# Patient Record
Sex: Female | Born: 1980
Health system: Southern US, Community
[De-identification: ages and names within clinical notes are randomized; demographics above are authoritative.]

## PROBLEM LIST (undated history)

## (undated) ENCOUNTER — Inpatient Hospital Stay (HOSPITAL_COMMUNITY): Payer: Self-pay

## (undated) DIAGNOSIS — I10 Essential (primary) hypertension: Secondary | ICD-10-CM

## (undated) DIAGNOSIS — E079 Disorder of thyroid, unspecified: Secondary | ICD-10-CM

## (undated) DIAGNOSIS — D219 Benign neoplasm of connective and other soft tissue, unspecified: Secondary | ICD-10-CM

## (undated) DIAGNOSIS — F410 Panic disorder [episodic paroxysmal anxiety] without agoraphobia: Secondary | ICD-10-CM

## (undated) DIAGNOSIS — A4902 Methicillin resistant Staphylococcus aureus infection, unspecified site: Secondary | ICD-10-CM

## (undated) DIAGNOSIS — Z87442 Personal history of urinary calculi: Secondary | ICD-10-CM

## (undated) DIAGNOSIS — E282 Polycystic ovarian syndrome: Secondary | ICD-10-CM

## (undated) DIAGNOSIS — E785 Hyperlipidemia, unspecified: Secondary | ICD-10-CM

## (undated) DIAGNOSIS — F32A Depression, unspecified: Secondary | ICD-10-CM

## (undated) DIAGNOSIS — K219 Gastro-esophageal reflux disease without esophagitis: Secondary | ICD-10-CM

## (undated) HISTORY — DX: Hyperlipidemia, unspecified: E78.5

## (undated) HISTORY — PX: HYSTEROSCOPY: SHX211

## (undated) HISTORY — PX: PARATHYROIDECTOMY: SHX19

## (undated) HISTORY — DX: Depression, unspecified: F32.A

## (undated) HISTORY — DX: Disorder of thyroid, unspecified: E07.9

---

## 1999-04-11 ENCOUNTER — Inpatient Hospital Stay (HOSPITAL_COMMUNITY): Admission: AD | Admit: 1999-04-11 | Discharge: 1999-04-11 | Payer: Self-pay | Admitting: *Deleted

## 2000-10-26 ENCOUNTER — Encounter: Payer: Self-pay | Admitting: Family Medicine

## 2000-10-26 ENCOUNTER — Encounter: Admission: RE | Admit: 2000-10-26 | Discharge: 2000-10-26 | Payer: Self-pay | Admitting: Family Medicine

## 2001-11-26 ENCOUNTER — Other Ambulatory Visit: Admission: RE | Admit: 2001-11-26 | Discharge: 2001-11-26 | Payer: Self-pay | Admitting: Family Medicine

## 2002-05-14 ENCOUNTER — Emergency Department (HOSPITAL_COMMUNITY): Admission: EM | Admit: 2002-05-14 | Discharge: 2002-05-14 | Payer: Self-pay | Admitting: Emergency Medicine

## 2002-11-12 ENCOUNTER — Emergency Department (HOSPITAL_COMMUNITY): Admission: EM | Admit: 2002-11-12 | Discharge: 2002-11-12 | Payer: Self-pay

## 2002-11-12 ENCOUNTER — Encounter: Payer: Self-pay | Admitting: Emergency Medicine

## 2003-04-01 ENCOUNTER — Encounter: Admission: RE | Admit: 2003-04-01 | Discharge: 2003-04-01 | Payer: Self-pay | Admitting: Family Medicine

## 2003-04-01 ENCOUNTER — Encounter: Payer: Self-pay | Admitting: Family Medicine

## 2003-04-25 ENCOUNTER — Emergency Department (HOSPITAL_COMMUNITY): Admission: EM | Admit: 2003-04-25 | Discharge: 2003-04-25 | Payer: Self-pay | Admitting: Emergency Medicine

## 2004-06-21 ENCOUNTER — Emergency Department (HOSPITAL_COMMUNITY): Admission: EM | Admit: 2004-06-21 | Discharge: 2004-06-21 | Payer: Self-pay | Admitting: Emergency Medicine

## 2005-09-13 ENCOUNTER — Emergency Department (HOSPITAL_COMMUNITY): Admission: EM | Admit: 2005-09-13 | Discharge: 2005-09-13 | Payer: Self-pay | Admitting: Family Medicine

## 2006-05-22 ENCOUNTER — Emergency Department (HOSPITAL_COMMUNITY): Admission: EM | Admit: 2006-05-22 | Discharge: 2006-05-22 | Payer: Self-pay | Admitting: Family Medicine

## 2006-06-27 ENCOUNTER — Emergency Department (HOSPITAL_COMMUNITY): Admission: EM | Admit: 2006-06-27 | Discharge: 2006-06-27 | Payer: Self-pay | Admitting: Family Medicine

## 2006-09-01 ENCOUNTER — Emergency Department (HOSPITAL_COMMUNITY): Admission: EM | Admit: 2006-09-01 | Discharge: 2006-09-01 | Payer: Self-pay | Admitting: Family Medicine

## 2006-10-05 ENCOUNTER — Emergency Department (HOSPITAL_COMMUNITY): Admission: EM | Admit: 2006-10-05 | Discharge: 2006-10-05 | Payer: Self-pay | Admitting: Emergency Medicine

## 2007-02-03 ENCOUNTER — Emergency Department (HOSPITAL_COMMUNITY): Admission: EM | Admit: 2007-02-03 | Discharge: 2007-02-03 | Payer: Self-pay | Admitting: Family Medicine

## 2007-04-18 ENCOUNTER — Ambulatory Visit (HOSPITAL_COMMUNITY): Admission: RE | Admit: 2007-04-18 | Discharge: 2007-04-18 | Payer: Self-pay | Admitting: Emergency Medicine

## 2007-04-18 ENCOUNTER — Emergency Department (HOSPITAL_COMMUNITY): Admission: EM | Admit: 2007-04-18 | Discharge: 2007-04-18 | Payer: Self-pay | Admitting: Emergency Medicine

## 2007-09-29 ENCOUNTER — Emergency Department (HOSPITAL_COMMUNITY): Admission: EM | Admit: 2007-09-29 | Discharge: 2007-09-29 | Payer: Self-pay | Admitting: Family Medicine

## 2007-10-21 ENCOUNTER — Ambulatory Visit (HOSPITAL_COMMUNITY): Admission: EM | Admit: 2007-10-21 | Discharge: 2007-10-23 | Payer: Self-pay | Admitting: Family Medicine

## 2007-10-22 ENCOUNTER — Encounter (HOSPITAL_BASED_OUTPATIENT_CLINIC_OR_DEPARTMENT_OTHER): Payer: Self-pay | Admitting: Internal Medicine

## 2008-03-07 ENCOUNTER — Inpatient Hospital Stay (HOSPITAL_COMMUNITY): Admission: AD | Admit: 2008-03-07 | Discharge: 2008-03-07 | Payer: Self-pay | Admitting: Gynecology

## 2008-04-02 ENCOUNTER — Emergency Department (HOSPITAL_COMMUNITY): Admission: EM | Admit: 2008-04-02 | Discharge: 2008-04-02 | Payer: Self-pay | Admitting: Emergency Medicine

## 2008-06-26 ENCOUNTER — Encounter: Admission: RE | Admit: 2008-06-26 | Discharge: 2008-06-26 | Payer: Self-pay | Admitting: Internal Medicine

## 2008-09-15 ENCOUNTER — Emergency Department (HOSPITAL_COMMUNITY): Admission: EM | Admit: 2008-09-15 | Discharge: 2008-09-15 | Payer: Self-pay | Admitting: Emergency Medicine

## 2008-10-14 ENCOUNTER — Encounter: Payer: Self-pay | Admitting: Gynecology

## 2008-10-14 ENCOUNTER — Other Ambulatory Visit: Admission: RE | Admit: 2008-10-14 | Discharge: 2008-10-14 | Payer: Self-pay | Admitting: Gynecology

## 2008-10-14 ENCOUNTER — Ambulatory Visit: Payer: Self-pay | Admitting: Gynecology

## 2008-10-16 ENCOUNTER — Ambulatory Visit: Payer: Self-pay | Admitting: Gynecology

## 2008-10-22 ENCOUNTER — Ambulatory Visit: Payer: Self-pay | Admitting: Gynecology

## 2008-11-13 ENCOUNTER — Ambulatory Visit (HOSPITAL_COMMUNITY): Admission: RE | Admit: 2008-11-13 | Discharge: 2008-11-13 | Payer: Self-pay | Admitting: Gynecology

## 2008-11-20 ENCOUNTER — Ambulatory Visit: Payer: Self-pay | Admitting: Gynecology

## 2008-12-14 ENCOUNTER — Ambulatory Visit: Payer: Self-pay | Admitting: Gynecology

## 2009-01-06 ENCOUNTER — Ambulatory Visit: Payer: Self-pay | Admitting: Gynecology

## 2009-01-14 ENCOUNTER — Ambulatory Visit: Payer: Self-pay | Admitting: Gynecology

## 2009-02-19 ENCOUNTER — Emergency Department (HOSPITAL_COMMUNITY): Admission: EM | Admit: 2009-02-19 | Discharge: 2009-02-19 | Payer: Self-pay | Admitting: Emergency Medicine

## 2009-02-28 ENCOUNTER — Ambulatory Visit: Payer: Self-pay | Admitting: Gynecology

## 2009-03-01 ENCOUNTER — Encounter (INDEPENDENT_AMBULATORY_CARE_PROVIDER_SITE_OTHER): Payer: Self-pay | Admitting: Obstetrics and Gynecology

## 2009-03-01 ENCOUNTER — Ambulatory Visit: Payer: Self-pay | Admitting: Gynecology

## 2009-03-01 ENCOUNTER — Ambulatory Visit (HOSPITAL_COMMUNITY): Admission: RE | Admit: 2009-03-01 | Discharge: 2009-03-01 | Payer: Self-pay | Admitting: Obstetrics and Gynecology

## 2009-06-09 ENCOUNTER — Emergency Department (HOSPITAL_COMMUNITY): Admission: EM | Admit: 2009-06-09 | Discharge: 2009-06-09 | Payer: Self-pay | Admitting: Emergency Medicine

## 2010-04-18 ENCOUNTER — Telehealth (INDEPENDENT_AMBULATORY_CARE_PROVIDER_SITE_OTHER): Payer: Self-pay

## 2010-05-27 ENCOUNTER — Emergency Department (HOSPITAL_COMMUNITY): Admission: EM | Admit: 2010-05-27 | Discharge: 2010-05-27 | Payer: Self-pay | Admitting: Family Medicine

## 2010-06-18 IMAGING — CT CT CERVICAL SPINE W/O CM
2 series · 10 of 14 positions shown, 12 images · non-contrast
Comparison: None

CLINICAL DATA: Tingling in both hands

CT CERVICAL SPINE WITHOUT CONTRAST
TECHNIQUE: Multidetector CT imaging of the cervical spine was
performed. Multiplanar CT image reconstructions were also
generated.

[Series 3: c_spine 2.0 b31s · axial · 0.34mm/px · z∈[-218,-100]mm · 5 of 89 slices shown]
[im 15/89  bone]
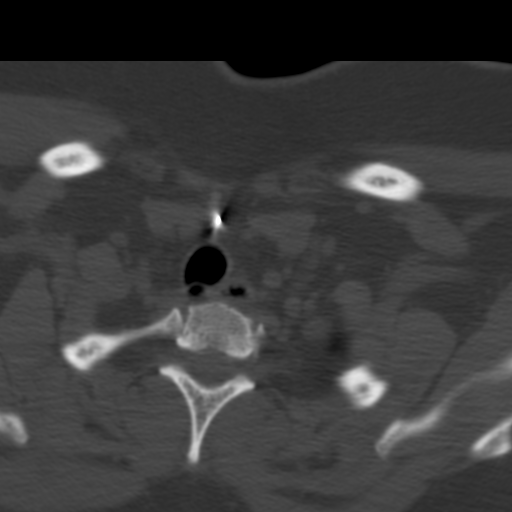
[im 30/89  bone]
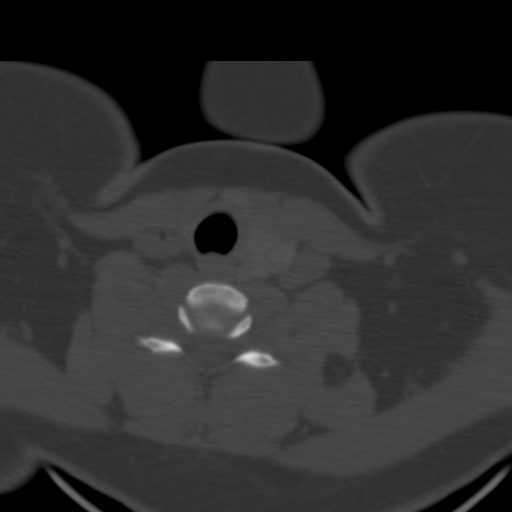
[im 45/89  bone]
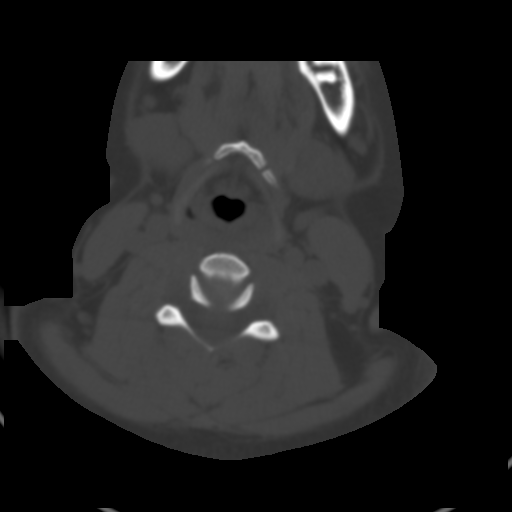
[im 59/89  bone]
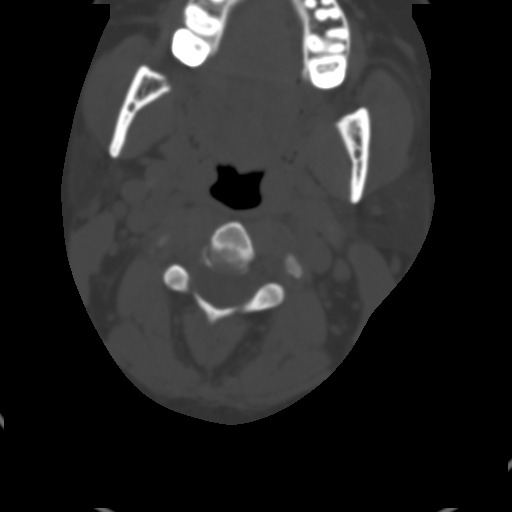
[im 74/89  bone]
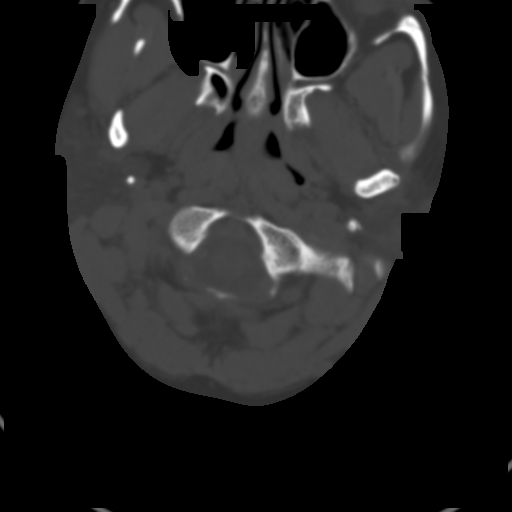

[Series 602: <mpr thick range> · axial · 0.35mm/px · z∈[-259,-136]mm · 5 of 97 slices shown, 7 images]
[im 17/97  soft-tissue]
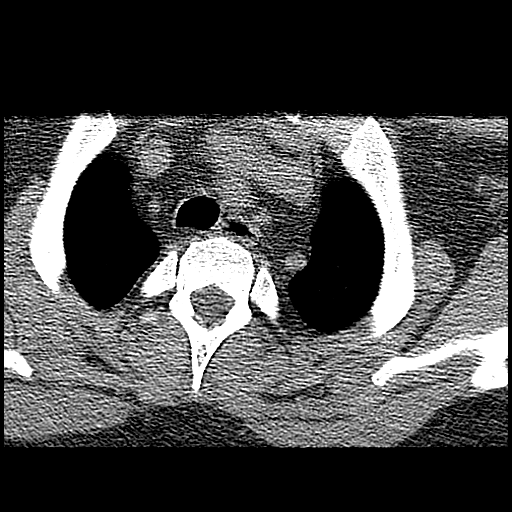
[im 17/97  bone]
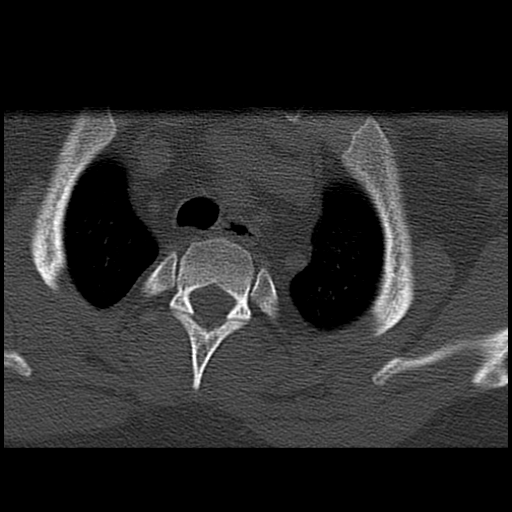
[im 33/97  bone]
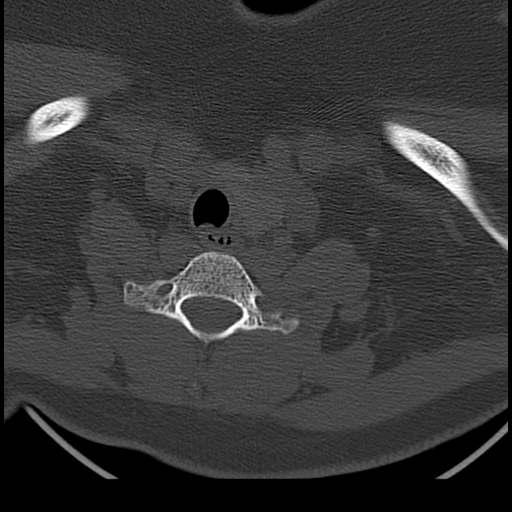
[im 49/97  bone]
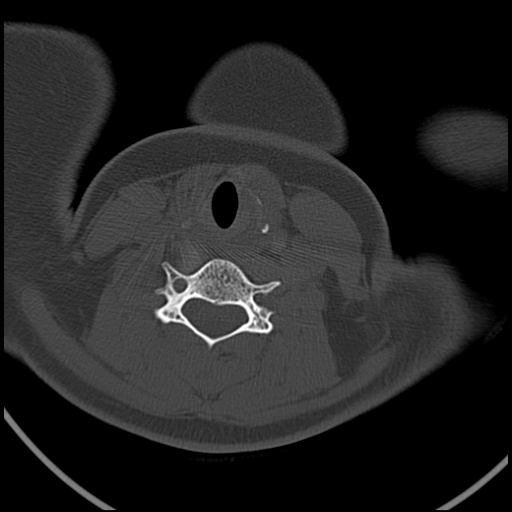
[im 65/97  bone]
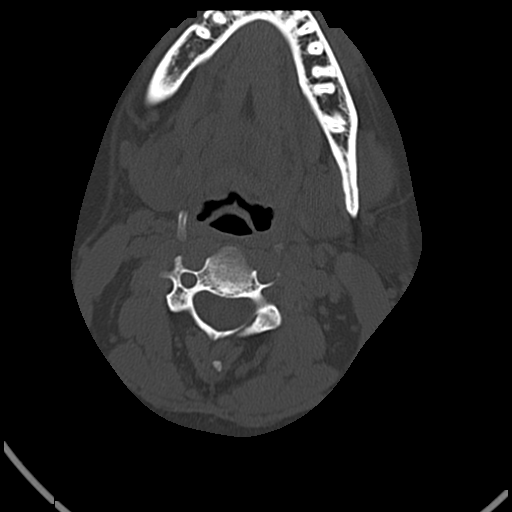
[im 81/97  soft-tissue]
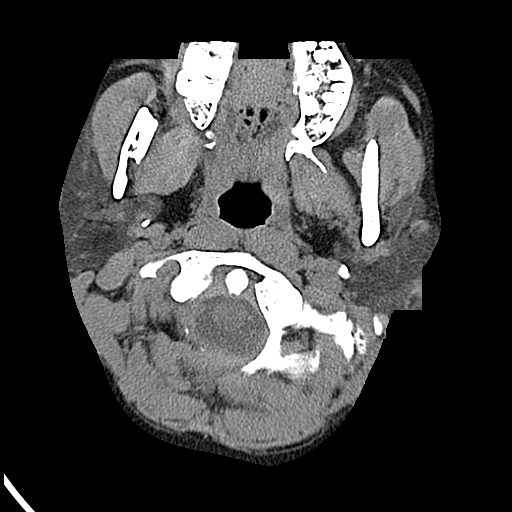
[im 81/97  bone]
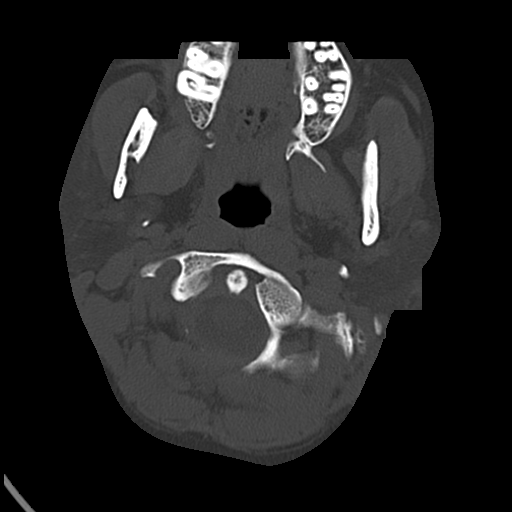

[10 of 14 positions shown; findings below may reference images not displayed]

FINDINGS: There is reversal of the normal cervical lordotic curve.
No fractures or subluxations.  No obvious acute disc herniation.
The prevertebral soft tissues normal except for prior thyroid
surgery.
IMPRESSION: No acute or significant findings.

## 2010-10-18 NOTE — Progress Notes (Signed)
Summary: triage  Phone Note Call from Patient Call back at [phone] (home)   [phone] (cell)   Caller: Patient Call For: Dr. Russella Dar (doctor of the day) Reason for Call: Talk to Nurse Summary of Call: left side abd pain... only wants to see Amy... no GI hx... try cell first, ok to leave a msg Initial call taken by: Vallarie Mare,  April 18, 2010 11:59 AM  Follow-up for Phone Call        Patient  c/o LUQ pain.  patient requesting to only see Amy Esterwood PA .  I explained that patient can see Mike Gip PA the first time, but will then be scheduled with an MD for follow up appointments and that [facility] PA will not be her permanent provider here Follow-up by: Darcey Nora RN, CGRN,  April 18, 2010 12:10 PM

## 2010-12-01 LAB — URINE CULTURE
Colony Count: 15000
Culture  Setup Time: 201109100115

## 2010-12-01 LAB — POCT URINALYSIS DIPSTICK
Glucose, UA: NEGATIVE mg/dL
Ketones, ur: NEGATIVE mg/dL
Specific Gravity, Urine: 1.025 (ref 1.005–1.030)
Urobilinogen, UA: 1 mg/dL (ref 0.0–1.0)

## 2010-12-01 LAB — POCT PREGNANCY, URINE: Preg Test, Ur: POSITIVE

## 2010-12-23 LAB — URINALYSIS, ROUTINE W REFLEX MICROSCOPIC
Bilirubin Urine: NEGATIVE
Glucose, UA: NEGATIVE mg/dL
Hgb urine dipstick: NEGATIVE
Ketones, ur: NEGATIVE mg/dL
Protein, ur: NEGATIVE mg/dL
pH: 7 (ref 5.0–8.0)

## 2010-12-23 LAB — POCT I-STAT, CHEM 8
BUN: 7 mg/dL (ref 6–23)
Calcium, Ion: 1.23 mmol/L (ref 1.12–1.32)
Chloride: 106 mEq/L (ref 96–112)
Creatinine, Ser: 0.8 mg/dL (ref 0.4–1.2)
Glucose, Bld: 100 mg/dL — ABNORMAL HIGH (ref 70–99)
TCO2: 26 mmol/L (ref 0–100)

## 2010-12-26 LAB — URINALYSIS, ROUTINE W REFLEX MICROSCOPIC
Glucose, UA: NEGATIVE mg/dL
Hgb urine dipstick: NEGATIVE
Ketones, ur: NEGATIVE mg/dL
Protein, ur: NEGATIVE mg/dL
pH: 7.5 (ref 5.0–8.0)

## 2010-12-26 LAB — POCT PREGNANCY, URINE: Preg Test, Ur: NEGATIVE

## 2010-12-26 LAB — CBC
HCT: 39 % (ref 36.0–46.0)
Hemoglobin: 13.4 g/dL (ref 12.0–15.0)
MCHC: 34.3 g/dL (ref 30.0–36.0)
RBC: 4.73 MIL/uL (ref 3.87–5.11)
RDW: 14.1 % (ref 11.5–15.5)

## 2010-12-27 ENCOUNTER — Inpatient Hospital Stay (HOSPITAL_COMMUNITY)
Admission: AD | Admit: 2010-12-27 | Discharge: 2010-12-30 | DRG: 651 | Disposition: A | Discharge status: Home / Self Care | Payer: Federal, State, Local not specified - PPO | Source: Ambulatory Visit | Attending: Obstetrics and Gynecology | Admitting: Obstetrics and Gynecology

## 2010-12-27 DIAGNOSIS — I10 Essential (primary) hypertension: Secondary | ICD-10-CM | POA: Diagnosis present

## 2010-12-27 DIAGNOSIS — IMO0002 Reserved for concepts with insufficient information to code with codable children: Principal | ICD-10-CM | POA: Diagnosis present

## 2010-12-27 LAB — CBC
HCT: 33.3 % — ABNORMAL LOW (ref 36.0–46.0)
MCH: 27.6 pg (ref 26.0–34.0)
MCV: 81.4 fL (ref 78.0–100.0)
Platelets: 181 10*3/uL (ref 150–400)
RBC: 4.09 MIL/uL (ref 3.87–5.11)
WBC: 10.6 10*3/uL — ABNORMAL HIGH (ref 4.0–10.5)

## 2010-12-27 LAB — COMPREHENSIVE METABOLIC PANEL
Alkaline Phosphatase: 89 U/L (ref 39–117)
BUN: 4 mg/dL — ABNORMAL LOW (ref 6–23)
Chloride: 107 mEq/L (ref 96–112)
Creatinine, Ser: 0.51 mg/dL (ref 0.4–1.2)
GFR calc non Af Amer: >60 mL/min (ref >60)
Glucose, Bld: 87 mg/dL (ref 70–99)
Potassium: 3.7 mEq/L (ref 3.5–5.1)
Total Bilirubin: 0.5 mg/dL (ref 0.3–1.2)

## 2010-12-27 LAB — LACTATE DEHYDROGENASE: LDH: 152 U/L (ref 94–250)

## 2010-12-27 LAB — URIC ACID: Uric Acid, Serum: 5.1 mg/dL (ref 2.4–7.0)

## 2010-12-28 ENCOUNTER — Other Ambulatory Visit: Payer: Self-pay | Admitting: Obstetrics and Gynecology

## 2010-12-28 LAB — RPR: RPR Ser Ql: NONREACTIVE

## 2010-12-28 LAB — CBC
MCH: 27.7 pg (ref 26.0–34.0)
MCHC: 34.1 g/dL (ref 30.0–36.0)
Platelets: 181 10*3/uL (ref 150–400)
RDW: 13.7 % (ref 11.5–15.5)

## 2010-12-29 LAB — CBC
MCV: 82.4 fL (ref 78.0–100.0)
Platelets: 186 10*3/uL (ref 150–400)
RBC: 3.8 MIL/uL — ABNORMAL LOW (ref 3.87–5.11)
RDW: 13.8 % (ref 11.5–15.5)
WBC: 9.8 10*3/uL (ref 4.0–10.5)

## 2010-12-30 ENCOUNTER — Encounter (HOSPITAL_COMMUNITY)
Admission: RE | Admit: 2010-12-30 | Discharge: 2010-12-30 | Disposition: A | Discharge status: Home / Self Care | Payer: Federal, State, Local not specified - PPO | Source: Ambulatory Visit | Attending: Obstetrics and Gynecology | Admitting: Obstetrics and Gynecology

## 2010-12-30 DIAGNOSIS — O923 Agalactia: Secondary | ICD-10-CM | POA: Insufficient documentation

## 2011-01-02 ENCOUNTER — Ambulatory Visit (HOSPITAL_COMMUNITY)
Admission: RE | Admit: 2011-01-02 | Discharge: 2011-01-02 | Disposition: A | Discharge status: Home / Self Care | Payer: Federal, State, Local not specified - PPO | Source: Ambulatory Visit | Attending: Obstetrics and Gynecology | Admitting: Obstetrics and Gynecology

## 2011-01-02 DIAGNOSIS — O923 Agalactia: Secondary | ICD-10-CM | POA: Insufficient documentation

## 2011-01-04 ENCOUNTER — Inpatient Hospital Stay (HOSPITAL_COMMUNITY)
Admission: AD | Admit: 2011-01-04 | Discharge: 2011-01-05 | Disposition: A | Discharge status: Home / Self Care | Payer: Federal, State, Local not specified - PPO | Source: Ambulatory Visit | Attending: Obstetrics and Gynecology | Admitting: Obstetrics and Gynecology

## 2011-01-04 DIAGNOSIS — O9989 Other specified diseases and conditions complicating pregnancy, childbirth and the puerperium: Secondary | ICD-10-CM

## 2011-01-04 DIAGNOSIS — R51 Headache: Secondary | ICD-10-CM

## 2011-01-04 DIAGNOSIS — O99893 Other specified diseases and conditions complicating puerperium: Secondary | ICD-10-CM

## 2011-01-04 LAB — URINE MICROSCOPIC-ADD ON

## 2011-01-04 LAB — CBC
HCT: 33.7 % — ABNORMAL LOW (ref 36.0–46.0)
Hemoglobin: 11.3 g/dL — ABNORMAL LOW (ref 12.0–15.0)
MCV: 81.2 fL (ref 78.0–100.0)
RBC: 4.15 MIL/uL (ref 3.87–5.11)
RDW: 13 % (ref 11.5–15.5)
WBC: 8.8 10*3/uL (ref 4.0–10.5)

## 2011-01-04 LAB — URINALYSIS, ROUTINE W REFLEX MICROSCOPIC
Bilirubin Urine: NEGATIVE
Nitrite: NEGATIVE
Specific Gravity, Urine: 1.01 (ref 1.005–1.030)
Urobilinogen, UA: 0.2 mg/dL (ref 0.0–1.0)

## 2011-01-04 NOTE — H&P (Signed)
  Debbie Walter, Debbie Walter                ACCOUNT NO.:  [id number]  MEDICAL RECORD NO.:  [id number]           PATIENT TYPE:  I  LOCATION:  9169                          [facility]ACILITY[facility]  [facility]  PHYSICIAN:  Dineen Kid. Rana Snare, M.D.    DATE OF BIRTH:  09-05-1981  DATE OF ADMISSION:  12/27/2010 DATE OF DISCHARGE:                             HISTORY & PHYSICAL   HISTORY OF PRESENT ILLNESS:  Ms. Mable is a 30 year old G3, A2, P0.  She is 24 and [redacted] weeks  gestational age based on the [facility] of January 15, 2011. She was seen by my partner Dr. Marcelle Overlie today for a routine obstetrical care.  Pregnancy has been complicated by elevated blood pressures including chronic hypertension currently on methyldopa, but has recently had worsening increased blood pressures.  Today in the office, blood pressure 170/100, recheck was 158/98.  She also has 1+ protein in the urine which is only for her.  We planned to admit her for induction of labor due to preeclampsia versus most likely superimposed preeclampsia on top of chronic hypertension.  She had an ultrasound today showing a vertex fetus with a biophysical profile 8/8.  AFI was 90th percentile with an AFI of 23.2.  estimated fetal weight was 7.9 which is 85th percentile.  Again, her blood pressure 170/100.  Fetal heart rates in the 140s.  Abdomen is 37-cm.  Cervix is fingertip, 50%, - 2.  Reflexes are 204 bilaterally with trace edema.  PAST MEDICAL HISTORY:  History of hyperparathyroidism, status post hyperparathyroid removal, history of chronic hypertension currently on methyldopa, history of panic attacks.  MEDICATIONS:  Currently, she is on Protonix 40 mg daily.  She is also on metformin at 500 mg a day.  She is on methyldopa 250 mg p.o. b.i.d.  She is on prenatal vitamins, vitamin D and calcium.  Pregnancy care unremarkable.  She did have routine ultrasounds which were normal including normal first trimester screen.  She is group B strep positive and she has no  known drug allergies.  IMPRESSION AND PLAN:  Intrauterine pregnancy at 37-1/7 weeks' with chronic hypertension with superimposed preeclampsia.  We will check preeclampsia labs and begin induction of labor.  Plan two-stage induction with antibiotics for group B strep positive.  Anticipate spontaneous vaginal delivery.     Dineen Kid Rana Snare, M.D.     DCL/MEDQ  D:  12/27/2010  T:  12/28/2010  Job:  [id number]  Electronically Signed by Candice Camp M.D. on 01/04/2011 01:25:34 PM

## 2011-01-05 LAB — COMPREHENSIVE METABOLIC PANEL
ALT: 28 U/L (ref 0–35)
Alkaline Phosphatase: 72 U/L (ref 39–117)
BUN: 11 mg/dL (ref 6–23)
CO2: 26 mEq/L (ref 19–32)
Chloride: 105 mEq/L (ref 96–112)
Glucose, Bld: 102 mg/dL — ABNORMAL HIGH (ref 70–99)
Potassium: 3.9 mEq/L (ref 3.5–5.1)
Sodium: 140 mEq/L (ref 135–145)
Total Bilirubin: 0.4 mg/dL (ref 0.3–1.2)
Total Protein: 6.4 g/dL (ref 6.0–8.3)

## 2011-01-05 LAB — LACTATE DEHYDROGENASE: LDH: 226 U/L (ref 94–250)

## 2011-01-05 NOTE — Op Note (Signed)
  Debbie Walter, Debbie Walter                ACCOUNT NO.:  [id number]  MEDICAL RECORD NO.:  [id number]           PATIENT TYPE:  I  LOCATION:  9147                          [facility]  [facility]  PHYSICIAN:  Crimson Beer L. Cassity Christian, M.D.DATE OF BIRTH:  02-Aug-1981  DATE OF PROCEDURE:  12/28/2010 DATE OF DISCHARGE:                              OPERATIVE REPORT   PREOPERATIVE DIAGNOSES: 1. Intrauterine pregnancy at 38 plus weeks. 2. Chronic hypertension. 3. Failure to progress.  POSTOPERATIVE DIAGNOSES: 1. Intrauterine pregnancy at 38 plus weeks. 2. Chronic hypertension. 3. Failure to progress.  PROCEDURE:  Primary low transverse cesarean section.  SURGEON:  Norwood Quezada L. Kamerin Axford, MD  ANESTHESIA:  Epidural.  FINDINGS:  Female infant, cephalic presentation in transverse position, Apgars 9 at 1 minute and 9 at 5 minutes.  ESTIMATED BLOOD LOSS:  Less than 500 mL.  PATHOLOGY:  None.  DRAINS:  Foley.  COMPLICATIONS:  None.  DESCRIPTION OF PROCEDURE:  The patient was taken to the operating room. Her epidural was dosed.  She was then prepped and draped.  A Foley catheter had been inserted while in Labor and Delivery.  A low transverse incision was made, carried down to the fascia.  The fascia was scored in the midline and extended laterally.  The rectus muscles were separated in the midline.  The peritoneum was entered bluntly.  The peritoneal incision was then stretched.  The lower uterine segment was identified.  The bladder flap was created sharply and then digitally. The bladder blade was then readjusted.  A low transverse incision was made in the uterus.  Uterus was entered using a hemostat.  Amniotic fluid was clear.  The baby was in transverse position and was delivered easily with a female infant, Apgars 9 at 1 minute and 9 at 5 minutes. The cord was clamped and cut.  The baby was handed to the awaiting pediatricians and the uterus was exteriorized and cleared of all clots and debris after  the placenta was manually removed and noted to be normal, intact with a 3-vessel cord.  The uterus was closed in 1 layer using 0 chromic in a running locked stitch.  Hemostasis was excellent. The uterus was returned to the abdomen.  Irrigation was performed. Hemostasis was excellent.  The peritoneum and rectus muscles were reapproximated using 0 Vicryl.  The fascia was closed using 0 Vicryl, starting each corner and meeting in the midline.  After irrigation of subcutaneous layer, the skin was closed with a 4-0 Vicryl on a Keith needle.  Dermabond and local was infiltrated.  All sponge, lap, and instrument counts were correct x2.  The patient went to recovery room in stable condition.     Jahmeir Geisen L. Vincente Poli, M.D.     Florestine Avers  D:  12/28/2010  T:  12/29/2010  Job:  [id number]  Electronically Signed by Marcelle Overlie M.D. on 01/05/2011 06:18:23 PM

## 2011-01-15 ENCOUNTER — Inpatient Hospital Stay (HOSPITAL_COMMUNITY): Admission: AD | Admit: 2011-01-15 | Payer: Self-pay | Admitting: Obstetrics and Gynecology

## 2011-01-25 NOTE — Discharge Summary (Signed)
Debbie Walter, VERNE                ACCOUNT NO.:  [id number]  MEDICAL RECORD NO.:  [id number]           PATIENT TYPE:  LOCATION:                                 FACILITY:  PHYSICIAN:  Duke Salvia. Marcelle Overlie, M.D.DATE OF BIRTH:  September 27, 1980  DATE OF ADMISSION:  12/28/2010 DATE OF DISCHARGE:  12/30/2010                              DISCHARGE SUMMARY   ADMITTING DIAGNOSIS: 1. Intrauterine pregnancy at 37-2/7 weeks' estimated gestational age. 2. Two-stage induction of labor secondary to chronic hypertension.  DISCHARGE DIAGNOSIS:  1. Status post low transverse cesarean section secondary to failed induction 2. viable female infant.  PROCEDURE:  Primary low transverse cesarean section.  REASON FOR ADMISSION:  Please see written H and P.  HOSPITAL COURSE:  The patient is a 30 year old African American married female, gravida 3, para 0 that was admitted to [facility] for two-stage induction of labor.  The patient did have known chronic hypertension and over the last several weeks blood pressures were noted to be elevated.  The patient was now admitted for Cytotec and Pitocin augmentation of her labor.  After several hours with no change in cervix, decision was made to proceed with a primary low transverse cesarean section.  The patient was then taken to the operating room where epidural was dosed to an adequate surgical level.  A low transverse incision was made with delivery of a viable female infant, weighing 7 pounds 5 ounces with Apgars of 9 at 1 minute and 9 at 5 minutes.  The patient tolerated the procedure well and taken to the recovery room in stable condition.  On postoperative day #1, the patient was without complaint.  She denied headache or blurred vision.  Vital signs were stable.  She is afebrile.  Blood pressure is 135/89 and 145/82, baseline blood pressure is 140/92.  Deep tendon reflexes are 2+. No clonus.  Abdomen soft.  Fundus, firm and nontender.   Incision was clean, dry, and intact with Dermabond closure.  Foley had been discontinued and she is voiding well.  Laboratory findings showed hemoglobin of 10.4 and platelet count of 186,000.  On postoperative day #2, the patient desired early discharge.  Vital signs were stable. Fundus, firm and nontender.  Incision was clean, dry, and intact. Discharge instructions were reviewed and the patient was later discharged home.  CONDITION ON DISCHARGE:  Stable.  DIET:  Regular as tolerated.  ACTIVITY:  No heavy lifting, no driving x2 weeks, no vaginal entry.  FOLLOWUP:  The patient is to follow up in the office in 1-2 weeks for an incision check.  She is to call for temperature greater than 100 degrees, persistent nausea, vomiting, heavy vaginal bleeding, and/or redness or drainage from the incisional site.  DISCHARGE MEDICATIONS: 1. Tylox, #30, one p.o. q.4-6 h. p.r.n. 2. Motrin 600 mg every 6 hours. 3. Prenatal vitamins 1 p.o. daily. 4. Aldomet 250 mg one p.o. b.i.d.     Julio Sicks, N.P.   ______________________________ Duke Salvia. Marcelle Overlie, M.D.    CC/MEDQ  D:  12/30/2010  T:  12/30/2010  Job:  [id number]  Electronically Signed by Julio Sicks  N.P. on 01/04/2011 11:06:55 AM Electronically Signed by Richarda Overlie M.D. on 01/25/2011 09:25:34 AM

## 2011-01-31 NOTE — Op Note (Signed)
NAMEMARA, FAVERO                ACCOUNT NO.:  [id number]   MEDICAL RECORD NO.:  [id number]          PATIENT TYPE:  AMB   LOCATION:  SDC                           [facility]  [facility]   PHYSICIAN:  Juan H. Lily Peer, M.D.DATE OF BIRTH:  09/25/1980   DATE OF PROCEDURE:  DATE OF DISCHARGE:  03/01/2009                               OPERATIVE REPORT   SURGEON:  Juan H. Lily Peer, MD   PREOPERATIVE DIAGNOSES:  1. Endometrial polyps.  2. Secondary infertility.   ANESTHESIA:  General endotracheal anesthesia.   PROCEDURES PERFORMED:  Resectoscopic polypectomy and resectoscopic  myomectomy.   FINDINGS:  The patient with multiple submucous myomas and polyps, both  tubal ostia were identified after distending the uterus with 3%  sorbitol, endocervical canal unremarkable.   INDICATIONS FOR OPERATION:  A 30 year old gravida 1, para 0, AB 1 with a  history of secondary infertility and workup had demonstrated evidence of  endometrial polyps and submucous myoma.  Case had been discussed with  reproductive endocrinologist, Dr. Fermin Schwab at a Reproductive  Endocrinology at [facility] and the patient was scheduled to  undergo endometrial resectoscopic polypectomy and submucous myomectomy  by Dr. Reynaldo Minium and HSG and [facility] catheterization, both fallopian  tube by Dr. April Manson, which will be in a separate dictation.   DESCRIPTION OF OPERATION:  After the patient was adequately counseled,  she was taken to the operating room where she underwent a successful  general endotracheal anesthesia.  She had received a gram of cefotetan  for prophylaxis.  Her vagina and perineum were prepped and draped in the  usual sterile fashion.  Of note, a laminaria that had been placed  intracervically the day before was removed in an effort to facilitate  insertion of the operative resectoscope.  A speculum was placed into the  vagina.  A single-tooth tenaculum was placed into the anterior  cervical  lip.  The cervix required no cervical dilatation.  The uterus sounded to  approximately 6.5 cm.  The Lendell Caprice operative resectoscope with a 90-  degree wire loop was inserted into the intrauterine cavity and 3%  sorbitol as a distending media.  In the systematic fashion, I inspected  the entire cavity, multiple polyps and a few submucous myomas were  noted.  The [place] electrosurgical generator was set at 80 watts from  the cutting mode and 80 watts from the coagulation mode, and  systematically the submucous myomas and polyps were resected, and this  was interchanged with vigorous curetting as well as the suction curette  to clear the cavity of the endometrial polyps and submucosa myomas.  This allowed for Dr. April Manson to proceed with the [facility] catheterization  of both fallopian tube under fluoroscopy and HSG to determine patency of  the fallopian tubes, please see separate dictation.  The patient  tolerated the procedure well.  Blood loss was less than 20 mL and fluid  deficit was less than 1000 mL,  although several 100 mL were on the operating room floor.  The patient  was extubated, transferred to recovery room with stable vital signs.  Blood loss was minimal and fluid resuscitation consisted of 1600 mL of  lactated Ringer's.  She did receive a gram of cefotetan for prophylaxis.      Juan H. Lily Peer, M.D.  Electronically Signed     JHF/MEDQ  D:  03/01/2009  T:  03/02/2009  Job:  [id number]

## 2011-01-31 NOTE — H&P (Signed)
Debbie Walter, Debbie Walter                ACCOUNT NO.:  [id number]   MEDICAL RECORD NO.:  [id number]          PATIENT TYPE:  INP   LOCATION:  6526                         FACILITY:  [facility]   PHYSICIAN:  Barry Dienes. Eloise Harman, M.D.DATE OF BIRTH:  1981/08/05   DATE OF ADMISSION:  10/21/2007  DATE OF DISCHARGE:  10/23/2007                              HISTORY & PHYSICAL   CHIEF COMPLAINT:  Palpitations and I have C difficile.   HISTORY OF PRESENT ILLNESS:  The patient is a 30 year old African  American woman who is well-known to me.  She has no history of cardiac  or pulmonary disease.  She has a history of hypertension, panic attacks,  gastroesophageal reflux disease, and partial  parathyroidectomy.   She was in her usual state of good health until approximately 5 p.m.  yesterday when she developed fever with a gradual onset of tachycardia  and anxiety.  She also complained of frequent loose bowel movement since  yesterday afternoon with substernal chest tightness and shortness of  breath.  Today she continues to feel somewhat fatigued, but she no  longer has shortness of breath.  Of note, she had amoxicillin treatment  for presumed sinusitis prescribed by an urgent care center approximately  3 weeks ago.   PAST MEDICAL HISTORY:  1. Hypertension.  2. Partial parathyroidectomy in 1997 for primary hyperparathyroidism.  3. Gastroesophageal reflux disease associated with atypical chest      pain.  4. Panic disorder.  5. Common migraine headaches.   MEDICATIONS:  Prior to admission:  1. Toprol XL 50 mg p.o. daily.  2. Multivitamin 1 tablet daily.  3. Tums 2 tablets p.o. daily.  4. Vitamin D one tablet p.o. daily.  5. Colace 200 mg p.o. daily p.r.n. constipation.  6. Protonix 40 mg p.o. daily.  7. Fish oil one cap p.o. daily.  8. Folate 1 mg p.o. daily   ALLERGIES AND MEDICATION INTOLERANCES:  ZITHROMAX has been associated  with GI upset.   PAST SURGICAL HISTORY:  1997, partial  parathyroidectomy.   SOCIAL HISTORY:  She is married (to [place]).  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  She has no history of tobacco or alcohol  abuse.   FAMILY HISTORY:  Her father is age 23 and has hypertension and has had  colon cancer.  Her mother is age 29 and has diabetes mellitus, type 2  with premature coronary artery disease and hypertension.  She has a  sister age 75.  She has no children.  There is a family history of close  relatives with premature coronary artery disease, diabetes mellitus type  2, and colon cancer, but no close relatives with breast cancer.   REVIEW OF SYSTEMS:  She has had intermittent fevers for the past 24  hours with loose bowel movements.  She has not had a productive cough or  dysuria or frequency or arthralgias.  Her initial moderate anxiety has  decreased now.  Initially she had perioral and fingertip numbness  bilaterally that has resolved.  She denies substernal chest pain.   PHYSICAL EXAMINATION:  VITAL SIGNS:  Blood pressure 134/72, pulse 142,  respirations 18, temperature 100.2.  Currently her pulse is 107 with  telemetry showing sinus tachycardia.  Pulse oxygen saturation was 100%  on room air.  GENERAL:  In general, she is a well-nourished, well-developed black  female who is in no apparent distress.  HEENT:  Within normal limits.  NECK:  Supple without jugular venous distention or carotid bruits.  CHEST:  Clear to auscultation.  HEART:  Regular rate and rhythm without significant murmur or gallop.  ABDOMEN:  Abdomen had normal bowel sounds and no hepatosplenomegaly or  tenderness.  NEUROLOGIC:  She is alert and oriented x3 with a normal affect and a  nonfocal neurologic exam.   LABORATORY STUDIES:  Fibrin degradation products 0.8 (less than 0.48).  Troponin I was less than 0.05.  Urinalysis was normal.  Serum sodium  141, potassium 3.6, chloride 112, BUN 13, creatinine 0.8, glucose 125.  Urine drug screen was  normal.  EKG showed the following:  Sinus  tachycardia, right axis deviation, nonspecific T-wave abnormalities.  Hemoglobin 15, hematocrit 45.  White blood cell count was pending at the  time of dictation.   IMPRESSION AND PLAN:  1. Sinus tachycardia:  This is most likely secondary to dehydration      from diarrhea and fever, and appears to be exacerbated by a panic      attack.  I doubt that her rapid heart rate is due to      hyperthyroidism or left ventricular systolic dysfunction.  A CT      scan with angiography of the chest showed no evidence of pulmonary      embolism so this is unlikely.  I plan to continue IV fluids at a      moderate dose today and obtain a transthoracic echocardiogram to      assess LV function.  We will recheck cardiac isoenzymes in the      morning.  2. Fever and diarrhea:  This may be due to early Clostridium difficile      colitis.  There has been an epidemic of viral gastroenteritis in      the community.  I plan to check stool specimens for Clostridium      difficile x2.  We will check a CBC in the a.m. and if the white      blood cell count is elevated, consider and empiric treatment for      Clostridium difficile.  3. Anterior mediastinal fullness seen on CT scan of the chest:      Unclear etiology and could be due to substernal goiter or thymus      remnant.  Lymphoma is possible but somewhat less likely.   When the CT images are available, we will review the images and official  report.  She may need a follow-up CT scan to evaluate for interval  change.           ______________________________  Barry Dienes. Eloise Harman, M.D.     DGP/MEDQ  D:  10/22/2007  T:  10/23/2007  Job:  [id number]

## 2011-01-31 NOTE — Op Note (Signed)
Debbie Walter, Debbie Walter                ACCOUNT NO.:  [id number]   MEDICAL RECORD NO.:  [id number]          PATIENT TYPE:  AMB   LOCATION:  SDC                           [facility]   PHYSICIAN:  Fermin Schwab, MD   DATE OF BIRTH:  1981-08-26   DATE OF PROCEDURE:  03/01/2009  DATE OF DISCHARGE:  03/01/2009                               OPERATIVE REPORT   PREOPERATIVE DIAGNOSES:  1. Endometrial polyps.  2. Submucosal myoma.  3. Bilateral proximal tubal occlusion.   POSTOPERATIVE DIAGNOSES:  1. Endometrial polyps.  2. Submucosal myoma.  3. Bilateral proximal tubal occlusion.   PROCEDURE:  1. Hysteroscopic resection of polyps and myomas, dilation and      curettage, performed by Dr. Reynaldo Minium.  2. Hysteroscopy, Novy catheterization of bilateral fallopian tubes,      intraoperative hysterosalpingography, performed by Dr. April Manson.   ANESTHESIA:  General.   COMPLICATIONS:  None.   ESTIMATED BLOOD LOSS:  20 mL.   FINDINGS:  Numerous endometrial polyps filled the endometrial cavity.  There was also an approximately 1 x 2 cm submucosal myoma, probably  located at the 3 o'clock position.  This part was dictated by Dr.  Lily Peer.  After the D and C, endometrial cavity could be discerned  better.  Both tubal ostia were visualized and were nonpatent.  Initial  intraoperative hysterosalpingogram revealed bilateral tubal occlusion  even following the D and C with selective tubal cannulation.  Both  fallopian tubes opacified on intraoperative HSG.  However, postprocedure  transcervical injection of contrast still revealed a nonpatency of the  bilateral proximal portions of the tube although this may have been due  to artifact.   DESCRIPTION OF PROCEDURE:  The first part of the procedure was performed  by Dr. Lily Peer and was dictated separately.  After removal of polyps  and myomas and suction and sharp D and C, an operative hysteroscope was  inserted into the endometrial  cavity using sorbitol as distention  medium.  The cavity was almost normal with some fluffy tissue versus  residual submucosal myoma or polyp apparent at the 3 o'clock position.  The cervix was clamped to achieve a better seal around 5 mm operative  hysteroscope.  The Novy catheter was directed into the right tubal  ostium and wedged into it.  Selective salpingography performed with  contrast medium did not show any opacification of the right tube.  Next,  a cornual access catheter with a guidewire was passed into the proximal  segment of the tube, and this was confirmed with intraoperative HSG,  since inability to maintain uterine distention, was not making it  possible to monitor the proper passage of the catheter into the tube via  the hysteroscope.  The guidewire was pulled out and contrast solution  was injected showing the right tubal patency.  The same steps were  repeated for the proximal portion of the left tube after selective  salpingography attempt on this side failed to show opacification of the  tube.  After tubal catheterization, the left tube also showed filling  and spillage.  The catheter  was pulled out.  Using a pediatric Foley  catheter with a balloon wedged into the lower uterine segment, a  transcervical HSG was repeated showing a sluggish fill or non-fill of  the bilateral tubes status post catheterization.  This may have been due  to an artifact.  The procedure was terminated.  Estimated blood loss was  20 mL.  The hysteroscopic sorbitol deficit was about 700 mL.  The  patient tolerated the procedure well and was transferred to recovery  room in satisfactory condition.  She will continue her oral  contraceptives and we will start clomiphene 50 mg, and she will come  back for her postop check and a transvaginal ultrasound in 12 days.      Fermin Schwab, MD  Electronically Signed     TY/MEDQ  D:  03/03/2009  T:  03/03/2009  Job:  [id number]   cc:   Gaetano Hawthorne.  Lily Peer, M.D.  Fax: [phone]

## 2011-01-31 NOTE — H&P (Signed)
Debbie Walter, Debbie Walter                ACCOUNT NO.:  [id number]   MEDICAL RECORD NO.:  [id number]          PATIENT TYPE:  EMS   LOCATION:  ED                           FACILITY:  [facility]   PHYSICIAN:  Juan H. Lily Peer, M.D.DATE OF BIRTH:  1981/07/24   DATE OF ADMISSION:  02/19/2009  DATE OF DISCHARGE:  02/19/2009                              HISTORY & PHYSICAL   The patient was scheduled for surgery on Monday at 11:00 a.m. at [facility].  Please have history and physical available.   CHIEF COMPLAINT:  1. Endometrial polyps.  2. Secondary infertility.   HISTORY:  The patient is a 30 year old gravida 1, para 0, AB 1 who has  had an extensive evaluation for her secondary infertility.  Her workup  has included normal prolactin, normal TSH, normal fasting blood sugar,  normal fasting lipid profile, total testosterone slightly in upper  limits of normal.  This had been done as a result of her hirsutism/PCO.  The DHEAS was normal as was her insulin level and 17-  hydroxyprogesterone.  Her vitamin D level was slightly low at 23.  She  has past history of partial correction of parathyroidectomy in 1997.  She was recently placed on 50,000 units vitamin D weekly for 12 weeks.  Her husband's semen analysis had been normal with the exception of being  extremely viscous.  She had a hysterosalpingogram which demonstrated  numerous fixed filling defects seen in the intrauterine cavity  compressing most of the endometrial cavity.  Neither fallopian tube  filled because of the filling defects in the both cornual regions.  She  was seen in the office on January 06, 2009, where by the ultrasound  demonstrated a uterus measuring 8.4 x 5.6 x 4.9 cm and a posterior myoma  measuring 22 x 14 mm.  Endometrium was thick with numerous echogenic  defects with positive color Doppler.  Right and left ovaries were with  several follicles, prolactin appearance consistent with PCO.  The  sonohysterogram  demonstrated numerous echogenic defects with positive  color flow with the following measuring 13 mm, 14 mm, 17 mm, 15 mm, and  6 mm.  Anterior to the myometrium, was a thickness of the wall and  defect measuring 5 mm and posterior 6 mm.  The case had been discussed  with Dr. Fermin Schwab, head of Reproductive Endocrinology at [facility], and he recommended to proceed with resectoscopic  polypectomy and possible submucous myomectomy, and he would concurrently  do bilateral intraoperative fluoroscopy to see if we can recanalize the  tubes and reestablish the patency.  He had put her on oral contraceptive  pill for the month prior to the procedure and when she would be on the  placebo part of the oral contraceptive pill, we would be doing the  procedure.  He had also had placed her on metformin which she had  difficulty tolerating.   PAST MEDICAL HISTORY:  She has had one prior miscarriage.  She also has  history of hypertension for which she is taking metoprolol and  gastroesophageal reflux for which she  is taking Protonix.  She is taking  calcium with vitamin D and vitamin B12.  She had history of  parathyroidectomy in 1997.   PHYSICAL EXAMINATION:  VITAL SIGNS:  The patient weighs 186 pounds, she  is 5 feet 1 inch tall, blood pressure 134/90.  HEENT:  Unremarkable.  NECK:  Supple.  Trachea midline.  No carotid bruits or thyromegaly.  LUNGS:  Clear to auscultation without rhonchi or wheezes.  HEART:  Regular rate and rhythm.  No murmurs or gallops.  BREASTS:  Unremarkable, done at the time of her annual exam in January  2010.  ABDOMEN:  Pendulous.  PELVIC:  Bartholin, urethra, Skene glands within normal limits.  Vagina  and cervix, no gross lesions on inspection.  Uterus, upper limits of  normal.  No palpable mass or tenderness.  RECTAL:  Deferred.   ASSESSMENT:  A 30 year old gravida 1, para 0, AB 1 with secondary  infertility numerous intracavitary defect such as  submucous myoma and  possible polyps and nonpatent tubes on hysterosalpingography.  The  patient is scheduled to undergo resectoscopic polypectomy, possible  submucous myomectomy along with bilateral intraoperative fluoroscopy.  The case will be done by Dr. Reynaldo Minium and Dr. Fermin Schwab.  The risks, benefits, and pros and cons of the operation were discussed  with the patient to include infection and she will receive prophylaxis  antibiotics.  The risk for uterine perforation or extravasation of the  staining media fluid contribute to pulmonary edema and hyponatremia were  discussed with the patient.  She wished to have a laminaria placed  intracervically the day prior to her surgery to facilitate insertion of  the operative resectoscope.  The patient fully understands all the  above.  All questions were answered and will follow accordingly.  The  patient scheduled for hysteroscopy D&C, resectoscopic myomectomy, and  polypectomy along with bilateral intraoperative fluoroscopy on Monday,  March 01, 2009, at 11:00 a.m. at [facility]  Please have history  and physical available.      Juan H. Lily Peer, M.D.  Electronically Signed     JHF/MEDQ  D:  02/26/2009  T:  02/27/2009  Job:  [id number]

## 2011-02-03 NOTE — Discharge Summary (Signed)
NAMEKOURTNEI, RAUBER                ACCOUNT NO.:  [id number]   MEDICAL RECORD NO.:  [id number]          PATIENT TYPE:  OIB   LOCATION:  6526                         FACILITY:  [facility]   PHYSICIAN:  Barry Dienes. Eloise Harman, M.D.DATE OF BIRTH:  12-02-1980   DATE OF ADMISSION:  10/21/2007  DATE OF DISCHARGE:  10/23/2007                               DISCHARGE SUMMARY   PERTINENT FINDINGS:  The patient is a 30 year old African American woman  who is well-known to me.  She she has no history of cardiac or pulmonary  disease.  She has a history of hypertension, panic attacks,  gastroesophageal reflux disease, and a partial parathyroidectomy.  She  was in her usual state of good health until approximately 5:00 p.m.  yesterday when she developed fever with a gradual onset of tachycardia  and anxiety.  She also had frequent loose bowel movements and mild  substernal chest tightness and shortness of breath.  She was seen in the  emergency room with an extensive workup and started on IV fluids and  anti-emetics.  At the time of my evaluation.  She felt somewhat  fatigued, but no longer had shortness of breath.  Of note, she had had  amoxicillin treatment for presumed sinusitis for side prescribed by an  urgent care center approximately 3 weeks prior to hospital admission.   MEDICATIONS PRIOR TO ADMISSION:  1. Toprol XL 50 mg daily.  2. Multivitamin 1 tablet daily.  3. Tums 2 tablets p.o. daily.  4. Vitamin B iron 1 tablet p.o. daily.  5. Colace 200 mg daily p.r.n. constipation.  6. Protonix 40 mg p.o. daily.  7. Fish oil 1 tablet p.o. daily, folate 1 mg p.o. daily.   ALLERGIES/MEDICATION INTOLERANCES:  Zithromax has been associated with  GI upset.   PHYSICAL EXAMINATION:  VITAL SIGNS:  Blood pressure 134/72, pulse 142,  respirations 18, temperature 100.2.  At the time of my evaluation.  Her  pulse was 107 with telemetry showed sinus tachycardia.  Pulse oxygen  saturation was 100% on room air. In  general she is a well-nourished,  well-developed black female who is in no apparent distress.  HEENT: Exam was within normal limits.  NECK:  Neck was supple without jugular venous distention or carotid  bruit.  CHEST: Clear to auscultation.  Heart had a regular rate and rhythm  without significant murmur or gallop.  ABDOMEN: Normal bowel sounds and no hepatosplenomegaly or tenderness.  NEUROLOGICAL EXAM:  She is alert and oriented x3 and had a normal  affect.  A nonfocal neurologic exam.   LABORATORY STUDIES:  Serum sodium 141, potassium 3.6, chloride 112, BUN  13, creatinine 0.8, glucose 125.  Urine drug screen was normal. Fibrin  degradation products was 0.8 (normal is less than 0.48), troponin-I was  less than 0.05.  Urinalysis was normal EKG showed the following:  Sinus  tachycardia, right axis deviation, nonspecific T-wave abnormalities.  Hemoglobin 15, hematocrit 45, white blood cell count pending.   HOSPITAL COURSE:  The patient was admitted to a medical bed with  telemetry.  She had a CT angiography of  a chest exam done for chest pain  with an elevated D-dimer.  This study showed no gross pulmonary  embolism.  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  However,  the contours were somewhat prominent for a typical residual thymus, so a  interval follow-up CT scan examination was recommended an roughly 6  months.  She had also had a transthoracic echocardiogram done to  evaluate her tachycardia.  The echocardiogram showed normal left  ventricular systolic function with left ventricular ejection fraction  between 60 and 70%.  There was systolic mitral bowing of the anterior  and posterior leaflets, but without diagnostic evidence for mitral valve  prolapse.  A stool test for C difficile was negative.  Gradually her  nausea and  tachycardia resolved entirely.   PROCEDURES:  CT angiogram of the chest and transthoracic echocardiogram.   CONSULTATIONS:  None.   CONDITION ON DISCHARGE:  Her diarrhea had resolved entirely.  She was  eating well with no abdominal pain.  She had no shortness of breath or  further palpitations.  Most recent vital signs include blood pressure  118/52, pulse 94, respirations 20, temperature 97.9, pulse oxygen  saturation 99% on room air.  Chest was clear to auscultation.  Heart had  a regular rate and rhythm.  Abdomen was benign and extremities were  without edema.  Most recent laboratory tests included troponin I 0.01,  serum sodium 137, potassium 3.7, chloride 114, carbon dioxide 19, BUN 2,  creatinine 0.61, glucose 86, calcium 7.8.  Stool C.  Difficile toxin  negative, TSH 1.4, white blood cells 5.7, hemoglobin 12, hematocrit 36,  platelets 224.   DISCHARGE DIAGNOSES:  1. Acute viral gastroenteritis.  2. Hypertension, essential, controlled.  3. Gastroesophageal reflux disease.  4. History of chronic constipation.  5. Osteoarthritis.  6. Anxiety.   DISCHARGE MEDICATIONS:  1. Toprol XL 50 mg p.o. daily.  2. Protonix 40 mg p.o. daily.  3. Colace 100 mg p.o. daily p.r.n. constipation.  4. Folate 1 mg p.o. daily.  5. Calcium carbonate 500 mg p.o. daily.  6. Tylenol 650 mg p.o. t.i.d. p.r.n. pain.  7. Xanax 0.25 mg p.o. b.i.d. p.r.n. anxiety, #20.  8. Imodium AD p.r.n. diarrhea, Kaopectate 1 tablespoon p.o. b.i.d. for      7 days.   DISPOSITION:  Follow-up:  The patient was discharged to home.  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  She was advised to have a follow-up appoint with Dr.  Jarome Matin in 7-14 days following discharge at [facility].           ______________________________  Barry Dienes Eloise Harman, M.D.     DGP/MEDQ  D:  11/27/2007  T:  11/29/2007  Job:  [id number]

## 2011-06-09 LAB — I-STAT 8, (EC8 V) (CONVERTED LAB)
Acid-base deficit: 5 — ABNORMAL HIGH
BUN: 13
Chloride: 112
HCT: 45
Potassium: 3.6
pH, Ven: 7.382 — ABNORMAL HIGH

## 2011-06-09 LAB — BASIC METABOLIC PANEL
BUN: 2 — ABNORMAL LOW
GFR calc Af Amer: >60
GFR calc non Af Amer: >60
Potassium: 3.7
Sodium: 137

## 2011-06-09 LAB — URINALYSIS, ROUTINE W REFLEX MICROSCOPIC
Bilirubin Urine: NEGATIVE
Glucose, UA: NEGATIVE
Ketones, ur: 15 — AB
pH: 8.5 — ABNORMAL HIGH

## 2011-06-09 LAB — CBC
HCT: 35.8 — ABNORMAL LOW
Hemoglobin: 12.4
Platelets: 224
RBC: 4.29
WBC: 5.7

## 2011-06-09 LAB — RAPID URINE DRUG SCREEN, HOSP PERFORMED
Amphetamines: NOT DETECTED
Benzodiazepines: NOT DETECTED
Cocaine: NOT DETECTED
Tetrahydrocannabinol: NOT DETECTED

## 2011-06-09 LAB — POCT CARDIAC MARKERS
Myoglobin, poc: 49.1
Operator id: 277751

## 2011-06-09 LAB — CLOSTRIDIUM DIFFICILE EIA
C difficile Toxins A+B, EIA: NEGATIVE
C difficile Toxins A+B, EIA: NEGATIVE

## 2011-06-09 LAB — T3, FREE: T3, Free: 3.1 (ref 2.3–4.2)

## 2011-06-09 LAB — POCT I-STAT CREATININE: Creatinine, Ser: 0.8

## 2011-06-15 LAB — URINE CULTURE

## 2011-06-15 LAB — URINALYSIS, ROUTINE W REFLEX MICROSCOPIC
Hgb urine dipstick: NEGATIVE
Protein, ur: NEGATIVE
Urobilinogen, UA: 0.2

## 2011-06-15 LAB — GC/CHLAMYDIA PROBE AMP, GENITAL: GC Probe Amp, Genital: NEGATIVE

## 2011-06-15 LAB — WET PREP, GENITAL
Trich, Wet Prep: NONE SEEN
Yeast Wet Prep HPF POC: NONE SEEN

## 2011-06-16 LAB — URINALYSIS, ROUTINE W REFLEX MICROSCOPIC
Glucose, UA: NEGATIVE
Hgb urine dipstick: NEGATIVE
Ketones, ur: NEGATIVE
Protein, ur: NEGATIVE

## 2011-06-16 LAB — PREGNANCY, URINE: Preg Test, Ur: NEGATIVE

## 2011-06-22 LAB — POCT I-STAT, CHEM 8
BUN: 11 mg/dL (ref 6–23)
Calcium, Ion: 1.21 mmol/L (ref 1.12–1.32)
Glucose, Bld: 98 mg/dL (ref 70–99)
TCO2: 20 mmol/L (ref 0–100)

## 2011-06-22 LAB — URINE MICROSCOPIC-ADD ON

## 2011-06-22 LAB — DIFFERENTIAL
Basophils Absolute: 0 10*3/uL (ref 0.0–0.1)
Eosinophils Relative: 0 % (ref 0–5)
Lymphocytes Relative: 16 % (ref 12–46)
Monocytes Absolute: 0.3 10*3/uL (ref 0.1–1.0)
Monocytes Relative: 3 % (ref 3–12)

## 2011-06-22 LAB — CBC
HCT: 38.5 % (ref 36.0–46.0)
Hemoglobin: 13 g/dL (ref 12.0–15.0)
MCHC: 33.7 g/dL (ref 30.0–36.0)
RDW: 14.1 % (ref 11.5–15.5)

## 2011-06-22 LAB — URINALYSIS, ROUTINE W REFLEX MICROSCOPIC
Bilirubin Urine: NEGATIVE
Leukocytes, UA: NEGATIVE
Nitrite: NEGATIVE
Specific Gravity, Urine: 1.009 (ref 1.005–1.030)
Urobilinogen, UA: 0.2 mg/dL (ref 0.0–1.0)
pH: 8.5 — ABNORMAL HIGH (ref 5.0–8.0)

## 2011-06-22 LAB — PREGNANCY, URINE: Preg Test, Ur: NEGATIVE

## 2011-06-22 LAB — POCT CARDIAC MARKERS

## 2011-07-03 LAB — CBC
HCT: 41.1
MCV: 82.9
Platelets: 340
RBC: 4.96
WBC: 8.9

## 2011-07-03 LAB — URINALYSIS, ROUTINE W REFLEX MICROSCOPIC
Bilirubin Urine: NEGATIVE
Glucose, UA: NEGATIVE
Ketones, ur: NEGATIVE
Protein, ur: NEGATIVE
pH: 7

## 2011-07-03 LAB — COMPREHENSIVE METABOLIC PANEL
AST: 20
Albumin: 4.1
Alkaline Phosphatase: 50
BUN: 9
CO2: 24
Chloride: 106
Creatinine, Ser: 0.64
GFR calc Af Amer: >60
GFR calc non Af Amer: >60
Potassium: 3.5
Total Bilirubin: 0.5

## 2011-07-03 LAB — URINE MICROSCOPIC-ADD ON

## 2011-07-03 LAB — DIFFERENTIAL
Basophils Absolute: 0.2 — ABNORMAL HIGH
Basophils Relative: 2 — ABNORMAL HIGH
Eosinophils Relative: 1
Lymphocytes Relative: 16
Monocytes Absolute: 0.4

## 2011-07-03 LAB — LIPASE, BLOOD: Lipase: 19

## 2011-09-27 ENCOUNTER — Encounter (HOSPITAL_COMMUNITY): Payer: Self-pay | Admitting: *Deleted

## 2011-09-27 ENCOUNTER — Emergency Department (HOSPITAL_COMMUNITY)
Admission: EM | Admit: 2011-09-27 | Discharge: 2011-09-27 | Disposition: A | Discharge status: Home / Self Care | Payer: Federal, State, Local not specified - PPO | Attending: Emergency Medicine | Admitting: Emergency Medicine

## 2011-09-27 ENCOUNTER — Emergency Department (HOSPITAL_COMMUNITY): Payer: Federal, State, Local not specified - PPO

## 2011-09-27 DIAGNOSIS — R071 Chest pain on breathing: Secondary | ICD-10-CM | POA: Insufficient documentation

## 2011-09-27 DIAGNOSIS — F410 Panic disorder [episodic paroxysmal anxiety] without agoraphobia: Secondary | ICD-10-CM

## 2011-09-27 DIAGNOSIS — F411 Generalized anxiety disorder: Secondary | ICD-10-CM | POA: Insufficient documentation

## 2011-09-27 DIAGNOSIS — R0789 Other chest pain: Secondary | ICD-10-CM

## 2011-09-27 DIAGNOSIS — R002 Palpitations: Secondary | ICD-10-CM | POA: Insufficient documentation

## 2011-09-27 DIAGNOSIS — R42 Dizziness and giddiness: Secondary | ICD-10-CM | POA: Insufficient documentation

## 2011-09-27 HISTORY — DX: Panic disorder (episodic paroxysmal anxiety): F41.0

## 2011-09-27 HISTORY — DX: Essential (primary) hypertension: I10

## 2011-09-27 MED ORDER — LORAZEPAM 2 MG/ML IJ SOLN
1.0000 mg | Freq: Once | INTRAMUSCULAR | Status: AC
  Administered 2011-09-27: 1 mg via INTRAVENOUS
  Filled 2011-09-27: qty 1

## 2011-09-27 MED ORDER — KETOROLAC TROMETHAMINE 30 MG/ML IJ SOLN
30.0000 mg | Freq: Once | INTRAMUSCULAR | Status: AC
  Administered 2011-09-27: 30 mg via INTRAVENOUS
  Filled 2011-09-27: qty 1

## 2011-09-27 MED ORDER — ALPRAZOLAM 0.25 MG PO TABS: ORAL_TABLET | ORAL | Status: DC

## 2011-09-27 MED ORDER — SODIUM CHLORIDE 0.9 % IV SOLN
INTRAVENOUS | Status: DC
  Administered 2011-09-27: 10:00:00 via INTRAVENOUS

## 2011-09-27 NOTE — ED Provider Notes (Signed)
History     CSN: [id number]  Arrival date & time 09/27/11  3244   First MD Initiated Contact with Patient 09/27/11 [phone]      Chief Complaint  Patient presents with  . Panic Attack    (Consider location/radiation/quality/duration/timing/severity/associated sxs/prior treatment) The history is provided by the patient.   the patient is a 31 year old, female, with a history of panic attacks, and hypertension.  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  She was giving report and developed palpitations, lightheadedness, and shortness of breath, paresthesias in her hands.  She denies nausea, vomiting.  Denies abdominal pain, back pain, urinary tract symptoms.  She does not use a lot of caffeinated beverages.  She has not used any decongestants recently, and she denies recent illnesses  Past Medical History  Diagnosis Date  . Panic attacks   . Hypertension     Past Surgical History  Procedure Date  . Parathyroidectomy   . Cesarean section     No family history on file.  History  Substance Use Topics  . Smoking status: Never Smoker   . Smokeless tobacco: Not on file  . Alcohol Use: No    OB History    Grav Para Term Preterm Abortions TAB SAB Ect Mult Living                  Review of Systems  Constitutional: Negative for fever and chills.  Respiratory: Positive for chest tightness and shortness of breath. Negative for cough.   Cardiovascular: Positive for palpitations. Negative for leg swelling.  Gastrointestinal: Negative for abdominal pain.  Neurological: Positive for light-headedness and numbness. Negative for headaches.  Psychiatric/Behavioral: Negative for confusion.  All other systems reviewed and are negative.    Allergies  Labetalol; Procardia; and Zithromax  Home Medications   Current Outpatient Rx  Name Route Sig Dispense Refill  . ALPRAZOLAM 0.25 MG PO TABS Oral Take 0.25 mg by mouth 3 (three) times daily as needed. Anxitey/panic attack    . CALCIUM CARBONATE-VITAMIN  D 250-125 MG-UNIT PO TABS Oral Take 1 tablet by mouth daily.    Marland Kitchen METOPROLOL TARTRATE 25 MG PO TABS Oral Take 25 mg by mouth 2 (two) times daily.    Marland Kitchen PANTOPRAZOLE SODIUM 40 MG PO TBEC Oral Take 40 mg by mouth daily.    Marland Kitchen PRENATAL MULTIVITAMIN CH Oral Take 1 tablet by mouth daily.      BP 156/90  Pulse 73  Temp(Src) 97.6 F (36.4 C) (Oral)  Resp 20  SpO2 100%  LMP 09/06/2011  Physical Exam  Constitutional: She is oriented to person, place, and time. She appears well-developed and well-nourished.       Anxious hyperventilating  HENT:  Head: Normocephalic and atraumatic.  Eyes: Pupils are equal, round, and reactive to light.  Neck: Normal range of motion.  Cardiovascular: Normal rate, regular rhythm and normal heart sounds.   No murmur heard. Pulmonary/Chest: Breath sounds normal. No respiratory distress. She has no wheezes. She has no rales.       Hyperventilating  Abdominal: Soft. She exhibits no distension and no mass. There is no tenderness. There is no rebound and no guarding.  Musculoskeletal: Normal range of motion. She exhibits no edema and no tenderness.  Neurological: She is alert and oriented to person, place, and time. No cranial nerve deficit.  Skin: Skin is warm and dry. No rash noted. No erythema.  Psychiatric: Her behavior is normal. Her mood appears anxious.    ED Course  Procedures (  including critical care time)  31 year old, female, with a history of panic attacks, presents with a recurrence.  Today, while at work.  No signs of systemic illness.  No indication for testing today.  We will establish an IV and give her Ativan and monitor.  Her. Labs Reviewed - No data to display No results found.   No diagnosis found.  Pt had complained of left sided cp under her breast.  i examined her with rn chaperone.  + left rib cage ttp. No breast or cw masses. cxr neg.    sxs resolved after toradol and ativan  MDM  Panic attack - resolved. Chest wall pain -  resolved.        Nicholes Stairs, MD 09/27/11 1135

## 2011-09-27 NOTE — ED Notes (Signed)
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 "I felt like I was going to faint"; RN that came with pt states "she has xanax but that hasn't helped, she still feels like she can't get her breath, so we thought we should bring her down here for a little help"

## 2011-09-27 NOTE — ED Notes (Signed)
Pt here for s/p panic attack she stated that she has   they just happen  on occ.denies triggering factors she was receiving report this am and 'it happens' t

## 2011-10-24 ENCOUNTER — Encounter (HOSPITAL_COMMUNITY): Payer: Self-pay | Admitting: *Deleted

## 2011-10-24 ENCOUNTER — Inpatient Hospital Stay (HOSPITAL_COMMUNITY)
Admission: AD | Admit: 2011-10-24 | Discharge: 2011-10-24 | Disposition: A | Discharge status: Home / Self Care | Payer: Federal, State, Local not specified - PPO | Source: Ambulatory Visit | Attending: Obstetrics and Gynecology | Admitting: Obstetrics and Gynecology

## 2011-10-24 DIAGNOSIS — O00109 Unspecified tubal pregnancy without intrauterine pregnancy: Secondary | ICD-10-CM | POA: Insufficient documentation

## 2011-10-24 HISTORY — DX: Benign neoplasm of connective and other soft tissue, unspecified: D21.9

## 2011-10-24 LAB — CBC
HCT: 39.1 % (ref 36.0–46.0)
Hemoglobin: 13.2 g/dL (ref 12.0–15.0)
MCHC: 33.8 g/dL (ref 30.0–36.0)

## 2011-10-24 LAB — HCG, QUANTITATIVE, PREGNANCY: hCG, Beta Chain, Quant, S: 182 m[IU]/mL — ABNORMAL HIGH (ref <5)

## 2011-10-24 LAB — CREATININE, SERUM
Creatinine, Ser: 0.63 mg/dL (ref 0.50–1.10)
GFR calc Af Amer: >90 mL/min (ref >90)
GFR calc non Af Amer: >90 mL/min (ref >90)

## 2011-10-24 LAB — DIFFERENTIAL
Basophils Relative: 0 % (ref 0–1)
Monocytes Absolute: 0.8 10*3/uL (ref 0.1–1.0)
Monocytes Relative: 6 % (ref 3–12)
Neutro Abs: 8.8 10*3/uL — ABNORMAL HIGH (ref 1.7–7.7)

## 2011-10-24 MED ORDER — METHOTREXATE INJECTION FOR WOMEN'S HOSPITAL
50.0000 mg/m2 | Freq: Once | INTRAMUSCULAR | Status: AC
  Administered 2011-10-24: 95 mg via INTRAMUSCULAR
  Filled 2011-10-24: qty 1.9

## 2011-10-24 NOTE — Progress Notes (Signed)
Pt states, " I was sent over by Dr Vincente Poli for MTX. I just out today that I have an ectopic pregnancy."

## 2011-12-13 ENCOUNTER — Emergency Department (HOSPITAL_COMMUNITY): Payer: Federal, State, Local not specified - PPO

## 2011-12-13 ENCOUNTER — Encounter (HOSPITAL_COMMUNITY): Payer: Self-pay

## 2011-12-13 ENCOUNTER — Emergency Department (HOSPITAL_COMMUNITY)
Admission: EM | Admit: 2011-12-13 | Discharge: 2011-12-13 | Disposition: A | Discharge status: Home / Self Care | Payer: Federal, State, Local not specified - PPO | Attending: Emergency Medicine | Admitting: Emergency Medicine

## 2011-12-13 ENCOUNTER — Other Ambulatory Visit: Payer: Self-pay

## 2011-12-13 DIAGNOSIS — R1013 Epigastric pain: Secondary | ICD-10-CM | POA: Insufficient documentation

## 2011-12-13 DIAGNOSIS — I10 Essential (primary) hypertension: Secondary | ICD-10-CM | POA: Insufficient documentation

## 2011-12-13 DIAGNOSIS — F419 Anxiety disorder, unspecified: Secondary | ICD-10-CM

## 2011-12-13 DIAGNOSIS — F411 Generalized anxiety disorder: Secondary | ICD-10-CM | POA: Insufficient documentation

## 2011-12-13 DIAGNOSIS — R000 Tachycardia, unspecified: Secondary | ICD-10-CM | POA: Insufficient documentation

## 2011-12-13 DIAGNOSIS — K219 Gastro-esophageal reflux disease without esophagitis: Secondary | ICD-10-CM | POA: Insufficient documentation

## 2011-12-13 MED ORDER — SUCRALFATE 1 G PO TABS: 1.0000 g | ORAL_TABLET | Freq: Four times a day (QID) | ORAL | Status: DC

## 2011-12-13 MED ORDER — FAMOTIDINE 20 MG PO TABS
40.0000 mg | ORAL_TABLET | Freq: Once | ORAL | Status: AC
  Administered 2011-12-13: 40 mg via ORAL
  Filled 2011-12-13 (×2): qty 1

## 2011-12-13 MED ORDER — PANTOPRAZOLE SODIUM 20 MG PO TBEC: 20.0000 mg | DELAYED_RELEASE_TABLET | Freq: Every day | ORAL | Status: DC

## 2011-12-13 MED ORDER — GI COCKTAIL ~~LOC~~
30.0000 mL | Freq: Once | ORAL | Status: AC
Start: 2011-12-13 — End: 2011-12-13
  Administered 2011-12-13: 30 mL via ORAL
  Filled 2011-12-13: qty 30

## 2011-12-13 NOTE — Discharge Instructions (Signed)
Anxiety and Panic Attacks Your caregiver has informed you that you are having an anxiety or panic attack. There may be many forms of this. Most of the time these attacks come suddenly and without warning. They come at any time of day, including periods of sleep, and at any time of life. They may be strong and unexplained. Although panic attacks are very scary, they are physically harmless. Sometimes the cause of your anxiety is not known. Anxiety is a protective mechanism of the body in its fight or flight mechanism. Most of these perceived danger situations are actually nonphysical situations (such as anxiety over losing a job). CAUSES  The causes of an anxiety or panic attack are many. Panic attacks may occur in otherwise healthy people given a certain set of circumstances. There may be a genetic cause for panic attacks. Some medications may also have anxiety as a side effect. SYMPTOMS  Some of the most common feelings are:  Intense terror.   Dizziness, feeling faint.   Hot and cold flashes.   Fear of going crazy.   Feelings that nothing is real.   Sweating.   Shaking.   Chest pain or a fast heartbeat (palpitations).   Smothering, choking sensations.   Feelings of impending doom and that death is near.   Tingling of extremities, this may be from over-breathing.   Altered reality (derealization).   Being detached from yourself (depersonalization).  Several symptoms can be present to make up anxiety or panic attacks. DIAGNOSIS  The evaluation by your caregiver will depend on the type of symptoms you are experiencing. The diagnosis of anxiety or panic attack is made when no physical illness can be determined to be a cause of the symptoms. TREATMENT  Treatment to prevent anxiety and panic attacks may include:  Avoidance of circumstances that cause anxiety.   Reassurance and relaxation.   Regular exercise.   Relaxation therapies, such as yoga.   Psychotherapy with a  psychiatrist or therapist.   Avoidance of caffeine, alcohol and illegal drugs.   Prescribed medication.  SEEK IMMEDIATE MEDICAL CARE IF:   You experience panic attack symptoms that are different than your usual symptoms.   You have any worsening or concerning symptoms.  Document Released: 09/04/2005 Document Revised: 08/24/2011 Document Reviewed: 01/06/2010 Exit[employer] Patient Information 2012 [employer].Gastroesophageal Reflux Disease, Adult Gastroesophageal reflux disease (GERD) happens when acid from your stomach flows up into the esophagus. When acid comes in contact with the esophagus, the acid causes soreness (inflammation) in the esophagus. Over time, GERD may create small holes (ulcers) in the lining of the esophagus. CAUSES   Increased body weight. This puts pressure on the stomach, making acid rise from the stomach into the esophagus.   Smoking. This increases acid production in the stomach.   Drinking alcohol. This causes decreased pressure in the lower esophageal sphincter (valve or ring of muscle between the esophagus and stomach), allowing acid from the stomach into the esophagus.   Late evening meals and a full stomach. This increases pressure and acid production in the stomach.   A malformed lower esophageal sphincter.  Sometimes, no cause is found. SYMPTOMS   Burning pain in the lower part of the mid-chest behind the breastbone and in the mid-stomach area. This may occur twice a week or more often.   Trouble swallowing.   Sore throat.   Dry cough.   Asthma-like symptoms including chest tightness, shortness of breath, or wheezing.  DIAGNOSIS  Your caregiver may be able to   diagnose GERD based on your symptoms. In some cases, X-rays and other tests may be done to check for complications or to check the condition of your stomach and esophagus. TREATMENT  Your caregiver may recommend over-the-counter or prescription medicines to help decrease acid production. Ask  your caregiver before starting or adding any new medicines.  HOME CARE INSTRUCTIONS   Change the factors that you can control. Ask your caregiver for guidance concerning weight loss, quitting smoking, and alcohol consumption.   Avoid foods and drinks that make your symptoms worse, such as:   Caffeine or alcoholic drinks.   Chocolate.   Peppermint or mint flavorings.   Garlic and onions.   Spicy foods.   Citrus fruits, such as oranges, lemons, or limes.   Tomato-based foods such as sauce, chili, salsa, and pizza.   Fried and fatty foods.   Avoid lying down for the 3 hours prior to your bedtime or prior to taking a nap.   Eat small, frequent meals instead of large meals.   Wear loose-fitting clothing. Do not wear anything tight around your waist that causes pressure on your stomach.   Raise the head of your bed 6 to 8 inches with wood blocks to help you sleep. Extra pillows will not help.   Only take over-the-counter or prescription medicines for pain, discomfort, or fever as directed by your caregiver.   Do not take aspirin, ibuprofen, or other nonsteroidal anti-inflammatory drugs (NSAIDs).  SEEK IMMEDIATE MEDICAL CARE IF:   You have pain in your arms, neck, jaw, teeth, or back.   Your pain increases or changes in intensity or duration.   You develop nausea, vomiting, or sweating (diaphoresis).   You develop shortness of breath, or you faint.   Your vomit is green, yellow, black, or looks like coffee grounds or blood.   Your stool is red, bloody, or black.  These symptoms could be signs of other problems, such as heart disease, gastric bleeding, or esophageal bleeding. MAKE SURE YOU:   Understand these instructions.   Will watch your condition.   Will get help right away if you are not doing well or get worse.  Document Released: 06/14/2005 Document Revised: 08/24/2011 Document Reviewed: 03/24/2011 Exit[employer] Patient Information 2012 [employer]. 

## 2011-12-13 NOTE — ED Notes (Signed)
Pt resting quietly on stretcher with family at bedside; pt becoming slightly anxious after explaining ingredients in GI cocktail - states she feels as if she can not breathe, pt grasping chest, sitting upright on stretcher immediately after drinking GI cocktail; reassured pt that she was OK and to try to rest on stretcher; resp remain even, nonlabored, o2 sats 100% RA

## 2011-12-13 NOTE — ED Provider Notes (Signed)
History     CSN: [id number]  Arrival date & time 12/13/11  1552   First MD Initiated Contact with Patient 12/13/11 1622      Chief Complaint  Patient presents with  . Anxiety    hx of anxiety; increased stressors x2-3 days; also endorses abd pain     (Consider location/radiation/quality/duration/timing/severity/associated sxs/prior treatment) Patient is a 31 y.o. female presenting with anxiety. The history is provided by the patient.  Anxiety   patient presents with anxiety attack prior to arrival. Patient has x-rays epigastric pain but worse after she had [place] food today. She can get very nervous and she took Xanax without relief. Denies any severe pleuritic chest pain or leg swelling. History of multiple panic attacks the past. Patient began to have carpal pedal spasm and called EMS. She states now she feels much better. Denies any leg pain or swelling. No SI or depression recently.  Past Medical History  Diagnosis Date  . Panic attacks   . Hypertension   . Fibroids     Past Surgical History  Procedure Date  . Parathyroidectomy   . Cesarean section   . Parathyroidectomy   . Hysteroscopy     Family History  Problem Relation Age of Onset  . Anesthesia problems Neg Hx   . Hypotension Neg Hx   . Malignant hyperthermia Neg Hx   . Pseudochol deficiency Neg Hx     History  Substance Use Topics  . Smoking status: Never Smoker   . Smokeless tobacco: Not on file  . Alcohol Use: No    OB History    Grav Para Term Preterm Abortions TAB SAB Ect Mult Living   4 1 1  2  2  0  1      Review of Systems  All other systems reviewed and are negative.    Allergies  Labetalol; Procardia; and Zithromax  Home Medications   Current Outpatient Rx  Name Route Sig Dispense Refill  . ALPRAZOLAM 0.25 MG PO TABS Oral Take 0.25 mg by mouth 3 (three) times daily as needed. Anxitey/panic attack    . CALCIUM CARBONATE-VITAMIN D 250-125 MG-UNIT PO TABS Oral Take 1 tablet by  mouth daily.    Marland Kitchen METOPROLOL TARTRATE 25 MG PO TABS Oral Take 25 mg by mouth 2 (two) times daily.    Marland Kitchen PANTOPRAZOLE SODIUM 40 MG PO TBEC Oral Take 40 mg by mouth daily.    Marland Kitchen PRENATAL MULTIVITAMIN CH Oral Take 1 tablet by mouth daily.      BP 155/90  Pulse 109  Temp(Src) 97.9 F (36.6 C) (Oral)  Resp 22  SpO2 99%  LMP 09/06/2011  Physical Exam  Nursing note and vitals reviewed. Constitutional: She is oriented to person, place, and time. She appears well-developed and well-nourished.  Non-toxic appearance. No distress.  HENT:  Head: Normocephalic and atraumatic.  Eyes: Conjunctivae, EOM and lids are normal. Pupils are equal, round, and reactive to light.  Neck: Normal range of motion. Neck supple. No tracheal deviation present. No mass present.  Cardiovascular: Regular rhythm and normal heart sounds.  Tachycardia present.  Exam reveals no gallop.   No murmur heard. Pulmonary/Chest: Effort normal and breath sounds normal. No stridor. No respiratory distress. She has no decreased breath sounds. She has no wheezes. She has no rhonchi. She has no rales.  Abdominal: Soft. Normal appearance and bowel sounds are normal. She exhibits no distension. There is no tenderness. There is no rebound and no CVA tenderness.  Musculoskeletal: Normal  range of motion. She exhibits no edema and no tenderness.  Neurological: She is alert and oriented to person, place, and time. She has normal strength. No cranial nerve deficit or sensory deficit. GCS eye subscore is 4. GCS verbal subscore is 5. GCS motor subscore is 6.  Skin: Skin is warm and dry. No abrasion and no rash noted.  Psychiatric: Her speech is normal and behavior is normal. Her mood appears anxious.    ED Course  Procedures (including critical care time)  Labs Reviewed - No data to display No results found.   No diagnosis found.    MDM   Date: 12/13/2011  Rate: 94  Rhythm: normal sinus rhythm  QRS Axis: normal  Intervals: normal   ST/T Wave abnormalities: nonspecific T wave changes  Conduction Disutrbances:none  Narrative Interpretation:   Old EKG Reviewed: unchanged  ,6:06 PM Patient given medications for reflux and feels better. Carpal pedal spasm has resolved. She is stable for discharge        Toy Baker, MD 12/13/11 1807

## 2012-01-31 ENCOUNTER — Emergency Department (HOSPITAL_COMMUNITY)
Admission: EM | Admit: 2012-01-31 | Discharge: 2012-01-31 | Disposition: A | Discharge status: Home Health | Payer: Federal, State, Local not specified - PPO | Attending: Emergency Medicine | Admitting: Emergency Medicine

## 2012-01-31 DIAGNOSIS — I10 Essential (primary) hypertension: Secondary | ICD-10-CM | POA: Insufficient documentation

## 2012-01-31 DIAGNOSIS — K3189 Other diseases of stomach and duodenum: Secondary | ICD-10-CM | POA: Insufficient documentation

## 2012-01-31 DIAGNOSIS — R51 Headache: Secondary | ICD-10-CM

## 2012-01-31 LAB — URINALYSIS, ROUTINE W REFLEX MICROSCOPIC
Bilirubin Urine: NEGATIVE
Glucose, UA: NEGATIVE mg/dL
Ketones, ur: NEGATIVE mg/dL
Leukocytes, UA: NEGATIVE
Nitrite: NEGATIVE
Protein, ur: NEGATIVE mg/dL
Specific Gravity, Urine: 1.012 (ref 1.005–1.030)
Urobilinogen, UA: 0.2 mg/dL (ref 0.0–1.0)
pH: 8 (ref 5.0–8.0)

## 2012-01-31 LAB — POCT I-STAT, CHEM 8
Calcium, Ion: 1.34 mmol/L — ABNORMAL HIGH (ref 1.12–1.32)
HCT: 42 % (ref 36.0–46.0)
TCO2: 25 mmol/L (ref 0–100)

## 2012-01-31 LAB — POCT PREGNANCY, URINE: Preg Test, Ur: NEGATIVE

## 2012-01-31 LAB — URINE MICROSCOPIC-ADD ON

## 2012-01-31 MED ORDER — SODIUM CHLORIDE 0.9 % IV BOLUS (SEPSIS)
1000.0000 mL | Freq: Once | INTRAVENOUS | Status: AC
  Administered 2012-01-31: 1000 mL via INTRAVENOUS

## 2012-01-31 MED ORDER — HYDROCODONE-ACETAMINOPHEN 5-325 MG PO TABS
1.0000 | ORAL_TABLET | Freq: Once | ORAL | Status: AC
  Administered 2012-01-31: 1 via ORAL
  Filled 2012-01-31: qty 1

## 2012-01-31 MED ORDER — KETOROLAC TROMETHAMINE 30 MG/ML IJ SOLN
30.0000 mg | Freq: Once | INTRAMUSCULAR | Status: AC
  Administered 2012-01-31: 30 mg via INTRAVENOUS
  Filled 2012-01-31: qty 1

## 2012-01-31 NOTE — Discharge Instructions (Signed)
Followup with your primary care Dr. for recheck. your blood pressure has stabilized here in the emergency room.  Testing here tonight was normal.  Return here for any worsening in her condition

## 2012-01-31 NOTE — ED Provider Notes (Signed)
History     CSN: [id number]  Arrival date & time 01/31/12  4098   First MD Initiated Contact with Patient 01/31/12 0214      Chief Complaint  Patient presents with  . Hypertension    (Consider location/radiation/quality/duration/timing/severity/associated sxs/prior treatment) HPI Patient presents emergency Dept., stating, that she has been under a lot of stress lately stating that her blood pressures been going up and down.  She states, that she has had some headache over the last several days and she took 2 of her Toprol this evening.  Patient states that his had some indigestion as well.  Patient denies fever, visual changes difficulty urinating, nausea, vomiting, diarrhea, abdominal pain, chest pain, shortness of breath, dizziness, weakness, or fever.  Patient, states she has not tried any other medications to alleviate her headache.  She states nothing seemed to make her pain better or worse Past Medical History  Diagnosis Date  . Panic attacks   . Hypertension   . Fibroids     Past Surgical History  Procedure Date  . Parathyroidectomy   . Cesarean section   . Parathyroidectomy   . Hysteroscopy     Family History  Problem Relation Age of Onset  . Anesthesia problems Neg Hx   . Hypotension Neg Hx   . Malignant hyperthermia Neg Hx   . Pseudochol deficiency Neg Hx     History  Substance Use Topics  . Smoking status: Never Smoker   . Smokeless tobacco: Not on file  . Alcohol Use: No    OB History    Grav Para Term Preterm Abortions TAB SAB Ect Mult Living   4 1 1  2  2  0  1      Review of Systems All other systems negative except as documented in the HPI. All pertinent positives and negatives as reviewed in the HPI.  Allergies  Labetalol; Procardia; and Zithromax  Home Medications   Current Outpatient Rx  Name Route Sig Dispense Refill  . ALPRAZOLAM 0.25 MG PO TABS Oral Take 0.25 mg by mouth 3 (three) times daily as needed. Anxitey/panic attack    .  BUPROPION HCL 100 MG PO TABS Oral Take 100 mg by mouth daily.    Marland Kitchen CALCIUM CARBONATE-VITAMIN D 250-125 MG-UNIT PO TABS Oral Take 1 tablet by mouth daily.    Marland Kitchen METOPROLOL TARTRATE 25 MG PO TABS Oral Take 25 mg by mouth 2 (two) times daily.    Marland Kitchen PANTOPRAZOLE SODIUM 40 MG PO TBEC Oral Take 40 mg by mouth daily.    Marland Kitchen PRENATAL MULTIVITAMIN CH Oral Take 1 tablet by mouth daily.      BP 123/87  Pulse 58  Temp(Src) 98.6 F (37 C) (Oral)  Resp 16  Ht 5\' 1"  (1.549 m)  SpO2 99%  LMP 01/28/2012  Physical Exam Physical Examination: General appearance - alert, well appearing, and in no distress, oriented to person, place, and time and overweight Mental status - alert, oriented to person, place, and time, normal mood, behavior, speech, dress, motor activity, and thought processes Eyes - pupils equal and reactive, extraocular eye movements intact Ears - bilateral TM's and external ear canals normal Nose - normal and patent, no erythema, discharge or polyps Mouth - mucous membranes moist, pharynx normal without lesions Neck - supple, no significant adenopathy Chest - clear to auscultation, no wheezes, rales or rhonchi, symmetric air entry Heart - normal rate, regular rhythm, normal S1, S2, no murmurs, rubs, clicks or gallops Neurological - alert,  oriented, normal speech, no focal findings or movement disorder noted, motor and sensory grossly normal bilaterally, normal muscle tone, no tremors, strength 5/5 Skin - normal coloration and turgor, no rashes, no suspicious skin lesions noted  ED Course  Procedures (including critical care time)  Labs Reviewed  POCT I-STAT, CHEM 8 - Abnormal; Notable for the following:    Glucose, Bld 104 (*)    Calcium, Ion 1.34 (*)    All other components within normal limits  POCT PREGNANCY, URINE  URINALYSIS, ROUTINE W REFLEX MICROSCOPIC   Patient vital signs are stabilized during her visit here in the emergency department.  She is not showing any signs of  hypertensive emergency.  Patient is advised to return here as needed.  Advised to follow up with her primary care Dr. for recheck and further evaluation of her blood pressure medication.   MDM  MDM Reviewed: nursing note, vitals and previous chart Interpretation: labs            Carlyle Dolly, PA-C 01/31/12 0455  Carlyle Dolly, PA-C 01/31/12 [phone]

## 2012-01-31 NOTE — ED Notes (Signed)
Pt states that she has felt like her BP has been high lately. She has had a headache, head tightness, and just hasn't felt well. She usually takes metoprolol 25mg  BID but took 50mg  tonight. States at home it was in the 160s /100s at one point. Also states she's having midepigastric tightness.

## 2012-02-01 NOTE — ED Provider Notes (Signed)
Medical screening examination/treatment/procedure(s) were performed by non-physician practitioner and as supervising physician I was immediately available for consultation/collaboration.   Sunnie Nielsen, MD 02/01/12 [phone]

## 2012-09-05 ENCOUNTER — Other Ambulatory Visit: Payer: Self-pay | Admitting: Obstetrics and Gynecology

## 2013-02-10 ENCOUNTER — Emergency Department (HOSPITAL_COMMUNITY)
Admission: EM | Admit: 2013-02-10 | Discharge: 2013-02-10 | Disposition: A | Discharge status: Home / Self Care | Payer: Federal, State, Local not specified - PPO | Attending: Emergency Medicine | Admitting: Emergency Medicine

## 2013-02-10 ENCOUNTER — Emergency Department (HOSPITAL_COMMUNITY): Payer: Federal, State, Local not specified - PPO

## 2013-02-10 ENCOUNTER — Encounter (HOSPITAL_COMMUNITY): Payer: Self-pay | Admitting: Emergency Medicine

## 2013-02-10 DIAGNOSIS — I10 Essential (primary) hypertension: Secondary | ICD-10-CM | POA: Insufficient documentation

## 2013-02-10 DIAGNOSIS — E119 Type 2 diabetes mellitus without complications: Secondary | ICD-10-CM | POA: Insufficient documentation

## 2013-02-10 DIAGNOSIS — N200 Calculus of kidney: Secondary | ICD-10-CM | POA: Insufficient documentation

## 2013-02-10 DIAGNOSIS — F410 Panic disorder [episodic paroxysmal anxiety] without agoraphobia: Secondary | ICD-10-CM | POA: Insufficient documentation

## 2013-02-10 DIAGNOSIS — R350 Frequency of micturition: Secondary | ICD-10-CM | POA: Insufficient documentation

## 2013-02-10 DIAGNOSIS — Z3202 Encounter for pregnancy test, result negative: Secondary | ICD-10-CM | POA: Insufficient documentation

## 2013-02-10 DIAGNOSIS — N898 Other specified noninflammatory disorders of vagina: Secondary | ICD-10-CM | POA: Insufficient documentation

## 2013-02-10 DIAGNOSIS — R109 Unspecified abdominal pain: Secondary | ICD-10-CM | POA: Insufficient documentation

## 2013-02-10 DIAGNOSIS — R6883 Chills (without fever): Secondary | ICD-10-CM | POA: Insufficient documentation

## 2013-02-10 DIAGNOSIS — M549 Dorsalgia, unspecified: Secondary | ICD-10-CM | POA: Insufficient documentation

## 2013-02-10 DIAGNOSIS — Z79899 Other long term (current) drug therapy: Secondary | ICD-10-CM | POA: Insufficient documentation

## 2013-02-10 DIAGNOSIS — Z9889 Other specified postprocedural states: Secondary | ICD-10-CM | POA: Insufficient documentation

## 2013-02-10 DIAGNOSIS — Z8742 Personal history of other diseases of the female genital tract: Secondary | ICD-10-CM | POA: Insufficient documentation

## 2013-02-10 LAB — URINALYSIS, ROUTINE W REFLEX MICROSCOPIC
Glucose, UA: NEGATIVE mg/dL
Ketones, ur: NEGATIVE mg/dL
Leukocytes, UA: NEGATIVE
Nitrite: NEGATIVE
Protein, ur: NEGATIVE mg/dL
Urobilinogen, UA: 0.2 mg/dL (ref 0.0–1.0)

## 2013-02-10 LAB — CBC WITH DIFFERENTIAL/PLATELET
Basophils Absolute: 0 10*3/uL (ref 0.0–0.1)
Basophils Relative: 0 % (ref 0–1)
HCT: 38.1 % (ref 36.0–46.0)
Lymphocytes Relative: 18 % (ref 12–46)
MCHC: 34.4 g/dL (ref 30.0–36.0)
Monocytes Absolute: 0.5 10*3/uL (ref 0.1–1.0)
Neutro Abs: 6.2 10*3/uL (ref 1.7–7.7)
Neutrophils Relative %: 75 % (ref 43–77)
Platelets: 296 10*3/uL (ref 150–400)
RDW: 14 % (ref 11.5–15.5)
WBC: 8.3 10*3/uL (ref 4.0–10.5)

## 2013-02-10 LAB — COMPREHENSIVE METABOLIC PANEL
ALT: 24 U/L (ref 0–35)
AST: 17 U/L (ref 0–37)
Albumin: 3.5 g/dL (ref 3.5–5.2)
CO2: 26 mEq/L (ref 19–32)
Chloride: 104 mEq/L (ref 96–112)
Creatinine, Ser: 0.57 mg/dL (ref 0.50–1.10)
GFR calc non Af Amer: >90 mL/min (ref >90)
Potassium: 3.4 mEq/L — ABNORMAL LOW (ref 3.5–5.1)
Sodium: 139 mEq/L (ref 135–145)
Total Bilirubin: 0.2 mg/dL — ABNORMAL LOW (ref 0.3–1.2)

## 2013-02-10 LAB — WET PREP, GENITAL
Clue Cells Wet Prep HPF POC: NONE SEEN
Trich, Wet Prep: NONE SEEN

## 2013-02-10 LAB — URINE MICROSCOPIC-ADD ON

## 2013-02-10 MED ORDER — ACETAMINOPHEN 325 MG PO TABS
650.0000 mg | ORAL_TABLET | Freq: Once | ORAL | Status: AC
  Administered 2013-02-10: 650 mg via ORAL
  Filled 2013-02-10: qty 2

## 2013-02-10 MED ORDER — KETOROLAC TROMETHAMINE 10 MG PO TABS: 10.0000 mg | ORAL_TABLET | Freq: Four times a day (QID) | ORAL | Status: DC | PRN

## 2013-02-10 MED ORDER — KETOROLAC TROMETHAMINE 30 MG/ML IJ SOLN
30.0000 mg | Freq: Once | INTRAMUSCULAR | Status: AC
  Administered 2013-02-10: 30 mg via INTRAMUSCULAR
  Filled 2013-02-10: qty 1

## 2013-02-10 MED ORDER — TAMSULOSIN HCL 0.4 MG PO CAPS: 0.4000 mg | ORAL_CAPSULE | Freq: Every day | ORAL | Status: DC

## 2013-02-10 NOTE — ED Provider Notes (Signed)
History     CSN: [id number]  Arrival date & time 02/10/13  4098   First MD Initiated Contact with Patient 02/10/13 [phone]      Chief Complaint  Patient presents with  . Dysuria    (Consider location/radiation/quality/duration/timing/severity/associated sxs/prior treatment) HPI  The patient is a 32 year old female G5 P1 past medical history significant for anxiety, DM, uterine fibroids, HTN presenting for left-sided nonradiating back pain that began yesterday. Patient states pain is pressure rates at a 5/10. Denies alleviating or aggravating factors. The patient has associated chills, suprapubic pressure, increased frequency, dark urine. Patient denies dysuria, hematuria, vaginal pain, vaginal discharge, vaginal bleeding. Patient states last menstrual period was 5 weeks ago. States she does not have regular cycles due to her PCOS.  Past Medical History  Diagnosis Date  . Panic attacks   . Hypertension   . Fibroids     Past Surgical History  Procedure Laterality Date  . Parathyroidectomy    . Cesarean section    . Parathyroidectomy    . Hysteroscopy      Family History  Problem Relation Age of Onset  . Anesthesia problems Neg Hx   . Hypotension Neg Hx   . Malignant hyperthermia Neg Hx   . Pseudochol deficiency Neg Hx     History  Substance Use Topics  . Smoking status: Never Smoker   . Smokeless tobacco: Not on file  . Alcohol Use: No    OB History   Grav Para Term Preterm Abortions TAB SAB Ect Mult Living   4 1 1  2  2  0  1      Review of Systems  Constitutional: Positive for chills. Negative for fever.  Respiratory: Negative for shortness of breath.   Cardiovascular: Negative for chest pain.  Genitourinary: Positive for frequency and flank pain. Negative for urgency, hematuria, decreased urine volume, vaginal bleeding, vaginal discharge and vaginal pain.  All other systems reviewed and are negative.    Allergies  Labetalol; Procardia; Wellbutrin; and  Zithromax  Home Medications   Current Outpatient Rx  Name  Route  Sig  Dispense  Refill  . ALPRAZolam (XANAX) 0.25 MG tablet   Oral   Take 0.25 mg by mouth 3 (three) times daily as needed. Anxitey/panic attack         . calcium-vitamin D (OSCAL WITH D) 250-125 MG-UNIT per tablet   Oral   Take 1 tablet by mouth every morning.          Marland Kitchen ibuprofen (ADVIL,MOTRIN) 200 MG tablet   Oral   Take 600 mg by mouth every 8 (eight) hours as needed for pain.         . metFORMIN (GLUCOPHAGE) 500 MG tablet   Oral   Take 500 mg by mouth daily.         . metoprolol tartrate (LOPRESSOR) 25 MG tablet   Oral   Take 25 mg by mouth 2 (two) times daily.         . pantoprazole (PROTONIX) 40 MG tablet   Oral   Take 40 mg by mouth every morning.          . Prenatal Vit-Fe Fumarate-FA (PRENATAL MULTIVITAMIN) TABS   Oral   Take 1 tablet by mouth every evening.          . verapamil (CALAN-SR) 120 MG CR tablet   Oral   Take 120 mg by mouth 2 (two) times daily.         Marland Kitchen ketorolac (  TORADOL) 10 MG tablet   Oral   Take 1 tablet (10 mg total) by mouth every 6 (six) hours as needed for pain.   20 tablet   0   . tamsulosin (FLOMAX) 0.4 MG CAPS   Oral   Take 1 capsule (0.4 mg total) by mouth daily.   7 capsule   0     BP 148/97  Pulse 72  Temp(Src) 98.4 F (36.9 C) (Oral)  Resp 20  SpO2 100%  LMP 01/05/2013  Physical Exam  Constitutional: She is oriented to person, place, and time. She appears well-developed and well-nourished.  HENT:  Head: Normocephalic and atraumatic.  Eyes: EOM are normal. Pupils are equal, round, and reactive to light.  Neck: Neck supple.  Cardiovascular: Normal rate, regular rhythm and normal heart sounds.   Pulmonary/Chest: Effort normal and breath sounds normal. No respiratory distress. She exhibits no tenderness.  Abdominal: Soft. Bowel sounds are normal. She exhibits no distension. There is no tenderness. There is no rebound and no guarding.   Genitourinary: Uterus normal. Cervix exhibits no motion tenderness, no discharge and no friability. Right adnexum displays no mass, no tenderness and no fullness. Left adnexum displays no mass, no tenderness and no fullness. Vaginal discharge found.  Neurological: She is alert and oriented to person, place, and time.  Skin: Skin is warm and dry. No rash noted.  Psychiatric: She has a normal mood and affect.     Exam performed by Francee Piccolo L,  exam chaperoned Date: 02/10/2013 Pelvic exam: normal external genitalia without evidence of trauma. VULVA: normal appearing vulva with no masses, tenderness or lesion. VAGINA: normal appearing vagina with normal color and discharge, no lesions. CERVIX: normal appearing cervix without lesions, cervical motion tenderness absent, cervical os closed with out purulent discharge; vaginal discharge - white, Wet prep and DNA probe for chlamydia and GC obtained.   ADNEXA: normal adnexa in size, nontender and no masses UTERUS: uterus is normal size, shape, consistency and nontender.   ED Course  Procedures (including critical care time)  Medications  acetaminophen (TYLENOL) tablet 650 mg (650 mg Oral Given 02/10/13 0930)  ketorolac (TORADOL) 30 MG/ML injection 30 mg (30 mg Intramuscular Given 02/10/13 1108)     Labs Reviewed  WET PREP, GENITAL - Abnormal; Notable for the following:    WBC, Wet Prep HPF POC FEW (*)    All other components within normal limits  URINALYSIS, ROUTINE W REFLEX MICROSCOPIC - Abnormal; Notable for the following:    Hgb urine dipstick LARGE (*)    All other components within normal limits  COMPREHENSIVE METABOLIC PANEL - Abnormal; Notable for the following:    Potassium 3.4 (*)    Glucose, Bld 119 (*)    Total Bilirubin 0.2 (*)    All other components within normal limits  GC/CHLAMYDIA PROBE AMP  CBC WITH DIFFERENTIAL  HCG, QUANTITATIVE, PREGNANCY  URINE MICROSCOPIC-ADD ON  POCT PREGNANCY, URINE   Ct Abdomen  Pelvis Wo Contrast  02/10/2013   *RADIOLOGY REPORT*  Clinical Data: Dysuria.  Low back pain.  CT ABDOMEN AND PELVIS WITHOUT CONTRAST  Technique:  Multidetector CT imaging of the abdomen and pelvis was performed following the standard protocol without intravenous contrast.  Comparison: 04/02/2008  Findings: Lung bases are clear.  No pleural or pericardial fluid. The liver has a normal appearance without focal lesions or biliary ductal dilatation.  No calcified gallstones.  The spleen is normal. The pancreas is normal.  The adrenal glands are normal.  The  right kidney is normal without cyst, mass, stone or hydronephrosis.  The left kidney contains a stone in the renal pelvis at the UPJ measuring 6 mm in diameter.  There is at most minimal fullness of the left renal collecting system, but this could be a cause of urothelial irritation or intermittent ball valve obstruction.  No stones seen along the course of either ureter distally.  No stone in the bladder.  The aorta and IVC are normal.  No retroperitoneal mass or adenopathy.  No free intraperitoneal fluid or air.  No bowel pathology.  Uterus and adnexal regions are unremarkable.  IMPRESSION: 6 mm stone in the left renal pelvis at the UPJ region.  At most, there is minimal fullness of the left renal collecting system. This stone could cause urothelial irritation and could cause  ball valve intermittent obstruction of the left kidney.   Original Report Authenticated By: Paulina Fusi, M.D.     1. Kidney stone on left side       MDM  Patient is a 32 year old female presenting to the emergency department for right low back pain. Patient was mildly tender to palpation in the right low back. No marked CVA tenderness, nonsurgical abdomen. Vital signs stable. CT scan revealed kidney stone. Urology was consult and followup was scheduled with them this week. Pain was managed in the ED. Patient at home with symptomatic care and pain management. Patient agreeable to  plan. Discussed case with Dr. Fredderick Phenix who agrees. Patient stable at time of discharge.        Jeannetta Ellis, PA-C 02/10/13 1449

## 2013-02-10 NOTE — ED Notes (Signed)
Pt c/o dysuria.  States that she is having pressure in her low back and bladder.  States that her urine is dark and has sediment.

## 2013-02-10 NOTE — ED Provider Notes (Signed)
Medical screening examination/treatment/procedure(s) were performed by non-physician practitioner and as supervising physician I was immediately available for consultation/collaboration.   Rolan Bucco, MD 02/10/13 [phone]

## 2013-02-10 NOTE — ED Notes (Signed)
PA  Victorino Dike is aware that pelvic is set up.

## 2013-02-10 NOTE — ED Notes (Signed)
Family at bedside. 

## 2013-02-11 LAB — GC/CHLAMYDIA PROBE AMP: CT Probe RNA: NEGATIVE

## 2013-02-12 ENCOUNTER — Other Ambulatory Visit: Payer: Self-pay | Admitting: Urology

## 2013-02-12 ENCOUNTER — Encounter (HOSPITAL_COMMUNITY): Payer: Self-pay | Admitting: *Deleted

## 2013-02-13 ENCOUNTER — Ambulatory Visit (HOSPITAL_COMMUNITY)
Admission: RE | Admit: 2013-02-13 | Discharge: 2013-02-13 | Disposition: A | Discharge status: Home / Self Care | Payer: Federal, State, Local not specified - PPO | Source: Ambulatory Visit | Attending: Urology | Admitting: Urology

## 2013-02-13 ENCOUNTER — Ambulatory Visit (HOSPITAL_COMMUNITY): Payer: Federal, State, Local not specified - PPO

## 2013-02-13 ENCOUNTER — Encounter (HOSPITAL_COMMUNITY): Payer: Self-pay | Admitting: *Deleted

## 2013-02-13 ENCOUNTER — Encounter (HOSPITAL_COMMUNITY)
Admission: RE | Disposition: A | Discharge status: Home / Self Care | Payer: Self-pay | Source: Ambulatory Visit | Attending: Urology

## 2013-02-13 DIAGNOSIS — Z888 Allergy status to other drugs, medicaments and biological substances status: Secondary | ICD-10-CM | POA: Insufficient documentation

## 2013-02-13 DIAGNOSIS — Z79899 Other long term (current) drug therapy: Secondary | ICD-10-CM | POA: Insufficient documentation

## 2013-02-13 DIAGNOSIS — F3289 Other specified depressive episodes: Secondary | ICD-10-CM | POA: Insufficient documentation

## 2013-02-13 DIAGNOSIS — F329 Major depressive disorder, single episode, unspecified: Secondary | ICD-10-CM | POA: Insufficient documentation

## 2013-02-13 DIAGNOSIS — F411 Generalized anxiety disorder: Secondary | ICD-10-CM | POA: Insufficient documentation

## 2013-02-13 DIAGNOSIS — E119 Type 2 diabetes mellitus without complications: Secondary | ICD-10-CM | POA: Insufficient documentation

## 2013-02-13 DIAGNOSIS — R12 Heartburn: Secondary | ICD-10-CM | POA: Insufficient documentation

## 2013-02-13 DIAGNOSIS — N201 Calculus of ureter: Secondary | ICD-10-CM | POA: Insufficient documentation

## 2013-02-13 DIAGNOSIS — I10 Essential (primary) hypertension: Secondary | ICD-10-CM | POA: Insufficient documentation

## 2013-02-13 HISTORY — DX: Gastro-esophageal reflux disease without esophagitis: K21.9

## 2013-02-13 HISTORY — DX: Polycystic ovarian syndrome: E28.2

## 2013-02-13 SURGERY — LITHOTRIPSY, ESWL
Anesthesia: LOCAL

## 2013-02-13 MED ORDER — CIPROFLOXACIN HCL 500 MG PO TABS
500.0000 mg | ORAL_TABLET | ORAL | Status: AC
  Administered 2013-02-13: 500 mg via ORAL
  Filled 2013-02-13: qty 1

## 2013-02-13 MED ORDER — DIAZEPAM 5 MG PO TABS
10.0000 mg | ORAL_TABLET | ORAL | Status: AC
  Administered 2013-02-13: 10 mg via ORAL
  Filled 2013-02-13: qty 2

## 2013-02-13 MED ORDER — DIPHENHYDRAMINE HCL 25 MG PO CAPS
25.0000 mg | ORAL_CAPSULE | ORAL | Status: AC
  Administered 2013-02-13: 25 mg via ORAL
  Filled 2013-02-13: qty 1

## 2013-02-13 MED ORDER — DEXTROSE-NACL 5-0.45 % IV SOLN
INTRAVENOUS | Status: DC
  Administered 2013-02-13: 14:00:00 via INTRAVENOUS

## 2013-02-13 NOTE — Op Note (Signed)
See [facility] OP note scanned into chart. 

## 2013-02-13 NOTE — H&P (Signed)
History of Present Illness   Debbie Walter presents today with a newly diagnosed 6 mm left proximal ureteral stone. 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 She has no prior history of nephrolithiasis but does have a history of a parathyroidectomy approximately 15 years ago. She has been on calcium supplementation along with vitamin D. She developed some gross hematuria and subsequent left flank pain. She was seen in the emergency room and diagnosed with a 6 mm stone in the left renal pelvis, right around the ureteropelvic junction. There was some mild fullness of the collecting system, but certainly no high-grade obstruction. She has continued to have at least mild to moderate discomfort. She is going to be starting a new job next week and would like the stone definitively managed if possible. Of note, the patient's urinalysis today does show some microhematuria. Urine pH is 6.0. I did check Hounsfield units on the stone earlier today when reviewing her CT and thought they were in the 600-700 range.      Past Medical History Problems  1. History of  Anxiety (Symptom) 300.00 2. History of  Diabetes Mellitus 250.00 3. History of  Hypertension 401.9 4. History of  Uterine Fibrosis 621.8  Surgical History Problems  1. History of  Cesarean Section 2. History of  Hysteroscopy 3. History of  Parathyroid Surgery  Current Meds 1. ALPRAZolam 0.25 MG Oral Tablet; Therapy: (Recorded:27May2014) to 2. Ibuprofen CAPS; Therapy: (Recorded:27May2014) to 3. MetFORMIN HCl 500 MG Oral Tablet; Therapy: (Recorded:27May2014) to 4. Metoprolol Tartrate 25 MG Oral Tablet; Therapy: (Recorded:27May2014) to 5. Oscal 500/200 D-3 TABS; Therapy: (Recorded:27May2014) to 6. Prenatal Plus 27-1 MG Oral Tablet; Therapy: (Recorded:27May2014) to 7. Protonix 40 MG Oral Tablet Delayed Release; Therapy: (Recorded:27May2014) to 8. Tamsulosin HCl 0.4 MG Oral Capsule; Therapy: (Recorded:27May2014) to 9. Toradol Oral 10 MG TABS;  Therapy: (Recorded:27May2014) to 10. Verapamil HCl ER 120 MG Oral Tablet Extended Release; Therapy: (Recorded:27May2014) to  Allergies Medication  1. Labetalol HCl TABS 2. Procardia XL TBCR 3. Wellbutrin TABS 4. Zithromax TABS  Social History Denied    History of  Alcohol Use   History of  Tobacco Use  Review of Systems Genitourinary, constitutional, skin, eye, otolaryngeal, hematologic/lymphatic, cardiovascular, pulmonary, endocrine, musculoskeletal, gastrointestinal, neurological and psychiatric system(s) were reviewed and pertinent findings if present are noted.  Genitourinary: urinary frequency and nocturia.  Gastrointestinal: flank pain and heartburn.  Psychiatric: depression and anxiety.    Vitals Vital Signs [Data Includes: Last 1 Day]  27May2014 03:30PM  BMI Calculated: 38.65 BSA Calculated: 1.95 Height: 5 ft 1.75 in Weight: 210 lb  Blood Pressure: 158 / 89 Temperature: 98.4 F Heart Rate: 92  Physical Exam Constitutional: Well nourished and well developed . No acute distress.  ENT:. The ears and nose are normal in appearance.  Pulmonary: No respiratory distress and normal respiratory rhythm and effort.  Cardiovascular: Heart rate and rhythm are normal . No peripheral edema.  Abdomen: The abdomen is soft and nontender. No masses are palpated. No CVA tenderness. No hernias are palpable. No hepatosplenomegaly noted.  Skin: Normal skin turgor, no visible rash and no visible skin lesions.  Neuro/Psych:. Mood and affect are appropriate.    Results/Data Urine [Data Includes: Last 1 Day]   27May2014  COLOR YELLOW   APPEARANCE CLOUDY   SPECIFIC GRAVITY 1.025   pH 6.0   GLUCOSE NEG mg/dL  BILIRUBIN NEG   KETONE NEG mg/dL  BLOOD LARGE   PROTEIN NEG mg/dL  UROBILINOGEN 0.2 mg/dL  NITRITE NEG  LEUKOCYTE ESTERASE NEG   SQUAMOUS EPITHELIAL/HPF FEW   WBC 0-2 WBC/hpf  RBC 21-50 RBC/hpf  BACTERIA NONE SEEN   CRYSTALS NONE SEEN   CASTS NONE SEEN     Assessment Assessed  1. Ureteral Stone 592.1 2. Gross Hematuria 599.71  Plan Gross Hematuria (599.71)  1. KUB  Done: 27May2014 12:00AM Health Maintenance (V70.0)  2. UA With REFLEX  Done: 27May2014 03:00PM  Discussion/Summary   Debbie Walter has been diagnosed with a 5-6 mm proximal left ureteral calculus. I obtained a KUB on her today. I see a faint calcification in the area of the ureteropelvic junction, which to me measures between 4 and 5 mm today on KUB imaging. The stone is visible but not terribly dense. With would correspond with her Hounsfield units of 6-700 units that I appreciated on CT imaging. I told Debbie Walter that a stone this size has approximately a 50/50 chance of passing spontaneously. She certainly requires no urgent intervention at this point and is doing okay clinically. She is to start a new job next Monday. Based on my schedule, it does not appear that I would be able to offer her lithotripsy until sometime next week, either Monday or Thursday. She would prefer, if at all possible, to have elective ESWL this week on Thursday. We did talk about the procedure, the success rates. She understands that if the stone migrates over the bony pelvis, it may be very difficult to visualize the stone and therefore treat her effectively. She is interested in trying to be scheduled and we will see if any provider could potentially work her on for an ESWL.     Signatures Electronically signed by : Barron Alvine, M.D.; Feb 12 2013 11:51AM

## 2013-10-09 ENCOUNTER — Encounter (HOSPITAL_COMMUNITY): Payer: Self-pay | Admitting: *Deleted

## 2013-10-09 ENCOUNTER — Inpatient Hospital Stay (HOSPITAL_COMMUNITY)
Admission: AD | Admit: 2013-10-09 | Discharge: 2013-10-09 | Disposition: A | Discharge status: Home / Self Care | Payer: Federal, State, Local not specified - PPO | Source: Ambulatory Visit | Attending: Obstetrics and Gynecology | Admitting: Obstetrics and Gynecology

## 2013-10-09 DIAGNOSIS — O10919 Unspecified pre-existing hypertension complicating pregnancy, unspecified trimester: Secondary | ICD-10-CM

## 2013-10-09 DIAGNOSIS — O26891 Other specified pregnancy related conditions, first trimester: Secondary | ICD-10-CM

## 2013-10-09 DIAGNOSIS — K219 Gastro-esophageal reflux disease without esophagitis: Secondary | ICD-10-CM | POA: Insufficient documentation

## 2013-10-09 DIAGNOSIS — R519 Headache, unspecified: Secondary | ICD-10-CM

## 2013-10-09 DIAGNOSIS — O10019 Pre-existing essential hypertension complicating pregnancy, unspecified trimester: Secondary | ICD-10-CM | POA: Insufficient documentation

## 2013-10-09 DIAGNOSIS — R42 Dizziness and giddiness: Secondary | ICD-10-CM | POA: Insufficient documentation

## 2013-10-09 DIAGNOSIS — R51 Headache: Secondary | ICD-10-CM

## 2013-10-09 LAB — URINALYSIS, ROUTINE W REFLEX MICROSCOPIC
BILIRUBIN URINE: NEGATIVE
Glucose, UA: NEGATIVE mg/dL
HGB URINE DIPSTICK: NEGATIVE
KETONES UR: NEGATIVE mg/dL
Leukocytes, UA: NEGATIVE
Nitrite: NEGATIVE
PH: 7.5 (ref 5.0–8.0)
Protein, ur: NEGATIVE mg/dL
SPECIFIC GRAVITY, URINE: 1.02 (ref 1.005–1.030)
Urobilinogen, UA: 0.2 mg/dL (ref 0.0–1.0)

## 2013-10-09 LAB — COMPREHENSIVE METABOLIC PANEL
ALT: 24 U/L (ref 0–35)
AST: 16 U/L (ref 0–37)
Albumin: 3.8 g/dL (ref 3.5–5.2)
Alkaline Phosphatase: 57 U/L (ref 39–117)
BUN: 5 mg/dL — AB (ref 6–23)
CHLORIDE: 102 meq/L (ref 96–112)
CO2: 24 meq/L (ref 19–32)
CREATININE: 0.67 mg/dL (ref 0.50–1.10)
Calcium: 10.3 mg/dL (ref 8.4–10.5)
GLUCOSE: 86 mg/dL (ref 70–99)
Potassium: 3.4 mEq/L — ABNORMAL LOW (ref 3.7–5.3)
Sodium: 139 mEq/L (ref 137–147)
Total Protein: 7.1 g/dL (ref 6.0–8.3)

## 2013-10-09 LAB — CBC
HEMATOCRIT: 35.7 % — AB (ref 36.0–46.0)
Hemoglobin: 12.1 g/dL (ref 12.0–15.0)
MCH: 27 pg (ref 26.0–34.0)
MCHC: 33.9 g/dL (ref 30.0–36.0)
MCV: 79.7 fL (ref 78.0–100.0)
Platelets: 300 10*3/uL (ref 150–400)
RBC: 4.48 MIL/uL (ref 3.87–5.11)
RDW: 15 % (ref 11.5–15.5)
WBC: 13.3 10*3/uL — AB (ref 4.0–10.5)

## 2013-10-09 LAB — POCT PREGNANCY, URINE: Preg Test, Ur: POSITIVE — AB

## 2013-10-09 NOTE — MAU Note (Signed)
Pt states she started feeling dizzy Monday, after Blood Pressure  medication was changed on Friday.

## 2013-10-09 NOTE — MAU Provider Note (Signed)
Chief Complaint: Hypertension   First Provider Initiated Contact with Patient 10/09/13 1856     SUBJECTIVE HPI: Debbie Walter is a 33 y.o. G5P1021 at 6 weeks by LMP who presents to maternity admissions reporting dizziness, especially when standing with onset 3 days ago.  She has chronic HTN and is on BP meds.  She called her primary care doctor last week and he changed her medication to Aldomet 250 mg BID because of her pregnancy.  She took Aldomet in a previous pregnancy without complication.  She has not had much of an appetite and has not eaten much and had little fluid to drink today.  She reports nausea but no vomiting.  She denies abdominal pain or vaginal bleeding.   Pt adjusted blood pressure cuff and switched arms while in MAU, obtaining a lower blood pressure on her right arm, than her left.  She expressed concern and fear of passing out at home.  Orthostatic blood pressure taken prior to D/C.   Past Medical History  Diagnosis Date  . Panic attacks   . Hypertension   . Fibroids   . PCOS (polycystic ovarian syndrome)     takes metformin for this  . Kidney stones 5/14  . GERD (gastroesophageal reflux disease)     takes protonix   Past Surgical History  Procedure Laterality Date  . Parathyroidectomy    . Cesarean section    . Parathyroidectomy    . Hysteroscopy     History   Social History  . Marital Status: Married    Spouse Name: N/A    Number of Children: N/A  . Years of Education: N/A   Occupational History  . Not on file.   Social History Main Topics  . Smoking status: Never Smoker   . Smokeless tobacco: Not on file  . Alcohol Use: No  . Drug Use: No  . Sexual Activity: Not on file   Other Topics Concern  . Not on file   Social History Narrative  . No narrative on file   No current facility-administered medications on file prior to encounter.   Current Outpatient Prescriptions on File Prior to Encounter  Medication Sig Dispense Refill  . ALPRAZolam  (XANAX) 0.25 MG tablet Take 0.25 mg by mouth 3 (three) times daily as needed for anxiety or sleep.       . calcium-vitamin D (OSCAL WITH D) 250-125 MG-UNIT per tablet Take 1 tablet by mouth every morning.       . metFORMIN (GLUCOPHAGE) 500 MG tablet Take 500 mg by mouth daily.      . Prenatal Vit-Fe Fumarate-FA (PRENATAL MULTIVITAMIN) TABS Take 1 tablet by mouth every evening.        Allergies  Allergen Reactions  . Labetalol Other (See Comments)    "makes me feel like things are crawling through my head"  . Procardia [Nifedipine] Other (See Comments)    headache  . Wellbutrin [Bupropion] Other (See Comments)    "Makes my blood pressure go up - I don't ever want to take it again"  . Zithromax [Azithromycin] Other (See Comments)    Gi upset    ROS: Pertinent items in HPI  OBJECTIVE Blood pressure 151/76, pulse 83, temperature 99 F (37.2 C), temperature source Oral, resp. rate 16. Patient Vitals for the past 24 hrs:  BP Temp Temp src Pulse Resp  10/09/13 2102 151/76 mmHg - - 83 -  10/09/13 2101 150/75 mmHg - - 82 -  10/09/13 2032 146/85 mmHg - -  81 -  10/09/13 2017 128/75 mmHg - - 77 -  10/09/13 2006 126/59 mmHg - - 79 -  10/09/13 1947 156/79 mmHg - - 77 -  10/09/13 1932 151/80 mmHg - - 76 -  10/09/13 1917 142/72 mmHg - - 77 -  10/09/13 1902 141/89 mmHg - - 87 -  10/09/13 1847 151/75 mmHg - - 92 -  10/09/13 1832 157/100 mmHg - - 89 -  10/09/13 1817 156/79 mmHg - - 83 -  10/09/13 1815 155/80 mmHg - - 89 -  10/09/13 1807 157/80 mmHg 99 F (37.2 C) Oral 94 16  10/09/13 1805 157/80 mmHg - - 94 -   GENERAL: Well-developed, well-nourished female in no acute distress.  HEENT: Normocephalic HEART: normal rate RESP: normal effort ABDOMEN: Soft, non-tender EXTREMITIES: Nontender, no edema NEURO: Alert and oriented   LAB RESULTS Results for orders placed during the hospital encounter of 10/09/13 (from the past 24 hour(s))  URINALYSIS, ROUTINE W REFLEX MICROSCOPIC     Status:  Abnormal   Collection Time    10/09/13  5:55 PM      Result Value Range   Color, Urine YELLOW  YELLOW   APPearance HAZY (*) CLEAR   Specific Gravity, Urine 1.020  1.005 - 1.030   pH 7.5  5.0 - 8.0   Glucose, UA NEGATIVE  NEGATIVE mg/dL   Hgb urine dipstick NEGATIVE  NEGATIVE   Bilirubin Urine NEGATIVE  NEGATIVE   Ketones, ur NEGATIVE  NEGATIVE mg/dL   Protein, ur NEGATIVE  NEGATIVE mg/dL   Urobilinogen, UA 0.2  0.0 - 1.0 mg/dL   Nitrite NEGATIVE  NEGATIVE   Leukocytes, UA NEGATIVE  NEGATIVE  POCT PREGNANCY, URINE     Status: Abnormal   Collection Time    10/09/13  6:30 PM      Result Value Range   Preg Test, Ur POSITIVE (*) NEGATIVE  CBC     Status: Abnormal   Collection Time    10/09/13  7:19 PM      Result Value Range   WBC 13.3 (*) 4.0 - 10.5 K/uL   RBC 4.48  3.87 - 5.11 MIL/uL   Hemoglobin 12.1  12.0 - 15.0 g/dL   HCT 35.7 (*) 36.0 - 46.0 %   MCV 79.7  78.0 - 100.0 fL   MCH 27.0  26.0 - 34.0 pg   MCHC 33.9  30.0 - 36.0 g/dL   RDW 15.0  11.5 - 15.5 %   Platelets 300  150 - 400 K/uL  COMPREHENSIVE METABOLIC PANEL     Status: Abnormal   Collection Time    10/09/13  7:19 PM      Result Value Range   Sodium 139  137 - 147 mEq/L   Potassium 3.4 (*) 3.7 - 5.3 mEq/L   Chloride 102  96 - 112 mEq/L   CO2 24  19 - 32 mEq/L   Glucose, Bld 86  70 - 99 mg/dL   BUN 5 (*) 6 - 23 mg/dL   Creatinine, Ser 0.67  0.50 - 1.10 mg/dL   Calcium 10.3  8.4 - 10.5 mg/dL   Total Protein 7.1  6.0 - 8.3 g/dL   Albumin 3.8  3.5 - 5.2 g/dL   AST 16  0 - 37 U/L   ALT 24  0 - 35 U/L   Alkaline Phosphatase 57  39 - 117 U/L   Total Bilirubin <0.2 (*) 0.3 - 1.2 mg/dL   GFR calc non Af Amer >90  >  90 mL/min   GFR calc Af Amer >90  >90 mL/min     ASSESSMENT 1. Dizziness   2. Pregnancy headache in first trimester   3. Chronic hypertension complicating or reason for care during pregnancy     PLAN Consult Dr Julien Girt Discharge home Continue Aldomet as prescribed Eat small frequent meals and  increase PO fluids Reassurance provided that dizziness is common in pregnancy, and usually resolves with food/fluids/position change.   Call office tomorrow if symptoms persist or call to make appointment on Monday if symptoms improve Return to MAU as needed    Medication List         ALPRAZolam 0.25 MG tablet  Commonly known as:  XANAX  Take 0.25 mg by mouth 3 (three) times daily as needed for anxiety or sleep.     calcium-vitamin D 250-125 MG-UNIT per tablet  Commonly known as:  OSCAL WITH D  Take 1 tablet by mouth every morning.     famotidine 10 MG tablet  Commonly known as:  PEPCID  Take 10 mg by mouth daily.     metFORMIN 500 MG tablet  Commonly known as:  GLUCOPHAGE  Take 500 mg by mouth daily.     methyldopa 250 MG tablet  Commonly known as:  ALDOMET  Take 250 mg by mouth 2 (two) times daily.     prenatal multivitamin Tabs tablet  Take 1 tablet by mouth every evening.       Follow-up Information   Follow up with ADKINS,GRETCHEN, MD. (Call tomorrow if symptoms persist.  If improved, call to make appointment on Monday.  Return to MAU as needed.)    Specialty:  Obstetrics and Gynecology   Contact information:   [address], [name] [address] [phone]       Fatima Blank Certified Nurse-Midwife 10/09/2013  9:11 PM

## 2013-10-09 NOTE — Discharge Instructions (Signed)
Dizziness Dizziness is a common problem. It is a feeling of unsteadiness or lightheadedness. You may feel like you are about to faint. Dizziness can lead to injury if you stumble or fall. A person of any age group can suffer from dizziness, but dizziness is more common in older adults. CAUSES  Dizziness can be caused by many different things, including:  Middle ear problems.  Standing for too long.  Infections.  An allergic reaction.  Aging.  An emotional response to something, such as the sight of blood.  Side effects of medicines.  Fatigue.  Problems with circulation or blood pressure.  Excess use of alcohol, medicines, or illegal drug use.  Breathing too fast (hyperventilation).  An arrhythmia or problems with your heart rhythm.  Low red blood cell count (anemia).  Pregnancy.  Vomiting, diarrhea, fever, or other illnesses that cause dehydration.  Diseases or conditions such as Parkinson's disease, high blood pressure (hypertension), diabetes, and thyroid problems.  Exposure to extreme heat. DIAGNOSIS  To find the cause of your dizziness, your caregiver may do a physical exam, lab tests, radiologic imaging scans, or an electrocardiography test (ECG).  TREATMENT  Treatment of dizziness depends on the cause of your symptoms and can vary greatly. HOME CARE INSTRUCTIONS   Drink enough fluids to keep your urine clear or pale yellow. This is especially important in very hot weather. In the elderly, it is also important in cold weather.  If your dizziness is caused by medicines, take them exactly as directed. When taking blood pressure medicines, it is especially important to get up slowly.  Rise slowly from chairs and steady yourself until you feel okay.  In the morning, first sit up on the side of the bed. When this seems okay, stand slowly while holding onto something until you know your balance is fine.  If you need to stand in one place for a long time, be sure to  move your legs often. Tighten and relax the muscles in your legs while standing.  If dizziness continues to be a problem, have someone stay with you for a day or two. Do this until you feel you are well enough to stay alone. Have the person call your caregiver if he or she notices changes in you that are concerning.  Do not drive or use heavy machinery if you feel dizzy.  Do not drink alcohol. SEEK IMMEDIATE MEDICAL CARE IF:   Your dizziness or lightheadedness gets worse.  You feel nauseous or vomit.  You develop problems with talking, walking, weakness, or using your arms, hands, or legs.  You are not thinking clearly or you have difficulty forming sentences. It may take a friend or family member to determine if your thinking is normal.  You develop chest pain, abdominal pain, shortness of breath, or sweating.  Your vision changes.  You notice any bleeding.  You have side effects from medicine that seems to be getting worse rather than better. MAKE SURE YOU:   Understand these instructions.  Will watch your condition.  Will get help right away if you are not doing well or get worse. Document Released: 02/28/2001 Document Revised: 11/27/2011 Document Reviewed: 03/24/2011 [facility] Patient Information 2014 [place], [place].  Hypertension During Pregnancy Hypertension is also called high blood pressure. It can occur at any time in life and during pregnancy. When you have hypertension, there is extra pressure inside your blood vessels that carry blood from the heart to the rest of your body (arteries). Hypertension during pregnancy  can cause problems for you and your baby. Your baby might not weigh as much as it should at birth or might be born early (premature). Very bad cases of hypertension during pregnancy can be life threatening.  Different types of hypertension can occur during pregnancy.  Chronic hypertension. This happens when a woman has hypertension before pregnancy and it  continues during pregnancy. Gestational hypertension. This is when hypertension develops during pregnancy. Preeclampsia or toxemia of pregnancy. This is a very serious type of hypertension that develops only during pregnancy. It is a disease that affects the whole body (systemic) and can be very dangerous for both mother and baby.  Gestational hypertension and preeclampsia usually go away after your baby is born. Blood pressure generally stabilizes within 6 weeks. Women who have hypertension during pregnancy have a greater chance of developing hypertension later in life or with future pregnancies. RISK FACTORS Some factors make you more likely to develop hypertension during pregnancy. Risk factors include: Having hypertension before pregnancy. Having hypertension during a previous pregnancy. Being overweight. Being older than 20. Being pregnant with more than one baby (multiples). Having diabetes or kidney problems. SIGNS AND SYMPTOMS Chronic and gestational hypertension rarely cause symptoms. Preeclampsia has symptoms, which may include: Increased protein in your urine. Your health care provider will check for this at every prenatal visit. Swelling of your hands and face. Rapid weight gain. Headaches. Visual changes. Being bothered by light. Abdominal pain, especially in the right upper area. Chest pain. Shortness of breath. Increased reflexes. Seizures. Seizures occur with a more severe form of preeclampsia, called eclampsia. DIAGNOSIS  You may be diagnosed with hypertension during a regular prenatal exam. At each visit, tests may include: Blood pressure checks. A urine test to check for protein in your urine. The type of hypertension you are diagnosed with depends on when you developed it. It also depends on your specific blood pressure reading. Developing hypertension before 20 weeks of pregnancy is consistent with chronic hypertension. Developing hypertension after 20 weeks of  pregnancy is consistent with gestational hypertension. Hypertension with increased urinary protein is diagnosed as preeclampsia. Blood pressure measurements that stay above 0000000 systolic or [id number] diastolic are a sign of severe preeclampsia. TREATMENT Treatment for hypertension during pregnancy varies. Treatment depends on the type of hypertension and how serious it is. If you take medicine for chronic hypertension, you may need to switch medicines. Drugs called ACE inhibitors should not be taken during pregnancy. Low-dose aspirin may be suggested for women who have risk factors for preeclampsia. If you have gestational hypertension, you may need to take a blood pressure medicine that is safe during pregnancy. Your health care provider will recommend the appropriate medicine. If you have severe preeclampsia, you may need to be in the hospital. Health care providers will watch you and the baby very closely. You also may need to take medicine (magnesium sulfate) to prevent seizures and lower blood pressure. Sometimes an early delivery is needed. This may be the case if the condition worsens. It would be done to protect you and the baby. The only cure for preeclampsia is delivery. HOME CARE INSTRUCTIONS Schedule and keep all of your regular appointments for prenatal care. Only take over-the-counter or prescription medicines as directed by your health care provider. Tell your health care provider about all medicines you take. Eat as little salt as possible. Get regular exercise. Do not drink alcohol. Do not use tobacco products. Do not drink products with caffeine. Lie on your left side when  resting. SEEK IMMEDIATE MEDICAL CARE IF: You have severe abdominal pain. You have sudden swelling in the hands, ankles, or face. You gain 4 pounds (1.8 kg) or more in 1 week. You vomit repeatedly. You have vaginal bleeding. You do not feel the baby moving as much. You have a headache. You have blurred or  double vision. You have muscle twitching or spasms. You have shortness of breath. You have blue fingernails and lips. You have blood in your urine. MAKE SURE YOU: Understand these instructions. Will watch your condition. Will get help right away if you are not doing well or get worse. Document Released: 05/23/2011 Document Revised: 06/25/2013 Document Reviewed: 04/03/2013 [facility] Patient Information 2014 [place], [place].

## 2013-10-11 ENCOUNTER — Inpatient Hospital Stay (HOSPITAL_COMMUNITY)
Admission: AD | Admit: 2013-10-11 | Discharge: 2013-10-11 | Disposition: A | Discharge status: Home / Self Care | Payer: Federal, State, Local not specified - PPO | Source: Ambulatory Visit | Attending: Obstetrics and Gynecology | Admitting: Obstetrics and Gynecology

## 2013-10-11 ENCOUNTER — Encounter (HOSPITAL_COMMUNITY): Payer: Self-pay | Admitting: Family

## 2013-10-11 DIAGNOSIS — O10019 Pre-existing essential hypertension complicating pregnancy, unspecified trimester: Secondary | ICD-10-CM | POA: Insufficient documentation

## 2013-10-11 DIAGNOSIS — I10 Essential (primary) hypertension: Secondary | ICD-10-CM

## 2013-10-11 DIAGNOSIS — O139 Gestational [pregnancy-induced] hypertension without significant proteinuria, unspecified trimester: Secondary | ICD-10-CM

## 2013-10-11 DIAGNOSIS — O99340 Other mental disorders complicating pregnancy, unspecified trimester: Secondary | ICD-10-CM | POA: Insufficient documentation

## 2013-10-11 DIAGNOSIS — F411 Generalized anxiety disorder: Secondary | ICD-10-CM | POA: Insufficient documentation

## 2013-10-11 LAB — URINALYSIS, ROUTINE W REFLEX MICROSCOPIC
Bilirubin Urine: NEGATIVE
Glucose, UA: NEGATIVE mg/dL
HGB URINE DIPSTICK: NEGATIVE
Ketones, ur: NEGATIVE mg/dL
Leukocytes, UA: NEGATIVE
NITRITE: NEGATIVE
PROTEIN: NEGATIVE mg/dL
Specific Gravity, Urine: 1.01 (ref 1.005–1.030)
UROBILINOGEN UA: 0.2 mg/dL (ref 0.0–1.0)
pH: 7.5 (ref 5.0–8.0)

## 2013-10-11 MED ORDER — ACETAMINOPHEN 500 MG PO TABS
1000.0000 mg | ORAL_TABLET | Freq: Once | ORAL | Status: AC
  Administered 2013-10-11: 1000 mg via ORAL
  Filled 2013-10-11: qty 2

## 2013-10-11 MED ORDER — METHYLDOPA 500 MG PO TABS: 500.0000 mg | ORAL_TABLET | Freq: Three times a day (TID) | ORAL | Status: DC

## 2013-10-11 NOTE — MAU Provider Note (Signed)
History     CSN: [id number]  Arrival date and time: 10/11/13 0830   First Provider Initiated Contact with Patient 10/11/13 [phone]      Chief Complaint  Patient presents with  . Hypertension   HPI  [name] is a 33 y.o. female G5P1021 at [redacted]w[redacted]d who presents with Hypertension; patient has a history of chronic hypertension. Allergic to Labetalol and procardia; currently taking aldomet. She was here 2 days ago with similar complaints; pts on aldomet and the dose was recently increased to TID dosing; normal labs.  Today the only symptom she has is "pressure in her head". No HA; rates the pain 5/10. She became concerned when she checked her BP first thing this AM and systolic was 295'J; patient quickly took her morning dose of aldomet and came to MAU. Patient has a lot going on at home and confesses to anxiety.   OB History   Grav Para Term Preterm Abortions TAB SAB Ect Mult Living   5 1 1  2  2  0  1      Past Medical History  Diagnosis Date  . Panic attacks   . Hypertension   . Fibroids   . PCOS (polycystic ovarian syndrome)     takes metformin for this  . Kidney stones 5/14  . GERD (gastroesophageal reflux disease)     takes protonix    Past Surgical History  Procedure Laterality Date  . Parathyroidectomy    . Cesarean section    . Parathyroidectomy    . Hysteroscopy      Family History  Problem Relation Age of Onset  . Anesthesia problems Neg Hx   . Hypotension Neg Hx   . Malignant hyperthermia Neg Hx   . Pseudochol deficiency Neg Hx     History  Substance Use Topics  . Smoking status: Never Smoker   . Smokeless tobacco: Not on file  . Alcohol Use: No    Allergies:  Allergies  Allergen Reactions  . Labetalol Other (See Comments)    "makes me feel like things are crawling through my head"  . Procardia [Nifedipine] Other (See Comments)    headache  . Wellbutrin [Bupropion] Other (See Comments)    "Makes my blood pressure go up - I don't ever want to  take it again"  . Zithromax [Azithromycin] Other (See Comments)    Gi upset    Prescriptions prior to admission  Medication Sig Dispense Refill  . CALCIUM PO Take 1 tablet by mouth daily.      . Cholecalciferol (VITAMIN D PO) Take 1 tablet by mouth daily.      . famotidine (PEPCID) 10 MG tablet Take 10 mg by mouth daily.      . methyldopa (ALDOMET) 250 MG tablet Take 250 mg by mouth 3 (three) times daily.       . Prenatal Vit-Fe Fumarate-FA (PRENATAL MULTIVITAMIN) TABS Take 1 tablet by mouth every evening.        Results for orders placed during the hospital encounter of 10/11/13 (from the past 48 hour(s))  URINALYSIS, ROUTINE W REFLEX MICROSCOPIC     Status: None   Collection Time    10/11/13  8:40 AM      Result Value Range   Color, Urine YELLOW  YELLOW   APPearance CLEAR  CLEAR   Specific Gravity, Urine 1.010  1.005 - 1.030   pH 7.5  5.0 - 8.0   Glucose, UA NEGATIVE  NEGATIVE mg/dL   Hgb  urine dipstick NEGATIVE  NEGATIVE   Bilirubin Urine NEGATIVE  NEGATIVE   Ketones, ur NEGATIVE  NEGATIVE mg/dL   Protein, ur NEGATIVE  NEGATIVE mg/dL   Urobilinogen, UA 0.2  0.0 - 1.0 mg/dL   Nitrite NEGATIVE  NEGATIVE   Leukocytes, UA NEGATIVE  NEGATIVE   Comment: MICROSCOPIC NOT DONE ON URINES WITH NEGATIVE PROTEIN, BLOOD, LEUKOCYTES, NITRITE, OR GLUCOSE <1000 mg/dL.    Review of Systems  Constitutional: Negative for fever and chills.  HENT: Negative for ear pain and hearing loss.   Eyes: Negative for blurred vision and double vision.  Respiratory: Negative for shortness of breath.   Cardiovascular: Negative for chest pain.  Genitourinary: Negative for dysuria, urgency, frequency and hematuria.       No vaginal discharge. No vaginal bleeding. No dysuria.   Neurological: Positive for dizziness. Negative for headaches.  Psychiatric/Behavioral: The patient is nervous/anxious.    Physical Exam   Blood pressure 145/76, pulse 91, temperature 98.2 F (36.8 C), temperature source Oral,  resp. rate 16, last menstrual period 01/05/2013.  Physical Exam  Constitutional: She appears well-developed and well-nourished.  Non-toxic appearance. She does not have a sickly appearance. No distress.  HENT:  Head: Normocephalic.  Eyes: Pupils are equal, round, and reactive to light.  Neck: Neck supple.  Respiratory: Effort normal.  Musculoskeletal: Normal range of motion.  Neurological: She is alert. She has normal strength and normal reflexes. No sensory deficit. GCS eye subscore is 4. GCS verbal subscore is 5. GCS motor subscore is 6.  Skin: Skin is warm. She is not diaphoretic.  Psychiatric: Her mood appears anxious.    MAU Course  Procedures None  MDM UA  Consulted with Dr. Corinna Capra; will plan to increase Aldomet to 500 TID. Discussed at length with the patient regarding the need to give the medication time to adjust in her system. At this time it would be best to refrain from checking her BP at home unless she is having symptoms; blurred vision, HA ect. Pt has anxiety and checking her BP at home is causing her anxiety to increase more.   Assessment and Plan   A:  1. Chronic hypertension   2.  Elevated BP readings; normal exam   P:  Discharge home in stable condition Warning signs discussed on when to seek care Increase aldomet to 500 mg TID RX: Aldomet 500 mg TABS Pt is scheduled to see Dr. Orvan Seen on Monday for a BP check; pt encouraged to keep that appointment Return to MAU as needed, if symptoms worsen Refrain from checking BP first thing in AM Limit salt intake  [name], NP  10/11/2013, 11:06 AM

## 2013-10-11 NOTE — MAU Note (Signed)
33 yo, G5P1 at [redacted]w[redacted]d, presents with c/o HTN.  Reports home reading before medication 197/93 left arm, HR 104. Methyldopa at 0700 today. Methyldopa 250mg  three times per day, began yesterday.Did not recheck blood pressure. Called RN line and was told to come in.Reports feeling intermittent head pressure throughout the night; sick daughter at home every hour throwing up.Denies feeling stressed or anxious under those circumstances.  Denies vision changes, heart palpitations, chest pain, claudication. HTN treated via meds since 33 yo. Strong family hx of HTN; denies premature MI/CVA familial risks.  Denies vaginal bleeding, cramping.

## 2013-10-11 NOTE — MAU Note (Signed)
BP medication hx:  Was on metoprolol 25mg  BID and verapamil 120mg  BID until 1/16, when she was switched to methyldopa 250 mg BID. Has been compliant with methyldopa 250mg  BID. Yesterday methyldopa 250 mg was increased to three times per day.

## 2013-10-30 LAB — OB RESULTS CONSOLE HIV ANTIBODY (ROUTINE TESTING): HIV: NONREACTIVE

## 2013-10-30 LAB — OB RESULTS CONSOLE RPR: RPR: NONREACTIVE

## 2013-10-30 LAB — OB RESULTS CONSOLE HEPATITIS B SURFACE ANTIGEN: HEP B S AG: NEGATIVE

## 2013-11-04 ENCOUNTER — Ambulatory Visit (HOSPITAL_COMMUNITY)
Admission: RE | Admit: 2013-11-04 | Discharge: 2013-11-04 | Disposition: A | Discharge status: Home / Self Care | Payer: Federal, State, Local not specified - PPO | Source: Ambulatory Visit | Attending: Obstetrics and Gynecology | Admitting: Obstetrics and Gynecology

## 2013-11-04 ENCOUNTER — Other Ambulatory Visit: Payer: Self-pay | Admitting: Obstetrics and Gynecology

## 2013-11-04 ENCOUNTER — Encounter (HOSPITAL_COMMUNITY): Payer: Self-pay

## 2013-11-04 VITALS — BP 142/99 | HR 98 | Wt 200.0 lb

## 2013-11-04 DIAGNOSIS — O10919 Unspecified pre-existing hypertension complicating pregnancy, unspecified trimester: Secondary | ICD-10-CM

## 2013-11-04 LAB — OB RESULTS CONSOLE GC/CHLAMYDIA
CHLAMYDIA, DNA PROBE: NEGATIVE
Gonorrhea: NEGATIVE

## 2013-11-04 NOTE — Progress Notes (Signed)
MATERNAL FETAL MEDICINE CONSULT  Patient Name: [place] Record Number:  [id number] Date of Birth: [birth year] Requesting Physician Name:  Allena Katz, MD Date of Service: 11/04/2013  Chief Complaint Chronic hypertension  History of Present Illness DOROTHA HIRSCHI was seen today secondary to chronic hypertension at the request of Shon Millet II, MD.  The patient is a 33 y.o. [id number],TM [redacted]w[redacted]d with an EDD of 06/02/2014, by Last Menstrual Period dating method.  She has a long standing history of hypertension for which she was taking metoprolol 25 mg po bid and verapamil ER 120 mg po bid prior to pregnancy.  She reports her BP was well controlled on this regimen.  Soon after her current pregnancy was diagnosed she was changed from this regimen to methyldopa which was steadily increased to her current dose of 500 mg po in the morning, 250 mg po midday, and 500 mg po in the evening.  This is not controlling her BP and she routinely has BP's of greater than 160/110 at which time she often experiences a moderate to severe headache.  She took her methyldopa soon before arriving in clinic today and is currently asymptomatic, but is worried her BP will soon rise as typically the effect of the methyldopa is short lived.  Review of Systems Pertinent items are noted in HPI.  Patient History OB History  Gravida Para Term Preterm AB SAB TAB Ectopic Multiple Living  6 1 1  3 3   0  1    # Outcome Date GA Lbr Len/2nd Weight Sex Delivery Anes PTL Lv  6 CUR           5 GRA              Comments: System Generated. Please review and update pregnancy details.  4 TRM           3 SAB           2 SAB           1 SAB               Past Medical History  Diagnosis Date  . Panic attacks   . Hypertension   . Fibroids   . PCOS (polycystic ovarian syndrome)     takes metformin for this  . Kidney stones 5/14  . GERD (gastroesophageal reflux disease)     takes protonix    Past Surgical  History  Procedure Laterality Date  . Parathyroidectomy    . Cesarean section    . Parathyroidectomy    . Hysteroscopy      History   Social History  . Marital Status: Married    Spouse Name: N/A    Number of Children: N/A  . Years of Education: N/A   Social History Main Topics  . Smoking status: Never Smoker   . Smokeless tobacco: Not on file  . Alcohol Use: No  . Drug Use: No  . Sexual Activity: Not on file   Other Topics Concern  . Not on file   Social History Narrative  . No narrative on file    Family History  Problem Relation Age of Onset  . Anesthesia problems Neg Hx   . Hypotension Neg Hx   . Malignant hyperthermia Neg Hx   . Pseudochol deficiency Neg Hx    In addition, the patient has no family history of mental retardation, birth defects, or genetic diseases.  Physical Examination Filed Vitals:  11/04/13 1412  BP: 142/99  Pulse: 98   General appearance - alert, well appearing, and in no distress  Assessment and Recommendations 1.  Chronic Hypertension.  Ms. Fuston has a long history of hypertension which is currently not well controlled on methyldopa.  As she achieved consistent BP control with the combination of metoprolol and verapamil prior to pregnancy I recommend restarting this regimen at previous doses.  Both are category C medications without known associations with birth defects.  Beta blockers, such as metoprolol, are associated with fetal growth restriction.  However, given the severity of Ms. Berthelot's hypertension the benefits of both medications clearly outweigh the risks.  Ms. Kirt understands this and agrees to restart her previous regimen.  I have provider her a prescription for metoprolol tartrate 25 mg po bid and verapamil ER 120 mg po bid.  She will start these medications this evening.  To prevent a rebound hypertension with cessation of methyldopa, Ms. Spanier will continue to take methyldopa 250 mg po bid for three days and then stop the  medication entirely.   As part of routine care for a woman with chronic hypertension, a CBC, AST, ALT, 24 hour urine collection, and serum creatinine should be performed to determine her baseline creatinine clearance and proteinuria.  This should be repeated each trimester and as clinically indicated based on disease symptoms or the appearance of hypertension.  As chronic hypertension is associated with fetal growth restriction the patient should have serial growth scans every 4-6 weeks after her anatomic survey at approximately 18 weeks.  Once or twice weekly fetal surveillance should be started at 32 weeks of gestation.  Her 18 week anatomy scan was scheduled at the [facility] in 2 months per patient request.   I spent 40 minutes with Ms. Weldon today of which 50% was face-to-face counseling.  Thank you for referring Ms. Naeve to the [facility].  Please do not hesitate to contact us with questions.   Jolyn Lent, MD

## 2013-11-05 ENCOUNTER — Other Ambulatory Visit: Payer: Self-pay

## 2013-11-11 ENCOUNTER — Inpatient Hospital Stay (HOSPITAL_COMMUNITY)
Admission: AD | Admit: 2013-11-11 | Discharge: 2013-11-11 | Disposition: A | Discharge status: Home / Self Care | Payer: Federal, State, Local not specified - PPO | Source: Ambulatory Visit | Attending: Obstetrics and Gynecology | Admitting: Obstetrics and Gynecology

## 2013-11-11 ENCOUNTER — Encounter (HOSPITAL_COMMUNITY): Payer: Self-pay | Admitting: *Deleted

## 2013-11-11 DIAGNOSIS — K219 Gastro-esophageal reflux disease without esophagitis: Secondary | ICD-10-CM

## 2013-11-11 DIAGNOSIS — O210 Mild hyperemesis gravidarum: Secondary | ICD-10-CM | POA: Insufficient documentation

## 2013-11-11 DIAGNOSIS — O26811 Pregnancy related exhaustion and fatigue, first trimester: Secondary | ICD-10-CM

## 2013-11-11 DIAGNOSIS — E282 Polycystic ovarian syndrome: Secondary | ICD-10-CM | POA: Insufficient documentation

## 2013-11-11 DIAGNOSIS — R1013 Epigastric pain: Secondary | ICD-10-CM | POA: Insufficient documentation

## 2013-11-11 DIAGNOSIS — O9989 Other specified diseases and conditions complicating pregnancy, childbirth and the puerperium: Secondary | ICD-10-CM

## 2013-11-11 DIAGNOSIS — IMO0002 Reserved for concepts with insufficient information to code with codable children: Secondary | ICD-10-CM | POA: Insufficient documentation

## 2013-11-11 LAB — URINALYSIS, ROUTINE W REFLEX MICROSCOPIC
Bilirubin Urine: NEGATIVE
GLUCOSE, UA: NEGATIVE mg/dL
KETONES UR: 15 mg/dL — AB
LEUKOCYTES UA: NEGATIVE
Nitrite: NEGATIVE
PROTEIN: NEGATIVE mg/dL
Specific Gravity, Urine: 1.01 (ref 1.005–1.030)
Urobilinogen, UA: 0.2 mg/dL (ref 0.0–1.0)
pH: 6.5 (ref 5.0–8.0)

## 2013-11-11 LAB — CBC
HCT: 36.4 % (ref 36.0–46.0)
Hemoglobin: 12.7 g/dL (ref 12.0–15.0)
MCH: 26.9 pg (ref 26.0–34.0)
MCHC: 34.9 g/dL (ref 30.0–36.0)
MCV: 77.1 fL — AB (ref 78.0–100.0)
PLATELETS: 293 10*3/uL (ref 150–400)
RBC: 4.72 MIL/uL (ref 3.87–5.11)
RDW: 13.9 % (ref 11.5–15.5)
WBC: 9.6 10*3/uL (ref 4.0–10.5)

## 2013-11-11 LAB — COMPREHENSIVE METABOLIC PANEL
ALT: 24 U/L (ref 0–35)
AST: 17 U/L (ref 0–37)
Albumin: 3.7 g/dL (ref 3.5–5.2)
Alkaline Phosphatase: 56 U/L (ref 39–117)
BILIRUBIN TOTAL: 0.3 mg/dL (ref 0.3–1.2)
BUN: 4 mg/dL — ABNORMAL LOW (ref 6–23)
CHLORIDE: 101 meq/L (ref 96–112)
CO2: 24 meq/L (ref 19–32)
CREATININE: 0.58 mg/dL (ref 0.50–1.10)
Calcium: 11 mg/dL — ABNORMAL HIGH (ref 8.4–10.5)
GFR calc Af Amer: >90 mL/min (ref >90)
Glucose, Bld: 91 mg/dL (ref 70–99)
Potassium: 3.6 mEq/L — ABNORMAL LOW (ref 3.7–5.3)
SODIUM: 138 meq/L (ref 137–147)
Total Protein: 7 g/dL (ref 6.0–8.3)

## 2013-11-11 LAB — URINE MICROSCOPIC-ADD ON

## 2013-11-11 LAB — MONONUCLEOSIS SCREEN: MONO SCREEN: NEGATIVE

## 2013-11-11 LAB — TSH: TSH: 0.943 u[IU]/mL (ref 0.350–4.500)

## 2013-11-11 LAB — GLUCOSE, CAPILLARY: GLUCOSE-CAPILLARY: 96 mg/dL (ref 70–99)

## 2013-11-11 MED ORDER — ACETAMINOPHEN 500 MG PO TABS
1000.0000 mg | ORAL_TABLET | Freq: Once | ORAL | Status: AC
  Administered 2013-11-11: 1000 mg via ORAL
  Filled 2013-11-11: qty 2

## 2013-11-11 MED ORDER — FAMOTIDINE 20 MG PO TABS
10.0000 mg | ORAL_TABLET | Freq: Once | ORAL | Status: AC
  Administered 2013-11-11: 10 mg via ORAL
  Filled 2013-11-11: qty 1

## 2013-11-11 MED ORDER — M.V.I. ADULT IV INJ
Freq: Once | INTRAVENOUS | Status: AC
  Administered 2013-11-11: 09:00:00 via INTRAVENOUS
  Filled 2013-11-11: qty 10

## 2013-11-11 MED ORDER — FAMOTIDINE 20 MG PO TABS: 20.0000 mg | ORAL_TABLET | Freq: Every day | ORAL | Status: DC

## 2013-11-11 MED ORDER — GI COCKTAIL ~~LOC~~
30.0000 mL | Freq: Once | ORAL | Status: AC
  Administered 2013-11-11: 30 mL via ORAL
  Filled 2013-11-11: qty 30

## 2013-11-11 NOTE — MAU Provider Note (Signed)
Chief Complaint: No chief complaint on file.   First Provider Initiated Contact with Patient 11/11/13 [phone]     SUBJECTIVE HPI: Debbie Walter is a 33 y.o. [id number] at [redacted]w[redacted]d by LMP who presents with fatigue, nausea and generally feeling bad. Worried that she might pass out and harm herself or the baby. Wondered if Sx are side effect of [place], but switched back to previous BP meds per MFM and doesn't feel any better. Last dose taken upon arrival to MAU. Reports the fatigue has been incapacitating, almost making it impossible to get of bed in the morning. Did not have this kind of fatigue with her last baby.  Also reports epigastric pain that is worse at night and interferes with sleep. Was instructed to take Pepcid 10 mg daily and takes in the morning. No relief of symptoms. Denies nausea and vomiting, but has no appetite. Ate one peanut butter and jelly sandwich and drank 2 large bottles of water yesterday.  Past Medical History  Diagnosis Date  . Panic attacks   . Hypertension   . Fibroids   . PCOS (polycystic ovarian syndrome)     takes metformin for this  . Kidney stones 5/14  . GERD (gastroesophageal reflux disease)     takes protonix   OB History  Gravida Para Term Preterm AB SAB TAB Ectopic Multiple Living  6 2 1  3 3   0  1    # Outcome Date GA Lbr Len/2nd Weight Sex Delivery Anes PTL Lv  6 CUR           5 PAR     F LTCS   Y     Comments: System Generated. Please review and update pregnancy details.  4 TRM           3 SAB           2 SAB           1 SAB              Past Surgical History  Procedure Laterality Date  . Parathyroidectomy    . Cesarean section    . Parathyroidectomy    . Hysteroscopy     History   Social History  . Marital Status: Married    Spouse Name: N/A    Number of Children: N/A  . Years of Education: N/A   Occupational History  . Not on file.   Social History Main Topics  . Smoking status: Never Smoker   . Smokeless tobacco: Not on file   . Alcohol Use: No  . Drug Use: No  . Sexual Activity: Not on file   Other Topics Concern  . Not on file   Social History Narrative  . No narrative on file   No current facility-administered medications on file prior to encounter.   Current Outpatient Prescriptions on File Prior to Encounter  Medication Sig Dispense Refill  . CALCIUM PO Take 1 tablet by mouth daily.      . Cholecalciferol (VITAMIN D PO) Take 1 tablet by mouth daily.      . famotidine (PEPCID) 10 MG tablet Take 10 mg by mouth daily.      . Prenatal Vit-Fe Fumarate-FA (PRENATAL MULTIVITAMIN) TABS Take 1 tablet by mouth every evening.       . methyldopa (ALDOMET) 500 MG tablet Take 1 tablet (500 mg total) by mouth 3 (three) times daily.  90 tablet  1   Allergies  Allergen Reactions  .  Labetalol Other (See Comments)    "makes me feel like things are crawling through my head"  . Procardia [Nifedipine] Other (See Comments)    headache  . Wellbutrin [Bupropion] Other (See Comments)    "Makes my blood pressure go up - I don't ever want to take it again"  . Zithromax [Azithromycin] Other (See Comments)    Gi upset    ROS: Pertinent items in HPI. Sore throat one day last week. Shortness of breath with exertion. Denies fever, chills, syncope, upper respiratory infection symptoms, hair loss, palpitations, chest pain, nausea, vomiting, cramping, vaginal bleeding.  OBJECTIVE Blood pressure 139/66, pulse 87, temperature 98.9 F (37.2 C), temperature source Oral, resp. rate 18, height 5\' 2"  (1.575 m), weight 88.508 kg (195 lb 2 oz), last menstrual period 08/26/2013. GENERAL: Well-developed, well-nourished, obese female in no acute distress. Tired-appearing. Tearful at times. HEENT: Normocephalic. No obvious thyromegaly, but exam limited by body habitus. No cervical lymphadenopathy. HEART: normal rate and rhythm.  no murmurs rubs or gallops. RESP: normal effort. Clear to Auscultation bilaterally. ABDOMEN: Soft,  non-tender NEURO: Alert and oriented SPECULUM EXAM: Deferred FHR 176 by doppler.   LAB RESULTS Results for orders placed during the hospital encounter of 11/11/13 (from the past 24 hour(s))  URINALYSIS, ROUTINE W REFLEX MICROSCOPIC     Status: Abnormal   Collection Time    11/11/13  6:34 AM      Result Value Ref Range   Color, Urine YELLOW  YELLOW   APPearance HAZY (*) CLEAR   Specific Gravity, Urine 1.010  1.005 - 1.030   pH 6.5  5.0 - 8.0   Glucose, UA NEGATIVE  NEGATIVE mg/dL   Hgb urine dipstick TRACE (*) NEGATIVE   Bilirubin Urine NEGATIVE  NEGATIVE   Ketones, ur 15 (*) NEGATIVE mg/dL   Protein, ur NEGATIVE  NEGATIVE mg/dL   Urobilinogen, UA 0.2  0.0 - 1.0 mg/dL   Nitrite NEGATIVE  NEGATIVE   Leukocytes, UA NEGATIVE  NEGATIVE  URINE MICROSCOPIC-ADD ON     Status: Abnormal   Collection Time    11/11/13  6:34 AM      Result Value Ref Range   Squamous Epithelial / LPF MANY (*) RARE   RBC / HPF 0-2  <3 RBC/hpf   Bacteria, UA RARE  RARE  GLUCOSE, CAPILLARY     Status: None   Collection Time    11/11/13  7:13 AM      Result Value Ref Range   Glucose-Capillary 96  70 - 99 mg/dL  COMPREHENSIVE METABOLIC PANEL     Status: Abnormal   Collection Time    11/11/13  8:28 AM      Result Value Ref Range   Sodium 138  137 - 147 mEq/L   Potassium 3.6 (*) 3.7 - 5.3 mEq/L   Chloride 101  96 - 112 mEq/L   CO2 24  19 - 32 mEq/L   Glucose, Bld 91  70 - 99 mg/dL   BUN 4 (*) 6 - 23 mg/dL   Creatinine, Ser 0.58  0.50 - 1.10 mg/dL   Calcium 11.0 (*) 8.4 - 10.5 mg/dL   Total Protein 7.0  6.0 - 8.3 g/dL   Albumin 3.7  3.5 - 5.2 g/dL   AST 17  0 - 37 U/L   ALT 24  0 - 35 U/L   Alkaline Phosphatase 56  39 - 117 U/L   Total Bilirubin 0.3  0.3 - 1.2 mg/dL   GFR calc non Af  Amer >90  >90 mL/min   GFR calc Af Amer >90  >90 mL/min  MONONUCLEOSIS SCREEN     Status: None   Collection Time    11/11/13  8:28 AM      Result Value Ref Range   Mono Screen NEGATIVE  NEGATIVE  CBC     Status:  Abnormal   Collection Time    11/11/13  9:35 AM      Result Value Ref Range   WBC 9.6  4.0 - 10.5 K/uL   RBC 4.72  3.87 - 5.11 MIL/uL   Hemoglobin 12.7  12.0 - 15.0 g/dL   HCT 36.4  36.0 - 46.0 %   MCV 77.1 (*) 78.0 - 100.0 fL   MCH 26.9  26.0 - 34.0 pg   MCHC 34.9  30.0 - 36.0 g/dL   RDW 13.9  11.5 - 15.5 %   Platelets 293  150 - 400 K/uL   IMAGING No results found.  MAU COURSE CBC, CVAT, TSH, vitamin D level, Monospot, GI cocktail per consult with Dr. Corinna Capra. Will encourage patient to have snack and juice. Discussed the incapacitating nature of patient's fatigue with Dr. Corinna Capra in suspicion that there may be a mood component to her symptoms as well. Recommend the patient followup with her primary OB in the office to review these results. May offer patient IV fluids in MAU as needed. Multivitamin w/ D5LR ordered per pt request. Pt has a history of GERD and currently takes Pepcid 10 mg PO daily; patient describes her symptoms as the same, not worse today.   Care of pt turned over to Noni Saupe, NP at [phone].  [place], [place] 11/11/2013  8:38 AM   1016: Pt admits to be "depressed and stuck in the house with a 47.34 year old". Pt says she is "tired of being tired". Pt requests tylenol for a HA and an additional dose of pepid; pt took 10 mg this morning at home. I recommended she have a conversation with her Dr. Regarding her depression symptoms if they do not improve. Pt denies thoughts of harm to herself or anyone else.   Pt feels HA has much improved following tylenol.  Mono screen negative  A:  1. Fatigue during pregnancy in first trimester   2. GERD (gastroesophageal reflux disease)    P;  Discharge home in stable condition Increase Pepcid to 20 mg daily Mono test negative Myself or the office will call the patient with results of pending labs Return to MAU as needed, if symptoms worsen  Ronnald Ramp, NP 11/11/2013 11:51 AM

## 2013-11-11 NOTE — MAU Note (Addendum)
PT SAYS SHE FOUND OUT PREG IN JAN.   SAYS HAS BEEN FEELING BAD - THOUGHT IT WAS FROM ALDOMET. -  SINCE OFF- DOES FEEL SOME BETTER    FEELS NAUSEA- BUT NOT  VOMITED.   SHE HAS TOLD DR-  THOUGHT IT WAS FROM BP MED.      SAYS SHE TOOK BP MEDS REALLY IN B-ROOM HERE IN MAU.

## 2013-11-11 NOTE — Discharge Instructions (Signed)

## 2013-11-12 LAB — VITAMIN D 25 HYDROXY (VIT D DEFICIENCY, FRACTURES): Vit D, 25-Hydroxy: 60 ng/mL (ref 30–89)

## 2013-12-24 ENCOUNTER — Other Ambulatory Visit (HOSPITAL_COMMUNITY): Payer: Self-pay | Admitting: Maternal and Fetal Medicine

## 2013-12-24 DIAGNOSIS — O169 Unspecified maternal hypertension, unspecified trimester: Secondary | ICD-10-CM

## 2014-01-01 ENCOUNTER — Ambulatory Visit (HOSPITAL_COMMUNITY)
Admission: RE | Admit: 2014-01-01 | Discharge: 2014-01-01 | Disposition: A | Discharge status: Home / Self Care | Payer: Federal, State, Local not specified - PPO | Source: Ambulatory Visit | Attending: Obstetrics and Gynecology | Admitting: Obstetrics and Gynecology

## 2014-01-01 ENCOUNTER — Encounter (HOSPITAL_COMMUNITY): Payer: Self-pay

## 2014-01-01 DIAGNOSIS — Z363 Encounter for antenatal screening for malformations: Secondary | ICD-10-CM | POA: Insufficient documentation

## 2014-01-01 DIAGNOSIS — O169 Unspecified maternal hypertension, unspecified trimester: Secondary | ICD-10-CM

## 2014-01-01 DIAGNOSIS — O99210 Obesity complicating pregnancy, unspecified trimester: Secondary | ICD-10-CM

## 2014-01-01 DIAGNOSIS — O34219 Maternal care for unspecified type scar from previous cesarean delivery: Secondary | ICD-10-CM | POA: Insufficient documentation

## 2014-01-01 DIAGNOSIS — Z1389 Encounter for screening for other disorder: Secondary | ICD-10-CM | POA: Insufficient documentation

## 2014-01-01 DIAGNOSIS — O09529 Supervision of elderly multigravida, unspecified trimester: Secondary | ICD-10-CM | POA: Insufficient documentation

## 2014-01-01 DIAGNOSIS — E669 Obesity, unspecified: Secondary | ICD-10-CM | POA: Insufficient documentation

## 2014-01-01 DIAGNOSIS — O358XX0 Maternal care for other (suspected) fetal abnormality and damage, not applicable or unspecified: Secondary | ICD-10-CM | POA: Insufficient documentation

## 2014-01-01 DIAGNOSIS — O10019 Pre-existing essential hypertension complicating pregnancy, unspecified trimester: Secondary | ICD-10-CM | POA: Insufficient documentation

## 2014-01-12 ENCOUNTER — Inpatient Hospital Stay (HOSPITAL_COMMUNITY)
Admission: AD | Admit: 2014-01-12 | Discharge: 2014-01-12 | Disposition: A | Discharge status: Home / Self Care | Payer: Federal, State, Local not specified - PPO | Source: Ambulatory Visit | Attending: Obstetrics and Gynecology | Admitting: Obstetrics and Gynecology

## 2014-01-12 ENCOUNTER — Encounter (HOSPITAL_COMMUNITY): Payer: Self-pay | Admitting: *Deleted

## 2014-01-12 DIAGNOSIS — IMO0001 Reserved for inherently not codable concepts without codable children: Secondary | ICD-10-CM | POA: Insufficient documentation

## 2014-01-12 DIAGNOSIS — M549 Dorsalgia, unspecified: Secondary | ICD-10-CM | POA: Insufficient documentation

## 2014-01-12 DIAGNOSIS — F410 Panic disorder [episodic paroxysmal anxiety] without agoraphobia: Secondary | ICD-10-CM | POA: Insufficient documentation

## 2014-01-12 DIAGNOSIS — O169 Unspecified maternal hypertension, unspecified trimester: Secondary | ICD-10-CM | POA: Insufficient documentation

## 2014-01-12 DIAGNOSIS — O99891 Other specified diseases and conditions complicating pregnancy: Secondary | ICD-10-CM | POA: Insufficient documentation

## 2014-01-12 DIAGNOSIS — K219 Gastro-esophageal reflux disease without esophagitis: Secondary | ICD-10-CM | POA: Insufficient documentation

## 2014-01-12 DIAGNOSIS — W108XXA Fall (on) (from) other stairs and steps, initial encounter: Secondary | ICD-10-CM | POA: Insufficient documentation

## 2014-01-12 DIAGNOSIS — Y92009 Unspecified place in unspecified non-institutional (private) residence as the place of occurrence of the external cause: Secondary | ICD-10-CM | POA: Insufficient documentation

## 2014-01-12 DIAGNOSIS — O9989 Other specified diseases and conditions complicating pregnancy, childbirth and the puerperium: Principal | ICD-10-CM

## 2014-01-12 DIAGNOSIS — W19XXXA Unspecified fall, initial encounter: Secondary | ICD-10-CM

## 2014-01-12 LAB — URINALYSIS, ROUTINE W REFLEX MICROSCOPIC
Bilirubin Urine: NEGATIVE
Glucose, UA: NEGATIVE mg/dL
Ketones, ur: 15 mg/dL — AB
LEUKOCYTES UA: NEGATIVE
NITRITE: NEGATIVE
Protein, ur: NEGATIVE mg/dL
SPECIFIC GRAVITY, URINE: 1.01 (ref 1.005–1.030)
UROBILINOGEN UA: 0.2 mg/dL (ref 0.0–1.0)
pH: 6 (ref 5.0–8.0)

## 2014-01-12 LAB — URINE MICROSCOPIC-ADD ON

## 2014-01-12 NOTE — MAU Provider Note (Signed)
First Provider Initiated Contact with Patient 01/12/14 1825      Chief Complaint:  Debbie Walter is  33 y.o. [id number] at [redacted]w[redacted]d presents complaining of Fall  patient is walking downstairs when she fell down the last 5 steps. Patient fell backwards on on her bottom.patient denies any abdominal trauma . Patient also denies hitting her head. Patient states that she was not pushed and that she was at home alone. Patient states she feels safe at home. Patient denies loss of consciousness prior or after the fall. Patient states she is not having any contractions, vaginal bleeding, or abdominal pain. Patient states that her back and bottom or sore she has good range of motion of her lower charities and no focal deficits.  Obstetrical/Gynecological History: OB History   Grav Para Term Preterm Abortions TAB SAB Ect Mult Living   5 1 1  3  2 1  1      Past Medical History: Past Medical History  Diagnosis Date  . Panic attacks   . Hypertension   . Fibroids   . PCOS (polycystic ovarian syndrome)     takes metformin for this  . Kidney stones 5/14  . GERD (gastroesophageal reflux disease)     takes protonix    Past Surgical History: Past Surgical History  Procedure Laterality Date  . Parathyroidectomy    . Cesarean section    . Parathyroidectomy    . Hysteroscopy      Family History: Family History  Problem Relation Age of Onset  . Anesthesia problems Neg Hx   . Hypotension Neg Hx   . Malignant hyperthermia Neg Hx   . Pseudochol deficiency Neg Hx     Social History: History  Substance Use Topics  . Smoking status: Never Smoker   . Smokeless tobacco: Never Used  . Alcohol Use: No    Allergies:  Allergies  Allergen Reactions  . Labetalol Other (See Comments)    "makes me feel like things are crawling through my head"  . Procardia [Nifedipine] Other (See Comments)    headache  . Wellbutrin [Bupropion] Other (See Comments)    "Makes my blood pressure go up - I don't  ever want to take it again"  . Zithromax [Azithromycin] Other (See Comments)    Gi upset    Meds:  Prescriptions prior to admission  Medication Sig Dispense Refill  . CALCIUM PO Take 1 tablet by mouth daily.      . Cholecalciferol (VITAMIN D PO) Take 1 tablet by mouth daily.      . famotidine (PEPCID) 20 MG tablet Take 1 tablet (20 mg total) by mouth daily.  30 tablet  1  . metFORMIN (GLUMETZA) 500 MG (MOD) 24 hr tablet Take 500 mg by mouth daily with breakfast.      . methyldopa (ALDOMET) 500 MG tablet Take 1 tablet (500 mg total) by mouth 3 (three) times daily.  90 tablet  1  . metoprolol tartrate (LOPRESSOR) 25 MG tablet Take 25 mg by mouth 2 (two) times daily.      . Prenatal Vit-Fe Fumarate-FA (PRENATAL MULTIVITAMIN) TABS Take 1 tablet by mouth every evening.       . verapamil (CALAN) 120 MG tablet Take 120 mg by mouth 2 (two) times daily.        Review of Systems -   Review of Systems  Patient in usual state of health. See history of present illness for complaints. Denies headaches, vision changes, chest pain,  shortness breath, nausea, vomiting, diarrhea, constipation or other concerning symptoms at this time.    Physical Exam  Blood pressure 141/73, pulse 98, temperature 98.4 F (36.9 C), temperature source Oral, resp. rate 18, height 5\' 1"  (1.549 m), weight 91.445 kg (201 lb 9.6 oz), last menstrual period 08/26/2013, SpO2 98.00%. GENERAL: Well-developed, well-nourished female in no acute distress.  BACK: no spinal tenderness, no CVAT, mildly sacral tenderness and bilateral lower back tenderness. ABDOMEN: Soft, nontender, nondistended, gravid.  EXTREMITIES: Nontender, 1+ edema, 2+ distal pulses. DTR's 2+  Dilation: Closed Effacement (%): Thick Exam by:: Zakhai Meisinger No blood on vaginal exam  FHT:  Baseline rate 154 bpm   Labs: No results found for this or any previous visit (from the past 24 hour(s)). Imaging Studies:  US Ob Detail + 14 Wk  01/01/2014   OBSTETRICAL  ULTRASOUND: This exam was performed within a [facility]. The OB US report was generated in the [facility] system, and faxed to the ordering physician.   This report is also available in [employer] and in the [employer]. See [facility] Obstetric US report.   Assessment: Debbie Walter is  33 y.o. [phone] at [redacted]w[redacted]d presents with fall without abdominal trauma. No evidence of labor or loss of current pregnancy. Abruption and PTL precautions reviewed. Follow up in office for [place] as previously scheduled. Discussed with Dr. Cherene Julian 4/27/20156:33 PM

## 2014-01-12 NOTE — Discharge Instructions (Signed)
Injuries In Pregnancy Trauma is the most common cause of injury and death in pregnant women. The most common cause of death to the fetus is injury and death of the pregnant mother.  Minor falls and minor automobile accidents do not usually harm the fetus. The fetus is protected in the womb by a sac filled with fluid. The fetus can be harmed if there is direct trauma to your abdomen and pelvis. Direct trauma to the uterus and placenta can affect the blood supply to the fetus. Major trauma causing significant bleeding and shock to the mother can also compromise and jeopardize the fetal blood supply. It is important to know your blood type and the father's blood type in case you develop vaginal bleeding. If you are RH negative and have sustained serious trauma or develop vaginal bleeding, you will need to have medicine (RhoGAM [Rh immune globulin]) to avoid Rh problems in future pregnancies. CAUSES  Falls are more common in the second and third trimester of the pregnancy. Factors that increase your risk of falling include:  Increase in weight.  The change of your center of gravity.  Tripping over an object that cannot be seen.  Automobile accidents. It is important to wear a seat belt and always practice safe driving.  Domestic violence or assault. Dial your local emergency services (911 in the [place]). Spousal abuse can be a significant cause of trauma during pregnancy.  Burns (Building services engineer). Avoid fires, starting fires, lifting heavy pots of boiling or hot liquids, and fixing electrical problems. The most common causes of death to the pregnant woman include:  Injuries that cause severe bleeding, shock and loss of blood flow to the mother's major organs.  Head and neck injuries that result in severe brain or spinal damage.  Chest trauma that can cause direct injury to the heart and lungs or any injury that effects the area enclosed by the ribs (thorax). Trauma to this area can result in  cardio-respiratory arrest. Symptoms and treatment will depend on the type of injury. HOME CARE INSTRUCTIONS   Call your caregiver if you are in a car accident, even if you think you and the baby are not hurt. Your caregiver may want you to have a precautionary evaluation.  Do not take aspirin. It can worsen bleeding.  You may apply cold packs 3 to 4 times a day to the injury with your caregiver's permission.  After 24 hours apply warm compresses to the injured site with your caregiver's permission.  Call your caregiver if you are having increasing pain in any part of your body that is not remedied by your instructed home care.  In a severe injury, try to have someone be with you and help you until you are able to take care of yourself.  Do not wear high heel shoes while pregnant.  Remove slippery rugs and loose objects on the floor. SEEK IMMEDIATE MEDICAL CARE IF:   You have been assaulted (domestic or otherwise).  You have been in a car accident.  You develop vaginal bleeding.  You develop fluid leaking from the vagina.  You develop uterine contractions (pelvic cramping or pain).  You develop neck stiffness or pain.  You become weak or faint, or have uncontrolled vomiting after trauma.  You had a serious burn. This includes burns to the face, neck, hands or genitals, or burns greater than the size of your palm anywhere else.  You develop a headache or vision problems after a fall or from  other trauma.  You do not feel the baby moving or the baby is not moving as much as before. Document Released: 10/12/2004 Document Revised: 11/27/2011 Document Reviewed: 06/11/2013 [facility] Patient Information 2014 [place], [place].

## 2014-01-12 NOTE — MAU Note (Signed)
Patient states she was going down the stairs and fell on her back and buttocks down about 5 stairs around 1500. States she is having back pain that is getting worse. Denies bleeding or discharge.

## 2014-02-11 ENCOUNTER — Inpatient Hospital Stay (HOSPITAL_COMMUNITY)
Admission: AD | Admit: 2014-02-11 | Discharge: 2014-02-11 | Disposition: A | Discharge status: Home / Self Care | Payer: Federal, State, Local not specified - PPO | Source: Ambulatory Visit | Attending: Obstetrics and Gynecology | Admitting: Obstetrics and Gynecology

## 2014-02-11 ENCOUNTER — Encounter (HOSPITAL_COMMUNITY): Payer: Self-pay | Admitting: *Deleted

## 2014-02-11 DIAGNOSIS — E876 Hypokalemia: Secondary | ICD-10-CM | POA: Insufficient documentation

## 2014-02-11 DIAGNOSIS — K219 Gastro-esophageal reflux disease without esophagitis: Secondary | ICD-10-CM | POA: Insufficient documentation

## 2014-02-11 DIAGNOSIS — O99891 Other specified diseases and conditions complicating pregnancy: Secondary | ICD-10-CM | POA: Insufficient documentation

## 2014-02-11 DIAGNOSIS — O9989 Other specified diseases and conditions complicating pregnancy, childbirth and the puerperium: Secondary | ICD-10-CM

## 2014-02-11 DIAGNOSIS — O10919 Unspecified pre-existing hypertension complicating pregnancy, unspecified trimester: Secondary | ICD-10-CM

## 2014-02-11 DIAGNOSIS — O10019 Pre-existing essential hypertension complicating pregnancy, unspecified trimester: Secondary | ICD-10-CM

## 2014-02-11 DIAGNOSIS — E282 Polycystic ovarian syndrome: Secondary | ICD-10-CM | POA: Insufficient documentation

## 2014-02-11 LAB — COMPREHENSIVE METABOLIC PANEL
ALBUMIN: 3.1 g/dL — AB (ref 3.5–5.2)
ALT: 15 U/L (ref 0–35)
AST: 14 U/L (ref 0–37)
Alkaline Phosphatase: 53 U/L (ref 39–117)
BUN: 5 mg/dL — AB (ref 6–23)
CALCIUM: 11.4 mg/dL — AB (ref 8.4–10.5)
CO2: 25 mEq/L (ref 19–32)
CREATININE: 0.53 mg/dL (ref 0.50–1.10)
Chloride: 101 mEq/L (ref 96–112)
GFR calc Af Amer: >90 mL/min (ref >90)
GFR calc non Af Amer: >90 mL/min (ref >90)
Glucose, Bld: 88 mg/dL (ref 70–99)
Potassium: 3.1 mEq/L — ABNORMAL LOW (ref 3.7–5.3)
Sodium: 139 mEq/L (ref 137–147)
Total Protein: 6.8 g/dL (ref 6.0–8.3)

## 2014-02-11 LAB — PROTEIN / CREATININE RATIO, URINE
Creatinine, Urine: 63.23 mg/dL
PROTEIN CREATININE RATIO: 0.12 (ref 0.00–0.15)
Total Protein, Urine: 7.3 mg/dL

## 2014-02-11 LAB — CBC
HEMATOCRIT: 30.4 % — AB (ref 36.0–46.0)
Hemoglobin: 10.5 g/dL — ABNORMAL LOW (ref 12.0–15.0)
MCH: 27.6 pg (ref 26.0–34.0)
MCHC: 34.5 g/dL (ref 30.0–36.0)
MCV: 79.8 fL (ref 78.0–100.0)
PLATELETS: 237 10*3/uL (ref 150–400)
RBC: 3.81 MIL/uL — ABNORMAL LOW (ref 3.87–5.11)
RDW: 14 % (ref 11.5–15.5)
WBC: 10.8 10*3/uL — ABNORMAL HIGH (ref 4.0–10.5)

## 2014-02-11 LAB — URINALYSIS, ROUTINE W REFLEX MICROSCOPIC
BILIRUBIN URINE: NEGATIVE
Glucose, UA: NEGATIVE mg/dL
Ketones, ur: 15 mg/dL — AB
LEUKOCYTES UA: NEGATIVE
NITRITE: NEGATIVE
PH: 6.5 (ref 5.0–8.0)
Protein, ur: NEGATIVE mg/dL
Specific Gravity, Urine: 1.01 (ref 1.005–1.030)
UROBILINOGEN UA: 0.2 mg/dL (ref 0.0–1.0)

## 2014-02-11 LAB — LACTATE DEHYDROGENASE: LDH: 130 U/L (ref 94–250)

## 2014-02-11 LAB — URINE MICROSCOPIC-ADD ON

## 2014-02-11 LAB — URIC ACID: URIC ACID, SERUM: 5.3 mg/dL (ref 2.4–7.0)

## 2014-02-11 NOTE — MAU Note (Signed)
Patient states she has a history of HTN on on multiple medications. Went to [facility] today to make sure she was using the right cuff and they took her BP and it was 180/100. States she has had off and on headaches for a little over one week. Denies contractions, bleeding, leaking and reports good fetal movement.

## 2014-02-11 NOTE — MAU Provider Note (Signed)
History     CSN: [id number]  Arrival date and time: 02/11/14 1757   First Provider Initiated Contact with Patient 02/11/14 1920      Chief Complaint  Patient presents with  . Hypertension  . Headache   HPI Debbie Walter is a 33 y.o. [id number] at [redacted]w[redacted]d who presents to MAU today with complaint of elevated BP. The patient has chronic HTN and is on 2 anti-HTN medications currently. She states that she was in the office yesterday and told that her home BP cuff may be too small. She went to [facility] today for a new cuff and they took her BP and told her that it was 180/100. They advised her to come to MAU. She states no headache now, but mild headache off and on x 1 week. She has had blurred vision that is unchanged. She denies floaters. She denies RUQ pain. She states bilaterally LE edema of the ankles. She denies contractions, vaginal bleeding or LOF. She reports good fetal movement.   OB History   Grav Para Term Preterm Abortions TAB SAB Ect Mult Living   5 1 1  3  2 1  1       Past Medical History  Diagnosis Date  . Panic attacks   . Hypertension   . Fibroids   . PCOS (polycystic ovarian syndrome)     takes metformin for this  . Kidney stones 5/14  . GERD (gastroesophageal reflux disease)     takes protonix    Past Surgical History  Procedure Laterality Date  . Parathyroidectomy    . Cesarean section    . Parathyroidectomy    . Hysteroscopy      Family History  Problem Relation Age of Onset  . Anesthesia problems Neg Hx   . Hypotension Neg Hx   . Malignant hyperthermia Neg Hx   . Pseudochol deficiency Neg Hx     History  Substance Use Topics  . Smoking status: Never Smoker   . Smokeless tobacco: Never Used  . Alcohol Use: No    Allergies:  Allergies  Allergen Reactions  . Labetalol Other (See Comments)    "makes me feel like things are crawling through my head"  . Procardia [Nifedipine] Other (See Comments)    headache  . Wellbutrin  [Bupropion] Other (See Comments)    "Makes my blood pressure go up - I don't ever want to take it again"  . Zithromax [Azithromycin] Other (See Comments)    Gi upset    No prescriptions prior to admission    Review of Systems  Constitutional: Negative for malaise/fatigue.  Eyes: Positive for blurred vision. Negative for double vision.  Cardiovascular: Negative for chest pain.  Gastrointestinal: Negative for abdominal pain.  Genitourinary:       Neg - vaginal bleeding, LOF  Neurological: Negative for headaches.   Physical Exam   Blood pressure 141/62, pulse 89, temperature 98.8 F (37.1 C), temperature source Oral, resp. rate 18, height 5' 1.5" (1.562 m), weight 200 lb (90.719 kg), last menstrual period 08/26/2013, SpO2 98.00%.  Physical Exam  Constitutional: She appears well-developed and well-nourished. No distress.  HENT:  Head: Normocephalic and atraumatic.  Cardiovascular: Normal rate.   Respiratory: Effort normal.  GI: Soft. Bowel sounds are normal. She exhibits no distension and no mass. There is no tenderness. There is no rebound and no guarding.  Musculoskeletal: She exhibits no edema.  Neurological: She is alert.  Reflex Scores:  Bicep reflexes are 3+ on the right side and 3+ on the left side.      Brachioradialis reflexes are 3+ on the right side and 3+ on the left side.      Patellar reflexes are 3+ on the right side and 3+ on the left side.      Achilles reflexes are 3+ on the right side and 3+ on the left side. No clonus  Skin: Skin is warm and dry. No erythema.  Psychiatric: She has a normal mood and affect.   Results for orders placed during the hospital encounter of 02/11/14 (from the past 24 hour(s))  URINALYSIS, ROUTINE W REFLEX MICROSCOPIC     Status: Abnormal   Collection Time    02/11/14  6:01 PM      Result Value Ref Range   Color, Urine YELLOW  YELLOW   APPearance CLEAR  CLEAR   Specific Gravity, Urine 1.010  1.005 - 1.030   pH 6.5  5.0 -  8.0   Glucose, UA NEGATIVE  NEGATIVE mg/dL   Hgb urine dipstick TRACE (*) NEGATIVE   Bilirubin Urine NEGATIVE  NEGATIVE   Ketones, ur 15 (*) NEGATIVE mg/dL   Protein, ur NEGATIVE  NEGATIVE mg/dL   Urobilinogen, UA 0.2  0.0 - 1.0 mg/dL   Nitrite NEGATIVE  NEGATIVE   Leukocytes, UA NEGATIVE  NEGATIVE  PROTEIN / CREATININE RATIO, URINE     Status: None   Collection Time    02/11/14  6:01 PM      Result Value Ref Range   Creatinine, Urine 63.23     Total Protein, Urine 7.3     PROTEIN CREATININE RATIO 0.12  0.00 - 0.15  URINE MICROSCOPIC-ADD ON     Status: Abnormal   Collection Time    02/11/14  6:01 PM      Result Value Ref Range   Squamous Epithelial / LPF MANY (*) RARE   Bacteria, UA FEW (*) RARE   Crystals CA OXALATE CRYSTALS (*) NEGATIVE   Urine-Other STARCH CRYSTALS PRESENT    CBC     Status: Abnormal   Collection Time    02/11/14  6:50 PM      Result Value Ref Range   WBC 10.8 (*) 4.0 - 10.5 K/uL   RBC 3.81 (*) 3.87 - 5.11 MIL/uL   Hemoglobin 10.5 (*) 12.0 - 15.0 g/dL   HCT 30.4 (*) 36.0 - 46.0 %   MCV 79.8  78.0 - 100.0 fL   MCH 27.6  26.0 - 34.0 pg   MCHC 34.5  30.0 - 36.0 g/dL   RDW 14.0  11.5 - 15.5 %   Platelets 237  150 - 400 K/uL  COMPREHENSIVE METABOLIC PANEL     Status: Abnormal   Collection Time    02/11/14  6:50 PM      Result Value Ref Range   Sodium 139  137 - 147 mEq/L   Potassium 3.1 (*) 3.7 - 5.3 mEq/L   Chloride 101  96 - 112 mEq/L   CO2 25  19 - 32 mEq/L   Glucose, Bld 88  70 - 99 mg/dL   BUN 5 (*) 6 - 23 mg/dL   Creatinine, Ser 0.53  0.50 - 1.10 mg/dL   Calcium 11.4 (*) 8.4 - 10.5 mg/dL   Total Protein 6.8  6.0 - 8.3 g/dL   Albumin 3.1 (*) 3.5 - 5.2 g/dL   AST 14  0 - 37 U/L   ALT 15  0 -  35 U/L   Alkaline Phosphatase 53  39 - 117 U/L   Total Bilirubin <0.2 (*) 0.3 - 1.2 mg/dL   GFR calc non Af Amer >90  >90 mL/min   GFR calc Af Amer >90  >90 mL/min  URIC ACID     Status: None   Collection Time    02/11/14  6:50 PM      Result Value  Ref Range   Uric Acid, Serum 5.3  2.4 - 7.0 mg/dL  LACTATE DEHYDROGENASE     Status: None   Collection Time    02/11/14  6:50 PM      Result Value Ref Range   LDH 130  94 - 250 U/L   Patient Vitals for the past 24 hrs:  BP Temp Temp src Pulse Resp SpO2 Height Weight  02/11/14 2002 141/62 mmHg - - 89 18 - - -  02/11/14 1826 148/79 mmHg 98.8 F (37.1 C) Oral 93 20 98 % 5' 1.5" (1.562 m) 200 lb (90.719 kg)    Fetal monitoring 140 bpm, moderate variability, + accelerations, no decelerations Contractions: none MAU Course  Procedures None  MDM Discussed with Dr. Julien Girt. [place] for discharge. Office tomorrow as planned  Assessment and Plan  A: Chronic HTN in pregnancy Hypokalemia  P: Discharge home Warning signs for pre-eclampsia discussed Patient advised to increase potassium in her diet as she is asymptomatic  Patient advised to follow-up in the office tomorrow as scheduled Patient may return to MAU as needed or if her condition were to change or worsen  Farris Has, PA-C  02/11/2014, 8:58 PM

## 2014-02-11 NOTE — Discharge Instructions (Signed)
Hypertension During Pregnancy Hypertension is also called high blood pressure. Blood pressure moves blood in your body. Sometimes, the force that moves the blood becomes too strong. When you are pregnant, this condition should be watched carefully. It can cause problems for you and your baby. HOME CARE   Make and keep all of your doctor visits.  Take medicine as told by your doctor. Tell your doctor about all medicines you take.  Eat very little salt.  Exercise regularly.  Do not drink alcohol.  Do not smoke.  Do not have drinks with caffeine.  Lie on your left side when resting. GET HELP RIGHT AWAY IF:  You have bad belly (abdominal) pain.  You have sudden puffiness (swelling) in the hands, ankles, or face.  You gain 4 pounds (1.8 kilograms) or more in 1 week.  You throw up (vomit) repeatedly.  You have bleeding from the vagina.  You do not feel the baby moving as much.  You have a headache.  You have blurred or double vision.  You have muscle twitching or spasms.  You have shortness of breath.  You have blue fingernails and lips.  You have blood in your pee (urine). MAKE SURE YOU:  Understand these instructions.  Will watch your condition.  Will get help right away if you are not doing well or get worse. Document Released: 10/07/2010 Document Revised: 06/25/2013 Document Reviewed: 04/03/2013 [facility] Patient Information 2014 [place], [place].

## 2014-04-20 ENCOUNTER — Inpatient Hospital Stay (HOSPITAL_COMMUNITY)
Admission: AD | Admit: 2014-04-20 | Discharge: 2014-04-20 | Disposition: A | Discharge status: Home / Self Care | Payer: Federal, State, Local not specified - PPO | Source: Ambulatory Visit | Attending: Obstetrics & Gynecology | Admitting: Obstetrics & Gynecology

## 2014-04-20 ENCOUNTER — Encounter (HOSPITAL_COMMUNITY): Payer: Self-pay | Admitting: *Deleted

## 2014-04-20 DIAGNOSIS — K219 Gastro-esophageal reflux disease without esophagitis: Secondary | ICD-10-CM | POA: Insufficient documentation

## 2014-04-20 DIAGNOSIS — E282 Polycystic ovarian syndrome: Secondary | ICD-10-CM | POA: Insufficient documentation

## 2014-04-20 DIAGNOSIS — O10913 Unspecified pre-existing hypertension complicating pregnancy, third trimester: Secondary | ICD-10-CM

## 2014-04-20 DIAGNOSIS — O10019 Pre-existing essential hypertension complicating pregnancy, unspecified trimester: Secondary | ICD-10-CM

## 2014-04-20 DIAGNOSIS — F410 Panic disorder [episodic paroxysmal anxiety] without agoraphobia: Secondary | ICD-10-CM | POA: Insufficient documentation

## 2014-04-20 LAB — URINALYSIS, ROUTINE W REFLEX MICROSCOPIC
BILIRUBIN URINE: NEGATIVE
Glucose, UA: NEGATIVE mg/dL
Hgb urine dipstick: NEGATIVE
Ketones, ur: NEGATIVE mg/dL
Leukocytes, UA: NEGATIVE
NITRITE: NEGATIVE
PH: 6 (ref 5.0–8.0)
Protein, ur: NEGATIVE mg/dL
Urobilinogen, UA: 0.2 mg/dL (ref 0.0–1.0)

## 2014-04-20 NOTE — MAU Note (Signed)
Urine in lab 

## 2014-04-20 NOTE — MAU Note (Signed)
Patient presents with complaint of high blood pressure. She is taking her BP at home and recorded 185/99 and 176/94 today. Called the office and was told to come to MAU.

## 2014-04-20 NOTE — MAU Provider Note (Signed)
History     CSN: [id number]  Arrival date and time: 04/20/14 1609   None     Chief Complaint  Patient presents with  . Hypertension   HPI Debbie Walter is 33 y.o. [phone] [redacted]w[redacted]d weeks presenting with elevated blood pressures at home.  She called office and directed here.  She has been here several times for BP evals.  She states she if frustrated because in the office the BP readings are high and she comes in here and they are WNL.  She understands it is to make sure she and the baby are doing well.   She is a patient of Dr. Marshell Levan, last seen last week.  BP at that time was 170/90.  She is taking Verapamil and Lopressor.  She has headache now.  Thinks it is sinus related because it is over her ears--took Claritin helps a little.  Has mild swelling in her ankles.     Past Medical History  Diagnosis Date  . Panic attacks   . Hypertension   . Fibroids   . PCOS (polycystic ovarian syndrome)     takes metformin for this  . Kidney stones 5/14  . GERD (gastroesophageal reflux disease)     takes protonix    Past Surgical History  Procedure Laterality Date  . Parathyroidectomy    . Cesarean section    . Parathyroidectomy    . Hysteroscopy      Family History  Problem Relation Age of Onset  . Anesthesia problems Neg Hx   . Hypotension Neg Hx   . Malignant hyperthermia Neg Hx   . Pseudochol deficiency Neg Hx     History  Substance Use Topics  . Smoking status: Never Smoker   . Smokeless tobacco: Never Used  . Alcohol Use: No    Allergies:  Allergies  Allergen Reactions  . Procardia [Nifedipine] Other (See Comments)    headache  . Wellbutrin [Bupropion] Other (See Comments)    "Makes my blood pressure go up - I don't ever want to take it again"  . Labetalol Other (See Comments)    "makes me feel like things are crawling through my head"  . Zithromax [Azithromycin] Other (See Comments)    Gi upset.  Diarrhea    Prescriptions prior to admission  Medication Sig  Dispense Refill  . acetaminophen (TYLENOL) 500 MG tablet Take 1,000 mg by mouth every 6 (six) hours as needed for headache.      Marland Kitchen CALCIUM PO Take 1 tablet by mouth daily.      . Cholecalciferol (VITAMIN D PO) Take 1 tablet by mouth daily.      . famotidine (PEPCID) 20 MG tablet Take 40 mg by mouth daily.      Marland Kitchen Fe Cbn-Fe Gluc-FA-B12-C-DSS (FERRALET 90 PO) Take 1 tablet by mouth.      . loratadine (CLARITIN) 10 MG tablet Take 10 mg by mouth daily as needed for allergies.      . metFORMIN (GLUMETZA) 500 MG (MOD) 24 hr tablet Take 500 mg by mouth daily with breakfast.      . metoprolol tartrate (LOPRESSOR) 25 MG tablet Take 25 mg by mouth daily after supper.       . metoprolol tartrate (LOPRESSOR) 25 MG tablet Take 50 mg by mouth every morning.      Marland Kitchen OVER THE COUNTER MEDICATION Apply 2 drops to eye as needed (dry eyes). Renu eye drops      . Polyethylene Glycol 3350 (MIRALAX PO)  Take 1 packet by mouth as needed (constipation).      . Prenatal Vit-Fe Fumarate-FA (PRENATAL MULTIVITAMIN) TABS Take 1 tablet by mouth every evening.       . verapamil (CALAN-SR) 120 MG CR tablet Take 120 mg by mouth 2 (two) times daily.      . [DISCONTINUED] famotidine (PEPCID) 20 MG tablet Take 1 tablet (20 mg total) by mouth daily.  30 tablet  1    Review of Systems  Gastrointestinal: Negative for nausea, vomiting and abdominal pain.  Genitourinary:       Neg for vaginal bleeding, Loss of fluid.  Musculoskeletal:       Mild pedal edema.  Neurological: Positive for headaches.   Physical Exam   Blood pressure 148/78, pulse 88, temperature 98.3 F (36.8 C), temperature source Oral, resp. rate 18, height 5' 1.5" (1.562 m), weight 202 lb 6 oz (91.797 kg), last menstrual period 08/26/2013.  Physical Exam  Constitutional: She is oriented to person, place, and time. She appears well-developed and well-nourished.  HENT:  Head: Normocephalic.  Musculoskeletal: She exhibits edema (mild pedal edema bilaterally).   Neurological: She is alert and oriented to person, place, and time.  Skin: Skin is warm and dry.   Results for orders placed during the hospital encounter of 04/20/14 (from the past 24 hour(s))  URINALYSIS, ROUTINE W REFLEX MICROSCOPIC     Status: Abnormal   Collection Time    04/20/14  4:25 PM      Result Value Ref Range   Color, Urine YELLOW  YELLOW   APPearance CLEAR  CLEAR   Specific Gravity, Urine <1.005 (*) 1.005 - 1.030   pH 6.0  5.0 - 8.0   Glucose, UA NEGATIVE  NEGATIVE mg/dL   Hgb urine dipstick NEGATIVE  NEGATIVE   Bilirubin Urine NEGATIVE  NEGATIVE   Ketones, ur NEGATIVE  NEGATIVE mg/dL   Protein, ur NEGATIVE  NEGATIVE mg/dL   Urobilinogen, UA 0.2  0.0 - 1.0 mg/dL   Nitrite NEGATIVE  NEGATIVE   Leukocytes, UA NEGATIVE  NEGATIVE    NST reactive with baseline 135, moderate variability MAU Course  Procedures  149/69 mmHg 133/70 mmHg  119/58 mmHg  148/78 mmHg   149/79 mmHg  131/60 mmHg  MDM  Reported MSE, BP readings, Lab and NST to Dr. Lynnette Caffey.  Order to discharge patient.  Keep BP reading and follow up with Dr. Helane Rima as scheduled.   Assessment and Plan  P:  Hypertension in third trimester pregnancy--[redacted]w[redacted]d      Reactive NST  P:  Keep log of BP readings      Continue current medications for hypertension       Follow up with Dr. Helane Rima as scheduled for Wednesday Molina Hollenback,EVE M 04/20/2014, 5:49 PM

## 2014-04-20 NOTE — Discharge Instructions (Signed)

## 2014-04-28 ENCOUNTER — Inpatient Hospital Stay (HOSPITAL_COMMUNITY)
Admission: AD | Admit: 2014-04-28 | Discharge: 2014-05-01 | DRG: 781 | Disposition: A | Discharge status: Home / Self Care | Payer: Federal, State, Local not specified - PPO | Source: Ambulatory Visit | Attending: Obstetrics and Gynecology | Admitting: Obstetrics and Gynecology

## 2014-04-28 ENCOUNTER — Inpatient Hospital Stay (HOSPITAL_COMMUNITY): Payer: Federal, State, Local not specified - PPO

## 2014-04-28 ENCOUNTER — Encounter (HOSPITAL_COMMUNITY): Payer: Self-pay | Admitting: *Deleted

## 2014-04-28 DIAGNOSIS — I10 Essential (primary) hypertension: Secondary | ICD-10-CM | POA: Diagnosis present

## 2014-04-28 DIAGNOSIS — E282 Polycystic ovarian syndrome: Secondary | ICD-10-CM | POA: Diagnosis present

## 2014-04-28 DIAGNOSIS — K219 Gastro-esophageal reflux disease without esophagitis: Secondary | ICD-10-CM | POA: Diagnosis present

## 2014-04-28 DIAGNOSIS — O3410 Maternal care for benign tumor of corpus uteri, unspecified trimester: Secondary | ICD-10-CM | POA: Diagnosis present

## 2014-04-28 DIAGNOSIS — O99810 Abnormal glucose complicating pregnancy: Secondary | ICD-10-CM | POA: Diagnosis present

## 2014-04-28 DIAGNOSIS — IMO0002 Reserved for concepts with insufficient information to code with codable children: Principal | ICD-10-CM

## 2014-04-28 DIAGNOSIS — D259 Leiomyoma of uterus, unspecified: Secondary | ICD-10-CM | POA: Diagnosis present

## 2014-04-28 DIAGNOSIS — Z87442 Personal history of urinary calculi: Secondary | ICD-10-CM | POA: Diagnosis not present

## 2014-04-28 DIAGNOSIS — O119 Pre-existing hypertension with pre-eclampsia, unspecified trimester: Secondary | ICD-10-CM | POA: Diagnosis present

## 2014-04-28 DIAGNOSIS — O10019 Pre-existing essential hypertension complicating pregnancy, unspecified trimester: Secondary | ICD-10-CM | POA: Diagnosis present

## 2014-04-28 LAB — COMPREHENSIVE METABOLIC PANEL
ALT: 13 U/L (ref 0–35)
AST: 13 U/L (ref 0–37)
Albumin: 2.9 g/dL — ABNORMAL LOW (ref 3.5–5.2)
Alkaline Phosphatase: 83 U/L (ref 39–117)
Anion gap: 14 (ref 5–15)
BUN: 4 mg/dL — ABNORMAL LOW (ref 6–23)
CHLORIDE: 105 meq/L (ref 96–112)
CO2: 20 meq/L (ref 19–32)
Calcium: 11 mg/dL — ABNORMAL HIGH (ref 8.4–10.5)
Creatinine, Ser: 0.48 mg/dL — ABNORMAL LOW (ref 0.50–1.10)
Glucose, Bld: 82 mg/dL (ref 70–99)
Potassium: 3.1 mEq/L — ABNORMAL LOW (ref 3.7–5.3)
SODIUM: 139 meq/L (ref 137–147)
Total Protein: 6.6 g/dL (ref 6.0–8.3)

## 2014-04-28 LAB — CBC
HCT: 30.9 % — ABNORMAL LOW (ref 36.0–46.0)
HEMOGLOBIN: 10.6 g/dL — AB (ref 12.0–15.0)
MCH: 26.9 pg (ref 26.0–34.0)
MCHC: 34.3 g/dL (ref 30.0–36.0)
MCV: 78.4 fL (ref 78.0–100.0)
Platelets: 199 10*3/uL (ref 150–400)
RBC: 3.94 MIL/uL (ref 3.87–5.11)
RDW: 14.1 % (ref 11.5–15.5)
WBC: 9.9 10*3/uL (ref 4.0–10.5)

## 2014-04-28 LAB — PROTEIN / CREATININE RATIO, URINE
Creatinine, Urine: 33.54 mg/dL
Protein Creatinine Ratio: 0.47 — ABNORMAL HIGH (ref 0.00–0.15)
Total Protein, Urine: 15.6 mg/dL

## 2014-04-28 LAB — URINE MICROSCOPIC-ADD ON

## 2014-04-28 LAB — URINALYSIS, ROUTINE W REFLEX MICROSCOPIC
BILIRUBIN URINE: NEGATIVE
Glucose, UA: NEGATIVE mg/dL
Ketones, ur: 15 mg/dL — AB
Leukocytes, UA: NEGATIVE
NITRITE: NEGATIVE
PH: 7 (ref 5.0–8.0)
Protein, ur: NEGATIVE mg/dL
SPECIFIC GRAVITY, URINE: 1.01 (ref 1.005–1.030)
Urobilinogen, UA: 0.2 mg/dL (ref 0.0–1.0)

## 2014-04-28 MED ORDER — VERAPAMIL HCL ER 120 MG PO TBCR
120.0000 mg | EXTENDED_RELEASE_TABLET | Freq: Once | ORAL | Status: AC
  Administered 2014-04-28: 120 mg via ORAL
  Filled 2014-04-28: qty 1

## 2014-04-28 MED ORDER — ACETAMINOPHEN 325 MG PO TABS
650.0000 mg | ORAL_TABLET | ORAL | Status: DC | PRN
  Administered 2014-04-30: 650 mg via ORAL
  Filled 2014-04-28: qty 2

## 2014-04-28 MED ORDER — METFORMIN HCL ER 500 MG PO TB24
500.0000 mg | ORAL_TABLET | Freq: Every day | ORAL | Status: DC
  Administered 2014-04-29 – 2014-05-01 (×3): 500 mg via ORAL
  Filled 2014-04-28 (×5): qty 1

## 2014-04-28 MED ORDER — METOPROLOL TARTRATE 25 MG PO TABS
25.0000 mg | ORAL_TABLET | Freq: Once | ORAL | Status: AC
  Administered 2014-04-28: 25 mg via ORAL
  Filled 2014-04-28: qty 1

## 2014-04-28 MED ORDER — HYDRALAZINE HCL 20 MG/ML IJ SOLN
10.0000 mg | Freq: Once | INTRAMUSCULAR | Status: AC
  Administered 2014-04-28: 10 mg via INTRAVENOUS
  Filled 2014-04-28: qty 1

## 2014-04-28 MED ORDER — PRENATAL MULTIVITAMIN CH
1.0000 | ORAL_TABLET | Freq: Every day | ORAL | Status: DC
  Filled 2014-04-28 (×2): qty 1

## 2014-04-28 MED ORDER — DOCUSATE SODIUM 100 MG PO CAPS
100.0000 mg | ORAL_CAPSULE | Freq: Every day | ORAL | Status: DC
  Administered 2014-04-29 – 2014-05-01 (×3): 100 mg via ORAL
  Filled 2014-04-28 (×4): qty 1

## 2014-04-28 MED ORDER — GI COCKTAIL ~~LOC~~
30.0000 mL | Freq: Once | ORAL | Status: DC
Start: 2014-04-28 — End: 2014-04-28

## 2014-04-28 MED ORDER — VERAPAMIL HCL ER 180 MG PO TBCR
180.0000 mg | EXTENDED_RELEASE_TABLET | Freq: Two times a day (BID) | ORAL | Status: DC
  Administered 2014-04-29 – 2014-05-01 (×5): 180 mg via ORAL
  Filled 2014-04-28 (×6): qty 1

## 2014-04-28 MED ORDER — ZOLPIDEM TARTRATE 5 MG PO TABS
5.0000 mg | ORAL_TABLET | Freq: Every evening | ORAL | Status: DC | PRN
  Filled 2014-04-28: qty 1

## 2014-04-28 MED ORDER — OXYCODONE-ACETAMINOPHEN 5-325 MG PO TABS
2.0000 | ORAL_TABLET | Freq: Once | ORAL | Status: AC
  Administered 2014-04-28: 2 via ORAL
  Filled 2014-04-28: qty 2

## 2014-04-28 MED ORDER — METOPROLOL TARTRATE 50 MG PO TABS
50.0000 mg | ORAL_TABLET | Freq: Two times a day (BID) | ORAL | Status: DC
  Administered 2014-04-28 – 2014-05-01 (×6): 50 mg via ORAL
  Filled 2014-04-28 (×7): qty 1

## 2014-04-28 MED ORDER — FAMOTIDINE 20 MG PO TABS
40.0000 mg | ORAL_TABLET | Freq: Every day | ORAL | Status: DC
  Administered 2014-04-29 – 2014-05-01 (×3): 40 mg via ORAL
  Filled 2014-04-28 (×4): qty 2

## 2014-04-28 MED ORDER — CALCIUM CARBONATE ANTACID 500 MG PO CHEW
2.0000 | CHEWABLE_TABLET | ORAL | Status: DC | PRN
  Filled 2014-04-28: qty 2

## 2014-04-28 MED ORDER — VERAPAMIL HCL 120 MG PO TABS
120.0000 mg | ORAL_TABLET | Freq: Once | ORAL | Status: DC
Start: 2014-04-28 — End: 2014-04-28

## 2014-04-28 MED ORDER — HYDRALAZINE HCL 20 MG/ML IJ SOLN: 10.0000 mg | INTRAMUSCULAR | Status: DC | PRN

## 2014-04-28 MED ORDER — LACTATED RINGERS IV SOLN
INTRAVENOUS | Status: DC
  Administered 2014-04-28: 18:00:00 via INTRAVENOUS

## 2014-04-28 MED ORDER — OXYCODONE-ACETAMINOPHEN 5-325 MG PO TABS
1.0000 | ORAL_TABLET | ORAL | Status: DC | PRN
  Filled 2014-04-28: qty 1

## 2014-04-28 NOTE — MAU Provider Note (Signed)
Chief Complaint:  Hypertension and Headache   First Provider Initiated Contact with Patient 04/28/2014 at 1616.  HPI: Debbie Walter is a 33 y.o. [phone] at [redacted]w[redacted]d who was sent to maternity admissions from [facility] for elevated BP. Has CHTN on Verapamil and Metoprolol. Reporting. C/O new onset right-sided HA that she rates 8/10 before Tylenol and 7/10 after Tylenol. Has Heartburn at home today. None now.  Mild rare, cramping. Denies visual changes, bleeding or LOF. Good fetal movement.   Pregnancy Course: CHTN, GDM on Glucophage.   Past Medical History: Past Medical History  Diagnosis Date  . Panic attacks   . Hypertension   . Fibroids   . PCOS (polycystic ovarian syndrome)     takes metformin for this  . Kidney stones 5/14  . GERD (gastroesophageal reflux disease)     takes protonix    Past obstetric history: OB History  Gravida Para Term Preterm AB SAB TAB Ectopic Multiple Living  6 1 1  4 3  1  1     # Outcome Date GA Lbr Len/2nd Weight Sex Delivery Anes PTL Lv  6 CUR           5 SAB 2015          4 ECT 2014          3 TRM 12/28/10    F LTCS EPI  Y     Comments: System Generated. Please review and update pregnancy details.  2 SAB 2010          1 SAB 2008              Past Surgical History: Past Surgical History  Procedure Laterality Date  . Parathyroidectomy    . Cesarean section    . Parathyroidectomy    . Hysteroscopy       Family History: Family History  Problem Relation Age of Onset  . Anesthesia problems Neg Hx   . Hypotension Neg Hx   . Malignant hyperthermia Neg Hx   . Pseudochol deficiency Neg Hx     Social History: History  Substance Use Topics  . Smoking status: Never Smoker   . Smokeless tobacco: Never Used  . Alcohol Use: No    Allergies:  Allergies  Allergen Reactions  . Procardia [Nifedipine] Other (See Comments)    headache  . Wellbutrin [Bupropion] Other (See Comments)    "Makes my blood pressure go up - I don't ever  want to take it again"  . Labetalol Other (See Comments)    "makes me feel like things are crawling through my head"  . Zithromax [Azithromycin] Other (See Comments)    Gi upset.  Diarrhea    Meds:  Prescriptions prior to admission  Medication Sig Dispense Refill  . acetaminophen (TYLENOL) 500 MG tablet Take 1,000 mg by mouth every 6 (six) hours as needed for headache.      Marland Kitchen CALCIUM PO Take 1 tablet by mouth daily.      . Cholecalciferol (VITAMIN D PO) Take 1 tablet by mouth daily.      . famotidine (PEPCID) 20 MG tablet Take 40 mg by mouth daily.      Marland Kitchen Fe Cbn-Fe Gluc-FA-B12-C-DSS (FERRALET 90 PO) Take 1 tablet by mouth.      . loratadine (CLARITIN) 10 MG tablet Take 10 mg by mouth daily as needed for allergies.      . metFORMIN (GLUMETZA) 500 MG (MOD) 24 hr tablet Take 500 mg by mouth daily  with breakfast.      . metoprolol tartrate (LOPRESSOR) 25 MG tablet Take 25 mg by mouth daily after supper.       . metoprolol tartrate (LOPRESSOR) 25 MG tablet Take 50 mg by mouth every morning.      Marland Kitchen OVER THE COUNTER MEDICATION Apply 2 drops to eye as needed (dry eyes). Renu eye drops      . Polyethylene Glycol 3350 (MIRALAX PO) Take 1 packet by mouth as needed (constipation).      . Prenatal Vit-Fe Fumarate-FA (PRENATAL MULTIVITAMIN) TABS Take 1 tablet by mouth every evening.       . verapamil (CALAN-SR) 120 MG CR tablet Take 120 mg by mouth 2 (two) times daily.        ROS: Pertinent findings in history of present illness.  Physical Exam  Blood pressure 157/85, pulse 99, temperature 98.6 F (37 C), temperature source Oral, resp. rate 20, last menstrual period 08/26/2013. Patient Vitals for the past 24 hrs:  BP Temp Temp src Pulse Resp  04/28/14 2013 174/90 mmHg - - 97 -  04/28/14 1958 170/81 mmHg - - 89 -  04/28/14 1943 161/77 mmHg - - 90 -  04/28/14 1937 174/71 mmHg - - 90 -  04/28/14 1913 179/80 mmHg - - 98 -  04/28/14 1858 187/98 mmHg - - 101 -  04/28/14 1850 182/87 mmHg - - 92 -   04/28/14 1828 178/99 mmHg - - 96 -  04/28/14 1813 163/82 mmHg - - 106 -  04/28/14 1809 180/92 mmHg - - 111 -  04/28/14 1757 168/89 mmHg - - 96 -  04/28/14 1744 153/80 mmHg - - 91 -  04/28/14 1713 182/101 mmHg - - 99 -  04/28/14 1657 177/95 mmHg - - 96 -  04/28/14 1643 168/88 mmHg - - 94 -  04/28/14 1629 182/92 mmHg - - 95 -  04/28/14 1627 173/94 mmHg - - 88 -  04/28/14 1607 157/85 mmHg 98.6 F (37 C) Oral 99 20    GENERAL: Well-developed, well-nourished female in no acute distress. Anxious. HEENT: normocephalic. ? Facial swelling. HEART: normal rate RESP: normal effort ABDOMEN: Soft, non-tender, gravid appropriate for gestational age EXTREMITIES: Nontender, no edema NEURO: alert and oriented SPECULUM EXAM: Deferred     FHT:  Baseline 135 , moderate variability, 10x10 accelerations present, no decelerations Contractions: None   Labs: Results for orders placed during the hospital encounter of 04/28/14 (from the past 24 hour(s))  URINALYSIS, ROUTINE W REFLEX MICROSCOPIC     Status: Abnormal   Collection Time    04/28/14  4:20 PM      Result Value Ref Range   Color, Urine YELLOW  YELLOW   APPearance CLEAR  CLEAR   Specific Gravity, Urine 1.010  1.005 - 1.030   pH 7.0  5.0 - 8.0   Glucose, UA NEGATIVE  NEGATIVE mg/dL   Hgb urine dipstick SMALL (*) NEGATIVE   Bilirubin Urine NEGATIVE  NEGATIVE   Ketones, ur 15 (*) NEGATIVE mg/dL   Protein, ur NEGATIVE  NEGATIVE mg/dL   Urobilinogen, UA 0.2  0.0 - 1.0 mg/dL   Nitrite NEGATIVE  NEGATIVE   Leukocytes, UA NEGATIVE  NEGATIVE  PROTEIN / CREATININE RATIO, URINE     Status: Abnormal   Collection Time    04/28/14  4:20 PM      Result Value Ref Range   Creatinine, Urine 33.54     Total Protein, Urine 15.6     PROTEIN CREATININE RATIO 0.47 (*)  0.00 - 0.15  URINE MICROSCOPIC-ADD ON     Status: Abnormal   Collection Time    04/28/14  4:20 PM      Result Value Ref Range   Squamous Epithelial / LPF FEW (*) RARE   WBC, UA 0-2  <3  WBC/hpf   RBC / HPF 0-2  <3 RBC/hpf  CBC     Status: Abnormal   Collection Time    04/28/14  6:23 PM      Result Value Ref Range   WBC 9.9  4.0 - 10.5 K/uL   RBC 3.94  3.87 - 5.11 MIL/uL   Hemoglobin 10.6 (*) 12.0 - 15.0 g/dL   HCT 30.9 (*) 36.0 - 46.0 %   MCV 78.4  78.0 - 100.0 fL   MCH 26.9  26.0 - 34.0 pg   MCHC 34.3  30.0 - 36.0 g/dL   RDW 14.1  11.5 - 15.5 %   Platelets 199  150 - 400 K/uL  COMPREHENSIVE METABOLIC PANEL     Status: Abnormal   Collection Time    04/28/14  6:23 PM      Result Value Ref Range   Sodium 139  137 - 147 mEq/L   Potassium 3.1 (*) 3.7 - 5.3 mEq/L   Chloride 105  96 - 112 mEq/L   CO2 20  19 - 32 mEq/L   Glucose, Bld 82  70 - 99 mg/dL   BUN 4 (*) 6 - 23 mg/dL   Creatinine, Ser 0.48 (*) 0.50 - 1.10 mg/dL   Calcium 11.0 (*) 8.4 - 10.5 mg/dL   Total Protein 6.6  6.0 - 8.3 g/dL   Albumin 2.9 (*) 3.5 - 5.2 g/dL   AST 13  0 - 37 U/L   ALT 13  0 - 35 U/L   Alkaline Phosphatase 83  39 - 117 U/L   Total Bilirubin <0.2 (*) 0.3 - 1.2 mg/dL   GFR calc non Af Amer >90  >90 mL/min   GFR calc Af Amer >90  >90 mL/min   Anion gap 14  5 - 15    Imaging:  No results found. MAU Course: CBC, CMP, protein creatinine ratio, peripheral IV, hydralazine IV, Percocet Lopressor and verapamil. Dr. Corinna Capra notified of patient's severe range blood pressures and orders. Will call with results.  Hydralazine repeated. No improvement in blood pressure. Headache resolved with Percocet. Dr. Corinna Capra notified.   Assessment: 1. Chronic hypertension with superimposed preeclampsia    Plan: Will admit for 24-hour urine and blood pressure management per consult with Dr. Corinna Capra.  BPP Repeat PIH labs in the morning  [place], [place] 04/28/2014 9:16 PM  Admitted for BP management and PIH rule out.  Some improvement with 2 doses of IV Hydralizine.  [place] labs normal.  Her current meds for BP have been increased and will use prn Hydralizine to get BPs in better control.  (cant tolerate  Labetalol, so will increase her metoprolol (currently underdosed) and will increase her verapamil) DL

## 2014-04-28 NOTE — MAU Note (Signed)
Pt sent from MD office for elevated BP, hx of chronic HTN.  Pt C/O HA & some epigastric pain.  Denies visual changes.  Has some abd tightening, denies bleeding or LOF.

## 2014-04-29 LAB — CBC
HEMATOCRIT: 28.8 % — AB (ref 36.0–46.0)
HEMOGLOBIN: 10.1 g/dL — AB (ref 12.0–15.0)
MCH: 27.7 pg (ref 26.0–34.0)
MCHC: 35.1 g/dL (ref 30.0–36.0)
MCV: 78.9 fL (ref 78.0–100.0)
Platelets: 199 10*3/uL (ref 150–400)
RBC: 3.65 MIL/uL — ABNORMAL LOW (ref 3.87–5.11)
RDW: 14.3 % (ref 11.5–15.5)
WBC: 9.8 10*3/uL (ref 4.0–10.5)

## 2014-04-29 LAB — COMPREHENSIVE METABOLIC PANEL
ALK PHOS: 75 U/L (ref 39–117)
ALT: 12 U/L (ref 0–35)
AST: 12 U/L (ref 0–37)
Albumin: 2.6 g/dL — ABNORMAL LOW (ref 3.5–5.2)
Anion gap: 12 (ref 5–15)
BUN: 4 mg/dL — ABNORMAL LOW (ref 6–23)
CO2: 21 mEq/L (ref 19–32)
Calcium: 10.4 mg/dL (ref 8.4–10.5)
Chloride: 105 mEq/L (ref 96–112)
Creatinine, Ser: 0.47 mg/dL — ABNORMAL LOW (ref 0.50–1.10)
GLUCOSE: 84 mg/dL (ref 70–99)
POTASSIUM: 3.2 meq/L — AB (ref 3.7–5.3)
SODIUM: 138 meq/L (ref 137–147)
Total Bilirubin: <0.2 mg/dL — ABNORMAL LOW (ref 0.3–1.2)
Total Protein: 5.8 g/dL — ABNORMAL LOW (ref 6.0–8.3)

## 2014-04-29 MED ORDER — SODIUM CHLORIDE 0.9 % IJ SOLN
3.0000 mL | Freq: Two times a day (BID) | INTRAMUSCULAR | Status: DC
  Administered 2014-04-29: 3 mL via INTRAVENOUS

## 2014-04-29 NOTE — Progress Notes (Signed)
Patient ID: Debbie Walter, female   DOB: 1981-05-01, 33 y.o.   MRN: [id number] IUP at 34 weeks with chronic hypertension on lopressor and verapamil admitted with elevated bp's bp 151/77  Max 187/98 C/o headache no scotomata or ruq pain Tracing last pm reactive no decels pih labs normal dtr's 3 + bpp last pm results pending 24 hour urine in progress Will complete urine and follow bp's today on increase meds

## 2014-04-30 LAB — PROTEIN, URINE, 24 HOUR
Collection Interval-UPROT: 24 hours
PROTEIN 24H UR: 230 mg/d — AB (ref 50–100)
PROTEIN, URINE: 10 mg/dL
URINE TOTAL VOLUME-UPROT: 2300 mL

## 2014-04-30 LAB — CREATININE CLEARANCE, URINE, 24 HOUR
CREAT CLEAR: 195 mL/min — AB (ref 75–115)
Collection Interval-CRCL: 24 hours
Creatinine, 24H Ur: 1323 mg/d (ref 700–1800)
Creatinine, Urine: 57.51 mg/dL
Creatinine: 0.47 mg/dL — ABNORMAL LOW (ref 0.50–1.10)
Urine Total Volume-CRCL: 2300 mL

## 2014-04-30 NOTE — Progress Notes (Signed)
Pt c/o HA and mild dizziness.  No n/v or epigastric pain  BP 140-150/90 Gen - NAD Abd - gravid, NT  A/P;  Discussed with pt outpt mngt and OOW with increased rest Needs to make arrangements at work and with 35yo daughter Tylenol for HA Consider d/c in am if symptoms improved and support arranged at home.

## 2014-04-30 NOTE — Progress Notes (Signed)
Pt c/o mild dizziness after increase of BP meds.  No HA, ctx, vb or visual changes.  + FM  BP 140-150s/77-88 Gen - NAD Abd - gravid, NT Ext - NT, DTR 1+  24 hr urine = 230mg  protein  A/P:  CTN, 34 weeks Continue current BP meds with increase dose (verapamil, lopressor) If feeling ok after next dose verapamil, will consider outpt mngt Discussed increased rest, f/u in office 2x/wk, and likely IOL by 37wks

## 2014-05-01 MED ORDER — METOPROLOL TARTRATE 50 MG PO TABS: 50.0000 mg | ORAL_TABLET | Freq: Two times a day (BID) | ORAL | Status: DC

## 2014-05-01 MED ORDER — VERAPAMIL HCL ER 180 MG PO TBCR
180.0000 mg | EXTENDED_RELEASE_TABLET | Freq: Two times a day (BID) | ORAL | Status: DC
Start: 2014-05-01 — End: 2015-01-14

## 2014-05-01 NOTE — Discharge Summary (Signed)
Physician Discharge Summary  Patient ID: Debbie Walter MRN: [id number] DOB/AGE: [birth year] 33 y.o.  Admit date: 04/28/2014 Discharge date: 05/01/2014  Admission Diagnoses: CHTN with elevated BP                                         33 6/7 weeks  Discharge Diagnoses:  Active Problems:   Chronic hypertension with superimposed preeclampsia   Discharged Condition: stable  Hospital Course: admitted with C/O HA and elevated BP. Medication doses increased and now BPs stable and HA resolved. Labs OK.  Consults: None  Significant Diagnostic Studies: labs:  Results for orders placed during the hospital encounter of 04/28/14 (from the past 72 hour(s))  URINALYSIS, ROUTINE W REFLEX MICROSCOPIC     Status: Abnormal   Collection Time    04/28/14  4:20 PM      Result Value Ref Range   Color, Urine YELLOW  YELLOW   APPearance CLEAR  CLEAR   Specific Gravity, Urine 1.010  1.005 - 1.030   pH 7.0  5.0 - 8.0   Glucose, UA NEGATIVE  NEGATIVE mg/dL   Hgb urine dipstick SMALL (*) NEGATIVE   Bilirubin Urine NEGATIVE  NEGATIVE   Ketones, ur 15 (*) NEGATIVE mg/dL   Protein, ur NEGATIVE  NEGATIVE mg/dL   Urobilinogen, UA 0.2  0.0 - 1.0 mg/dL   Nitrite NEGATIVE  NEGATIVE   Leukocytes, UA NEGATIVE  NEGATIVE  PROTEIN / CREATININE RATIO, URINE     Status: Abnormal   Collection Time    04/28/14  4:20 PM      Result Value Ref Range   Creatinine, Urine 33.54     Total Protein, Urine 15.6     Comment: NO NORMAL RANGE ESTABLISHED FOR THIS TEST   PROTEIN CREATININE RATIO 0.47 (*) 0.00 - 0.15  URINE MICROSCOPIC-ADD ON     Status: Abnormal   Collection Time    04/28/14  4:20 PM      Result Value Ref Range   Squamous Epithelial / LPF FEW (*) RARE   WBC, UA 0-2  <3 WBC/hpf   RBC / HPF 0-2  <3 RBC/hpf  CBC     Status: Abnormal   Collection Time    04/28/14  6:23 PM      Result Value Ref Range   WBC 9.9  4.0 - 10.5 K/uL   RBC 3.94  3.87 - 5.11 MIL/uL   Hemoglobin 10.6 (*) 12.0 - 15.0 g/dL   HCT  30.9 (*) 36.0 - 46.0 %   MCV 78.4  78.0 - 100.0 fL   MCH 26.9  26.0 - 34.0 pg   MCHC 34.3  30.0 - 36.0 g/dL   RDW 14.1  11.5 - 15.5 %   Platelets 199  150 - 400 K/uL  COMPREHENSIVE METABOLIC PANEL     Status: Abnormal   Collection Time    04/28/14  6:23 PM      Result Value Ref Range   Sodium 139  137 - 147 mEq/L   Potassium 3.1 (*) 3.7 - 5.3 mEq/L   Chloride 105  96 - 112 mEq/L   CO2 20  19 - 32 mEq/L   Glucose, Bld 82  70 - 99 mg/dL   BUN 4 (*) 6 - 23 mg/dL   Creatinine, Ser 0.48 (*) 0.50 - 1.10 mg/dL   Calcium 11.0 (*) 8.4 - 10.5 mg/dL  Total Protein 6.6  6.0 - 8.3 g/dL   Albumin 2.9 (*) 3.5 - 5.2 g/dL   AST 13  0 - 37 U/L   ALT 13  0 - 35 U/L   Alkaline Phosphatase 83  39 - 117 U/L   Total Bilirubin <0.2 (*) 0.3 - 1.2 mg/dL   GFR calc non Af Amer >90  >90 mL/min   GFR calc Af Amer >90  >90 mL/min   Comment: (NOTE)     The eGFR has been calculated using the CKD EPI equation.     This calculation has not been validated in all clinical situations.     eGFR's persistently <90 mL/min signify possible Chronic Kidney     Disease.   Anion gap 14  5 - 15  CREATININE CLEARANCE, URINE, 24 HOUR     Status: Abnormal   Collection Time    04/28/14 11:40 PM      Result Value Ref Range   Urine Total Volume-CRCL 2300     Collection Interval-CRCL 24     Creatinine, Urine 57.51     Creatinine 0.47 (*) 0.50 - 1.10 mg/dL   Creatinine, 24H Ur 1323  700 - 1800 mg/day   Creatinine Clearance 195 (*) 75 - 115 mL/min   Comment: Performed at [place], URINE, 24 HOUR     Status: Abnormal   Collection Time    04/28/14 11:40 PM      Result Value Ref Range   Urine Total Volume-UPROT 2300     Collection Interval-UPROT 24     Protein, Urine 10     Protein, 24H Urine 230 (*) 50 - 100 mg/day   Comment: Performed at [facility]  CBC     Status: Abnormal   Collection Time    04/29/14  5:13 AM      Result Value Ref Range   WBC 9.8  4.0 - 10.5 K/uL   RBC 3.65 (*)  3.87 - 5.11 MIL/uL   Hemoglobin 10.1 (*) 12.0 - 15.0 g/dL   HCT 28.8 (*) 36.0 - 46.0 %   MCV 78.9  78.0 - 100.0 fL   MCH 27.7  26.0 - 34.0 pg   MCHC 35.1  30.0 - 36.0 g/dL   RDW 14.3  11.5 - 15.5 %   Platelets 199  150 - 400 K/uL  COMPREHENSIVE METABOLIC PANEL     Status: Abnormal   Collection Time    04/29/14  5:13 AM      Result Value Ref Range   Sodium 138  137 - 147 mEq/L   Potassium 3.2 (*) 3.7 - 5.3 mEq/L   Chloride 105  96 - 112 mEq/L   CO2 21  19 - 32 mEq/L   Glucose, Bld 84  70 - 99 mg/dL   BUN 4 (*) 6 - 23 mg/dL   Creatinine, Ser 0.47 (*) 0.50 - 1.10 mg/dL   Calcium 10.4  8.4 - 10.5 mg/dL   Total Protein 5.8 (*) 6.0 - 8.3 g/dL   Albumin 2.6 (*) 3.5 - 5.2 g/dL   AST 12  0 - 37 U/L   ALT 12  0 - 35 U/L   Alkaline Phosphatase 75  39 - 117 U/L   Total Bilirubin <0.2 (*) 0.3 - 1.2 mg/dL   GFR calc non Af Amer >90  >90 mL/min   GFR calc Af Amer >90  >90 mL/min   Comment: (NOTE)     The eGFR has been  calculated using the CKD EPI equation.     This calculation has not been validated in all clinical situations.     eGFR's persistently <90 mL/min signify possible Chronic Kidney     Disease.   Anion gap 12  5 - 15    Treatments: cardiac meds: metoprolol and verapamil  Discharge Exam: Blood pressure 150/77, pulse 90, temperature 98.5 F (36.9 C), temperature source Oral, resp. rate 18, weight 90.266 kg (199 lb), last menstrual period 08/26/2013. General appearance: alert, cooperative and no distress  Disposition: 01-Home or Self Care     Medication List    STOP taking these medications       CALCIUM PO     famotidine 20 MG tablet  Commonly known as:  PEPCID     loratadine 10 MG tablet  Commonly known as:  CLARITIN     polyethylene glycol packet  Commonly known as:  MIRALAX / GLYCOLAX     prenatal multivitamin Tabs tablet     VITAMIN D PO      TAKE these medications       acetaminophen 500 MG tablet  Commonly known as:  TYLENOL  Take 1,000 mg by  mouth every 6 (six) hours as needed for headache.     FERRALET 90 PO  Take 1 tablet by mouth.     metFORMIN 500 MG (MOD) 24 hr tablet  Commonly known as:  GLUMETZA  Take 500 mg by mouth daily with breakfast.     metoprolol 50 MG tablet  Commonly known as:  LOPRESSOR  Take 1 tablet (50 mg total) by mouth 2 (two) times daily.     verapamil 180 MG CR tablet  Commonly known as:  CALAN-SR  Take 1 tablet (180 mg total) by mouth 2 (two) times daily.         Signed: Rayleigh Gillyard II,Liyat Faulkenberry E 05/01/2014, 8:51 AM

## 2014-05-01 NOTE — Discharge Instructions (Signed)
Preeclampsia and Eclampsia °Preeclampsia is a serious condition that develops only during pregnancy. It is also called toxemia of pregnancy. This condition causes high blood pressure along with other symptoms, such as swelling and headaches. These may develop as the condition gets worse. Preeclampsia may occur 20 weeks or later into your pregnancy.  °Diagnosing and treating preeclampsia early is very important. If not treated early, it can cause serious problems for you and your baby. One problem it can lead to is eclampsia, which is a condition that causes muscle jerking or shaking (convulsions) in the mother. Delivering your baby is the best treatment for preeclampsia or eclampsia.  °RISK FACTORS °The cause of preeclampsia is not known. You may be more likely to develop preeclampsia if you have certain risk factors. These include:  °· Being pregnant for the first time. °· Having preeclampsia in a past pregnancy. °· Having a family history of preeclampsia. °· Having high blood pressure. °· Being pregnant with twins or triplets. °· Being 35 or older. °· Being African American. °· Having kidney disease or diabetes. °· Having medical conditions such as lupus or blood diseases. °· Being very overweight (obese). °SIGNS AND SYMPTOMS  °The earliest signs of preeclampsia are: °· High blood pressure. °· Increased protein in your urine. Your health care provider will check for this at every prenatal visit. °Other symptoms that can develop include:  °· Severe headaches. °· Sudden weight gain. °· Swelling of your hands, face, legs, and feet. °· Feeling sick to your stomach (nauseous) and throwing up (vomiting). °· Vision problems (blurred or double vision). °· Numbness in your face, arms, legs, and feet. °· Dizziness. °· Slurred speech. °· Sensitivity to bright lights. °· Abdominal pain. °DIAGNOSIS  °There are no screening tests for preeclampsia. Your health care provider will ask you about symptoms and check for signs of  preeclampsia during your prenatal visits. You may also have tests, including: °· Urine testing. °· Blood testing. °· Checking your baby's heart rate. °· Checking the health of your baby and your placenta using images created with sound waves (ultrasound). °TREATMENT  °You can work out the best treatment approach together with your health care provider. It is very important to keep all prenatal appointments. If you have an increased risk of preeclampsia, you may need more frequent prenatal exams. °· Your health care provider may prescribe bed rest. °· You may have to eat as little salt as possible. °· You may need to take medicine to lower your blood pressure if the condition does not respond to more conservative measures. °· You may need to stay in the hospital if your condition is severe. There, treatment will focus on controlling your blood pressure and fluid retention. You may also need to take medicine to prevent seizures. °· If the condition gets worse, your baby may need to be delivered early to protect you and the baby. You may have your labor started with medicine (be induced), or you may have a cesarean delivery. °· Preeclampsia usually goes away after the baby is born. °HOME CARE INSTRUCTIONS  °· Only take over-the-counter or prescription medicines as directed by your health care provider. °· Lie on your left side while resting. This keeps pressure off your baby. °· Elevate your feet while resting. °· Get regular exercise. Ask your health care provider what type of exercise is safe for you. °· Avoid caffeine and alcohol. °· Do not smoke. °· Drink 6-8 glasses of water every day. °· Eat a balanced diet   that is low in salt. Do not add salt to your food.  Avoid stressful situations as much as possible.  Get plenty of rest and sleep.  Keep all prenatal appointments and tests as scheduled. SEEK MEDICAL CARE IF:  You are gaining more weight than expected.  You have any headaches, abdominal pain, or  nausea.  You are bruising more than usual.  You feel dizzy or light-headed. SEEK IMMEDIATE MEDICAL CARE IF:   You develop sudden or severe swelling anywhere in your body. This usually happens in the legs.  You gain 5 lb (2.3 kg) or more in a week.  You have a severe headache, dizziness, problems with your vision, or confusion.  You have severe abdominal pain.  You have lasting nausea or vomiting.  You have a seizure.  You have trouble moving any part of your body.  You develop numbness in your body.  You have trouble speaking.  You have any abnormal bleeding.  You develop a stiff neck.  You pass out. MAKE SURE YOU:   Understand these instructions.  Will watch your condition.  Will get help right away if you are not doing well or get worse. Document Released: 09/01/2000 Document Revised: 09/09/2013 Document Reviewed: 06/27/2013 [facility] Patient Information 2015 [place], [place]. This information is not intended to replace advice given to you by your health care provider. Make sure you discuss any questions you have with your health care provider.    Hypertension During Pregnancy Hypertension is also called high blood pressure. Blood pressure moves blood in your body. Sometimes, the force that moves the blood becomes too strong. When you are pregnant, this condition should be watched carefully. It can cause problems for you and your baby. HOME CARE   Make and keep all of your doctor visits.  Take medicine as told by your doctor. Tell your doctor about all medicines you take.  Eat very little salt.  Exercise regularly.  Do not drink alcohol.  Do not smoke.  Do not have drinks with caffeine.  Lie on your left side when resting.  Your health care provider may ask you to take one low-dose aspirin (81mg ) each day. GET HELP RIGHT AWAY IF:  You have bad belly (abdominal) pain.  You have sudden puffiness (swelling) in the hands, ankles, or face.  You gain 4  pounds (1.8 kilograms) or more in 1 week.  You throw up (vomit) repeatedly.  You have bleeding from the vagina.  You do not feel the baby moving as much.  You have a headache.  You have blurred or double vision.  You have muscle twitching or spasms.  You have shortness of breath.  You have blue fingernails and lips.  You have blood in your pee (urine). MAKE SURE YOU:  Understand these instructions.  Will watch your condition.  Will get help right away if you are not doing well or get worse. Document Released: 10/07/2010 Document Revised: 01/19/2014 Document Reviewed: 04/03/2013 [facility] Patient Information 2015 [place], [place]. This information is not intended to replace advice given to you by your health care provider. Make sure you discuss any questions you have with your health care provider.

## 2014-05-01 NOTE — Progress Notes (Signed)
Feels better, ready to go home  BPs 140-150s/70-80s  NST reactive last pm  A: CHTN with better control  P: D/C home with current dosages of verapamil and lopressor     FU office 3-4 days for NST twice/week     D/W patient modified BR and sxs of preeclampsia

## 2014-05-01 NOTE — Progress Notes (Signed)
Discharge instructions reviewed with patient. Verbalized an understanding of instructions.

## 2014-05-15 ENCOUNTER — Encounter (HOSPITAL_COMMUNITY): Payer: Self-pay | Admitting: Anesthesiology

## 2014-05-15 ENCOUNTER — Encounter (HOSPITAL_COMMUNITY)
Admission: AD | Disposition: A | Discharge status: Home / Self Care | Payer: Self-pay | Source: Ambulatory Visit | Attending: Obstetrics and Gynecology

## 2014-05-15 ENCOUNTER — Encounter (HOSPITAL_COMMUNITY): Payer: Federal, State, Local not specified - PPO | Admitting: Anesthesiology

## 2014-05-15 ENCOUNTER — Inpatient Hospital Stay: Admit: 2014-05-15 | Payer: Self-pay | Admitting: Obstetrics and Gynecology

## 2014-05-15 ENCOUNTER — Inpatient Hospital Stay (HOSPITAL_COMMUNITY): Payer: Federal, State, Local not specified - PPO | Admitting: Anesthesiology

## 2014-05-15 ENCOUNTER — Inpatient Hospital Stay (HOSPITAL_COMMUNITY)
Admission: AD | Admit: 2014-05-15 | Discharge: 2014-05-17 | DRG: 765 | Disposition: A | Discharge status: Home / Self Care | Payer: Federal, State, Local not specified - PPO | Source: Ambulatory Visit | Attending: Obstetrics and Gynecology | Admitting: Obstetrics and Gynecology

## 2014-05-15 DIAGNOSIS — K219 Gastro-esophageal reflux disease without esophagitis: Secondary | ICD-10-CM | POA: Diagnosis present

## 2014-05-15 DIAGNOSIS — E669 Obesity, unspecified: Secondary | ICD-10-CM | POA: Diagnosis present

## 2014-05-15 DIAGNOSIS — O34219 Maternal care for unspecified type scar from previous cesarean delivery: Principal | ICD-10-CM | POA: Diagnosis present

## 2014-05-15 DIAGNOSIS — O119 Pre-existing hypertension with pre-eclampsia, unspecified trimester: Secondary | ICD-10-CM

## 2014-05-15 DIAGNOSIS — Z302 Encounter for sterilization: Secondary | ICD-10-CM | POA: Diagnosis not present

## 2014-05-15 DIAGNOSIS — Z6837 Body mass index (BMI) 37.0-37.9, adult: Secondary | ICD-10-CM

## 2014-05-15 DIAGNOSIS — Z98891 History of uterine scar from previous surgery: Secondary | ICD-10-CM

## 2014-05-15 DIAGNOSIS — O09529 Supervision of elderly multigravida, unspecified trimester: Secondary | ICD-10-CM | POA: Diagnosis present

## 2014-05-15 DIAGNOSIS — Z87442 Personal history of urinary calculi: Secondary | ICD-10-CM | POA: Diagnosis not present

## 2014-05-15 DIAGNOSIS — O1002 Pre-existing essential hypertension complicating childbirth: Secondary | ICD-10-CM | POA: Diagnosis present

## 2014-05-15 DIAGNOSIS — N201 Calculus of ureter: Secondary | ICD-10-CM

## 2014-05-15 DIAGNOSIS — O99214 Obesity complicating childbirth: Secondary | ICD-10-CM

## 2014-05-15 LAB — ABO/RH: ABO/RH(D): A POS

## 2014-05-15 LAB — CBC
HCT: 33.2 % — ABNORMAL LOW (ref 36.0–46.0)
Hemoglobin: 11.5 g/dL — ABNORMAL LOW (ref 12.0–15.0)
MCH: 27.3 pg (ref 26.0–34.0)
MCHC: 34.6 g/dL (ref 30.0–36.0)
MCV: 78.7 fL (ref 78.0–100.0)
Platelets: 203 10*3/uL (ref 150–400)
RBC: 4.22 MIL/uL (ref 3.87–5.11)
RDW: 14.4 % (ref 11.5–15.5)
WBC: 9.3 10*3/uL (ref 4.0–10.5)

## 2014-05-15 LAB — TYPE AND SCREEN
ABO/RH(D): A POS
Antibody Screen: NEGATIVE

## 2014-05-15 LAB — OB RESULTS CONSOLE RUBELLA ANTIBODY, IGM: RUBELLA: IMMUNE

## 2014-05-15 LAB — RPR

## 2014-05-15 SURGERY — Surgical Case
Anesthesia: Regional

## 2014-05-15 SURGERY — Surgical Case
Anesthesia: Epidural | Laterality: Bilateral

## 2014-05-15 MED ORDER — MEPERIDINE HCL 25 MG/ML IJ SOLN
INTRAMUSCULAR | Status: AC
  Filled 2014-05-15: qty 1

## 2014-05-15 MED ORDER — LACTATED RINGERS IV SOLN: INTRAVENOUS | Status: DC

## 2014-05-15 MED ORDER — KETOROLAC TROMETHAMINE 30 MG/ML IJ SOLN
30.0000 mg | Freq: Four times a day (QID) | INTRAMUSCULAR | Status: AC | PRN
  Administered 2014-05-15: 30 mg via INTRAMUSCULAR

## 2014-05-15 MED ORDER — ONDANSETRON HCL 4 MG PO TABS: 4.0000 mg | ORAL_TABLET | ORAL | Status: DC | PRN

## 2014-05-15 MED ORDER — MORPHINE SULFATE 0.5 MG/ML IJ SOLN
INTRAMUSCULAR | Status: AC
  Filled 2014-05-15: qty 10

## 2014-05-15 MED ORDER — CEFAZOLIN SODIUM-DEXTROSE 2-3 GM-% IV SOLR
2.0000 g | INTRAVENOUS | Status: AC
  Administered 2014-05-15: 2 g via INTRAVENOUS
  Filled 2014-05-15: qty 50

## 2014-05-15 MED ORDER — MEPERIDINE HCL 25 MG/ML IJ SOLN
INTRAMUSCULAR | Status: DC | PRN
  Administered 2014-05-15 (×2): 12.5 mg via INTRAVENOUS

## 2014-05-15 MED ORDER — KETOROLAC TROMETHAMINE 30 MG/ML IJ SOLN: 30.0000 mg | Freq: Four times a day (QID) | INTRAMUSCULAR | Status: AC | PRN

## 2014-05-15 MED ORDER — SODIUM CHLORIDE 0.9 % IR SOLN
Status: DC | PRN
  Administered 2014-05-15: 1000 mL

## 2014-05-15 MED ORDER — LANOLIN HYDROUS EX OINT
1.0000 | TOPICAL_OINTMENT | CUTANEOUS | Status: DC | PRN
Start: 2014-05-15 — End: 2014-05-17

## 2014-05-15 MED ORDER — BUPIVACAINE IN DEXTROSE 0.75-8.25 % IT SOLN
INTRATHECAL | Status: AC
  Filled 2014-05-15: qty 2

## 2014-05-15 MED ORDER — DIPHENHYDRAMINE HCL 25 MG PO CAPS
25.0000 mg | ORAL_CAPSULE | ORAL | Status: DC | PRN
Start: 2014-05-15 — End: 2014-05-17
  Administered 2014-05-16: 25 mg via ORAL
  Filled 2014-05-15 (×5): qty 1

## 2014-05-15 MED ORDER — CEFAZOLIN SODIUM-DEXTROSE 2-3 GM-% IV SOLR
2.0000 g | INTRAVENOUS | Status: AC
  Filled 2014-05-15: qty 50

## 2014-05-15 MED ORDER — FAMOTIDINE IN NACL 20-0.9 MG/50ML-% IV SOLN
20.0000 mg | Freq: Once | INTRAVENOUS | Status: AC
  Administered 2014-05-15: 20 mg via INTRAVENOUS
  Filled 2014-05-15: qty 50

## 2014-05-15 MED ORDER — NALBUPHINE HCL 10 MG/ML IJ SOLN: 5.0000 mg | INTRAMUSCULAR | Status: DC | PRN

## 2014-05-15 MED ORDER — SCOPOLAMINE 1 MG/3DAYS TD PT72
1.0000 | MEDICATED_PATCH | Freq: Once | TRANSDERMAL | Status: DC
  Administered 2014-05-15: 1.5 mg via TRANSDERMAL

## 2014-05-15 MED ORDER — OXYCODONE-ACETAMINOPHEN 5-325 MG PO TABS
1.0000 | ORAL_TABLET | ORAL | Status: DC | PRN
  Administered 2014-05-16 – 2014-05-17 (×7): 1 via ORAL
  Filled 2014-05-15 (×7): qty 1

## 2014-05-15 MED ORDER — DIPHENHYDRAMINE HCL 50 MG/ML IJ SOLN
INTRAMUSCULAR | Status: AC
  Filled 2014-05-15: qty 1

## 2014-05-15 MED ORDER — DIBUCAINE 1 % RE OINT
1.0000 | TOPICAL_OINTMENT | RECTAL | Status: DC | PRN
Start: 2014-05-15 — End: 2014-05-17

## 2014-05-15 MED ORDER — LACTATED RINGERS IV SOLN
INTRAVENOUS | Status: DC
  Administered 2014-05-15: 22:00:00 via INTRAVENOUS

## 2014-05-15 MED ORDER — VERAPAMIL HCL ER 180 MG PO TBCR
180.0000 mg | EXTENDED_RELEASE_TABLET | Freq: Two times a day (BID) | ORAL | Status: DC
  Administered 2014-05-15 – 2014-05-17 (×4): 180 mg via ORAL
  Filled 2014-05-15 (×4): qty 1

## 2014-05-15 MED ORDER — PHENYLEPHRINE HCL 10 MG/ML IJ SOLN
INTRAMUSCULAR | Status: AC
  Filled 2014-05-15: qty 1

## 2014-05-15 MED ORDER — METOCLOPRAMIDE HCL 5 MG/ML IJ SOLN
10.0000 mg | Freq: Three times a day (TID) | INTRAMUSCULAR | Status: DC | PRN
Start: 2014-05-15 — End: 2014-05-17

## 2014-05-15 MED ORDER — NALOXONE HCL 0.4 MG/ML IJ SOLN: 0.4000 mg | INTRAMUSCULAR | Status: DC | PRN

## 2014-05-15 MED ORDER — TETANUS-DIPHTH-ACELL PERTUSSIS 5-2.5-18.5 LF-MCG/0.5 IM SUSP: 0.5000 mL | Freq: Once | INTRAMUSCULAR | Status: DC

## 2014-05-15 MED ORDER — DIPHENHYDRAMINE HCL 25 MG PO CAPS
25.0000 mg | ORAL_CAPSULE | Freq: Four times a day (QID) | ORAL | Status: DC | PRN
  Administered 2014-05-15 – 2014-05-16 (×2): 25 mg via ORAL

## 2014-05-15 MED ORDER — LACTATED RINGERS IV SOLN
INTRAVENOUS | Status: DC
  Administered 2014-05-15 (×2): via INTRAVENOUS

## 2014-05-15 MED ORDER — SODIUM CHLORIDE 0.9 % IJ SOLN
3.0000 mL | INTRAMUSCULAR | Status: DC | PRN
Start: 2014-05-15 — End: 2014-05-17

## 2014-05-15 MED ORDER — SCOPOLAMINE 1 MG/3DAYS TD PT72
MEDICATED_PATCH | TRANSDERMAL | Status: AC
  Filled 2014-05-15: qty 1

## 2014-05-15 MED ORDER — MEPERIDINE HCL 25 MG/ML IJ SOLN: 6.2500 mg | INTRAMUSCULAR | Status: DC | PRN

## 2014-05-15 MED ORDER — ONDANSETRON HCL 4 MG/2ML IJ SOLN
4.0000 mg | Freq: Three times a day (TID) | INTRAMUSCULAR | Status: DC | PRN
Start: 2014-05-15 — End: 2014-05-17

## 2014-05-15 MED ORDER — OXYTOCIN 10 UNIT/ML IJ SOLN
INTRAMUSCULAR | Status: AC
  Filled 2014-05-15: qty 4

## 2014-05-15 MED ORDER — DIPHENHYDRAMINE HCL 50 MG/ML IJ SOLN: 25.0000 mg | INTRAMUSCULAR | Status: DC | PRN

## 2014-05-15 MED ORDER — SIMETHICONE 80 MG PO CHEW: 80.0000 mg | CHEWABLE_TABLET | ORAL | Status: DC | PRN

## 2014-05-15 MED ORDER — OXYTOCIN 40 UNITS IN LACTATED RINGERS INFUSION - SIMPLE MED: 62.5000 mL/h | INTRAVENOUS | Status: AC

## 2014-05-15 MED ORDER — KETOROLAC TROMETHAMINE 30 MG/ML IJ SOLN
INTRAMUSCULAR | Status: AC
  Administered 2014-05-15: 30 mg via INTRAMUSCULAR
  Filled 2014-05-15: qty 1

## 2014-05-15 MED ORDER — ZOLPIDEM TARTRATE 5 MG PO TABS
5.0000 mg | ORAL_TABLET | Freq: Every evening | ORAL | Status: DC | PRN
Start: 2014-05-15 — End: 2014-05-17

## 2014-05-15 MED ORDER — LACTATED RINGERS IV BOLUS (SEPSIS)
1000.0000 mL | Freq: Once | INTRAVENOUS | Status: AC
  Administered 2014-05-15: 1000 mL via INTRAVENOUS

## 2014-05-15 MED ORDER — DIPHENHYDRAMINE HCL 50 MG/ML IJ SOLN
12.5000 mg | INTRAMUSCULAR | Status: DC | PRN
  Administered 2014-05-15: 12.5 mg via INTRAVENOUS

## 2014-05-15 MED ORDER — METOPROLOL TARTRATE 50 MG PO TABS
50.0000 mg | ORAL_TABLET | Freq: Two times a day (BID) | ORAL | Status: DC
  Administered 2014-05-15 – 2014-05-17 (×4): 50 mg via ORAL
  Filled 2014-05-15 (×4): qty 1

## 2014-05-15 MED ORDER — ONDANSETRON HCL 4 MG/2ML IJ SOLN: 4.0000 mg | INTRAMUSCULAR | Status: DC | PRN

## 2014-05-15 MED ORDER — WITCH HAZEL-GLYCERIN EX PADS: 1.0000 "application " | MEDICATED_PAD | CUTANEOUS | Status: DC | PRN

## 2014-05-15 MED ORDER — ONDANSETRON HCL 4 MG/2ML IJ SOLN
INTRAMUSCULAR | Status: DC | PRN
  Administered 2014-05-15: 4 mg via INTRAVENOUS

## 2014-05-15 MED ORDER — SIMETHICONE 80 MG PO CHEW
80.0000 mg | CHEWABLE_TABLET | ORAL | Status: DC
  Administered 2014-05-16 – 2014-05-17 (×2): 80 mg via ORAL
  Filled 2014-05-15 (×2): qty 1

## 2014-05-15 MED ORDER — MENTHOL 3 MG MT LOZG
1.0000 | LOZENGE | OROMUCOSAL | Status: DC | PRN
Start: 2014-05-15 — End: 2014-05-17

## 2014-05-15 MED ORDER — FENTANYL CITRATE 0.05 MG/ML IJ SOLN
INTRAMUSCULAR | Status: DC | PRN
Start: 2014-05-15 — End: 2014-05-15
  Administered 2014-05-15: 25 ug via INTRATHECAL

## 2014-05-15 MED ORDER — FENTANYL CITRATE 0.05 MG/ML IJ SOLN
INTRAMUSCULAR | Status: AC
  Filled 2014-05-15: qty 2

## 2014-05-15 MED ORDER — HYDROMORPHONE HCL PF 1 MG/ML IJ SOLN: 0.2500 mg | INTRAMUSCULAR | Status: DC | PRN

## 2014-05-15 MED ORDER — LACTATED RINGERS IV SOLN
INTRAVENOUS | Status: DC | PRN
  Administered 2014-05-15: 14:00:00 via INTRAVENOUS

## 2014-05-15 MED ORDER — CITRIC ACID-SODIUM CITRATE 334-500 MG/5ML PO SOLN
30.0000 mL | Freq: Once | ORAL | Status: AC
  Administered 2014-05-15: 30 mL via ORAL
  Filled 2014-05-15: qty 15

## 2014-05-15 MED ORDER — PRENATAL MULTIVITAMIN CH
1.0000 | ORAL_TABLET | Freq: Every day | ORAL | Status: DC
  Filled 2014-05-15 (×2): qty 1

## 2014-05-15 MED ORDER — ONDANSETRON HCL 4 MG/2ML IJ SOLN
INTRAMUSCULAR | Status: AC
  Filled 2014-05-15: qty 2

## 2014-05-15 MED ORDER — MORPHINE SULFATE (PF) 0.5 MG/ML IJ SOLN
INTRAMUSCULAR | Status: DC | PRN
  Administered 2014-05-15: .1 mg via EPIDURAL

## 2014-05-15 MED ORDER — NALOXONE HCL 1 MG/ML IJ SOLN
1.0000 ug/kg/h | INTRAVENOUS | Status: DC | PRN
  Filled 2014-05-15: qty 2

## 2014-05-15 MED ORDER — SENNOSIDES-DOCUSATE SODIUM 8.6-50 MG PO TABS
2.0000 | ORAL_TABLET | ORAL | Status: DC
  Administered 2014-05-16 – 2014-05-17 (×2): 2 via ORAL
  Filled 2014-05-15: qty 1
  Filled 2014-05-15 (×2): qty 2

## 2014-05-15 MED ORDER — OXYTOCIN 10 UNIT/ML IJ SOLN
40.0000 [IU] | INTRAVENOUS | Status: DC | PRN
  Administered 2014-05-15: 40 [IU] via INTRAVENOUS

## 2014-05-15 MED ORDER — IBUPROFEN 600 MG PO TABS
600.0000 mg | ORAL_TABLET | Freq: Four times a day (QID) | ORAL | Status: DC
  Administered 2014-05-16 – 2014-05-17 (×7): 600 mg via ORAL
  Filled 2014-05-15 (×7): qty 1

## 2014-05-15 MED ORDER — SIMETHICONE 80 MG PO CHEW
80.0000 mg | CHEWABLE_TABLET | Freq: Three times a day (TID) | ORAL | Status: DC
  Administered 2014-05-16 – 2014-05-17 (×4): 80 mg via ORAL
  Filled 2014-05-15 (×6): qty 1

## 2014-05-15 SURGICAL SUPPLY — 33 items
BARRIER ADHS 3X4 INTERCEED (GAUZE/BANDAGES/DRESSINGS) IMPLANT
BLADE SURG 10 STRL SS (BLADE) IMPLANT
BRR ADH 4X3 ABS CNTRL BYND (GAUZE/BANDAGES/DRESSINGS)
CLAMP CORD UMBIL (MISCELLANEOUS) IMPLANT
CLOTH BEACON ORANGE TIMEOUT ST (SAFETY) ×1 IMPLANT
CONTAINER PREFILL 10% NBF 15ML (MISCELLANEOUS) IMPLANT
DRAPE LG THREE QUARTER DISP (DRAPES) IMPLANT
DRSG OPSITE POSTOP 4X10 (GAUZE/BANDAGES/DRESSINGS) ×1 IMPLANT
DURAPREP 26ML APPLICATOR (WOUND CARE) ×1 IMPLANT
ELECT REM PT RETURN 9FT ADLT (ELECTROSURGICAL)
ELECTRODE REM PT RTRN 9FT ADLT (ELECTROSURGICAL) ×1 IMPLANT
EXTRACTOR VACUUM M CUP 4 TUBE (SUCTIONS) IMPLANT
GLOVE BIO SURGEON STRL SZ 6.5 (GLOVE) IMPLANT
GOWN STRL REUS W/TWL LRG LVL3 (GOWN DISPOSABLE) ×2 IMPLANT
KIT ABG SYR 3ML LUER SLIP (SYRINGE) IMPLANT
NDL HYPO 25X5/8 SAFETYGLIDE (NEEDLE) ×1 IMPLANT
NEEDLE HYPO 22GX1.5 SAFETY (NEEDLE) IMPLANT
NEEDLE HYPO 25X5/8 SAFETYGLIDE (NEEDLE) IMPLANT
NS IRRIG 1000ML POUR BTL (IV SOLUTION) ×1 IMPLANT
PACK C SECTION WH (CUSTOM PROCEDURE TRAY) IMPLANT
PAD OB MATERNITY 4.3X12.25 (PERSONAL CARE ITEMS) ×1 IMPLANT
STAPLER VISISTAT 35W (STAPLE) IMPLANT
SUT CHROMIC 0 CTX 36 (SUTURE) ×2 IMPLANT
SUT PLAIN 0 NONE (SUTURE) IMPLANT
SUT PLAIN 2 0 XLH (SUTURE) IMPLANT
SUT VIC AB 0 CT1 27 (SUTURE)
SUT VIC AB 0 CT1 27XBRD ANBCTR (SUTURE) ×3 IMPLANT
SUT VIC AB 4-0 KS 27 (SUTURE) IMPLANT
SYRINGE CONTROL L 12CC (SYRINGE) IMPLANT
SYRINGE CONTROL LL 12CC (SYRINGE) IMPLANT
TOWEL OR 17X24 6PK STRL BLUE (TOWEL DISPOSABLE) ×1 IMPLANT
TRAY FOLEY CATH 14FR (SET/KITS/TRAYS/PACK) ×1 IMPLANT
WATER STERILE IRR 1000ML POUR (IV SOLUTION) ×1 IMPLANT

## 2014-05-15 SURGICAL SUPPLY — 37 items
APL SKNCLS STERI-STRIP NONHPOA (GAUZE/BANDAGES/DRESSINGS) ×1
BARRIER ADHS 3X4 INTERCEED (GAUZE/BANDAGES/DRESSINGS) IMPLANT
BENZOIN TINCTURE PRP APPL 2/3 (GAUZE/BANDAGES/DRESSINGS) ×2 IMPLANT
BLADE SURG 10 STRL SS (BLADE) ×4 IMPLANT
BRR ADH 4X3 ABS CNTRL BYND (GAUZE/BANDAGES/DRESSINGS)
CLAMP CORD UMBIL (MISCELLANEOUS) IMPLANT
CLIP FILSHIE TUBAL LIGA STRL (Clip) ×1 IMPLANT
CLOTH BEACON ORANGE TIMEOUT ST (SAFETY) ×2 IMPLANT
CONTAINER PREFILL 10% NBF 15ML (MISCELLANEOUS) IMPLANT
DRAPE LG THREE QUARTER DISP (DRAPES) IMPLANT
DRSG OPSITE POSTOP 4X10 (GAUZE/BANDAGES/DRESSINGS) ×2 IMPLANT
DURAPREP 26ML APPLICATOR (WOUND CARE) ×2 IMPLANT
ELECT REM PT RETURN 9FT ADLT (ELECTROSURGICAL) ×2
ELECTRODE REM PT RTRN 9FT ADLT (ELECTROSURGICAL) ×1 IMPLANT
EXTRACTOR VACUUM M CUP 4 TUBE (SUCTIONS) IMPLANT
GLOVE BIO SURGEON STRL SZ 6.5 (GLOVE) ×2 IMPLANT
GOWN STRL REUS W/TWL LRG LVL3 (GOWN DISPOSABLE) ×4 IMPLANT
KIT ABG SYR 3ML LUER SLIP (SYRINGE) IMPLANT
NDL HYPO 25X5/8 SAFETYGLIDE (NEEDLE) ×1 IMPLANT
NEEDLE HYPO 22GX1.5 SAFETY (NEEDLE) IMPLANT
NEEDLE HYPO 25X5/8 SAFETYGLIDE (NEEDLE) ×2 IMPLANT
NS IRRIG 1000ML POUR BTL (IV SOLUTION) ×2 IMPLANT
PACK C SECTION WH (CUSTOM PROCEDURE TRAY) ×2 IMPLANT
PAD OB MATERNITY 4.3X12.25 (PERSONAL CARE ITEMS) ×2 IMPLANT
STAPLER VISISTAT 35W (STAPLE) IMPLANT
STRIP CLOSURE SKIN 1/2X4 (GAUZE/BANDAGES/DRESSINGS) ×1 IMPLANT
SUT CHROMIC 0 CTX 36 (SUTURE) ×4 IMPLANT
SUT PLAIN 0 NONE (SUTURE) IMPLANT
SUT PLAIN 2 0 XLH (SUTURE) IMPLANT
SUT VIC AB 0 CT1 27 (SUTURE) ×6
SUT VIC AB 0 CT1 27XBRD ANBCTR (SUTURE) ×3 IMPLANT
SUT VIC AB 4-0 KS 27 (SUTURE) IMPLANT
SYRINGE CONTROL L 12CC (SYRINGE) IMPLANT
SYRINGE CONTROL LL 12CC (SYRINGE) IMPLANT
TOWEL OR 17X24 6PK STRL BLUE (TOWEL DISPOSABLE) ×2 IMPLANT
TRAY FOLEY CATH 14FR (SET/KITS/TRAYS/PACK) ×2 IMPLANT
WATER STERILE IRR 1000ML POUR (IV SOLUTION) ×2 IMPLANT

## 2014-05-15 NOTE — Anesthesia Postprocedure Evaluation (Signed)
  Anesthesia Post-op Note  Patient: Debbie Walter  Procedure(s) Performed: Procedure(s) with comments: CESAREAN SECTION WITH BILATERAL TUBAL LIGATION (Bilateral) - repeat  edc 06/10/14  Patient is awake, responsive, moving her legs, and has signs of resolution of her numbness. Pain and nausea are reasonably well controlled. Vital signs are stable and clinically acceptable. Oxygen saturation is clinically acceptable. There are no apparent anesthetic complications at this time. Patient is ready for discharge.

## 2014-05-15 NOTE — Anesthesia Preprocedure Evaluation (Addendum)
Anesthesia Evaluation  Patient identified by MRN, date of birth, ID band Patient awake    Reviewed: Allergy & Precautions, H&P , Patient's Chart, lab work & pertinent test results  Airway Mallampati: II TM Distance: >3 FB Neck ROM: full    Dental no notable dental hx.    Pulmonary  breath sounds clear to auscultation  Pulmonary exam normal       Cardiovascular Exercise Tolerance: Good hypertension, On Medications Rhythm:regular Rate:Normal     Neuro/Psych    GI/Hepatic GERD-  Medicated,  Endo/Other  Morbid obesity  Renal/GU      Musculoskeletal   Abdominal   Peds  Hematology   Anesthesia Other Findings   Reproductive/Obstetrics                          Anesthesia Physical Anesthesia Plan  ASA: III  Anesthesia Plan: Spinal, Epidural and Combined Spinal and Epidural   Post-op Pain Management:    Induction:   Airway Management Planned:   Additional Equipment:   Intra-op Plan:   Post-operative Plan:   Informed Consent: I have reviewed the patients History and Physical, chart, labs and discussed the procedure including the risks, benefits and alternatives for the proposed anesthesia with the patient or authorized representative who has indicated his/her understanding and acceptance.   Dental Advisory Given  Plan Discussed with: CRNA  Anesthesia Plan Comments: (Lab work confirmed with CRNA in room. Platelets okay. Discussed spinal anesthetic, and patient consents to the procedure:  included risk of possible headache,backache, failed block, allergic reaction, and nerve injury. This patient was asked if she had any questions or concerns before the procedure started. )       Anesthesia Quick Evaluation

## 2014-05-15 NOTE — H&P (Signed)
Debbie Walter is a 33 year old G 6 P 1041 at 36 weeks and 2 days presents for Repeat LTCS and BTL. She has Chronic Hypertension that has been poorly controlled on 2 anti-hypertensives. She is also on bedrest. Today BP 174/98 despite 2 blood pressure meds. Desires Permanent sterilization. History OB History   Grav Para Term Preterm Abortions TAB SAB Ect Mult Living   6 1 1  4  3 1  1      Past Medical History  Diagnosis Date  . Panic attacks   . Hypertension   . Fibroids   . PCOS (polycystic ovarian syndrome)     takes metformin for this  . Kidney stones 5/14  . GERD (gastroesophageal reflux disease)     takes protonix   Past Surgical History  Procedure Laterality Date  . Parathyroidectomy    . Cesarean section    . Parathyroidectomy    . Hysteroscopy     Family History: family history is negative for Anesthesia problems, Hypotension, Malignant hyperthermia, and Pseudochol deficiency. Social History:  reports that she has never smoked. She has never used smokeless tobacco. She reports that she does not drink alcohol or use illicit drugs.   Prenatal Transfer Tool  Maternal Diabetes: No Genetic Screening: Normal Maternal Ultrasounds/Referrals: Normal Fetal Ultrasounds or other Referrals:  None Maternal Substance Abuse:  No Significant Maternal Medications:  None Significant Maternal Lab Results:  None Other Comments:  None  Review of Systems  All other systems reviewed and are negative.     Blood pressure 170/84, pulse 90, temperature 98.4 F (36.9 C), temperature source Oral, resp. rate 18, height 5\' 1"  (1.549 m), weight 90.719 kg (200 lb), last menstrual period 08/26/2013. Exam Physical Exam  Nursing note and vitals reviewed. Constitutional: She appears well-developed.  HENT:  Head: Normocephalic.  Eyes: Pupils are equal, round, and reactive to light.  Neck: Normal range of motion.  Cardiovascular: Normal rate and regular rhythm.   Respiratory: Effort normal.   GI: Soft.    Prenatal labs: ABO, Rh: --/--/A POS (08/28 1130) Antibody: PENDING (08/28 1130) Rubella:   RPR:    HBsAg:    HIV:    GBS:     Assessment/Plan: IUP at 36 w 2 days Chronic Hypertension Previous C Section Desires Permanent sterilization Repeat LTCS and BTL   Jorge Retz L 05/15/2014, 12:34 PM

## 2014-05-15 NOTE — Transfer of Care (Signed)
Immediate Anesthesia Transfer of Care Note  Patient: Debbie Walter  Procedure(s) Performed: Procedure(s): CESAREAN SECTION (N/A)  Patient Location: PACU  Anesthesia Type:Epidural  Level of Consciousness: awake, alert  and oriented  Airway & Oxygen Therapy: Patient Spontanous Breathing  Post-op Assessment: Report given to PACU RN and Post -op Vital signs reviewed and stable  Post vital signs: Reviewed and stable  Complications: No apparent anesthesia complications

## 2014-05-15 NOTE — Brief Op Note (Signed)
05/15/2014  2:14 PM  PATIENT:  Debbie Walter  33 y.o. female  PRE-OPERATIVE DIAGNOSIS:  IUP  At 61 w 2 days, Previous C Section, Chronic Hypertension, Desires permanent sterilization POST-OPERATIVE DIAGNOSIS:  Same  PROCEDURE:  Procedure(s) with comments: CESAREAN SECTION WITH BILATERAL TUBAL LIGATION (Bilateral) - repeat  edc 06/10/14  SURGEON:  Surgeon(s) and Role:    * Cyril Mourning, MD - Primary  PHYSICIAN ASSISTANT:   ASSISTANTS: none   ANESTHESIA:   spinal  EBL:  Total I/O In: 2000 [I.V.:2000] Out: 750 [Urine:150; Blood:600]  BLOOD ADMINISTERED:none  DRAINS: Urinary Catheter (Foley)   LOCAL MEDICATIONS USED:  NONE  SPECIMEN:  No Specimen  DISPOSITION OF SPECIMEN:  N/A  COUNTS:  YES  TOURNIQUET:  * No tourniquets in log *  DICTATION: .Other Dictation: Dictation Number [id number]  PLAN OF CARE: Admit to inpatient   PATIENT DISPOSITION:  PACU - hemodynamically stable.   Delay start of Pharmacological VTE agent (>24hrs) due to surgical blood loss or risk of bleeding: not applicable

## 2014-05-15 NOTE — Anesthesia Procedure Notes (Signed)
Spinal  Patient location during procedure: OR Preanesthetic Checklist Completed: patient identified, site marked, surgical consent, pre-op evaluation, timeout performed, IV checked, risks and benefits discussed and monitors and equipment checked Spinal Block Patient position: sitting Prep: DuraPrep Patient monitoring: cardiac monitor, continuous pulse ox, blood pressure and heart rate Approach: midline Location: L3-4 Injection technique: catheter Needle Needle type: Tuohy and Sprotte  Needle gauge: 24 G Needle length: 12.7 cm Needle insertion depth: 6 cm Catheter type: closed end flexible Catheter size: 19 g Catheter at skin depth: 12 cm Assessment Sensory level: T4 Additional Notes Spinal Dosage in OR  Bupivicaine ml       1.1 PFMS04   mcg        150 Fentanyl mcg            25

## 2014-05-15 NOTE — Consult Note (Signed)
Neonatology Note:   Attendance at C-section:    I was asked by Dr. Helane Rima to attend this repeat C/S at 36 2/7 weeks due to poorly controlled chronic hypertension . The mother is a H4H8O8 A pos, GBS not found with fibroids and a history of panic attacks, chronic hypertension, and PCOS. ROM at delivery, fluid clear. Infant vigorous with good spontaneous cry and tone. Needed no suctioning. Ap 8/9. Lungs clear to ausc in DR. To CN to care of Pediatrician.   Real Cons, MD

## 2014-05-15 NOTE — Transfer of Care (Signed)
Immediate Anesthesia Transfer of Care Note  Patient: Debbie Walter  Procedure(s) Performed: Procedure(s) with comments: CESAREAN SECTION WITH BILATERAL TUBAL LIGATION (Bilateral) - repeat  edc 06/10/14  Patient Location: PACU  Anesthesia Type:Spinal  Level of Consciousness: awake, alert , oriented and patient cooperative  Airway & Oxygen Therapy: Patient Spontanous Breathing  Post-op Assessment: Report given to PACU RN and Post -op Vital signs reviewed and stable  Post vital signs: Reviewed and stable  Complications: No apparent anesthesia complications

## 2014-05-16 LAB — CBC
HCT: 24.9 % — ABNORMAL LOW (ref 36.0–46.0)
HEMOGLOBIN: 8.6 g/dL — AB (ref 12.0–15.0)
MCH: 27.3 pg (ref 26.0–34.0)
MCHC: 34.5 g/dL (ref 30.0–36.0)
MCV: 79 fL (ref 78.0–100.0)
Platelets: 166 10*3/uL (ref 150–400)
RBC: 3.15 MIL/uL — AB (ref 3.87–5.11)
RDW: 14.4 % (ref 11.5–15.5)
WBC: 9.3 10*3/uL (ref 4.0–10.5)

## 2014-05-16 NOTE — Op Note (Signed)
Debbie Walter, Debbie Walter                ACCOUNT NO.:  [id number]  MEDICAL RECORD NO.:  [id number]  LOCATION:  9146                          FACILITY:  [facility]  PHYSICIAN:  Page Pucciarelli L. Willette Mudry, M.D.DATE OF BIRTH:  Aug 29, 1981  DATE OF PROCEDURE:  05/15/2014 DATE OF DISCHARGE:                              OPERATIVE REPORT   PREOPERATIVE DIAGNOSES:  Intrauterine pregnancy at 36 weeks and 2 days, previous cesarean section x1, chronic hypertension and desires permanent sterilization.  POSTOPERATIVE DIAGNOSIS:  Intrauterine pregnancy at 36 weeks and 2 days, previous cesarean section x1, chronic hypertension and desires permanent sterilization.  PROCEDURES:  Repeat low-transverse cesarean section and bilateral tubal ligation with placement of Filshie clips.  SURGEON:  Ibtisam Benge L. Helane Rima, M.D.  ANESTHESIA:  Spinal.  EBL:  Less than 500 mL.  COMPLICATIONS:  None.  DRAINS:  Foley.  DESCRIPTION OF PROCEDURE:  The patient was taken to the operating room. She was administered anesthesia.  She was then prepped and draped in usual sterile fashion and a Foley catheter was inserted.  An Allis test was performed, which was adequate.  A low-transverse incision was made, carried down to the fascia.  The fascia was scored in the midline and extended laterally.  The rectus muscles were separated in the midline. The peritoneum was entered bluntly.  The peritoneal incision was then stretched.  The bladder blade was inserted.  The lower uterine segment was identified.  The bladder flap was created sharply and then digitally.  The bladder blade was readjusted.  A low-transverse incision was made in the uterus.  The baby was in cephalic presentation and delivered easily with one gentle pull of the vacuum.  She was a female infant, Apgars were 8 at 1 minute and 9 at 5 minutes.  The cord was clamped and cut.  The baby was handed to the awaiting pediatricians. The uterus was exteriorized and it was cleared of  all clots and debris after the placenta was delivered manually.  The uterine incision was closed in 2 layers using 0 chromic in a running locked stitch.  Filshie clips were then placed across each midportion of each fallopian tube. The uterus was returned to the abdomen.  Irrigation was performed.  All sites were inspected and hemostasis was noted.  The peritoneum was closed using 0 Vicryl.  The fascia was closed using 0 Vicryl in running stitch. After irrigation, the skin was closed with a 3-0 Vicryl on a Keith needle.  Steri-Strips were applied.  Honeycomb dressing was applied. All sponge, lap, and instrument counts were correct x2.  The patient went to the recovery room in stable condition.     Kirk Sampley L. Helane Rima, M.D.     Nevin Bloodgood  D:  05/15/2014  T:  05/16/2014  Job:  [id number]

## 2014-05-16 NOTE — Progress Notes (Signed)
Subjective: Postpartum Day 1: Cesarean Delivery Patient reports incisional pain and tolerating PO.    Objective: Vital signs in last 24 hours: Temp:  [98 F (36.7 C)-99.3 F (37.4 C)] 98 F (36.7 C) (08/29 0610) Pulse Rate:  [81-105] 89 (08/29 0610) Resp:  [18-22] 18 (08/29 0610) BP: (134-180)/(70-98) 146/73 mmHg (08/29 0610) SpO2:  [90 %-100 %] 96 % (08/29 0610) Weight:  [90.719 kg (200 lb)] 90.719 kg (200 lb) (08/28 1123)  Physical Exam:  General: alert, cooperative and appears stated age 33: appropriate Uterine Fundus: firm Incision: healing well, no significant drainage, no dehiscence, no significant erythema DVT Evaluation: No evidence of DVT seen on physical exam.   Recent Labs  05/15/14 1130 05/16/14 0548  HGB 11.5* 8.6*  HCT 33.2* 24.9*    Assessment/Plan: Chronic Hypertension - improved a lot postpartum. Continue current medications Status post Cesarean section. Doing well postoperatively.  Continue current care.  Natsha Guidry L 05/16/2014, 8:01 AM

## 2014-05-16 NOTE — Anesthesia Postprocedure Evaluation (Signed)
  Anesthesia Post-op Note  Patient: Debbie Walter  Procedure(s) Performed: Procedure(s): CESAREAN SECTION (N/A)  Patient Location: PACU and Mother/Baby  Anesthesia Type:Spinal and Epidural  Level of Consciousness: awake, alert  and oriented  Airway and Oxygen Therapy: Patient Spontanous Breathing  Post-op Pain: mild  Post-op Assessment: Patient's Cardiovascular Status Stable, Respiratory Function Stable, No signs of Nausea or vomiting, Adequate PO intake and Pain level controlled  Post-op Vital Signs: Reviewed and stable  Last Vitals:  Filed Vitals:   05/16/14 0610  BP: 146/73  Pulse: 89  Temp: 36.7 C  Resp: 18    Complications: No apparent anesthesia complications

## 2014-05-17 MED ORDER — OXYCODONE-ACETAMINOPHEN 5-325 MG PO TABS: 1.0000 | ORAL_TABLET | ORAL | Status: DC | PRN

## 2014-05-17 MED ORDER — METFORMIN HCL ER (MOD) 500 MG PO TB24: 500.0000 mg | ORAL_TABLET | Freq: Every day | ORAL | Status: DC

## 2014-05-17 MED ORDER — IBUPROFEN 600 MG PO TABS: 600.0000 mg | ORAL_TABLET | Freq: Four times a day (QID) | ORAL | Status: DC

## 2014-05-17 NOTE — Progress Notes (Signed)
Subjective: Postpartum Day 2: Cesarean Delivery Patient reports incisional pain, tolerating PO, + flatus and no problems voiding.    Objective: Vital signs in last 24 hours: Temp:  [97.7 F (36.5 C)-98.2 F (36.8 C)] 98 F (36.7 C) (08/30 0600) Pulse Rate:  [83-100] 88 (08/30 0600) Resp:  [18-20] 18 (08/30 0600) BP: (126-158)/(70-94) 126/78 mmHg (08/30 0600) SpO2:  [95 %-100 %] 96 % (08/30 0600)  Physical Exam:  General: alert, cooperative and appears stated age 7: appropriate Uterine Fundus: firm Incision: healing well, no significant drainage, no dehiscence, no significant erythema DVT Evaluation: No evidence of DVT seen on physical exam.   Recent Labs  05/15/14 1130 05/16/14 0548  HGB 11.5* 8.6*  HCT 33.2* 24.9*    Assessment/Plan: Status post Cesarean section. Doing well postoperatively.  Discharge home with standard precautions and return to clinic in 1 week.  Jerolyn Flenniken L 05/17/2014, 7:54 AM

## 2014-05-17 NOTE — Discharge Summary (Signed)
Obstetric Discharge Summary Reason for Admission: cesarean section Prenatal Procedures: none Intrapartum Procedures: cesarean: low cervical, transverse and tubal ligation Postpartum Procedures: none Complications-Operative and Postpartum: none Hemoglobin  Date Value Ref Range Status  05/16/2014 8.6* 12.0 - 15.0 g/dL Final     REPEATED TO VERIFY     DELTA CHECK NOTED     HCT  Date Value Ref Range Status  05/16/2014 24.9* 36.0 - 46.0 % Final    Physical Exam:  General: alert, cooperative and appears stated age 33: appropriate Uterine Fundus: firm Incision: healing well, no significant drainage, no dehiscence, no significant erythema DVT Evaluation: No evidence of DVT seen on physical exam.  Discharge Diagnoses: chronic hypertension  Discharge Information: Date: 05/17/2014 Activity: pelvic rest Diet: routine Medications: Ibuprofen, Percocet and metformin and bp meds Condition: stable Instructions: refer to practice specific booklet Discharge to: home   Newborn Data: Live born female  Birth Weight: 6 lb 1 oz (2750 g) APGAR: 8, 9  Home with mother.  Adonte Vanriper L 05/17/2014, 7:54 AM

## 2014-05-17 NOTE — Progress Notes (Signed)
Clinical Social Work Department PSYCHOSOCIAL ASSESSMENT - MATERNAL/CHILD 05/17/2014  Patient:  Debbie Walter, Debbie Walter  Account Number:  [id number]  Admit Date:  05/15/2014  Ardine Eng Name:   Debbie Walter    Clinical Social Worker:  Xylan Sheils, LCSW   Date/Time:  05/17/2014 10:30 AM  Date Referred:  05/17/2014   Referral source  Central Nursery     Referred reason  Depression/Anxiety   Other referral source:    I:  FAMILY / Gates legal guardian:  PARENT  Guardian - Name Guardian - Age Guardian - Address  Debbie Walter,Debbie Walter [phone]  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  Parents are married and have one other dependent age 35.  Spouse was present and very attentive to newborn.   Mother reports hx of depression and anxiety.  Informed that she participated in counseling 2 years ago and was also prescribed medication.  Informed that she has learned how to manage the depression without medication.   Mother reports no current symptoms of depression or anxiety. She denies any hx of substance abuse.  Spoke with the family about the signs/symptoms of PP Depression.  No acute social  concerns related at this time.    Mother informed of social work Fish farm manager.      VI SOCIAL WORK PLAN Social Work Plan  No Further Intervention Required / No Barriers to Discharge

## 2014-05-18 ENCOUNTER — Encounter (HOSPITAL_COMMUNITY): Payer: Self-pay | Admitting: Obstetrics and Gynecology

## 2014-05-29 ENCOUNTER — Ambulatory Visit (HOSPITAL_COMMUNITY): Payer: Federal, State, Local not specified - PPO

## 2014-07-01 ENCOUNTER — Other Ambulatory Visit: Payer: Self-pay | Admitting: Obstetrics and Gynecology

## 2014-07-02 LAB — CYTOLOGY - PAP

## 2014-07-06 ENCOUNTER — Encounter: Payer: Federal, State, Local not specified - PPO | Attending: Obstetrics and Gynecology | Admitting: *Deleted

## 2014-07-06 ENCOUNTER — Encounter: Payer: Self-pay | Admitting: *Deleted

## 2014-07-06 VITALS — Ht 61.0 in | Wt 197.3 lb

## 2014-07-06 DIAGNOSIS — E669 Obesity, unspecified: Secondary | ICD-10-CM | POA: Diagnosis present

## 2014-07-06 DIAGNOSIS — Z713 Dietary counseling and surveillance: Secondary | ICD-10-CM | POA: Insufficient documentation

## 2014-07-06 DIAGNOSIS — Z6837 Body mass index (BMI) 37.0-37.9, adult: Secondary | ICD-10-CM | POA: Insufficient documentation

## 2014-07-06 NOTE — Progress Notes (Signed)
Medical Nutrition Therapy:  Appt start time: 4920 end time:  1007.  Assessment:  Patient here today for obesity. She is 7 weeks postpartum. Patient did not gain weight during this pregnancy, and lost about 20 pounds postpartum. She has since regained most of this weight. She also has chronic hypertension, which was exacerbated by pregnancy, and has yet to return to prepregnancy levels. She reports that her diet quality is poor, eating out every evening and snacking on junk food all day. She will be working from home starting November 1. She has a history of dieting and weight loss, but would like simple meal plans to eat healthier.   MEDICATIONS: See list   DIETARY INTAKE:     24-hr recall:  B ( AM): Oatmeal OR ginger snaps, potato chips  Snk ( AM): None  L ( PM): Doesn't eat structured lunch, snacks on junk food throughout the day Snk ( PM): See above D ( PM): [employer] sampler tray: meatloaf, ham, chicken dumplings, mashed potatoes with gravy, creamed corn, stewed apples, 1-2 biscuits Snk ( PM): None Beverages: Water, 8 oz soda, juice 2x/wk  Usual physical activity: None  Estimated energy needs: 1500 calories 188 g carbohydrates 94 g protein 42 g fat  Progress Towards Goal(s):  In progress.   Nutritional Diagnosis:   Chapel-3.3 Overweight/obesity As related to excessive energy intake.  As evidenced by BMI >30.    Intervention:  Nutrition counseling. We discussed strategies for weight loss, including balancing nutrients (carbs, protein, fat), portion control, healthy snacks, and exercise.   Goals:  1. 1 pound weight loss per week.  2. Use plate method for meal planning, balancing protein, starch, and vegetables 3. Snack 2 times a day with a maximum daily snack calorie intake of 300 kcal/day.  4. 30 minutes walking 5 days weekly.  5. Eat 3 meals daily.  6. Limit fat and salt intake.  Handouts given during visit include:  Weight loss tips  Meal plan  card  Monitoring/Evaluation:  Dietary intake, exercise, and body weight prn.

## 2014-07-20 ENCOUNTER — Encounter: Payer: Self-pay | Admitting: *Deleted

## 2014-08-03 ENCOUNTER — Ambulatory Visit: Payer: Federal, State, Local not specified - PPO | Admitting: *Deleted

## 2014-10-01 ENCOUNTER — Encounter (HOSPITAL_COMMUNITY): Payer: Self-pay | Admitting: Obstetrics and Gynecology

## 2014-10-15 ENCOUNTER — Encounter: Payer: Self-pay | Admitting: Internal Medicine

## 2014-10-15 ENCOUNTER — Ambulatory Visit (INDEPENDENT_AMBULATORY_CARE_PROVIDER_SITE_OTHER): Payer: Federal, State, Local not specified - PPO | Admitting: Internal Medicine

## 2014-10-15 VITALS — BP 104/62 | HR 83 | Temp 98.6°F | Resp 12 | Ht 62.0 in | Wt 202.0 lb

## 2014-10-15 DIAGNOSIS — E282 Polycystic ovarian syndrome: Secondary | ICD-10-CM

## 2014-10-15 DIAGNOSIS — I10 Essential (primary) hypertension: Secondary | ICD-10-CM

## 2014-10-15 DIAGNOSIS — R825 Elevated urine levels of drugs, medicaments and biological substances: Secondary | ICD-10-CM

## 2014-10-15 DIAGNOSIS — Z8639 Personal history of other endocrine, nutritional and metabolic disease: Secondary | ICD-10-CM

## 2014-10-15 NOTE — Progress Notes (Addendum)
Patient ID: Debbie Walter, female   DOB: [birth year], 34 y.o.   MRN: [id number]    HPI  Debbie Walter is a 34 y.o.-year-old female, referred by her PCP, Dr. Elijio Miles, for management of elevated metanephrines, hypercalcemia, and PCOS.  Pt. has been dx with hypertension dx at 66 (2003), had preeclampsia with her last pregnancy (has a 23 mo old). She also has a 3.5 mo old - was on Alpha methyl-dopa >> BP controlled then. This med did not help for the second pregnancy >> was on Metoprolol and Verapamil. Now off Verapamil, taking: - Metoprolol 50 mg bid - has been on this for years, increased 1 year ago - Valsartan 160 mg daily - Chlorthalidone 25 mg daily She was on Amlodipine 5 mg daily >> dropped BP too low and developed dizziness.  During the investigation for hypertension, she had a 24-hour urine fractionated metanephrine level checked in 07/20/2014 and the metanephrines returned slightly elevated as shown below. Patient mentions that she was still drinking caffeinated drinks and wine cooler during the period of the collection: Total volume 2.6 L Metanephrine 233 g per 24 hour (36-190) Normetanephrine 382 (35-482) Total metanephrines 614 (782-956).  Of note, at that time, she had the PRA 0.44 (0.25-5.8) with a potassium of 4.1. The test was run at [facility].  At the last visit with PCP, she had a repeat 24-hour urine for fractionated metanephrines, which again showed a slightly elevated metanephrine, practically unchanged from prior, despite being off caffeine and alcohol (not on Buspar yet):  Total volume 1.9 L Metanephrine 238 g per 24 hour (36-190) Normetanephrine 282 (35-482) Total metanephrines 520 (213-086).   Pt describes: - + fatigue - + Anxiety, especially after she found out that she might have an adrenal tumor.   She has anxiety attacks - mostly at night - after she was told about the abnormal results - now better on Buspar: - + palpitations - no HA - rarely only, not  associated with attacks - + CP - + SOB - no weight gain  Patient describes that she has a history of R parathyroidectomy at 34 y/o. She had HTN then. She saw endo then >> Dr Windell Norfolk >> had issues with low calcium then >> anxiety >> started to take calcium. She is on calcium 600-400 mg-U every other day (switched after calcium 10.7 on 07/16/2014). I reviewed the records along with her and the most recent calcium checked was 11 on 09/03/2014.  She had a kidney stone and had lithotripsy in 2014.  Pt denies feeling nodules in neck, hoarseness, dysphagia/odynophagia, SOB with lying down.  No FH of thyroid cancer. FH with HTN: M,F,S. No pituitary tumor FH.  ROS: Constitutional: no weight gain/loss, no fatigue, no subjective hyperthermia/hypothermia, + poor sleep Eyes: no blurry vision, no xerophthalmia ENT: no sore throat, no nodules palpated in throat, no dysphagia/odynophagia, no hoarseness Cardiovascular: no CP/SOB/+ palpitations/no leg swelling Respiratory: no cough/SOB Gastrointestinal: no N/V/D/C Musculoskeletal: no muscle/joint aches Skin: no rashes Neurological: no tremors/numbness/tingling/dizziness Psychiatric: no depression/+ anxiety  Past Medical History  Diagnosis Date  . Panic attacks   . Hypertension   . Fibroids   . PCOS (polycystic ovarian syndrome)     takes metformin for this  . Kidney stones 5/14  . GERD (gastroesophageal reflux disease)     takes protonix   Past Surgical History  Procedure Laterality Date  . Parathyroidectomy    . Cesarean section    . Parathyroidectomy    . Hysteroscopy    .  Cesarean section with bilateral tubal ligation Bilateral 05/15/2014    Procedure: CESAREAN SECTION WITH BILATERAL TUBAL LIGATION;  Surgeon: Cyril Mourning, MD;  Location: [facility];  Service: Obstetrics;  Laterality: Bilateral;  repeat  edc 06/10/14   History   Social History  . Marital Status: Married    Spouse Name: N/A    Number of Children: 2 (4 miscarriages)    Occupational History  . Nurse    Social History Main Topics  . Smoking status: Never Smoker   . Smokeless tobacco: Never Used  . Alcohol Use: No  . Drug Use: No  . Sexual Activity: Yes   Current Outpatient Prescriptions on File Prior to Visit  Medication Sig Dispense Refill  . metFORMIN (GLUMETZA) 500 MG (MOD) 24 hr tablet Take 1 tablet (500 mg total) by mouth daily with breakfast. 60 tablet 0  . metoprolol (LOPRESSOR) 50 MG tablet Take 1 tablet (50 mg total) by mouth 2 (two) times daily. 60 tablet 3  . Prenatal Vit-Fe Fumarate-FA (PRENATAL MULTIVITAMIN) TABS tablet Take 1 tablet by mouth daily at 12 noon.    . Fe Cbn-Fe Gluc-FA-B12-C-DSS (FERRALET 90 PO) Take 1 tablet by mouth.    Marland Kitchen ibuprofen (ADVIL,MOTRIN) 600 MG tablet Take 1 tablet (600 mg total) by mouth every 6 (six) hours. (Patient not taking: Reported on 10/15/2014) 30 tablet 0  . metFORMIN (GLUMETZA) 500 MG (MOD) 24 hr tablet Take 500 mg by mouth daily with breakfast.    . oxyCODONE-acetaminophen (PERCOCET/ROXICET) 5-325 MG per tablet Take 1-2 tablets by mouth every 4 (four) hours as needed for severe pain (moderate - severe pain). (Patient not taking: Reported on 10/15/2014) 60 tablet 0  . verapamil (CALAN-SR) 180 MG CR tablet Take 1 tablet (180 mg total) by mouth 2 (two) times daily. (Patient not taking: Reported on 10/15/2014) 60 tablet 3   No current facility-administered medications on file prior to visit.   Allergies  Allergen Reactions  . Procardia [Nifedipine] Other (See Comments)    headache  . Wellbutrin [Bupropion] Other (See Comments)    "Makes my blood pressure go up - I don't ever want to take it again"  . Labetalol Other (See Comments)    "makes me feel like things are crawling through my head"  . Zithromax [Azithromycin] Other (See Comments)    Gi upset.  Diarrhea   Family History  Problem Relation Age of Onset  . Anesthesia problems Neg Hx   . Hypotension Neg Hx   . Malignant hyperthermia Neg Hx   .  Pseudochol deficiency Neg Hx     diabetes in mother  Hypertension in mother and father  Acute MI in mother  Rectal cancer in father  PE: BP 104/62 mmHg  Pulse 83  Temp(Src) 98.6 F (37 C) (Oral)  Resp 12  Ht _0  (1.575 m)  Wt 202 lb (91.627 kg)  BMI 36.94 kg/m2  SpO2 97% Wt Readings from Last 3 Encounters:  10/15/14 202 lb (91.627 kg)  07/06/14 197 lb 4.8 oz (89.495 kg)  05/15/14 200 lb (90.719 kg)   Constitutional: obese, Bureau fat pads not enlarged  in NAD Eyes: PERRLA, EOMI, no exophthalmos ENT: moist mucous membranes, no thyromegaly, no cervical lymphadenopathy Cardiovascular: RRR, No MRG Respiratory: CTA B Gastrointestinal: abdomen soft, NT, ND, BS+ Musculoskeletal: no deformities, strength intact in all 4 Skin: moist, warm, no rashes Neurological: no tremor with outstretched hands, DTR normal in all 4  ASSESSMENT: 1. Elevated urinary metanephrines  - Mild  1'.  Hypertension  2. Hypercalcemia - History of right parathyroidectomy for primary hyperparathyroidism  3. PCOS  PLAN:  1. Mildly elevated urinary metanephrines and hypertension - Patient with history of hypertension since she was 34 years old, and recent preeclampsia with her last pregnancy, 5 months ago. Urine metanephrines checked after birth returned slightly elevated and this was maintained at the repeat level this month. Patient is not using Effexor, Adderall, TCA, but she is on BuSpar.  Of note, she had a CT of her abdomen in 2014 that did not show an adrenal mass. - We had a long discussion about the fact that her urinary metanephrines are very slightly elevated, and this is a not uncommon in patients with essential hypertension, during panic attacks, or if taking certain medicines, coffee, smoking, alcohol. To be clinically significant, the elevation should be at least 2 times higher than the upper limit of normal, most frequently approximately 4 times. It is possible that they are still elevated  after her preeclampsia, there were cases in which elevated metanephrines slowly decreased after pregnancy during following months. - The one thing that it's concerning is that she has a history of parathyroidectomy for primary hyperparathyroidism while very young might 52. She does not have family history of hypercalcemia, kidney stones, thyroid cancer, pheochromocytoma, to point towards an MEN2A syndrome, but this can happen spontaneously - Because of her previous history of hyperparathyroidism (an unknown current status post, I would have a lower threshold to investigate her further for pheochromocytoma if the tests still returned elevated in 2 months. I would first check an adrenal CT and even possibly an MIBG scan. - For now, we decided to repeat her urinary metanephrines along with plasma metanephrines in 2 months (she needs to be off BuSpar for 3 weeks before checking this). I would've liked also check a chromogranin A, but she is on PPIs which can falsely elevated and she does not think she can come off.  2. Hypercalcemia - Please see above - She has a history of primary hyperparathyroidism at 15 and she is now status post R parathyroidectomy. Her recent calcium levels have been high, highest at 11. I advised her to stop her calcium supplements now and we will have her back in 2 months for the following labs: iPTH Calcium Vitamin D  3. PCOS - Patient continues on metformin. - I will check a hemoglobin A1c at next visit per her request.  I will see the patient back after the above results return, to discuss about further plan.  - time spent with the patient and her husband: 1 hour, of which >50% was spent in obtaining information about her symptoms, reviewing her previous labs, evaluations, counseling her about her condition (please see the discussed topics above), and developing a plan to further investigate it. She had a number of questions which I addressed.  I researched interaction  between BuSpar and the catecholamine detection assay, and there is a significant interaction between the 2. I called and advised the patient to try to stop BuSpar 3 weeks prior to checking the catecholamines again. I strongly advised her to call PCP and discuss about the planned first and see if there is a need to start another anxiety medication instead.  Component     Latest Ref Rng 12/02/2014          Sodium     135 - 145 mEq/L 139  Potassium     3.5 - 5.1 mEq/L 3.2 (L)  Chloride  96 - 112 mEq/L 103  CO2     19 - 32 mEq/L 28  Glucose     70 - 99 mg/dL 111 (H)  BUN     6 - 23 mg/dL 15  Creatinine     0.40 - 1.20 mg/dL 0.77  Calcium     8.4 - 10.5 mg/dL 10.3  GFR     >60.00 mL/min 109.36  WBC     4.0 - 10.5 K/uL 9.8  RBC     3.87 - 5.11 Mil/uL 4.92  Hemoglobin     12.0 - 15.0 g/dL 13.6  HCT     36.0 - 46.0 % 40.0  MCV     78.0 - 100.0 fl 81.4  MCHC     30.0 - 36.0 g/dL 33.8  RDW     11.5 - 15.5 % 14.9  Platelets     150.0 - 400.0 K/uL 324.0  TSH     0.450 - 4.500 uIU/mL 1.960  Free T4     0.82 - 1.77 ng/dL 1.12  PTH     15 - 65 pg/mL 57  VITD     30.00 - 100.00 ng/mL 39.36  Hemoglobin A1C     4.6 - 6.5 % 6.1   Potassium still low.  TFTs normal. Ca normal; PTH normal. Vit D normal. Hemoglobin A1c elevated. Will advise her to watch her diet for now.  CBC normal. We could not perform the plasma metanephrines because she was still on BuSpar.Marland KitchenMarland Kitchen

## 2014-10-15 NOTE — Patient Instructions (Addendum)
Please do another urine collection in 2 months. No alcohol, smoking or caffeinated drinks during the collection. Patient information (Up-to-Date): Collection of a 24-hour urine specimen   - You should collect every drop of urine during each 24-hour period. It does not matter how much or little urine is passed each time, as long as every drop is collected. - Begin the urine collection in the morning after you wake up, after you have emptied your bladder for the first time. - Urinate (empty the bladder) for the first time and flush it down the toilet. Note the exact time (eg, 6:15 AM). You will begin the urine collection at this time. - Collect every drop of urine during the day and night in an empty collection bottle. Store the bottle at room temperature or in the refrigerator. - If you need to have a bowel movement, any urine passed with the bowel movement should be collected. Try not to include feces with the urine collection. If feces does get mixed in, do not try to remove the feces from the urine collection bottle. - Finish by collecting the first urine passed the next morning, adding it to the collection bottle. This should be within ten minutes before or after the time of the first morning void on the first day (which was flushed). In this example, you would try to void between 6:05 and 6:25 on the second day. - If you need to urinate one hour before the final collection time, drink a full glass of water so that you can void again at the appropriate time. If you have to urinate 20 minutes before, try to hold the urine until the proper time. - Please note the exact time of the final collection, even if it is not the same time as when collection began on day 1. - The bottle(s) may be kept at room temperature for a day or two, but should be kept cool or refrigerated for longer periods of time.  Please also give a blood sample when you come back to bring the urine collection. Rest in the waiting room  for at least 15 min before the blood draw.  Please stop the calcium supplement. Continue vitamin D 1000 units daily.  Please come back for an appt in 2.5 months to discuss results.

## 2014-11-13 ENCOUNTER — Telehealth: Payer: Self-pay | Admitting: Internal Medicine

## 2014-11-13 NOTE — Telephone Encounter (Signed)
That would be OK! No interference with the tests.

## 2014-11-13 NOTE — Telephone Encounter (Signed)
Please read message below and advise.  

## 2014-11-13 NOTE — Telephone Encounter (Signed)
Patient called stating that her PCP would like for her to take paxil 20mg   Is this medication ok to take    Thank you

## 2014-11-16 NOTE — Telephone Encounter (Signed)
Called pt and lvm advising her per Dr Gherghe's note.  

## 2014-12-01 ENCOUNTER — Telehealth: Payer: Self-pay | Admitting: Internal Medicine

## 2014-12-01 NOTE — Telephone Encounter (Signed)
Returned pt's call and advised her ok to come in and have labs done. Pt understood.

## 2014-12-01 NOTE — Telephone Encounter (Signed)
Patient called stating that she is not feeling well and she would like to do labs tomorrow morning    Please advise patient   Thank you

## 2014-12-01 NOTE — Telephone Encounter (Signed)
Please read message below and advise.  

## 2014-12-01 NOTE — Telephone Encounter (Signed)
OK 

## 2014-12-02 ENCOUNTER — Other Ambulatory Visit: Payer: Self-pay | Admitting: *Deleted

## 2014-12-02 ENCOUNTER — Other Ambulatory Visit (INDEPENDENT_AMBULATORY_CARE_PROVIDER_SITE_OTHER): Payer: Federal, State, Local not specified - PPO

## 2014-12-02 DIAGNOSIS — R825 Elevated urine levels of drugs, medicaments and biological substances: Secondary | ICD-10-CM

## 2014-12-02 DIAGNOSIS — I10 Essential (primary) hypertension: Secondary | ICD-10-CM

## 2014-12-02 DIAGNOSIS — E282 Polycystic ovarian syndrome: Secondary | ICD-10-CM

## 2014-12-02 DIAGNOSIS — Z8639 Personal history of other endocrine, nutritional and metabolic disease: Secondary | ICD-10-CM

## 2014-12-02 LAB — CBC
HEMATOCRIT: 40 % (ref 36.0–46.0)
Hemoglobin: 13.6 g/dL (ref 12.0–15.0)
MCHC: 33.8 g/dL (ref 30.0–36.0)
MCV: 81.4 fl (ref 78.0–100.0)
PLATELETS: 324 10*3/uL (ref 150.0–400.0)
RBC: 4.92 Mil/uL (ref 3.87–5.11)
RDW: 14.9 % (ref 11.5–15.5)
WBC: 9.8 10*3/uL (ref 4.0–10.5)

## 2014-12-02 LAB — BASIC METABOLIC PANEL
BUN: 15 mg/dL (ref 6–23)
CO2: 28 meq/L (ref 19–32)
Calcium: 10.3 mg/dL (ref 8.4–10.5)
Chloride: 103 mEq/L (ref 96–112)
Creatinine, Ser: 0.77 mg/dL (ref 0.40–1.20)
GFR: 109.36 mL/min (ref >60.00)
Glucose, Bld: 111 mg/dL — ABNORMAL HIGH (ref 70–99)
Potassium: 3.2 mEq/L — ABNORMAL LOW (ref 3.5–5.1)
Sodium: 139 mEq/L (ref 135–145)

## 2014-12-02 LAB — HEMOGLOBIN A1C: HEMOGLOBIN A1C: 6.1 % (ref 4.6–6.5)

## 2014-12-02 LAB — VITAMIN D 25 HYDROXY (VIT D DEFICIENCY, FRACTURES): VITD: 39.36 ng/mL (ref 30.00–100.00)

## 2014-12-03 LAB — PARATHYROID HORMONE, INTACT (NO CA): PTH: 57 pg/mL (ref 15–65)

## 2014-12-03 LAB — TSH+FREE T4
FREE T4: 1.12 ng/dL (ref 0.82–1.77)
TSH: 1.96 u[IU]/mL (ref 0.450–4.500)

## 2014-12-04 ENCOUNTER — Encounter: Payer: Self-pay | Admitting: *Deleted

## 2014-12-16 ENCOUNTER — Telehealth: Payer: Self-pay | Admitting: Endocrinology

## 2014-12-16 NOTE — Telephone Encounter (Signed)
Pt wondering if it is ok for pt to start belviq her PCP asked her to ask [place]

## 2014-12-17 NOTE — Telephone Encounter (Signed)
Left voicemail advising of note below. Requested call back if pt would like to discuss.

## 2014-12-17 NOTE — Telephone Encounter (Signed)
Okay as long as she is not taking an antidepressant such as Zoloft or Prozac

## 2014-12-17 NOTE — Telephone Encounter (Signed)
See note below and please advise, Thanks! 

## 2014-12-22 ENCOUNTER — Encounter: Payer: Self-pay | Admitting: Internal Medicine

## 2014-12-22 NOTE — Progress Notes (Signed)
Received labs from [facility], drawn on 12/15/2014: Calcium 10.9 (8.4-10.5), magnesium 1.8 (1.5-2.5), BUN/creatinine 10/0.63. Sodium 141, potassium 3.6.

## 2014-12-28 ENCOUNTER — Telehealth: Payer: Self-pay | Admitting: Internal Medicine

## 2014-12-28 NOTE — Telephone Encounter (Signed)
Please read message below and advise.  

## 2014-12-28 NOTE — Telephone Encounter (Signed)
Patient called and would like to know if she can come in for a potassium check   Willing to go to [facility]    Please advise    Thank you

## 2014-12-28 NOTE — Telephone Encounter (Signed)
I prefer that this be followed by her PCP.

## 2014-12-29 ENCOUNTER — Telehealth: Payer: Self-pay | Admitting: Internal Medicine

## 2014-12-29 NOTE — Telephone Encounter (Signed)
Patient stated that she no longer need the potassium level test done, Dr Helane Rima (obgyn)will be drawing a C met test on her which has potassium in it , patient need all her lab work sent  To Dr Helane Rima office Phone # [phone] Fax [phone]

## 2014-12-29 NOTE — Telephone Encounter (Signed)
Faxed lab results to Dr Helane Rima at [phone].

## 2015-01-04 ENCOUNTER — Telehealth: Payer: Self-pay | Admitting: Internal Medicine

## 2015-01-04 NOTE — Telephone Encounter (Signed)
Patient called and would like to speak with [facility] regarding the 24 hour urine collection  She feels she may have "messed it up"    Please call patient    Thank You

## 2015-01-05 NOTE — Telephone Encounter (Signed)
See note below and please advise, Thanks! 

## 2015-01-05 NOTE — Telephone Encounter (Signed)
I contacted pt and advised of note below. Pt states that she has not started her 24 hour urine test yet. Pt wanted to verify what she should stay off of before starting the test. Pt voiced understanding and stated she is going to start the test on 01/06/2015.

## 2015-01-05 NOTE — Telephone Encounter (Signed)
Patient is calling about her 24 hr urine she would like to know if drinking coffee would mess up her test, please advise

## 2015-01-05 NOTE — Telephone Encounter (Signed)
If she is doing this for catecholamines, yes, she should be off caffeine. See below: "The test is affected by stress, drugs, smoking and various foods such as caffeine-containing drinks and alcohol". She may need to get another jug.

## 2015-01-07 ENCOUNTER — Other Ambulatory Visit: Payer: Self-pay | Admitting: *Deleted

## 2015-01-07 ENCOUNTER — Other Ambulatory Visit: Payer: Federal, State, Local not specified - PPO

## 2015-01-07 DIAGNOSIS — R825 Elevated urine levels of drugs, medicaments and biological substances: Secondary | ICD-10-CM

## 2015-01-07 DIAGNOSIS — Z8639 Personal history of other endocrine, nutritional and metabolic disease: Secondary | ICD-10-CM

## 2015-01-07 DIAGNOSIS — I10 Essential (primary) hypertension: Secondary | ICD-10-CM

## 2015-01-07 DIAGNOSIS — E282 Polycystic ovarian syndrome: Secondary | ICD-10-CM

## 2015-01-08 LAB — CREATININE, URINE, 24 HOUR
CREATININE, URINE: 123.2 mg/dL
Creatinine, 24H Ur: 1848 mg/d — ABNORMAL HIGH (ref 700–1800)

## 2015-01-10 LAB — METANEPHRINES, PLASMA
METANEPHRINE FREE: 45 pg/mL (ref <=57)
Normetanephrine, Free: 47 pg/mL (ref <=148)
Total Metanephrines-Plasma: 92 pg/mL (ref <=205)

## 2015-01-11 LAB — CATECHOLAMINES, FRACTIONATED, URINE, 24 HOUR
Calculated Total (E+NE): 55 mcg/24 h (ref 26–121)
Creatinine, Urine mg/day-CATEUR: 1.7 g/(24.h) (ref 0.63–2.50)
Dopamine, 24 hr Urine: 394 mcg/24 h (ref 52–480)
Norepinephrine, 24 hr Ur: 55 mcg/24 h (ref 15–100)
TOTAL VOLUME - CF 24HR U: 1500 mL

## 2015-01-13 LAB — METANEPHRINES, URINE, 24 HOUR
METANEPH TOTAL UR: 567 ug/(24.h) (ref 115–695)
Metanephrines, Ur: 280 mcg/24 h — ABNORMAL HIGH (ref 36–190)
Normetanephrine, 24H Ur: 287 mcg/24 h (ref 35–482)

## 2015-01-14 ENCOUNTER — Ambulatory Visit (INDEPENDENT_AMBULATORY_CARE_PROVIDER_SITE_OTHER): Payer: Federal, State, Local not specified - PPO | Admitting: Internal Medicine

## 2015-01-14 ENCOUNTER — Encounter: Payer: Self-pay | Admitting: Internal Medicine

## 2015-01-14 VITALS — BP 112/60 | HR 94 | Temp 98.0°F | Resp 12 | Wt 209.6 lb

## 2015-01-14 DIAGNOSIS — Z8639 Personal history of other endocrine, nutritional and metabolic disease: Secondary | ICD-10-CM | POA: Diagnosis not present

## 2015-01-14 DIAGNOSIS — R825 Elevated urine levels of drugs, medicaments and biological substances: Secondary | ICD-10-CM | POA: Insufficient documentation

## 2015-01-14 MED ORDER — METFORMIN HCL 500 MG PO TABS: 500.0000 mg | ORAL_TABLET | Freq: Two times a day (BID) | ORAL | Status: DC

## 2015-01-14 NOTE — Progress Notes (Signed)
Patient ID: Debbie Walter, female   DOB: 05/09/1981, 34 y.o.   MRN: [id number]    HPI  Debbie Walter is a 34 y.o.-year-old female, initially referred by her PCP, Dr. Elijio Miles, for management of elevated metanephrines, hypercalcemia, elevated HbA1c. Last visit 3 mo ago.  Reviewed and addended hx:  Pt. has been dx with hypertension dx at 34 (2003), had preeclampsia with her last pregnancy (has a 28 mo old). She also has a 3.5 mo old - was on Alpha methyl-dopa >> BP controlled then. This med did not help for the second pregnancy >> was on Metoprolol and Verapamil. Now off Verapamil, taking: - Metoprolol 50 mg bid - has been on this for years, increased 1 year ago - Valsartan 160 mg daily - Chlorthalidone 25 mg daily She was on Amlodipine 5 mg daily >> dropped BP too low and developed dizziness.  During the investigation for hypertension, she had a 24-hour urine fractionated metanephrine level checked in 07/20/2014 and the metanephrines returned slightly elevated as shown below. Patient mentions that she was still drinking caffeinated drinks and wine cooler during the period of the collection: Total volume 2.6 L Metanephrine 233 g per 24 hour (36-190) Normetanephrine 382 (35-482) Total metanephrines 614 (829-562).  Of note, at that time, she had the PRA 0.44 (0.25-5.8) with a potassium of 4.1. The test was run at [facility].  At the last visit with PCP, she had a repeat 24-hour urine for fractionated metanephrines, which again showed a slightly elevated metanephrine, practically unchanged from prior, despite being off caffeine and alcohol (not on Buspar yet):  Total volume 1.9 L Metanephrine 238 g per 24 hour (36-190) Normetanephrine 282 (35-482) Total metanephrines 520 (130-865).   Component     Latest Ref Rng 12/02/2014  Sodium     135 - 145 mEq/L 139  Potassium     3.5 - 5.1 mEq/L 3.2 (L)  Chloride     96 - 112 mEq/L 103  CO2     19 - 32 mEq/L 28  Glucose     70 - 99 mg/dL  111 (H)  BUN     6 - 23 mg/dL 15  Creatinine     0.40 - 1.20 mg/dL 0.77  Calcium     8.4 - 10.5 mg/dL 10.3  GFR     >60.00 mL/min 109.36  TSH     0.450 - 4.500 uIU/mL 1.960  Free T4     0.82 - 1.77 ng/dL 1.12  PTH     15 - 65 pg/mL 57  VITD     30.00 - 100.00 ng/mL 39.36  Hemoglobin A1C     4.6 - 6.5 % 6.1  Metformin increased to 500 mg 2x a day.  12/15/2014: Received labs from [facility]: Calcium 10.9 (8.4-10.5) - took Tums before the test!, magnesium 1.8 (1.5-2.5), BUN/creatinine 10/0.63. Sodium 141, potassium 3.6.  Recent labs - off Buspar for close to a month. She has been taking Tylenol but unclear if she took it during the collection. She tried Paxil, but dizziness >> stopped. Rx'ed Belviq but did not take it. Component     Latest Ref Rng 01/07/2015  Total Volume - CF 24Hr U      1500  Epinephrine, 24 hr Urine      REPORT  Norepinephrine, 24 hr Ur     15 - 100 mcg/24 h 55  Calculated Total (E+NE)     26 - 121 mcg/24 h 55  Dopamine, 24 hr  Urine     52 - 480 mcg/24 h 394  Creatinine, Urine mg/day-CATEUR     0.63 - 2.50 g/24 h 1.70  Metanephrine, Free     <=57 pg/mL 45  Normetanephrine, Free     <=148 pg/mL 47  Total Metanephrines-Plasma     <=205 pg/mL 92  Metanephrines, Ur     36 - 190 mcg/24 h 280 (H)  Normetanephrine, 24H Ur     35 - 482 mcg/24 h 287  Metaneph Total, Ur     115 - 695 mcg/24 h 567  Creatinine, Urine      123.2  Creatinine, 24H Ur     700 - 1800 mg/day 1848 (H)   Pt describes: - + fatigue - + Anxiety, especially after she found out that she might have an adrenal tumor, now better  She  - + palpitations - no HA - rarely only, not associated with attacks - no CP - no SOB - + weight gain  Patient describes that she has a history of R parathyroidectomy at 34 y/o. She had HTN then. She saw endo then >> Dr Windell Norfolk >> had issues with low calcium then >> anxiety >> started to take calcium. She is on calcium 600-400 mg-U every other day  (switched after calcium 10.7 on 07/16/2014). Calcium was 11 on 09/03/2014. She had a kidney stone and had lithotripsy in 2014.  Pt denies feeling nodules in neck, hoarseness, dysphagia/odynophagia, SOB with lying down.  No FH of thyroid cancer. FH with HTN: M,F,S. No pituitary tumor FH.  ROS: Constitutional: + weight gain, + fatigue, no subjective hyperthermia/hypothermia, + poor sleep Eyes: no blurry vision, no xerophthalmia ENT: no sore throat, no nodules palpated in throat, no dysphagia/odynophagia, no hoarseness Cardiovascular: no CP/SOB/+ palpitations/no leg swelling Respiratory: no cough/SOB Gastrointestinal: no N/V/D/C Musculoskeletal: no muscle/joint aches Skin: no rashes Neurological: no tremors/numbness/tingling/dizziness Psychiatric: no depression/+ anxiety  I reviewed pt's medications, allergies, PMH, social hx, family hx, and changes were documented in the history of present illness. Otherwise, unchanged from my initial visit note:  Past Medical History  Diagnosis Date  . Panic attacks   . Hypertension   . Fibroids   . PCOS (polycystic ovarian syndrome)     takes metformin for this  . Kidney stones 5/14  . GERD (gastroesophageal reflux disease)     takes protonix   Past Surgical History  Procedure Laterality Date  . Parathyroidectomy    . Cesarean section    . Parathyroidectomy    . Hysteroscopy    . Cesarean section with bilateral tubal ligation Bilateral 05/15/2014    Procedure: CESAREAN SECTION WITH BILATERAL TUBAL LIGATION;  Surgeon: Cyril Mourning, MD;  Location: [facility];  Service: Obstetrics;  Laterality: Bilateral;  repeat  edc 06/10/14   History   Social History  . Marital Status: Married    Spouse Name: N/A    Number of Children: 2 (4 miscarriages)   Occupational History  . Nurse    Social History Main Topics  . Smoking status: Never Smoker   . Smokeless tobacco: Never Used  . Alcohol Use: No  . Drug Use: No  . Sexual Activity: Yes    Current Outpatient Prescriptions on File Prior to Visit  Medication Sig Dispense Refill  . ALPRAZolam (XANAX) 0.25 MG tablet Take 0.25 mg by mouth 2 (two) times daily as needed.  0  . busPIRone (BUSPAR) 5 MG tablet Take 5 mg by mouth 2 (two) times daily.  1  .  Calcium Carb-Cholecalciferol (CALCIUM + D3) 600-200 MG-UNIT TABS Take 1 capsule by mouth daily.    . chlorthalidone (HYGROTON) 25 MG tablet Take 25 mg by mouth daily.  2  . cholecalciferol (VITAMIN D) 1000 UNITS tablet Take 1,000 Units by mouth every other day.    Marland Kitchen Fe Cbn-Fe Gluc-FA-B12-C-DSS (FERRALET 90 PO) Take 1 tablet by mouth.    Marland Kitchen ibuprofen (ADVIL,MOTRIN) 600 MG tablet Take 1 tablet (600 mg total) by mouth every 6 (six) hours. (Patient not taking: Reported on 10/15/2014) 30 tablet 0  . metFORMIN (GLUMETZA) 500 MG (MOD) 24 hr tablet Take 500 mg by mouth daily with breakfast.    . metFORMIN (GLUMETZA) 500 MG (MOD) 24 hr tablet Take 1 tablet (500 mg total) by mouth daily with breakfast. 60 tablet 0  . metoprolol (LOPRESSOR) 50 MG tablet Take 1 tablet (50 mg total) by mouth 2 (two) times daily. 60 tablet 3  . oxyCODONE-acetaminophen (PERCOCET/ROXICET) 5-325 MG per tablet Take 1-2 tablets by mouth every 4 (four) hours as needed for severe pain (moderate - severe pain). (Patient not taking: Reported on 10/15/2014) 60 tablet 0  . pantoprazole (PROTONIX) 40 MG tablet Take 40 mg by mouth daily.  2  . Prenatal Vit-Fe Fumarate-FA (PRENATAL MULTIVITAMIN) TABS tablet Take 1 tablet by mouth daily at 12 noon.    . valsartan (DIOVAN) 160 MG tablet   2  . verapamil (CALAN-SR) 180 MG CR tablet Take 1 tablet (180 mg total) by mouth 2 (two) times daily. (Patient not taking: Reported on 10/15/2014) 60 tablet 3   No current facility-administered medications on file prior to visit.   Allergies  Allergen Reactions  . Procardia [Nifedipine] Other (See Comments)    headache  . Wellbutrin [Bupropion] Other (See Comments)    "Makes my blood pressure go  up - I don't ever want to take it again"  . Labetalol Other (See Comments)    "makes me feel like things are crawling through my head"  . Zithromax [Azithromycin] Other (See Comments)    Gi upset.  Diarrhea   Family History  Problem Relation Age of Onset  . Anesthesia problems Neg Hx   . Hypotension Neg Hx   . Malignant hyperthermia Neg Hx   . Pseudochol deficiency Neg Hx     diabetes in mother  Hypertension in mother and father  Acute MI in mother  Rectal cancer in father  PE: BP 112/60 mmHg  Pulse 94  Temp(Src) 98 F (36.7 C) (Oral)  Resp 12  Wt 209 lb 9.6 oz (95.074 kg)  SpO2 97% Wt Readings from Last 3 Encounters:  01/14/15 209 lb 9.6 oz (95.074 kg)  10/15/14 202 lb (91.627 kg)  07/06/14 197 lb 4.8 oz (89.495 kg)   Constitutional: obese, [place] fat pads not enlarged  in NAD Eyes: PERRLA, EOMI, no exophthalmos ENT: moist mucous membranes, no thyromegaly, no cervical lymphadenopathy Cardiovascular: RRR, No MRG Respiratory: CTA B Gastrointestinal: abdomen soft, NT, ND, BS+ Musculoskeletal: no deformities, strength intact in all 4 Skin: moist, warm, no rashes Neurological: no tremor with outstretched hands, DTR normal in all 4  ASSESSMENT: 1. Elevated urinary metanephrines  - Mild  2. Hypercalcemia - History of right parathyroidectomy for primary hyperparathyroidism  Component     Latest Ref Rng 12/02/2014          Sodium     135 - 145 mEq/L 139  Potassium     3.5 - 5.1 mEq/L 3.2 (L)  Chloride  96 - 112 mEq/L 103  CO2     19 - 32 mEq/L 28  Glucose     70 - 99 mg/dL 111 (H)  BUN     6 - 23 mg/dL 15  Creatinine     0.40 - 1.20 mg/dL 0.77  Calcium     8.4 - 10.5 mg/dL 10.3  GFR     >60.00 mL/min 109.36  WBC     4.0 - 10.5 K/uL 9.8  RBC     3.87 - 5.11 Mil/uL 4.92  Hemoglobin     12.0 - 15.0 g/dL 13.6  HCT     36.0 - 46.0 % 40.0  MCV     78.0 - 100.0 fl 81.4  MCHC     30.0 - 36.0 g/dL 33.8  RDW     11.5 - 15.5 % 14.9  Platelets      150.0 - 400.0 K/uL 324.0  TSH     0.450 - 4.500 uIU/mL 1.960  Free T4     0.82 - 1.77 ng/dL 1.12  PTH     15 - 65 pg/mL 57  VITD     30.00 - 100.00 ng/mL 39.36  Hemoglobin A1C     4.6 - 6.5 % 6.1    PLAN:  1. Mildly elevated urinary metanephrines  - Patient with history of hypertension since she was 34 years old, and recent preeclampsia with her last pregnancy.  Urine metanephrines checked after birth returned slightly elevated and this was repeated twice. The last check, her plasma metanephrines were normal however, the urine metanephrines were elevated. She is now off BuSpar. No apparent offending agents taken during the urine collection. - We again had a long discussion about the fact that her urinary metanephrines are very slightly elevated, and this is a not uncommon in patients with essential hypertension, during panic attacks, or if taking certain medicines, coffee, smoking, alcohol. To be clinically significant, the elevation should be at least 2-3 times higher than the upper limit of normal, most frequently approximately 4 times.  - The one thing that it's concerning is that she has a history of parathyroidectomy for primary hyperparathyroidism while very young might 19. She does not have family history of hypercalcemia, kidney stones, thyroid cancer, renal cell cancer, pheochromocytoma, to point towards an MEN2A syndrome, but this can happen spontaneously. Because of her previous history of hyperparathyroidism, I would have a lower threshold to investigate her further for pheochromocytoma if the tests remain elevated. - we decided to repeat her urinary metanephrines along with plasma metanephrines in 6 months. If they're still elevated, we discussed about obtaining a CT scan with contrast to further investigate the adrenals. We may possibly need an MIBG scan, also. - We reviewed her previous abdominal CT scan reports (both done without contrast): From 04/02/2008 and 02/10/2013. Both  report normal adrenals and no suspicious abdominal masses.   2. Hypercalcemia - Please see above - She has a history of primary hyperparathyroidism at 26 and she is now status post R parathyroidectomy. Her recent calcium level has been high, at 10.9, but she mentioned that she took Tums before the lab. A month ago, her vitamin D, calcium and iPTH were normal. We will recheck them at next visit.   3. PCOS - Patient continues on metformin, the dose was increased since last visit - Will repeat a hemoglobin A1c at next visit  - time spent with the patient and her husband: 40 min, of which >50% was spent  in obtaining information about her symptoms, reviewing together her previous labs, evaluations, counseling her about her condition (please see the discussed topics above), and developing a plan to further investigate it. She had a number of questions which I addressed.

## 2015-01-14 NOTE — Patient Instructions (Signed)
Please come back for a follow-up appointment in 6 months.

## 2015-02-10 ENCOUNTER — Ambulatory Visit (INDEPENDENT_AMBULATORY_CARE_PROVIDER_SITE_OTHER): Payer: Federal, State, Local not specified - PPO | Admitting: Internal Medicine

## 2015-02-10 ENCOUNTER — Encounter: Payer: Self-pay | Admitting: Internal Medicine

## 2015-02-10 VITALS — BP 134/86 | HR 82 | Temp 98.4°F | Wt 208.0 lb

## 2015-02-10 DIAGNOSIS — F411 Generalized anxiety disorder: Secondary | ICD-10-CM | POA: Insufficient documentation

## 2015-02-10 DIAGNOSIS — K219 Gastro-esophageal reflux disease without esophagitis: Secondary | ICD-10-CM

## 2015-02-10 DIAGNOSIS — F32A Depression, unspecified: Secondary | ICD-10-CM | POA: Insufficient documentation

## 2015-02-10 DIAGNOSIS — E282 Polycystic ovarian syndrome: Secondary | ICD-10-CM | POA: Diagnosis not present

## 2015-02-10 DIAGNOSIS — F419 Anxiety disorder, unspecified: Secondary | ICD-10-CM

## 2015-02-10 DIAGNOSIS — F329 Major depressive disorder, single episode, unspecified: Secondary | ICD-10-CM | POA: Insufficient documentation

## 2015-02-10 DIAGNOSIS — Z8639 Personal history of other endocrine, nutritional and metabolic disease: Secondary | ICD-10-CM

## 2015-02-10 DIAGNOSIS — I10 Essential (primary) hypertension: Secondary | ICD-10-CM | POA: Diagnosis not present

## 2015-02-10 DIAGNOSIS — F410 Panic disorder [episodic paroxysmal anxiety] without agoraphobia: Secondary | ICD-10-CM | POA: Insufficient documentation

## 2015-02-10 NOTE — Assessment & Plan Note (Signed)
Will continue Xanax for now If anxiety worsens, she prefers to restart Buspar

## 2015-02-10 NOTE — Assessment & Plan Note (Signed)
Continue Metformin Continue to follow with endocrinology

## 2015-02-10 NOTE — Assessment & Plan Note (Signed)
Will try cutting her chlorthalidone in half Will continue Valsartan and Metoprolol at current dose  RTC in 2 weeks the check BP, will obtain CMET at that time

## 2015-02-10 NOTE — Assessment & Plan Note (Signed)
Continue Protonix daily and Pepcid as needed Discussed how weight los could improve her reflux

## 2015-02-10 NOTE — Patient Instructions (Signed)

## 2015-02-10 NOTE — Progress Notes (Signed)
HPI  Pt presents to the clinic today to establish care and for management of the conditions listed below. She is transferring care from [facility]. Her last annual exam was 06/2014.  HTN: Her BP is controlled on Valsartan, Chlorthalidone and Lopressor. She is also taking a Potassium supplement daily. She reports that her BP has been dropping below 110/70. She does feel dizzy at times. She would like to try cutting the Chlorthalidone in half.  Hyperparathyroidism: She has had a parathyroidectomy. She follows with endocrinology and they routinely check her urine/serum catecholamines.  PCOS: She has had this for many years. She takes Metformin daily and is seeing an endocrinologist.  Anxiety: She reports she has anxiety on daily basis. She has tried Buspar in the past which helped but did experience numbness in her hands. Her previous PCP switched her to Paxil but she reports that made her dizzy so she stopped taking it. She does have panic attacks, which only occur about 2 x a years. She does have occasional insomnia secondary to her anxiety. She takes Xanax about 1 x month.   GERD: She takes Protonix daily. Over the last 2-3 weeks she reports she has had some increased epigastric pain and occasional loose stool. She denies blood in her stool. She did take Pepcid OTC with good relief.  Past Medical History  Diagnosis Date  . Panic attacks   . Hypertension   . Fibroids   . PCOS (polycystic ovarian syndrome)     takes metformin for this  . Kidney stones 5/14  . GERD (gastroesophageal reflux disease)     takes protonix    Current Outpatient Prescriptions  Medication Sig Dispense Refill  . ALPRAZolam (XANAX) 0.25 MG tablet Take 0.25 mg by mouth as needed.   0  . chlorthalidone (HYGROTON) 25 MG tablet Take 25 mg by mouth daily.  2  . cholecalciferol (VITAMIN D) 1000 UNITS tablet Take 1,000 Units by mouth every other day.    . famotidine (PEPCID) 20 MG tablet Take 20 mg by  mouth as needed for heartburn or indigestion.    Marland Kitchen ibuprofen (ADVIL,MOTRIN) 600 MG tablet Take 1 tablet (600 mg total) by mouth every 6 (six) hours. 30 tablet 0  . KLOR-CON M20 20 MEQ tablet Take 20 mEq by mouth daily.  1  . metFORMIN (GLUCOPHAGE) 500 MG tablet Take 1 tablet (500 mg total) by mouth 2 (two) times daily with a meal. 180 tablet 3  . metoprolol (LOPRESSOR) 50 MG tablet Take 1 tablet (50 mg total) by mouth 2 (two) times daily. 60 tablet 3  . pantoprazole (PROTONIX) 40 MG tablet Take 40 mg by mouth daily.  2  . Prenat w/o A-FeCbGl-DSS-FA-DHA (CITRANATAL ASSURE) 35-1 & 300 MG tablet Take 1 tablet by mouth daily.  11  . valsartan (DIOVAN) 80 MG tablet Take 80 mg by mouth daily.     No current facility-administered medications for this visit.    Allergies  Allergen Reactions  . Procardia [Nifedipine] Other (See Comments)    headache  . Wellbutrin [Bupropion] Other (See Comments)    "Makes my blood pressure go up - I don't ever want to take it again"  . Labetalol Other (See Comments)    "makes me feel like things are crawling through my head"  . Zithromax [Azithromycin] Other (See Comments)    Gi upset.  Diarrhea    Family History  Problem Relation Age of Onset  . Anesthesia problems Neg Hx   . Hypotension  Neg Hx   . Malignant hyperthermia Neg Hx   . Pseudochol deficiency Neg Hx   . Hyperlipidemia Mother   . Heart disease Mother   . Diabetes Mother   . Hypertension Mother   . Hyperlipidemia Father   . Hypertension Father   . Hypertension Sister     History   Social History  . Marital Status: Married    Spouse Name: N/A  . Number of Children: N/A  . Years of Education: N/A   Occupational History  . Not on file.   Social History Main Topics  . Smoking status: Never Smoker   . Smokeless tobacco: Never Used  . Alcohol Use: No  . Drug Use: No  . Sexual Activity: Yes    Birth Control/ Protection: None   Other Topics Concern  . Not on file   Social History  Narrative    ROS:  Constitutional: Denies fever, malaise, fatigue, headache or abrupt weight changes.  Respiratory: Denies difficulty breathing, shortness of breath, cough or sputum production.   Cardiovascular: Denies chest pain, chest tightness, palpitations or swelling in the hands or feet.  Gastrointestinal: Pt reports reflux. Denies abdominal pain, bloating, constipation, diarrhea or blood in the stool.  Musculoskeletal: Denies decrease in range of motion, difficulty with gait, muscle pain or joint pain and swelling.  Skin: Denies redness, rashes, lesions or ulcercations.  Neurological: Denies dizziness, difficulty with memory, difficulty with speech or problems with balance and coordination.  Psych: Pt reports anxiety. Denies depression, SI/HI.  No other specific complaints in a complete review of systems (except as listed in HPI above).  PE:  BP 134/86 mmHg  Pulse 82  Temp(Src) 98.4 F (36.9 C) (Oral)  Wt 208 lb (94.348 kg)  SpO2 98%  LMP 02/07/2015  Breastfeeding? No Wt Readings from Last 3 Encounters:  02/10/15 208 lb (94.348 kg)  01/14/15 209 lb 9.6 oz (95.074 kg)  10/15/14 202 lb (91.627 kg)    General: Appears her stated age, obese in NAD. Skin: Warm, dry and intact. Neck: Neck supple, trachea midline. No masses, lumps or thyromegaly present.  Cardiovascular: Normal rate and rhythm. S1,S2 noted.  No murmur, rubs or gallops noted.  Pulmonary/Chest: Normal effort and positive vesicular breath sounds. No respiratory distress. No wheezes, rales or ronchi noted.  Abdomen: Soft and nontender. Normal bowel sounds, no bruits noted. No distention or masses noted. Liver, spleen and kidneys non palpable. Neurological: Alert and oriented.  Psychiatric: Mood and affect normal. Behavior is normal. Judgment and thought content normal.     BMET    Component Value Date/Time   NA 139 12/02/2014 0757   K 3.2* 12/02/2014 0757   CL 103 12/02/2014 0757   CO2 28 12/02/2014 0757    GLUCOSE 111* 12/02/2014 0757   BUN 15 12/02/2014 0757   CREATININE 0.77 12/02/2014 0757   CREATININE 0.47* 04/28/2014 2340   CALCIUM 10.3 12/02/2014 0757   GFRNONAA >90 04/29/2014 0513   GFRAA >90 04/29/2014 0513    Lipid Panel  No results found for: CHOL, TRIG, HDL, CHOLHDL, VLDL, LDLCALC  CBC    Component Value Date/Time   WBC 9.8 12/02/2014 0833   RBC 4.92 12/02/2014 0833   HGB 13.6 12/02/2014 0833   HCT 40.0 12/02/2014 0833   PLT 324.0 12/02/2014 0833   MCV 81.4 12/02/2014 0833   MCH 27.3 05/16/2014 0548   MCHC 33.8 12/02/2014 0833   RDW 14.9 12/02/2014 0833   LYMPHSABS 1.5 02/10/2013 0954   MONOABS 0.5 02/10/2013  0954   EOSABS 0.1 02/10/2013 0954   BASOSABS 0.0 02/10/2013 0954    Hgb A1C Lab Results  Component Value Date   HGBA1C 6.1 12/02/2014     Assessment and Plan:

## 2015-02-10 NOTE — Progress Notes (Signed)
Pre visit review using our clinic review tool, if applicable. No additional management support is needed unless otherwise documented below in the visit note. 

## 2015-02-10 NOTE — Assessment & Plan Note (Signed)
S/p parathyroidectomy Continue to follow with endocrinology

## 2015-02-16 ENCOUNTER — Other Ambulatory Visit: Payer: Self-pay

## 2015-02-16 DIAGNOSIS — E282 Polycystic ovarian syndrome: Secondary | ICD-10-CM

## 2015-02-16 DIAGNOSIS — Z8639 Personal history of other endocrine, nutritional and metabolic disease: Secondary | ICD-10-CM

## 2015-02-16 DIAGNOSIS — I10 Essential (primary) hypertension: Secondary | ICD-10-CM

## 2015-02-16 DIAGNOSIS — R825 Elevated urine levels of drugs, medicaments and biological substances: Secondary | ICD-10-CM

## 2015-02-16 MED ORDER — PANTOPRAZOLE SODIUM 40 MG PO TBEC: 40.0000 mg | DELAYED_RELEASE_TABLET | Freq: Every day | ORAL | Status: DC

## 2015-02-16 NOTE — Telephone Encounter (Signed)
Pt left v/m requesting refill pantoprazole. Pt seen 02/10/15. Refilled # 30 x 6. Left detailed v/m for pt refill done.

## 2015-02-25 ENCOUNTER — Encounter (HOSPITAL_COMMUNITY): Payer: Self-pay | Admitting: Obstetrics and Gynecology

## 2015-02-26 ENCOUNTER — Encounter: Payer: Self-pay | Admitting: Internal Medicine

## 2015-02-26 ENCOUNTER — Ambulatory Visit (INDEPENDENT_AMBULATORY_CARE_PROVIDER_SITE_OTHER): Payer: Federal, State, Local not specified - PPO | Admitting: Internal Medicine

## 2015-02-26 DIAGNOSIS — I10 Essential (primary) hypertension: Secondary | ICD-10-CM

## 2015-02-26 MED ORDER — METOPROLOL TARTRATE 50 MG PO TABS: 50.0000 mg | ORAL_TABLET | Freq: Two times a day (BID) | ORAL | Status: DC

## 2015-02-26 MED ORDER — VALSARTAN 80 MG PO TABS: 80.0000 mg | ORAL_TABLET | Freq: Every day | ORAL | Status: DC

## 2015-02-26 MED ORDER — CHLORTHALIDONE 25 MG PO TABS: 25.0000 mg | ORAL_TABLET | Freq: Every day | ORAL | Status: DC

## 2015-02-26 MED ORDER — PANTOPRAZOLE SODIUM 40 MG PO TBEC: 40.0000 mg | DELAYED_RELEASE_TABLET | Freq: Every day | ORAL | Status: DC

## 2015-02-26 NOTE — Progress Notes (Signed)
Pre visit review using our clinic review tool, if applicable. No additional management support is needed unless otherwise documented below in the visit note. 

## 2015-02-26 NOTE — Patient Instructions (Signed)

## 2015-02-26 NOTE — Progress Notes (Addendum)
Subjective:    Patient ID: Debbie Walter, female    DOB: 1981-04-02, 34 y.o.   MRN: [id number]  HPI  Pt presents to the clinic today for 2 week follow up of HTN. At her last visit, she wanted to try to cut her Chlorthalidone in half. She would continue her Valsartan and Metoprolol. She has been monitoring her BP at home. It has been running around 145/87. She has had a slight headache. She denies chest pain or shortness of breath. Her BP today is 136/78. She will need a refill medication today.  Review of Systems      Past Medical History  Diagnosis Date  . Panic attacks   . Hypertension   . Fibroids   . PCOS (polycystic ovarian syndrome)     takes metformin for this  . Kidney stones 5/14  . GERD (gastroesophageal reflux disease)     takes protonix    Current Outpatient Prescriptions  Medication Sig Dispense Refill  . ALPRAZolam (XANAX) 0.25 MG tablet Take 0.25 mg by mouth as needed.   0  . chlorthalidone (HYGROTON) 25 MG tablet Take 25 mg by mouth daily.  2  . cholecalciferol (VITAMIN D) 1000 UNITS tablet Take 1,000 Units by mouth every other day.    . famotidine (PEPCID) 20 MG tablet Take 20 mg by mouth as needed for heartburn or indigestion.    Marland Kitchen ibuprofen (ADVIL,MOTRIN) 600 MG tablet Take 1 tablet (600 mg total) by mouth every 6 (six) hours. 30 tablet 0  . KLOR-CON M20 20 MEQ tablet Take 20 mEq by mouth daily.  1  . metFORMIN (GLUCOPHAGE) 500 MG tablet Take 1 tablet (500 mg total) by mouth 2 (two) times daily with a meal. 180 tablet 3  . metoprolol (LOPRESSOR) 50 MG tablet Take 1 tablet (50 mg total) by mouth 2 (two) times daily. 60 tablet 3  . pantoprazole (PROTONIX) 40 MG tablet Take 1 tablet (40 mg total) by mouth daily. 30 tablet 5  . Prenat w/o A-FeCbGl-DSS-FA-DHA (CITRANATAL ASSURE) 35-1 & 300 MG tablet Take 1 tablet by mouth daily.  11  . valsartan (DIOVAN) 80 MG tablet Take 80 mg by mouth daily.     No current facility-administered medications for this visit.     Allergies  Allergen Reactions  . Procardia [Nifedipine] Other (See Comments)    headache  . Wellbutrin [Bupropion] Other (See Comments)    "Makes my blood pressure go up - I don't ever want to take it again"  . Labetalol Other (See Comments)    "makes me feel like things are crawling through my head"  . Zithromax [Azithromycin] Other (See Comments)    Gi upset.  Diarrhea    Family History  Problem Relation Age of Onset  . Anesthesia problems Neg Hx   . Hypotension Neg Hx   . Malignant hyperthermia Neg Hx   . Pseudochol deficiency Neg Hx   . Hyperlipidemia Mother   . Heart disease Mother   . Diabetes Mother   . Hypertension Mother   . Hyperlipidemia Father   . Hypertension Father   . Cancer Father     rectal  . Hypertension Sister     History   Social History  . Marital Status: Married    Spouse Name: N/A  . Number of Children: N/A  . Years of Education: N/A   Occupational History  . Not on file.   Social History Main Topics  . Smoking status: Never Smoker   .  Smokeless tobacco: Never Used  . Alcohol Use: No  . Drug Use: No  . Sexual Activity: Yes    Birth Control/ Protection: None   Other Topics Concern  . Not on file   Social History Narrative     Constitutional: Pt reports headache. Denies fever, malaise, fatigue, or abrupt weight changes.  HEENT: Denies eye pain, eye redness, ear pain, ringing in the ears, wax buildup, runny nose, nasal congestion, bloody nose, or sore throat. Respiratory: Denies difficulty breathing, shortness of breath, cough or sputum production.   Cardiovascular: Denies chest pain, chest tightness, palpitations or swelling in the hands or feet.  Neurological: Denies dizziness, difficulty with memory, difficulty with speech or problems with balance and coordination.   No other specific complaints in a complete review of systems (except as listed in HPI above).  Objective:   Physical Exam   BP 136/78 mmHg  Pulse 74   Temp(Src) 98.1 F (36.7 C) (Oral)  Wt 209 lb (94.802 kg)  SpO2 97%  LMP 02/07/2015 Wt Readings from Last 3 Encounters:  02/26/15 209 lb (94.802 kg)  02/10/15 208 lb (94.348 kg)  01/14/15 209 lb 9.6 oz (95.074 kg)    General: Appears her stated age, obese in NAD. HEENT: Head: normal shape and size; Eyes: sclera white, no icterus, conjunctiva pink, PERRLA and EOMs intact;  Cardiovascular: Normal rate and rhythm. S1,S2 noted.  No murmur, rubs or gallops noted.  Pulmonary/Chest: Normal effort and positive vesicular breath sounds. No respiratory distress. No wheezes, rales or ronchi noted.   Neurological: Alert and oriented.    BMET    Component Value Date/Time   NA 139 12/02/2014 0757   K 3.2* 12/02/2014 0757   CL 103 12/02/2014 0757   CO2 28 12/02/2014 0757   GLUCOSE 111* 12/02/2014 0757   BUN 15 12/02/2014 0757   CREATININE 0.77 12/02/2014 0757   CREATININE 0.47* 04/28/2014 2340   CALCIUM 10.3 12/02/2014 0757   GFRNONAA >90 04/29/2014 0513   GFRAA >90 04/29/2014 0513    Lipid Panel  No results found for: CHOL, TRIG, HDL, CHOLHDL, VLDL, LDLCALC  CBC    Component Value Date/Time   WBC 9.8 12/02/2014 0833   RBC 4.92 12/02/2014 0833   HGB 13.6 12/02/2014 0833   HCT 40.0 12/02/2014 0833   PLT 324.0 12/02/2014 0833   MCV 81.4 12/02/2014 0833   MCH 27.3 05/16/2014 0548   MCHC 33.8 12/02/2014 0833   RDW 14.9 12/02/2014 0833   LYMPHSABS 1.5 02/10/2013 0954   MONOABS 0.5 02/10/2013 0954   EOSABS 0.1 02/10/2013 0954   BASOSABS 0.0 02/10/2013 0954    Hgb A1C Lab Results  Component Value Date   HGBA1C 6.1 12/02/2014        Assessment & Plan:

## 2015-02-26 NOTE — Assessment & Plan Note (Signed)
BP is fine today She has been having headaches daily Will check BMET today She will go back up on her Chlorthalidone Continue Valsartan and Metoprolol ECG today

## 2015-02-26 NOTE — Addendum Note (Signed)
Addended by: [name] on: 02/26/2015 08:45 PM   Modules accepted: Miquel Dunn

## 2015-02-27 LAB — BASIC METABOLIC PANEL
BUN: 8 mg/dL (ref 6–23)
CALCIUM: 10.4 mg/dL (ref 8.4–10.5)
CO2: 27 mEq/L (ref 19–32)
Chloride: 103 mEq/L (ref 96–112)
Creat: 0.76 mg/dL (ref 0.50–1.10)
GLUCOSE: 91 mg/dL (ref 70–99)
POTASSIUM: 3.9 meq/L (ref 3.5–5.3)
Sodium: 141 mEq/L (ref 135–145)

## 2015-03-03 ENCOUNTER — Encounter: Payer: Self-pay | Admitting: Internal Medicine

## 2015-03-03 ENCOUNTER — Telehealth: Payer: Self-pay | Admitting: Internal Medicine

## 2015-03-03 ENCOUNTER — Ambulatory Visit (INDEPENDENT_AMBULATORY_CARE_PROVIDER_SITE_OTHER): Payer: Federal, State, Local not specified - PPO | Admitting: Internal Medicine

## 2015-03-03 VITALS — BP 136/88 | HR 91 | Temp 98.7°F | Wt 208.0 lb

## 2015-03-03 DIAGNOSIS — R630 Anorexia: Secondary | ICD-10-CM

## 2015-03-03 DIAGNOSIS — F411 Generalized anxiety disorder: Secondary | ICD-10-CM | POA: Diagnosis not present

## 2015-03-03 DIAGNOSIS — R5383 Other fatigue: Secondary | ICD-10-CM | POA: Diagnosis not present

## 2015-03-03 DIAGNOSIS — R519 Headache, unspecified: Secondary | ICD-10-CM

## 2015-03-03 DIAGNOSIS — R51 Headache: Secondary | ICD-10-CM | POA: Diagnosis not present

## 2015-03-03 MED ORDER — BUSPIRONE HCL 5 MG PO TABS: 5.0000 mg | ORAL_TABLET | Freq: Every day | ORAL | Status: DC

## 2015-03-03 NOTE — Telephone Encounter (Signed)
Patient Name: Debbie Walter DOB: 01-11-1981 Initial Comment Caller states she is not feeling well, tired and weak starting yesterday, head pressure, elevated bp Nurse Assessment Nurse: Vallery Sa, RN, [place] Date/Time (Eastern Time): 03/03/2015 1:17:39 PM Confirm and document reason for call. If symptomatic, describe symptoms. ---Caller states she developed a headache, weakness and elevated blood pressure yesterday (152/78 this morning). No fever. No injury in the past 3 days. Has the patient traveled out of the country within the last 30 days? ---No Does the patient require triage? ---Yes Related visit to physician within the last 2 weeks? ---No Does the PT have any chronic conditions? (i.e. diabetes, asthma, etc.) ---Yes List chronic conditions. ---High Blood Pressure, GERD, Pre-Diabetes Did the patient indicate they were pregnant? ---No Guidelines Guideline Title Affirmed Question Affirmed Notes High Blood Pressure [1] Taking BP medications AND [2] feels is having side effects (e.g., impotence, cough, dizzy upon standing) dizzy at times Final Disposition User See PCP When Office is Open (within 3 days) Vallery Sa, RN, [place] Comments Scheduled for 4:00pm appointment with Dr. Garnette Gunner today.

## 2015-03-03 NOTE — Telephone Encounter (Signed)
Pt has appt on 03/03/15 at 3:45 pm with Webb Silversmith NP.

## 2015-03-03 NOTE — Patient Instructions (Signed)
Generalized Anxiety Disorder Generalized anxiety disorder (GAD) is a mental disorder. It interferes with life functions, including relationships, work, and school. GAD is different from normal anxiety, which everyone experiences at some point in their lives in response to specific life events and activities. Normal anxiety actually helps us prepare for and get through these life events and activities. Normal anxiety goes away after the event or activity is over.  GAD causes anxiety that is not necessarily related to specific events or activities. It also causes excess anxiety in proportion to specific events or activities. The anxiety associated with GAD is also difficult to control. GAD can vary from mild to severe. People with severe GAD can have intense waves of anxiety with physical symptoms (panic attacks).  SYMPTOMS The anxiety and worry associated with GAD are difficult to control. This anxiety and worry are related to many life events and activities and also occur more days than not for 6 months or longer. People with GAD also have three or more of the following symptoms (one or more in children):  Restlessness.   Fatigue.  Difficulty concentrating.   Irritability.  Muscle tension.  Difficulty sleeping or unsatisfying sleep. DIAGNOSIS GAD is diagnosed through an assessment by your health care provider. Your health care provider will ask you questions aboutyour mood,physical symptoms, and events in your life. Your health care provider may ask you about your medical history and use of alcohol or drugs, including prescription medicines. Your health care provider may also do a physical exam and blood tests. Certain medical conditions and the use of certain substances can cause symptoms similar to those associated with GAD. Your health care provider may refer you to a mental health specialist for further evaluation. TREATMENT The following therapies are usually used to treat GAD:    Medication. Antidepressant medication usually is prescribed for long-term daily control. Antianxiety medicines may be added in severe cases, especially when panic attacks occur.   Talk therapy (psychotherapy). Certain types of talk therapy can be helpful in treating GAD by providing support, education, and guidance. A form of talk therapy called cognitive behavioral therapy can teach you healthy ways to think about and react to daily life events and activities.  Stress managementtechniques. These include yoga, meditation, and exercise and can be very helpful when they are practiced regularly. A mental health specialist can help determine which treatment is best for you. Some people see improvement with one therapy. However, other people require a combination of therapies. Document Released: 12/30/2012 Document Revised: 01/19/2014 Document Reviewed: 12/30/2012 [employer] Patient Information 2015 [employer]. This information is not intended to replace advice given to you by your health care provider. Make sure you discuss any questions you have with your health care provider.  

## 2015-03-03 NOTE — Progress Notes (Signed)
Pre visit review using our clinic review tool, if applicable. No additional management support is needed unless otherwise documented below in the visit note. 

## 2015-03-03 NOTE — Progress Notes (Signed)
Subjective:    Patient ID: Debbie Walter, female    DOB: 10/16/1980, 34 y.o.   MRN: [id number]  HPI  Pt presents to the clinic today headache, fatigue and loss of appetite. She reports this started 2 days ago. She reports this came on all of a sudden. She feels dehydrated and week. She has only eating a fruit cup and 2 slices of [place] bacon. She denies nausea, vomiting or diarrhea. She denies fever, chills, runny nose, sore throat, cough or shortness of breath. She is not sure what could have caused this. She has not had sick contacts. She does feel very stressed, anxious and on the verge of a panic attack. This could be causing all off her symptoms. She has a history of anxiety. It is severe. She reports she gets hospitlalized once a year because of her anxiety. She has trouble dealing with it. She has seen a therapist/psychiatrist in the past but does not feel like it was very helpful. She was on Buspar in the past but stopped it because it caused tingling in her hands. She misses work on a multiple basis secondary to her anxiety. She denies depression, SI/HI.  Review of Systems      Past Medical History  Diagnosis Date  . Panic attacks   . Hypertension   . Fibroids   . PCOS (polycystic ovarian syndrome)     takes metformin for this  . Kidney stones 5/14  . GERD (gastroesophageal reflux disease)     takes protonix    Current Outpatient Prescriptions  Medication Sig Dispense Refill  . ALPRAZolam (XANAX) 0.25 MG tablet Take 0.25 mg by mouth as needed.   0  . chlorthalidone (HYGROTON) 25 MG tablet Take 1 tablet (25 mg total) by mouth daily. 30 tablet 5  . cholecalciferol (VITAMIN D) 1000 UNITS tablet Take 1,000 Units by mouth every other day.    . famotidine (PEPCID) 20 MG tablet Take 20 mg by mouth as needed for heartburn or indigestion.    Marland Kitchen ibuprofen (ADVIL,MOTRIN) 600 MG tablet Take 1 tablet (600 mg total) by mouth every 6 (six) hours. 30 tablet 0  . KLOR-CON M20 20 MEQ tablet  Take 20 mEq by mouth daily.  1  . metFORMIN (GLUCOPHAGE) 500 MG tablet Take 1 tablet (500 mg total) by mouth 2 (two) times daily with a meal. 180 tablet 3  . metoprolol (LOPRESSOR) 50 MG tablet Take 1 tablet (50 mg total) by mouth 2 (two) times daily. 60 tablet 5  . pantoprazole (PROTONIX) 40 MG tablet Take 1 tablet (40 mg total) by mouth daily. 30 tablet 5  . Prenat w/o A-FeCbGl-DSS-FA-DHA (CITRANATAL ASSURE) 35-1 & 300 MG tablet Take 1 tablet by mouth daily.  11  . valsartan (DIOVAN) 80 MG tablet Take 1 tablet (80 mg total) by mouth daily. 30 tablet 5   No current facility-administered medications for this visit.    Allergies  Allergen Reactions  . Procardia [Nifedipine] Other (See Comments)    headache  . Wellbutrin [Bupropion] Other (See Comments)    "Makes my blood pressure go up - I don't ever want to take it again"  . Labetalol Other (See Comments)    "makes me feel like things are crawling through my head"  . Zithromax [Azithromycin] Other (See Comments)    Gi upset.  Diarrhea    Family History  Problem Relation Age of Onset  . Anesthesia problems Neg Hx   . Hypotension Neg Hx   .  Malignant hyperthermia Neg Hx   . Pseudochol deficiency Neg Hx   . Hyperlipidemia Mother   . Heart disease Mother   . Diabetes Mother   . Hypertension Mother   . Hyperlipidemia Father   . Hypertension Father   . Cancer Father     rectal  . Hypertension Sister     History   Social History  . Marital Status: Married    Spouse Name: N/A  . Number of Children: N/A  . Years of Education: N/A   Occupational History  . Not on file.   Social History Main Topics  . Smoking status: Never Smoker   . Smokeless tobacco: Never Used  . Alcohol Use: No  . Drug Use: No  . Sexual Activity: Yes    Birth Control/ Protection: None   Other Topics Concern  . Not on file   Social History Narrative     Constitutional: Pt reports fatigue and headache. Denies fever, malaise or abrupt weight  changes.  HEENT: Denies eye pain, eye redness, ear pain, ringing in the ears, wax buildup, runny nose, nasal congestion, bloody nose, or sore throat. Respiratory: Denies difficulty breathing, shortness of breath, cough or sputum production. Cardiovascular: Denies chest pain, chest tightness, palpitations or swelling in the hands or feet.  Gastrointestinal: Denies abdominal pain, bloating, constipation, diarrhea or blood in the stool.  GU: Denies urgency, frequency, pain with urination, burning sensation, blood in urine, odor or discharge. Psych: Pt reports anxiety. Denies depression, SI/HI.   No other specific complaints in a complete review of systems (except as listed in HPI above).  Objective:   Physical Exam   BP 136/88 mmHg  Pulse 91  Temp(Src) 98.7 F (37.1 C) (Oral)  Wt 208 lb (94.348 kg)  SpO2 98%  LMP 02/07/2015 Wt Readings from Last 3 Encounters:  03/03/15 208 lb (94.348 kg)  02/26/15 209 lb (94.802 kg)  02/10/15 208 lb (94.348 kg)    General: Appears her stated age, obese in NAD. HEENT: Head: normal shape and size; Eyes: sclera white, no icterus, conjunctiva pink, PERRLA and EOMs intact; Ears: Tm's gray and intact, normal light reflex; Nose: mucosa pink and moist, septum midline; Throat/Mouth: Teeth present, mucosa pink and moist, no exudate, lesions or ulcerations noted.  Neck: No adenopathy noted. Cardiovascular: Normal rate and rhythm. S1,S2 noted.   Pulmonary/Chest: Normal effort and positive vesicular breath sounds. No respiratory distress. No wheezes, rales or ronchi noted.  Neurological: Alert and oriented.  Psychiatric: She is tearful, crying and anxious today. She is making eye contact. Her judgment and thought content seem normal.  BMET    Component Value Date/Time   NA 141 02/26/2015 1654   K 3.9 02/26/2015 1654   CL 103 02/26/2015 1654   CO2 27 02/26/2015 1654   GLUCOSE 91 02/26/2015 1654   BUN 8 02/26/2015 1654   CREATININE 0.76 02/26/2015 1654    CREATININE 0.77 12/02/2014 0757   CREATININE 0.47* 04/28/2014 2340   CALCIUM 10.4 02/26/2015 1654   GFRNONAA >90 04/29/2014 0513   GFRAA >90 04/29/2014 0513    Lipid Panel  No results found for: CHOL, TRIG, HDL, CHOLHDL, VLDL, LDLCALC  CBC    Component Value Date/Time   WBC 9.8 12/02/2014 0833   RBC 4.92 12/02/2014 0833   HGB 13.6 12/02/2014 0833   HCT 40.0 12/02/2014 0833   PLT 324.0 12/02/2014 0833   MCV 81.4 12/02/2014 0833   MCH 27.3 05/16/2014 0548   MCHC 33.8 12/02/2014 0833   RDW  14.9 12/02/2014 0833   LYMPHSABS 1.5 02/10/2013 0954   MONOABS 0.5 02/10/2013 0954   EOSABS 0.1 02/10/2013 0954   BASOSABS 0.0 02/10/2013 0954    Hgb A1C Lab Results  Component Value Date   HGBA1C 6.1 12/02/2014        Assessment & Plan:   Headache, fatigue, loss of appetite:  I think this is r/t uncontrolled anxiety Discussed treatment options Will start Buspar at a very low dose Encouraged her to let me refer her to a psychiatrist, she declines at this time Support offered today Discussed deep breathing, distraction, and other relaxation techniques  RTC in 4 weeks to follow up anxiety

## 2015-04-27 ENCOUNTER — Telehealth: Payer: Self-pay | Admitting: Internal Medicine

## 2015-04-27 NOTE — Telephone Encounter (Signed)
Error appt was not for today--had to cancel we had no appts available for today and pt states she will just go to [facility]

## 2015-04-27 NOTE — Telephone Encounter (Signed)
Pt is having weakness in face and then she eats a banana or two and it goes away. This has happened a couple of times over the last few days. I offered to transfer to [facility] and pt declined. She thinks her potassium is low or her electrolytes are low. She would like to have labs without an appointment, I advised that an appointment would be the best option, but she declined.  She is requesting a call back on mobile number. Thanks

## 2015-04-27 NOTE — Telephone Encounter (Signed)
This is a new problem, she will need an appt

## 2015-04-27 NOTE — Telephone Encounter (Signed)
I looked back through all my notes. She made no mention of numbness in her face. She did mention numbness in her hands. If she is off work today, she can come in for an appt or she can [facility].

## 2015-04-27 NOTE — Telephone Encounter (Signed)
Pt has appt with Allie Bossier 3:30 today

## 2015-04-27 NOTE — Telephone Encounter (Signed)
Pt called back. Frustrated that she had not heard back from [place] yet. Pt states this is not a new problem and that Rollene Fare is aware of her condition. Rollene Fare is treating her for this. Pt needs potassium and/or calcium checked to see if her medication dose needs to be increased. She thinks potassium/electrolytes are running low. She has taken 2 1/2 potassium pills since 2 am this morning and drank some milk which feels some better now. Pt is off work today and would like to come in for labs only. She is having numbness around face. Pt states she is willing to make appt if labs come back low to discuss but doesn't think she needs appt to see Rollene Fare for something she is being treating for.  Please call pt on cell phone [phone] Home phone not working Verified with pt that [facility] Silversmith is her PCP and not Dr. Dorisann Frames. Changed in Epic today 04/27/15

## 2015-04-28 DIAGNOSIS — E213 Hyperparathyroidism, unspecified: Secondary | ICD-10-CM | POA: Insufficient documentation

## 2015-04-29 ENCOUNTER — Ambulatory Visit: Payer: Federal, State, Local not specified - PPO | Admitting: Primary Care

## 2015-04-29 ENCOUNTER — Encounter: Payer: Self-pay | Admitting: Internal Medicine

## 2015-04-29 NOTE — Progress Notes (Signed)
Received labs from PCP from 04/27/2015: - Calcium 11.2 (2.7-10.2) - albumin 4.7 (3.5-5.5) - GFR 141 - CBC with differential normal

## 2015-05-26 ENCOUNTER — Other Ambulatory Visit: Payer: Self-pay

## 2015-05-26 MED ORDER — KLOR-CON M20 20 MEQ PO TBCR: 20.0000 meq | EXTENDED_RELEASE_TABLET | Freq: Every day | ORAL | Status: DC

## 2015-06-07 ENCOUNTER — Telehealth: Payer: Self-pay | Admitting: Internal Medicine

## 2015-06-07 NOTE — Telephone Encounter (Signed)
PLEASE NOTE: All timestamps contained within this report are represented as [place] Standard Time. CONFIDENTIALTY NOTICE: This fax transmission is intended only for the addressee. It contains information that is legally privileged, confidential or otherwise protected from use or disclosure. If you are not the intended recipient, you are strictly prohibited from reviewing, disclosing, copying using or disseminating any of this information or taking any action in reliance on or regarding this information. If you have received this fax in error, please notify us immediately by telephone so that we can arrange for its return to [place]. Phone: [phone], Toll-Free: [phone], Fax: [phone] Page: 1 of 2 Call Id: [id number] [place] Patient Name: Debbie Walter Gender: Female DOB: 1981/02/18 Age: 34 Y 25 D Return Phone Number: [phone] (Primary), [phone] (Secondary) Address: City/State/Zip:  Client [facility] - Client Client Site [place] - Day Physician [address], [place] Type Call Call Type Triage / Clinical Relationship To Patient Self Appointment Disposition EMR Appointment Not Necessary Info pasted into Epic Yes Return Phone Number [phone] (Primary) Chief Complaint Diarrhea Initial Comment Caller states thinks she has a stomach virus, vomited yesterday twice, diarrhea PreDisposition Home Care Nurse Assessment Nurse: Verlin Fester, RN, [facility] Date/Time Eilene Ghazi Time): 06/07/2015 9:02:43 AM Confirm and document reason for call. If symptomatic, describe symptoms. ---Patient states she vomited yesterday X 2, stomach pain and diarrhea X 5. The vomiting is gone and the stomach pain is better. Has the patient traveled out of the country within the last 30 days? ---No Does the patient require triage? ---Yes Related visit to physician  within the last 2 weeks? ---No Does the PT have any chronic conditions? (i.e. diabetes, asthma, etc.) ---Yes List chronic conditions. ---"high calcium, high blood pressure" Did the patient indicate they were pregnant? ---No Guidelines Guideline Title Affirmed Question Affirmed Notes Nurse Date/Time (Eastern Time) Diarrhea MILD-MODERATE diarrhea (e.g., 1-6 times / day more than normal) (all triage questions negative) Verlin Fester, RN, [facility] 06/07/2015 9:05:07 AM Disp. Time Eilene Ghazi Time) Disposition Final User 06/07/2015 9:18:00 AM [place], RN, Nemia[facility] Commander Understands: Yes PLEASE NOTE: All timestamps contained within this report are represented as [place] Standard Time. CONFIDENTIALTY NOTICE: This fax transmission is intended only for the addressee. It contains information that is legally privileged, confidential or otherwise protected from use or disclosure. If you are not the intended recipient, you are strictly prohibited from reviewing, disclosing, copying using or disseminating any of this information or taking any action in reliance on or regarding this information. If you have received this fax in error, please notify us immediately by telephone so that we can arrange for its return to [place]. Phone: [phone], Toll-Free: [phone], Fax: [phone] Page: 2 of 2 Call Id: [id number] Disagree/Comply: Comply Care Advice Given Per Guideline HOME CARE: You should be able to treat this at home. REASSURANCE: * Sometimes the cause is an infection caused by a virus ('stomach flu) or a bacteria. Diarrhea is one of the body's way of getting rid of germs. * Certain foods (e.g. dairy products, supplements like Ensure) can also trigger diarrhea. * In some patients, the exact cause is never found. * Staying well-hydrated is the key for adults with diarrhea. From what you have told me, it sounds like you are not severely dehydrated at this point. * Here is some general care advice that should  help. FLUID THERAPY DURING MILD-MODERATE DIARRHEA: * Drink more fluids,  at least 8-10 cups daily. One cup equals 8 oz (240 ml). * WATER: For mild to moderate diarrhea, water is often the best liquid to drink. You should also eat some salty foods (e.g., potato chips, pretzels, saltine crackers). This is important to make sure you are getting enough salt, sugars, and fluids to meet your body's needs. * SPORTS DRINKS: You can also drink a sports drinks (e.g., Gatorade, Powerade) to help treat and prevent dehydration. For it to work best, mix it half and half with water. * Avoid caffeinated beverages (Reason: caffeine is mildly dehydrating). * Avoid alcohol beverages (beer, wine, hard liquor). FOOD AND NUTRITION DURING MILD-MODERATE DIARRHEA * Maintaining some food intake during episodes of diarrhea is important. * Begin with boiled starches / cereals (e.g., potatoes, rice, noodles, wheat, oats) with a small amount of salt to taste. * Other foods that are OK include: bananas, yogurt, crackers, soup. * As the diarrhea starts to get better, you can slowly return to a normal diet. OTC MEDS - Loperamide (Imodium AD): * Helps reduce diarrhea. * Adult dosage: 4 mg (2 capsules or 4 teaspoons or 20 ml) is the recommended first dose. You may take an additional 2 mg (1 capsule or 2 teaspoons or 10 ml) after each loose BM. * Maximum dosage: 16 mg (8 capsules or 16 teaspoons or 80 ml). * Do not use for more than 2 days. * 1 capsule = 2 mg; 1 teaspoon (5 ml) = 1 mg. CAUTION - Loperamide (Imodium AD): * DO NOT use if there is a fever over 100.5 F (38.1 C) or if there is blood or mucus in the stools. * Read and follow the package instructions carefully. OTC MEDS - Bismuth Subsalicylate (e.g., Kaopectate, Pepto-Bismol): * Helps reduce diarrhea, vomiting, and abdominal cramping. * Adult dosage: two tablets or two tablespoons by mouth every hour (if diarrhea continues) to a maximum of 8 doses in a 24 hour period. * Do not  use for more than 2 days. * May cause a temporary darkening of stool and tongue. * Do not use if allergic to aspirin. * Do not use in pregnancy. CONTAGIOUSNESS: * Be certain to wash your hands after using the restroom. * If your work is cooking, Research scientist (medical), serving or preparing food, then you should not work until the diarrhea has completely stopped. EXPECTED COURSE: Viral diarrhea lasts 4-7 days. Always worse on days 1 and 2. CALL BACK IF: * Signs of dehydration occur (e.g., no urine over 12 hours, very dry mouth, lightheaded, etc.) * Diarrhea persists over 7 days * You become worse. CARE ADVICE given per Diarrhea (Adult) guideline. After Care Instructions Given Call Event Type User Date / Time Description

## 2015-06-07 NOTE — Telephone Encounter (Signed)
Patient Name: Debbie Walter  DOB: December 14, 1980    Initial Comment Caller states thinks she has a stomach virus, vomited yesterday twice, diarrhea   Nurse Assessment  Nurse: Verlin Fester, RN, [facility] Date/Time (Eastern Time): 06/07/2015 9:02:43 AM  Confirm and document reason for call. If symptomatic, describe symptoms. ---Patient states she vomited yesterday X 2, stomach pain and diarrhea X 5. The vomiting is gone and the stomach pain is better.  Has the patient traveled out of the country within the last 30 days? ---No  Does the patient require triage? ---Yes  Related visit to physician within the last 2 weeks? ---No  Does the PT have any chronic conditions? (i.e. diabetes, asthma, etc.) ---Yes  List chronic conditions. ---"high calcium, high blood pressure"  Did the patient indicate they were pregnant? ---No     Guidelines    Guideline Title Affirmed Question Affirmed Notes  Diarrhea MILD-MODERATE diarrhea (e.g., 1-6 times / day more than normal) (all triage questions negative)    Final Disposition User   [place], RN, [facility]    Disagree/Comply: Comply

## 2015-06-08 ENCOUNTER — Telehealth: Payer: Self-pay | Admitting: Internal Medicine

## 2015-06-08 NOTE — Telephone Encounter (Signed)
Pt called stating she needs you to fill out fmla paperwork She stated she has been to urgent care several times for anxiety She stated her calcium goes up or down and this caused her anxiety

## 2015-06-11 DIAGNOSIS — Z0279 Encounter for issue of other medical certificate: Secondary | ICD-10-CM

## 2015-06-11 NOTE — Telephone Encounter (Signed)
Done, given back to Robin 

## 2015-06-11 NOTE — Telephone Encounter (Signed)
Pt dropped off fmla paperwork to be filled out In [facility]  For review and signature

## 2015-06-11 NOTE — Telephone Encounter (Signed)
Left messsage letting pt know paperwork has been faxed  Copy for pt Copy for scan Copy for billing Copy for file

## 2015-06-18 ENCOUNTER — Other Ambulatory Visit: Payer: Self-pay

## 2015-06-18 DIAGNOSIS — I10 Essential (primary) hypertension: Secondary | ICD-10-CM

## 2015-06-18 MED ORDER — PANTOPRAZOLE SODIUM 40 MG PO TBEC: 40.0000 mg | DELAYED_RELEASE_TABLET | Freq: Every day | ORAL | Status: DC

## 2015-06-18 MED ORDER — VALSARTAN 80 MG PO TABS: 80.0000 mg | ORAL_TABLET | Freq: Every day | ORAL | Status: DC

## 2015-06-18 MED ORDER — KLOR-CON M20 20 MEQ PO TBCR: 20.0000 meq | EXTENDED_RELEASE_TABLET | Freq: Every day | ORAL | Status: DC

## 2015-06-18 MED ORDER — CHLORTHALIDONE 25 MG PO TABS: 25.0000 mg | ORAL_TABLET | Freq: Every day | ORAL | Status: DC

## 2015-06-18 MED ORDER — METOPROLOL TARTRATE 50 MG PO TABS: 50.0000 mg | ORAL_TABLET | Freq: Two times a day (BID) | ORAL | Status: DC

## 2015-07-16 ENCOUNTER — Ambulatory Visit (INDEPENDENT_AMBULATORY_CARE_PROVIDER_SITE_OTHER): Payer: Federal, State, Local not specified - PPO | Admitting: Internal Medicine

## 2015-07-16 ENCOUNTER — Other Ambulatory Visit: Payer: Self-pay | Admitting: *Deleted

## 2015-07-16 VITALS — BP 122/80 | HR 82 | Temp 98.3°F | Resp 12 | Wt 201.0 lb

## 2015-07-16 DIAGNOSIS — E282 Polycystic ovarian syndrome: Secondary | ICD-10-CM | POA: Diagnosis not present

## 2015-07-16 DIAGNOSIS — R825 Elevated urine levels of drugs, medicaments and biological substances: Secondary | ICD-10-CM | POA: Diagnosis not present

## 2015-07-16 DIAGNOSIS — Z8639 Personal history of other endocrine, nutritional and metabolic disease: Secondary | ICD-10-CM | POA: Diagnosis not present

## 2015-07-16 DIAGNOSIS — E042 Nontoxic multinodular goiter: Secondary | ICD-10-CM

## 2015-07-16 LAB — VITAMIN D 25 HYDROXY (VIT D DEFICIENCY, FRACTURES): VITD: 39.42 ng/mL (ref 30.00–100.00)

## 2015-07-16 LAB — HEMOGLOBIN A1C: HEMOGLOBIN A1C: 5.7 % (ref 4.6–6.5)

## 2015-07-16 MED ORDER — GLUCOSE BLOOD VI STRP: ORAL_STRIP | Status: DC

## 2015-07-16 MED ORDER — ONETOUCH DELICA LANCETS FINE MISC: Status: DC

## 2015-07-16 NOTE — Patient Instructions (Addendum)
Please stop at the lab.  Please return in 6 months. 

## 2015-07-16 NOTE — Progress Notes (Addendum)
Patient ID: Debbie Walter, female   DOB: 10-03-1980, 34 y.o.   MRN: [id number]    HPI  Debbie Walter is a 34 y.o.-year-old female, initially referred by her PCP, Dr. Elijio Miles, for management of elevated metanephrines, hypercalcemia, elevated HbA1c. Last visit 3 mo ago.  Elevated urinary metanephrines: Reviewed and addended hx:  Pt. has been dx with hypertension dx at 29 (2003), had preeclampsia with her last pregnancy (has a 64 mo old). She also has a 3.5 mo old - was on Alpha methyl-dopa >> BP controlled then. This med did not help for the second pregnancy >> was on Metoprolol and Verapamil. Now off Verapamil, taking: - Metoprolol 50 mg bid - has been on this for years, increased 1 year ago - Valsartan 160 mg daily - Chlorthalidone 25 mg daily She was on Amlodipine 5 mg daily >> dropped BP too low and developed dizziness.  During the investigation for hypertension, she had a 24-hour urine fractionated metanephrine level checked in 07/20/2014 and the metanephrines returned slightly elevated as shown below. Patient mentions that she was still drinking caffeinated drinks and wine cooler during the period of the collection: Total volume 2.6 L Metanephrine 233 g per 24 hour (36-190) Normetanephrine 382 (35-482) Total metanephrines 614 (643-329).  Of note, at that time, she had the PRA 0.44 (0.25-5.8) with a potassium of 4.1. The test was run at [facility].  She had a repeat 24-hour urine for fractionated metanephrines, which again showed a slightly elevated metanephrine, practically unchanged from prior, despite being off caffeine and alcohol (not on Buspar yet):  Total volume 1.9 L Metanephrine 238 g per 24 hour (36-190) Normetanephrine 282 (35-482) Total metanephrines 520 (518-841).   Latest check >> normal plasma metanephrines, normal urinary catecholamines, slightly elevated urinary metanephrines: Component     Latest Ref Rng 01/07/2015  Total Volume - CF 24Hr U      1500   Epinephrine, 24 hr Urine      REPORT  Norepinephrine, 24 hr Ur     15 - 100 mcg/24 h 55  Calculated Total (E+NE)     26 - 121 mcg/24 h 55  Dopamine, 24 hr Urine     52 - 480 mcg/24 h 394  Creatinine, Urine mg/day-CATEUR     0.63 - 2.50 g/24 h 1.70  Metanephrine, Free     <=57 pg/mL 45  Normetanephrine, Free     <=148 pg/mL 47  Total Metanephrines-Plasma     <=205 pg/mL 92  Metanephrines, Ur     36 - 190 mcg/24 h 280 (H)  Normetanephrine, 24H Ur     35 - 482 mcg/24 h 287  Metaneph Total, Ur     115 - 695 mcg/24 h 567  Creatinine, Urine      123.2  Creatinine, 24H Ur     700 - 1800 mg/day 1848 (H)   She is now on BuSpar.  Of note, she had 2 abdominal CT scans: in 2009 and 2014 and there were no adrenal masses.  Hyperparathyroidism/hypercalcemia: Patient describes that she has a history of R parathyroidectomy at 34 y/o. She had HTN then. She saw endo then >> Dr Windell Norfolk >> had issues with low calcium then >> anxiety >> started to take calcium. She is on calcium 600-400 mg-U every other day (switched after calcium 10.7 on 07/16/2014). Calcium was 11 on 09/03/2014.  She had a kidney stone and had lithotripsy in 2014.  Reviewed pertinent labs: Component     Latest  Ref Rng 12/02/2014  Calcium     8.4 - 10.5 mg/dL 10.3  GFR     >60.00 mL/min 109.36  PTH     15 - 65 pg/mL 57  VITD     30.00 - 100.00 ng/mL 39.36   12/15/2014:  Calcium 10.9 (8.4-10.5) - took Tums before the test!, magnesium 1.8 (1.5-2.5), BUN/creatinine 10/0.63. Sodium 141, potassium 3.6.  04/27/2015: Calcium 11.2 (8.7-10.2)  06/09/2015:  Ionized calcium: 5.2 (4.5-5.6), calcium 9.8 (8.7-10.2), magnesium 2.1 (1.6-2.3), potassium 3.4 (3.5-5.2). She is on vit D 1000 units daily.  Elevated hemoglobin A1c: She has a diagnosis of PCOS. She is on metformin 500 mg twice a day. Latest hemoglobin A1c: Lab Results  Component Value Date   HGBA1C 6.1 12/02/2014   Pt describes: - + regular menses now - +  weight loss - + insomnia - no fatigue - + Anxiety, especially after she found out that she might have an adrenal tumor, now better - no palpitations - + tremors - no HA  - no CP - no SOB  Pt denies feeling nodules in neck, hoarseness, dysphagia/odynophagia, SOB with lying down.  No FH of thyroid cancer. FH with HTN: M,F,S. No pituitary tumor FH.  She has a 80-year old girl.   ROS: Constitutional: + weight loss, no fatigue, no subjective hyperthermia/hypothermia, + poor sleep Eyes: no blurry vision, no xerophthalmia ENT: no sore throat, no nodules palpated in throat, no dysphagia/odynophagia, no hoarseness Cardiovascular: no CP/SOB/palpitations/no leg swelling Respiratory: no cough/SOB Gastrointestinal: no N/V/D/C Musculoskeletal: no muscle/joint aches Skin: no rashes Neurological: no tremors/numbness/tingling/dizziness  I reviewed pt's medications, allergies, PMH, social hx, family hx, and changes were documented in the history of present illness. Otherwise, unchanged from my initial visit note:  Past Medical History  Diagnosis Date  . Panic attacks   . Hypertension   . Fibroids   . PCOS (polycystic ovarian syndrome)     takes metformin for this  . Kidney stones 5/14  . GERD (gastroesophageal reflux disease)     takes protonix   Past Surgical History  Procedure Laterality Date  . Parathyroidectomy    . Cesarean section    . Parathyroidectomy    . Hysteroscopy    . Cesarean section with bilateral tubal ligation Bilateral 05/15/2014    Procedure: CESAREAN SECTION WITH BILATERAL TUBAL LIGATION;  Surgeon: Cyril Mourning, MD;  Location: [facility];  Service: Obstetrics;  Laterality: Bilateral;  repeat  edc 06/10/14   History   Social History  . Marital Status: Married    Spouse Name: N/A    Number of Children: 2 (4 miscarriages)   Occupational History  . Nurse    Social History Main Topics  . Smoking status: Never Smoker   . Smokeless tobacco: Never Used  .  Alcohol Use: No  . Drug Use: No  . Sexual Activity: Yes   Current Outpatient Prescriptions on File Prior to Visit  Medication Sig Dispense Refill  . ALPRAZolam (XANAX) 0.25 MG tablet Take 0.25 mg by mouth as needed.   0  . busPIRone (BUSPAR) 5 MG tablet Take 1 tablet (5 mg total) by mouth daily. 30 tablet 1  . chlorthalidone (HYGROTON) 25 MG tablet Take 1 tablet (25 mg total) by mouth daily. 90 tablet 0  . cholecalciferol (VITAMIN D) 1000 UNITS tablet Take 1,000 Units by mouth every other day.    . famotidine (PEPCID) 20 MG tablet Take 20 mg by mouth as needed for heartburn or indigestion.    Marland Kitchen  ibuprofen (ADVIL,MOTRIN) 600 MG tablet Take 1 tablet (600 mg total) by mouth every 6 (six) hours. 30 tablet 0  . KLOR-CON M20 20 MEQ tablet Take 1 tablet (20 mEq total) by mouth daily. 90 tablet 0  . metFORMIN (GLUCOPHAGE) 500 MG tablet Take 1 tablet (500 mg total) by mouth 2 (two) times daily with a meal. 180 tablet 3  . metoprolol (LOPRESSOR) 50 MG tablet Take 1 tablet (50 mg total) by mouth 2 (two) times daily. 180 tablet 0  . pantoprazole (PROTONIX) 40 MG tablet Take 1 tablet (40 mg total) by mouth daily. 90 tablet 0  . Prenat w/o A-FeCbGl-DSS-FA-DHA (CITRANATAL ASSURE) 35-1 & 300 MG tablet Take 1 tablet by mouth daily.  11  . valsartan (DIOVAN) 80 MG tablet Take 1 tablet (80 mg total) by mouth daily. 90 tablet 0   No current facility-administered medications on file prior to visit.   Allergies  Allergen Reactions  . Procardia [Nifedipine] Other (See Comments)    headache  . Wellbutrin [Bupropion] Other (See Comments)    "Makes my blood pressure go up - I don't ever want to take it again"  . Labetalol Other (See Comments)    "makes me feel like things are crawling through my head"  . Zithromax [Azithromycin] Other (See Comments)    Gi upset.  Diarrhea   Family History  Problem Relation Age of Onset  . Anesthesia problems Neg Hx   . Hypotension Neg Hx   . Malignant hyperthermia Neg Hx    . Pseudochol deficiency Neg Hx   . Hyperlipidemia Mother   . Heart disease Mother   . Diabetes Mother   . Hypertension Mother   . Hyperlipidemia Father   . Hypertension Father   . Cancer Father     rectal  . Hypertension Sister     diabetes in mother  Hypertension in mother and father  Acute MI in mother  Rectal cancer in father  PE: BP 122/80 mmHg  Pulse 82  Temp(Src) 98.3 F (36.8 C) (Oral)  Resp 12  Wt 201 lb (91.173 kg)  SpO2 96% Body mass index is 36.75 kg/(m^2). Wt Readings from Last 3 Encounters:  07/16/15 201 lb (91.173 kg)  03/03/15 208 lb (94.348 kg)  02/26/15 209 lb (94.802 kg)   Constitutional: obese, [place] fat pads not enlarged  in NAD Eyes: PERRLA, EOMI, no exophthalmos ENT: moist mucous membranes, no thyromegaly, no cervical lymphadenopathy Cardiovascular: RRR, No MRG Respiratory: CTA B Gastrointestinal: abdomen soft, NT, ND, BS+ Musculoskeletal: no deformities, strength intact in all 4 Skin: moist, warm, no rashes Neurological: + mild tremor with outstretched hands, DTR normal in all 4  ASSESSMENT: 1. Elevated urinary metanephrines  - Mild  2. Hypercalcemia - History of right parathyroidectomy for primary hyperparathyroidism   PLAN:  1. Mildly elevated urinary metanephrines  - Patient with history of hypertension since she was 34 years old, and preeclampsia with her last pregnancy.  Urine metanephrines checked after birth returned slightly elevated and this was repeated 3x. The last check, her plasma metanephrines were normal however, the urine metanephrines were elevated. She is now off BuSpar. No apparent offending agents taken during the urine collection. - We again had a long discussion about the fact that her urinary metanephrines are very slightly elevated, and this is a not uncommon in patients with essential hypertension, during panic attacks, or if taking certain medicines, coffee, smoking, alcohol. To be clinically significant, the  elevation should be at least 2-3 times higher  than the upper limit of normal, most frequently approximately 4 times.  - The one thing that it's concerning is that she has a history of parathyroidectomy for primary hyperparathyroidism while very young might 51. She does not have family history of hypercalcemia, kidney stones, thyroid cancer, renal cell cancer, pheochromocytoma, to point towards an MEN2A syndrome, but this can happen spontaneously.  - we decided to repeat her urinary metanephrines. Given a list of substances that can interfere with the test. If still mildly high >> we can just follow this yearly. - I reviewed her previous abdominal CT scan reports (both done without contrast): From 04/02/2008 and 02/10/2013. Both report normal adrenals and no suspicious abdominal masses.   2. Hypercalcemia - Please see above - She has a history of primary hyperparathyroidism at 15 and she is now status post R parathyroidectomy.  - she has had elevated calcium levels, but she mentioned that she took Tums before the lab. PTH normal 12/02/3014 for a calcium of 10.3 (8.4-10.5). - In 05/2015, her vitamin D, ionized calcium and magnesium were normal. - will check a PTH + Ca and  vit D  today  3. PCOS - Patient continues on metformin - lost quite a bit of weight >> congratulated her - Will repeat a hemoglobin A1c today - will give her a glucometer to check CBGs at home  - time spent with the patient: 40 min, of which >50% was spent in obtaining information about her symptoms, reviewing together her previous labs, evaluations, counseling her about her condition (please see the discussed topics above), and developing a plan to further investigate it. She had a number of questions which I addressed.  Office Visit on 07/16/2015  Component Date Value Ref Range Status  . Hgb A1c MFr Bld 07/16/2015 5.7  4.6 - 6.5 % Final   Glycemic Control Guidelines for People with Diabetes:Non Diabetic:  <6%Goal of Therapy:  <7%Additional Action Suggested:  >8%   . VITD 07/16/2015 39.42  30.00 - 100.00 ng/mL Final  . Calcium 07/16/2015 10.4* 8.7 - 10.2 mg/dL Final  . PTH 07/16/2015 53  15 - 65 pg/mL Final  . PTH 07/16/2015 Comment   Final   Comment: Interpretation                 Intact PTH    Calcium                                 (pg/mL)      (mg/dL) Normal                          15 - 65     8.6 - 10.2 Primary Hyperparathyroidism         >65          >10.2 Secondary Hyperparathyroidism       >65          <10.2 Non-Parathyroid Hypercalcemia       <65          >10.2 Hypoparathyroidism                  <15          < 8.6 Non-Parathyroid Hypocalcemia    15 - 65          < 8.6    HbA1c improved! Vit D normal. Ca slightly high and PTH not suppressed. Will add  a 24h urine calcium level to her urine collection.  Component     Latest Ref Rng 07/26/2015          Metanephrines, Ur     36 - 190 mcg/24 h 250 (H)  Normetanephrine, 24H Ur     35 - 482 mcg/24 h 285  Metaneph Total, Ur     115 - 695 mcg/24 h 535  Creatinine, Urine     20 - 320 mg/dL 88  Creatinine, 24H Ur     0.63 - 2.50 g/24 h 2.29  Calcium, Ur     Not estab mg/dL 7  Calcium, 24 hour urine     35 - 250 mg/24 h 182   Urine metanephrine still elevated, slightly improved. Urine calcium not elevated, but not low either. Calcium can be elevated in the setting of excess catecholamines.  Due to the persistent elevation in metanephrines, I would like to check an MIBG scan to look for pheochromocytoma or paraganglioma. She needs to be off Buspar for at least 3 weeks before the test.    CLINICAL DATA: Elevated urine catecholamines. Normal adrenal glands by CT. Evaluate for occult pheochromocytoma.  EXAM: NUCLEAR MEDICINE MIBG SCAN with SPECT imaging  TECHNIQUE: Following intravenous administration of radiopharmaceutical, whole body images of the head, neck, trunk, and extremities were obtained on subsequent days. SPECT imaging of the neck  and chest was performed at 24 hours. Patient received Lugol's solution prior demonstration of radiotracer and well continue administration for 6 days post scan.  RADIOPHARMACEUTICALS: 52.7 millicurie I 782 MIBG.  COMPARISON: CT 02/11/2015  FINDINGS: No abnormal accumulation of radiotracer on the whole-body planar imaging. Physiologic uptake is noted in the liver and to lesser degree lungs and GI tract.  Focal activity is noted in the LEFT neck. Dedicated planar and SPECT imaging of the neck demonstrate this activity to localize to the blocked LEFT lobe of thyroid gland. Activity in blocked thyroid glands can be seen in a minority of cases. Presumed RIGHT hemi thyroidectomy in the past.  IMPRESSION: 1. No evidence of pheochromocytoma on whole-body MIBG scan. 2. 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   Electronically Signed By: Suzy Bouchard M.D. On: 09/10/2015 16:49  No sign of pheochromocytoma or paraganglioma. Increased activity in the left thyroid lobe >> will need a thyroid ultrasound. Patient informed.  Thyroid ultrasound (10/01/2015):   Right thyroid lobe: Surgically absent  Left thyroid lobe: 5.9 x 1.6 x 2.5 cm. Homogeneous left thyroid gland echotexture. Small incidental hypoechoic left mid and lower pole nodules (3) all measuring 7 mm or less in size. These remain nonspecific.  Isthmus Thickness: 3 mm. No nodules visualized.  Lymphadenopathy: None visualized.  IMPRESSION: Previous right thyroidectomy. No abnormality demonstrated by ultrasound in the right thyroid bed. Incidental sub cm left mid and lower pole nodules (3), all measuring 7 mm or less in size. No adenopathy  No further investigation needed for now but I plan to repeat her thyroid ultrasound in ~2 years.

## 2015-07-17 LAB — PTH, INTACT AND CALCIUM
CALCIUM: 10.4 mg/dL — AB (ref 8.7–10.2)
PTH: 53 pg/mL (ref 15–65)

## 2015-07-26 ENCOUNTER — Telehealth: Payer: Self-pay | Admitting: Internal Medicine

## 2015-07-26 ENCOUNTER — Other Ambulatory Visit: Payer: Federal, State, Local not specified - PPO

## 2015-07-26 NOTE — Telephone Encounter (Signed)
Please call back regarding lab test result

## 2015-07-26 NOTE — Telephone Encounter (Signed)
Returned pt's call and advised her of her calcium level. Pt stated she came in today to bring her 24 hr urine and did other labs. Advised pt will let her know what her lab results are once we receive the. Pt voiced understanding.

## 2015-07-27 LAB — CALCIUM, URINE, 24 HOUR
Calcium, 24 hour urine: 182 mg/24 h (ref 35–250)
Calcium, Ur: 7 mg/dL

## 2015-07-27 LAB — CREATININE, URINE, 24 HOUR
CREATININE, URINE: 88 mg/dL (ref 20–320)
Creatinine, 24H Ur: 2.29 g/(24.h) (ref 0.63–2.50)

## 2015-07-29 ENCOUNTER — Ambulatory Visit: Payer: Federal, State, Local not specified - PPO | Admitting: Internal Medicine

## 2015-07-30 LAB — METANEPHRINES, URINE, 24 HOUR
METANEPH TOTAL UR: 535 ug/(24.h) (ref 115–695)
METANEPHRINES UR: 250 ug/(24.h) — AB (ref 36–190)
NORMETANEPHRINE 24H UR: 285 ug/(24.h) (ref 35–482)

## 2015-08-03 ENCOUNTER — Ambulatory Visit (INDEPENDENT_AMBULATORY_CARE_PROVIDER_SITE_OTHER): Payer: Federal, State, Local not specified - PPO | Admitting: Internal Medicine

## 2015-08-03 ENCOUNTER — Encounter: Payer: Self-pay | Admitting: Internal Medicine

## 2015-08-03 ENCOUNTER — Encounter (INDEPENDENT_AMBULATORY_CARE_PROVIDER_SITE_OTHER): Payer: Self-pay

## 2015-08-03 ENCOUNTER — Other Ambulatory Visit: Payer: Self-pay | Admitting: Internal Medicine

## 2015-08-03 VITALS — BP 136/82 | HR 93 | Temp 98.8°F | Wt 201.0 lb

## 2015-08-03 DIAGNOSIS — F411 Generalized anxiety disorder: Secondary | ICD-10-CM | POA: Diagnosis not present

## 2015-08-03 DIAGNOSIS — J069 Acute upper respiratory infection, unspecified: Secondary | ICD-10-CM | POA: Diagnosis not present

## 2015-08-03 MED ORDER — HYDROCODONE-HOMATROPINE 5-1.5 MG/5ML PO SYRP: 5.0000 mL | ORAL_SOLUTION | Freq: Three times a day (TID) | ORAL | Status: DC | PRN

## 2015-08-03 MED ORDER — BUSPIRONE HCL 5 MG PO TABS: 5.0000 mg | ORAL_TABLET | Freq: Every day | ORAL | Status: DC

## 2015-08-03 NOTE — Telephone Encounter (Signed)
Rx called in to pharmacy. 

## 2015-08-03 NOTE — Patient Instructions (Signed)
Upper Respiratory Infection, Adult Most upper respiratory infections (URIs) are a viral infection of the air passages leading to the lungs. A URI affects the nose, throat, and upper air passages. The most common type of URI is nasopharyngitis and is typically referred to as "the common cold." URIs run their course and usually go away on their own. Most of the time, a URI does not require medical attention, but sometimes a bacterial infection in the upper airways can follow a viral infection. This is called a secondary infection. Sinus and middle ear infections are common types of secondary upper respiratory infections. Bacterial pneumonia can also complicate a URI. A URI can worsen asthma and chronic obstructive pulmonary disease (COPD). Sometimes, these complications can require emergency medical care and may be life threatening.  CAUSES Almost all URIs are caused by viruses. A virus is a type of germ and can spread from one person to another.  RISKS FACTORS You may be at risk for a URI if:   You smoke.   You have chronic heart or lung disease.  You have a weakened defense (immune) system.   You are very young or very old.   You have nasal allergies or asthma.  You work in crowded or poorly ventilated areas.  You work in health care facilities or schools. SIGNS AND SYMPTOMS  Symptoms typically develop 2-3 days after you come in contact with a cold virus. Most viral URIs last 7-10 days. However, viral URIs from the influenza virus (flu virus) can last 14-18 days and are typically more severe. Symptoms may include:   Runny or stuffy (congested) nose.   Sneezing.   Cough.   Sore throat.   Headache.   Fatigue.   Fever.   Loss of appetite.   Pain in your forehead, behind your eyes, and over your cheekbones (sinus pain).  Muscle aches.  DIAGNOSIS  Your health care provider may diagnose a URI by:  Physical exam.  Tests to check that your symptoms are not due to  another condition such as:  Strep throat.  Sinusitis.  Pneumonia.  Asthma. TREATMENT  A URI goes away on its own with time. It cannot be cured with medicines, but medicines may be prescribed or recommended to relieve symptoms. Medicines may help:  Reduce your fever.  Reduce your cough.  Relieve nasal congestion. HOME CARE INSTRUCTIONS   Take medicines only as directed by your health care provider.   Gargle warm saltwater or take cough drops to comfort your throat as directed by your health care provider.  Use a warm mist humidifier or inhale steam from a shower to increase air moisture. This may make it easier to breathe.  Drink enough fluid to keep your urine clear or pale yellow.   Eat soups and other clear broths and maintain good nutrition.   Rest as needed.   Return to work when your temperature has returned to normal or as your health care provider advises. You may need to stay home longer to avoid infecting others. You can also use a face mask and careful hand washing to prevent spread of the virus.  Increase the usage of your inhaler if you have asthma.   Do not use any tobacco products, including cigarettes, chewing tobacco, or electronic cigarettes. If you need help quitting, ask your health care provider. PREVENTION  The best way to protect yourself from getting a cold is to practice good hygiene.   Avoid oral or hand contact with people with cold   symptoms.   Wash your hands often if contact occurs.  There is no clear evidence that vitamin C, vitamin E, echinacea, or exercise reduces the chance of developing a cold. However, it is always recommended to get plenty of rest, exercise, and practice good nutrition.  SEEK MEDICAL CARE IF:   You are getting worse rather than better.   Your symptoms are not controlled by medicine.   You have chills.  You have worsening shortness of breath.  You have brown or red mucus.  You have yellow or brown nasal  discharge.  You have pain in your face, especially when you bend forward.  You have a fever.  You have swollen neck glands.  You have pain while swallowing.  You have white areas in the back of your throat. SEEK IMMEDIATE MEDICAL CARE IF:   You have severe or persistent:  Headache.  Ear pain.  Sinus pain.  Chest pain.  You have chronic lung disease and any of the following:  Wheezing.  Prolonged cough.  Coughing up blood.  A change in your usual mucus.  You have a stiff neck.  You have changes in your:  Vision.  Hearing.  Thinking.  Mood. MAKE SURE YOU:   Understand these instructions.  Will watch your condition.  Will get help right away if you are not doing well or get worse.   This information is not intended to replace advice given to you by your health care provider. Make sure you discuss any questions you have with your health care provider.   Document Released: 02/28/2001 Document Revised: 01/19/2015 Document Reviewed: 12/10/2013 [employer] Interactive Patient Education 2016 [employer]  

## 2015-08-03 NOTE — Progress Notes (Signed)
HPI  Pt presents to the clinic today with c/o runny nose, sneezing, PND and cough. This started yesterday. The cough is productive of yellow mucous at times. She denies fever, chills or body aches. She has tried Tylenol cold and sinus and Coricidin with minimal relief. She denies history of allergies or breathing problems. She has not had sick contacts that she is aware of. She did get her flu shot 2 weeks ago.  She also wants to restart her Buspar. She stopped taking it a few months ago because she felt like she didn't need it. She has been feeling more anxious and on edge lately. She denies depression, SI/HI. She would like it refilled today if she can.  Review of Systems      Past Medical History  Diagnosis Date  . Panic attacks   . Hypertension   . Fibroids   . PCOS (polycystic ovarian syndrome)     takes metformin for this  . Kidney stones 5/14  . GERD (gastroesophageal reflux disease)     takes protonix    Family History  Problem Relation Age of Onset  . Anesthesia problems Neg Hx   . Hypotension Neg Hx   . Malignant hyperthermia Neg Hx   . Pseudochol deficiency Neg Hx   . Hyperlipidemia Mother   . Heart disease Mother   . Diabetes Mother   . Hypertension Mother   . Hyperlipidemia Father   . Hypertension Father   . Cancer Father     rectal  . Hypertension Sister     Social History   Social History  . Marital Status: Married    Spouse Name: N/A  . Number of Children: N/A  . Years of Education: N/A   Occupational History  . Not on file.   Social History Main Topics  . Smoking status: Never Smoker   . Smokeless tobacco: Never Used  . Alcohol Use: No  . Drug Use: No  . Sexual Activity: Yes    Birth Control/ Protection: None   Other Topics Concern  . Not on file   Social History Narrative    Allergies  Allergen Reactions  . Procardia [Nifedipine] Other (See Comments)    headache  . Wellbutrin [Bupropion] Other (See Comments)    "Makes my blood  pressure go up - I don't ever want to take it again"  . Labetalol Other (See Comments)    "makes me feel like things are crawling through my head"  . Zithromax [Azithromycin] Other (See Comments)    Gi upset.  Diarrhea     Constitutional:  Denies headache, fatigue, fever or abrupt weight changes.  HEENT:  Positive runny nose, sneezing. Denies eye redness, eye pain, pressure behind the eyes, facial pain, nasal congestion, ear pain, ringing in the ears, wax buildup, runny nose or bloody nose. Respiratory: Positive cough. Denies difficulty breathing or shortness of breath.  Cardiovascular: Denies chest pain, chest tightness, palpitations or swelling in the hands or feet.  Psych: Pt reports anxiety. Denies depression, SI/HI.  No other specific complaints in a complete review of systems (except as listed in HPI above).  Objective:   BP 136/82 mmHg  Pulse 93  Temp(Src) 98.8 F (37.1 C) (Oral)  Wt 201 lb (91.173 kg)  SpO2 98%  LMP 07/16/2015  Wt Readings from Last 3 Encounters:  08/03/15 201 lb (91.173 kg)  07/16/15 201 lb (91.173 kg)  03/03/15 208 lb (94.348 kg)     General: Appears her stated age, in NAD.  HEENT: Head: normal shape and size, no sinus tenderness noted; Eyes: sclera white, no icterus, conjunctiva pink; Ears: Tm's gray and intact, normal light reflex; Nose: mucosa pink and moist, septum midline; Throat/Mouth: + PND. Teeth present, mucosa erythematous and moist, no exudate noted, no lesions or ulcerations noted.  Neck: No cervical lymphadenopathy.  Cardiovascular: Normal rate and rhythm. S1,S2 noted.  No murmur, rubs or gallops noted.  Pulmonary/Chest: Normal effort and positive vesicular breath sounds. No respiratory distress. No wheezes, rales or ronchi noted.  Psych: Mood and affect normal.    Assessment & Plan:   Viral Upper Respiratory Infection:  Get some rest and drink plenty of water Do salt water gargles for the sore throat Start Mucinex and  Flonase Ibuprofen as needed for swelling Rx for Hycodan cough syrup  Anxiety:  Will restart Buspar, eRx sent  RTC as needed or if symptoms persist.

## 2015-08-03 NOTE — Telephone Encounter (Signed)
i do not see where you have filled this medication---please advise

## 2015-08-03 NOTE — Progress Notes (Signed)
Pre visit review using our clinic review tool, if applicable. No additional management support is needed unless otherwise documented below in the visit note. 

## 2015-08-03 NOTE — Telephone Encounter (Signed)
Ok to phone in xanax- make pt aware I changed instructions. I do not want her taking it twice daily

## 2015-08-03 NOTE — Progress Notes (Signed)
   Subjective:    Patient ID: Debbie Walter, female    DOB: 06-05-1981, 34 y.o.   MRN: [id number]  HPI Debbie Walter is a 34 year old female who presents today with chief complaint of cough for one day with yellow sputum, sneezing, runny nose and PND.  She denies fever, malaise. She had a flu shot 2 weeks ago. She has taken tylenol sinus and Coricidin without relief.     Review of Systems     Objective:   Physical Exam        Assessment & Plan:

## 2015-08-05 ENCOUNTER — Telehealth: Payer: Self-pay

## 2015-08-05 NOTE — Telephone Encounter (Signed)
She needs to treat with OTC as discussed in the OV for at least 5 days. If no better by Monday, she should let me know, I will send in abx at that time.

## 2015-08-05 NOTE — Telephone Encounter (Signed)
Pt left v/m; pt was seen 08/03/15; pt still having a lot of drainage,S/T, itchy ears,watery eyes; pt concerned has sinus infection and request abx [facility]. Pt request cb.

## 2015-08-06 ENCOUNTER — Encounter: Payer: Self-pay | Admitting: Internal Medicine

## 2015-08-06 NOTE — Telephone Encounter (Signed)
Pt is aware as instructed and expressed understanding 

## 2015-08-09 ENCOUNTER — Telehealth: Payer: Self-pay | Admitting: Internal Medicine

## 2015-08-09 NOTE — Telephone Encounter (Signed)
OK. Let's stay in pole position and start her on the iodine in time so we can get this done by the endo of the year, hopefully.

## 2015-08-09 NOTE — Telephone Encounter (Signed)
Patient called would like her lab results   Please advise    Thank you

## 2015-08-09 NOTE — Telephone Encounter (Signed)
Called pt and read her MyChart lab results. Pt voiced concern but agreed to having the MIBG scan. Pt did not go back on Buspar. Pt will need the [facility] to contact her before scheduling so she can work it out with her work schedule. Be advised.

## 2015-08-10 ENCOUNTER — Telehealth: Payer: Self-pay | Admitting: Internal Medicine

## 2015-08-10 NOTE — Telephone Encounter (Signed)
Pt called follow ing up on call, she is asking if you can call her at [phone] since she is at work now.  If not, can you please leave a detailed message on her voicemail indicating what the course needs to be. (if she needs to take the meds again, if calling in medicine, if something else)

## 2015-08-10 NOTE — Telephone Encounter (Signed)
Have her start taking the Flonase again and make a follow up appt

## 2015-08-10 NOTE — Telephone Encounter (Signed)
Pt requesting callback. Has taken mucinex and flonase for 5 days. Then went off of it, now is having pressure over ears and is requesting antibiotics.   cb number is [phone] is at lunch from 2-230 . Pt is requesting cb today  Thank you

## 2015-08-10 NOTE — Telephone Encounter (Signed)
Pt also has a lot of congestion since coming off of the mucinex.  Pt also feels off balance while walking

## 2015-08-11 ENCOUNTER — Other Ambulatory Visit: Payer: Self-pay | Admitting: Internal Medicine

## 2015-08-11 ENCOUNTER — Ambulatory Visit: Payer: Federal, State, Local not specified - PPO | Admitting: Internal Medicine

## 2015-08-11 ENCOUNTER — Telehealth: Payer: Self-pay

## 2015-08-11 MED ORDER — AMOXICILLIN 500 MG PO CAPS: 500.0000 mg | ORAL_CAPSULE | Freq: Three times a day (TID) | ORAL | Status: DC

## 2015-08-11 NOTE — Telephone Encounter (Signed)
Tried to call pt about the Lugol solution instructions per Dr Cruzita Lederer. Unable to reach pt (vm was full). [facility] called today to be sure we had the protocol, they said they will let her know when they schedule the appt with her. Mailed the protocol to the patient to be sure she had the directions. Be advised.

## 2015-08-11 NOTE — Telephone Encounter (Signed)
I will send in abx, because it is the day before thanksgiving, but next time, she will have to make a follow up appt.

## 2015-08-11 NOTE — Telephone Encounter (Signed)
Pt is aware.  

## 2015-08-11 NOTE — Telephone Encounter (Signed)
Threasa Beards CMA has already spoken with pt and separate note sent to Avie Echevaria NP.

## 2015-08-11 NOTE — Telephone Encounter (Signed)
PLEASE NOTE: All timestamps contained within this report are represented as [place] Standard Time. CONFIDENTIALTY NOTICE: This fax transmission is intended only for the addressee. It contains information that is legally privileged, confidential or otherwise protected from use or disclosure. If you are not the intended recipient, you are strictly prohibited from reviewing, disclosing, copying using or disseminating any of this information or taking any action in reliance on or regarding this information. If you have received this fax in error, please notify us immediately by telephone so that we can arrange for its return to [place]. Phone: [phone], Toll-Free: [phone], Fax: [phone] Page: 1 of 2 Call Id: [id number] [place] Patient Name: Debbie Walter Gender: Female DOB: [birth year] Age: 34 Y 2 M 29 D Return Phone Number: [phone] (Primary), [phone] (Secondary) Address: City/State/Zip: [place] Client [place] Night - Client Client Site [place] Physician Webb Silversmith Contact Type Call Call Type Triage / Clinical Relationship To Patient Self Return Phone Number [phone] (Primary) Chief Complaint Cold Symptom Initial Comment Caller states she thinks she has a sinus infection. She has pressure in her head and ears. PreDisposition Call a family member Nurse Assessment Nurse: Chestine Spore, RN, [place] Date/Time (Eastern Time): 08/10/2015 7:37:41 PM Confirm and document reason for call. If symptomatic, describe symptoms. ---caller states pressure in ears and head. went to [facility]. DX allergies? finished flonose and mucinex. does not feel better Has the patient traveled out of the country within the last 30 days? ---No Does the patient have any new or worsening symptoms? ---Yes Will a triage be completed? ---Yes Related visit to  physician within the last 2 weeks? ---Yes Does the PT have any chronic conditions? (i.e. diabetes, asthma, etc.) ---Yes List chronic conditions. ---hypertension Did the patient indicate they were pregnant? ---No Is this a behavioral health call? ---No Guidelines Guideline Title Affirmed Question Affirmed Notes Nurse Date/Time Eilene Ghazi Time) Sinus Pain or Congestion Reuben Likes, RN, [place] 08/10/2015 7:41:07 PM Disp. Time Eilene Ghazi Time) Disposition Final User 08/10/2015 7:44:34 PM See Physician within 24 Hours Yes Chestine Spore, RN, [place] Caller Understands: Yes Disagree/Comply: Comply PLEASE NOTE: All timestamps contained within this report are represented as [place] Standard Time. CONFIDENTIALTY NOTICE: This fax transmission is intended only for the addressee. It contains information that is legally privileged, confidential or otherwise protected from use or disclosure. If you are not the intended recipient, you are strictly prohibited from reviewing, disclosing, copying using or disseminating any of this information or taking any action in reliance on or regarding this information. If you have received this fax in error, please notify us immediately by telephone so that we can arrange for its return to [place]. Phone: [phone], Toll-Free: [phone], Fax: [phone] Page: 2 of 2 Call Id: [id number] Care Advice Given Per Guideline SEE PHYSICIAN WITHIN 24 HOURS: PAIN MEDICINES: LOCAL COLD: Apply a cold pack or ice in a wet washcloth to the outer ear for 20 minutes. (Note: Author's preference: some adults prefer local heat for 20 minutes). CALL BACK IF: * Difficulty breathing (and not relieved by cleaning out nose) * You become worse. CARE ADVICE given per Sinus Pain or Congestion (Adult) guideline. After Care Instructions Given Call Event Type User Date / Time Description Referrals REFERRED TO PCP OFFICE

## 2015-08-11 NOTE — Telephone Encounter (Signed)
Pt states she has taken everything you have recommended and is not any better more congestion and productive colored sputum---pt states she may not be able to get off early today son not sure if she could come in for appt---pt states if she is able to get off early she will let me know but in the meantime I have pt in the 4:00 slot---pt wants to know if you can send in Ax and/or prednisone--please advise

## 2015-08-16 ENCOUNTER — Other Ambulatory Visit: Payer: Self-pay | Admitting: Internal Medicine

## 2015-08-16 ENCOUNTER — Telehealth: Payer: Self-pay

## 2015-08-16 MED ORDER — FLUCONAZOLE 150 MG PO TABS: 150.0000 mg | ORAL_TABLET | Freq: Once | ORAL | Status: DC

## 2015-08-16 NOTE — Telephone Encounter (Signed)
[facility]

## 2015-08-16 NOTE — Telephone Encounter (Signed)
Pt started abx on 08/11/15; pt has vaginal discharge with vaginal and perineal itching; also perineal swelling. Pt request diflucan x 2 [facility]. Pt used monistat 7 on 08/13/15 with no improvement. Pt request cb.

## 2015-08-16 NOTE — Telephone Encounter (Signed)
Spoke to pt's husband--ok per HIPAA letting him know Rx was sent to pharmacy as requested

## 2015-08-17 ENCOUNTER — Other Ambulatory Visit: Payer: Self-pay | Admitting: Internal Medicine

## 2015-08-17 DIAGNOSIS — Z0000 Encounter for general adult medical examination without abnormal findings: Secondary | ICD-10-CM

## 2015-08-20 ENCOUNTER — Other Ambulatory Visit (INDEPENDENT_AMBULATORY_CARE_PROVIDER_SITE_OTHER): Payer: Federal, State, Local not specified - PPO

## 2015-08-20 ENCOUNTER — Other Ambulatory Visit: Payer: Federal, State, Local not specified - PPO

## 2015-08-20 DIAGNOSIS — Z0000 Encounter for general adult medical examination without abnormal findings: Secondary | ICD-10-CM

## 2015-08-20 LAB — CBC WITH DIFFERENTIAL/PLATELET
BASOS PCT: 0.4 % (ref 0.0–3.0)
Basophils Absolute: 0 10*3/uL (ref 0.0–0.1)
EOS ABS: 0.2 10*3/uL (ref 0.0–0.7)
Eosinophils Relative: 1.9 % (ref 0.0–5.0)
HCT: 38.5 % (ref 36.0–46.0)
HEMOGLOBIN: 12.4 g/dL (ref 12.0–15.0)
LYMPHS ABS: 2 10*3/uL (ref 0.7–4.0)
Lymphocytes Relative: 22.6 % (ref 12.0–46.0)
MCHC: 32.2 g/dL (ref 30.0–36.0)
MCV: 81.1 fl (ref 78.0–100.0)
MONO ABS: 0.4 10*3/uL (ref 0.1–1.0)
Monocytes Relative: 4.9 % (ref 3.0–12.0)
NEUTROS PCT: 70.2 % (ref 43.0–77.0)
Neutro Abs: 6.1 10*3/uL (ref 1.4–7.7)
Platelets: 324 10*3/uL (ref 150.0–400.0)
RBC: 4.75 Mil/uL (ref 3.87–5.11)
RDW: 15 % (ref 11.5–15.5)
WBC: 8.7 10*3/uL (ref 4.0–10.5)

## 2015-08-20 LAB — LIPID PANEL
CHOLESTEROL: 154 mg/dL (ref 0–200)
HDL: 35.2 mg/dL — ABNORMAL LOW (ref >39.00)
LDL Cholesterol: 88 mg/dL (ref 0–99)
NonHDL: 119.21
TRIGLYCERIDES: 157 mg/dL — AB (ref 0.0–149.0)
Total CHOL/HDL Ratio: 4
VLDL: 31.4 mg/dL (ref 0.0–40.0)

## 2015-08-20 LAB — COMPREHENSIVE METABOLIC PANEL
ALBUMIN: 4.2 g/dL (ref 3.5–5.2)
ALT: 24 U/L (ref 0–35)
AST: 17 U/L (ref 0–37)
Alkaline Phosphatase: 52 U/L (ref 39–117)
BILIRUBIN TOTAL: 0.3 mg/dL (ref 0.2–1.2)
BUN: 9 mg/dL (ref 6–23)
CALCIUM: 10.1 mg/dL (ref 8.4–10.5)
CO2: 27 mEq/L (ref 19–32)
CREATININE: 0.69 mg/dL (ref 0.40–1.20)
Chloride: 103 mEq/L (ref 96–112)
GFR: 123.62 mL/min (ref >60.00)
Glucose, Bld: 89 mg/dL (ref 70–99)
Potassium: 3 mEq/L — ABNORMAL LOW (ref 3.5–5.1)
Sodium: 140 mEq/L (ref 135–145)
Total Protein: 7.2 g/dL (ref 6.0–8.3)

## 2015-08-20 LAB — TSH: TSH: 1.79 u[IU]/mL (ref 0.35–4.50)

## 2015-08-27 ENCOUNTER — Encounter: Payer: Self-pay | Admitting: Internal Medicine

## 2015-08-27 ENCOUNTER — Ambulatory Visit (INDEPENDENT_AMBULATORY_CARE_PROVIDER_SITE_OTHER): Payer: Federal, State, Local not specified - PPO | Admitting: Internal Medicine

## 2015-08-27 VITALS — BP 120/84 | HR 82 | Temp 98.5°F | Ht 61.5 in | Wt 198.5 lb

## 2015-08-27 DIAGNOSIS — Z8639 Personal history of other endocrine, nutritional and metabolic disease: Secondary | ICD-10-CM

## 2015-08-27 DIAGNOSIS — E282 Polycystic ovarian syndrome: Secondary | ICD-10-CM | POA: Diagnosis not present

## 2015-08-27 DIAGNOSIS — Z0000 Encounter for general adult medical examination without abnormal findings: Secondary | ICD-10-CM | POA: Diagnosis not present

## 2015-08-27 DIAGNOSIS — R825 Elevated urine levels of drugs, medicaments and biological substances: Secondary | ICD-10-CM

## 2015-08-27 DIAGNOSIS — F411 Generalized anxiety disorder: Secondary | ICD-10-CM

## 2015-08-27 DIAGNOSIS — I10 Essential (primary) hypertension: Secondary | ICD-10-CM

## 2015-08-27 DIAGNOSIS — K219 Gastro-esophageal reflux disease without esophagitis: Secondary | ICD-10-CM | POA: Diagnosis not present

## 2015-08-27 DIAGNOSIS — L298 Other pruritus: Secondary | ICD-10-CM

## 2015-08-27 MED ORDER — FLUCONAZOLE 150 MG PO TABS: 150.0000 mg | ORAL_TABLET | Freq: Once | ORAL | Status: DC

## 2015-08-27 MED ORDER — KLOR-CON M20 20 MEQ PO TBCR: 20.0000 meq | EXTENDED_RELEASE_TABLET | Freq: Two times a day (BID) | ORAL | Status: DC

## 2015-08-27 NOTE — Assessment & Plan Note (Addendum)
Controlled on current meds CMET normal I will increase her potassium supplement to 20 meq BID

## 2015-08-27 NOTE — Assessment & Plan Note (Signed)
Continue to follow with Dr. Gherghe 

## 2015-08-27 NOTE — Patient Instructions (Signed)
Health Maintenance, Female Adopting a healthy lifestyle and getting preventive care can go a long way to promote health and wellness. Talk with your health care provider about what schedule of regular examinations is right for you. This is a good chance for you to check in with your provider about disease prevention and staying healthy. In between checkups, there are plenty of things you can do on your own. Experts have done a lot of research about which lifestyle changes and preventive measures are most likely to keep you healthy. Ask your health care provider for more information. WEIGHT AND DIET  Eat a healthy diet  Be sure to include plenty of vegetables, fruits, low-fat dairy products, and lean protein.  Do not eat a lot of foods high in solid fats, added sugars, or salt.  Get regular exercise. This is one of the most important things you can do for your health.  Most adults should exercise for at least 150 minutes each week. The exercise should increase your heart rate and make you sweat (moderate-intensity exercise).  Most adults should also do strengthening exercises at least twice a week. This is in addition to the moderate-intensity exercise.  Maintain a healthy weight  Body mass index (BMI) is a measurement that can be used to identify possible weight problems. It estimates body fat based on height and weight. Your health care provider can help determine your BMI and help you achieve or maintain a healthy weight.  For females 20 years of age and older:   A BMI below 18.5 is considered underweight.  A BMI of 18.5 to 24.9 is normal.  A BMI of 25 to 29.9 is considered overweight.  A BMI of 30 and above is considered obese.  Watch levels of cholesterol and blood lipids  You should start having your blood tested for lipids and cholesterol at 36 years of age, then have this test every 5 years.  You may need to have your cholesterol levels checked more often if:  Your lipid  or cholesterol levels are high.  You are older than 36 years of age.  You are at high risk for heart disease.  CANCER SCREENING   Lung Cancer  Lung cancer screening is recommended for adults 55-80 years old who are at high risk for lung cancer because of a history of smoking.  A yearly low-dose CT scan of the lungs is recommended for people who:  Currently smoke.  Have quit within the past 15 years.  Have at least a 30-pack-year history of smoking. A pack year is smoking an average of one pack of cigarettes a day for 1 year.  Yearly screening should continue until it has been 15 years since you quit.  Yearly screening should stop if you develop a health problem that would prevent you from having lung cancer treatment.  Breast Cancer  Practice breast self-awareness. This means understanding how your breasts normally appear and feel.  It also means doing regular breast self-exams. Let your health care provider know about any changes, no matter how small.  If you are in your 20s or 30s, you should have a clinical breast exam (CBE) by a health care provider every 1-3 years as part of a regular health exam.  If you are 40 or older, have a CBE every year. Also consider having a breast X-ray (mammogram) every year.  If you have a family history of breast cancer, talk to your health care provider about genetic screening.  If you   are at high risk for breast cancer, talk to your health care provider about having an MRI and a mammogram every year.  Breast cancer gene (BRCA) assessment is recommended for women who have family members with BRCA-related cancers. BRCA-related cancers include:  Breast.  Ovarian.  Tubal.  Peritoneal cancers.  Results of the assessment will determine the need for genetic counseling and BRCA1 and BRCA2 testing. Cervical Cancer Your health care provider may recommend that you be screened regularly for cancer of the pelvic organs (ovaries, uterus, and  vagina). This screening involves a pelvic examination, including checking for microscopic changes to the surface of your cervix (Pap test). You may be encouraged to have this screening done every 3 years, beginning at age 21.  For women ages 30-65, health care providers may recommend pelvic exams and Pap testing every 3 years, or they may recommend the Pap and pelvic exam, combined with testing for human papilloma virus (HPV), every 5 years. Some types of HPV increase your risk of cervical cancer. Testing for HPV may also be done on women of any age with unclear Pap test results.  Other health care providers may not recommend any screening for nonpregnant women who are considered low risk for pelvic cancer and who do not have symptoms. Ask your health care provider if a screening pelvic exam is right for you.  If you have had past treatment for cervical cancer or a condition that could lead to cancer, you need Pap tests and screening for cancer for at least 20 years after your treatment. If Pap tests have been discontinued, your risk factors (such as having a new sexual partner) need to be reassessed to determine if screening should resume. Some women have medical problems that increase the chance of getting cervical cancer. In these cases, your health care provider may recommend more frequent screening and Pap tests. Colorectal Cancer  This type of cancer can be detected and often prevented.  Routine colorectal cancer screening usually begins at 36 years of age and continues through 36 years of age.  Your health care provider may recommend screening at an earlier age if you have risk factors for colon cancer.  Your health care provider may also recommend using home test kits to check for hidden blood in the stool.  A small camera at the end of a tube can be used to examine your colon directly (sigmoidoscopy or colonoscopy). This is done to check for the earliest forms of colorectal  cancer.  Routine screening usually begins at age 50.  Direct examination of the colon should be repeated every 5-10 years through 36 years of age. However, you may need to be screened more often if early forms of precancerous polyps or small growths are found. Skin Cancer  Check your skin from head to toe regularly.  Tell your health care provider about any new moles or changes in moles, especially if there is a change in a mole's shape or color.  Also tell your health care provider if you have a mole that is larger than the size of a pencil eraser.  Always use sunscreen. Apply sunscreen liberally and repeatedly throughout the day.  Protect yourself by wearing long sleeves, pants, a wide-brimmed hat, and sunglasses whenever you are outside. HEART DISEASE, DIABETES, AND HIGH BLOOD PRESSURE   High blood pressure causes heart disease and increases the risk of stroke. High blood pressure is more likely to develop in:  People who have blood pressure in the high end   of the normal range (130-139/85-89 mm Hg).  People who are overweight or obese.  People who are African American.  If you are 77-50 years of age, have your blood pressure checked every 3-5 years. If you are 19 years of age or older, have your blood pressure checked every year. You should have your blood pressure measured twice--once when you are at a hospital or clinic, and once when you are not at a hospital or clinic. Record the average of the two measurements. To check your blood pressure when you are not at a hospital or clinic, you can use:  An automated blood pressure machine at a pharmacy.  A home blood pressure monitor.  If you are between 35 years and 53 years old, ask your health care provider if you should take aspirin to prevent strokes.  Have regular diabetes screenings. This involves taking a blood sample to check your fasting blood sugar level.  If you are at a normal weight and have a low risk for diabetes,  have this test once every three years after 36 years of age.  If you are overweight and have a high risk for diabetes, consider being tested at a younger age or more often. PREVENTING INFECTION  Hepatitis B  If you have a higher risk for hepatitis B, you should be screened for this virus. You are considered at high risk for hepatitis B if:  You were born in a country where hepatitis B is common. Ask your health care provider which countries are considered high risk.  Your parents were born in a high-risk country, and you have not been immunized against hepatitis B (hepatitis B vaccine).  You have HIV or AIDS.  You use needles to inject street drugs.  You live with someone who has hepatitis B.  You have had sex with someone who has hepatitis B.  You get hemodialysis treatment.  You take certain medicines for conditions, including cancer, organ transplantation, and autoimmune conditions. Hepatitis C  Blood testing is recommended for:  Everyone born from [birth year] through 1967.  Anyone with known risk factors for hepatitis C. Sexually transmitted infections (STIs)  You should be screened for sexually transmitted infections (STIs) including gonorrhea and chlamydia if:  You are sexually active and are younger than 36 years of age.  You are older than 36 years of age and your health care provider tells you that you are at risk for this type of infection.  Your sexual activity has changed since you were last screened and you are at an increased risk for chlamydia or gonorrhea. Ask your health care provider if you are at risk.  If you do not have HIV, but are at risk, it may be recommended that you take a prescription medicine daily to prevent HIV infection. This is called pre-exposure prophylaxis (PrEP). You are considered at risk if:  You are sexually active and do not regularly use condoms or know the HIV status of your partner(s).  You take drugs by injection.  You are sexually  active with a partner who has HIV. Talk with your health care provider about whether you are at high risk of being infected with HIV. If you choose to begin PrEP, you should first be tested for HIV. You should then be tested every 3 months for as long as you are taking PrEP.  PREGNANCY   If you are premenopausal and you may become pregnant, ask your health care provider about preconception counseling.  If you may  become pregnant, take 400 to 800 micrograms (mcg) of folic acid every day.  If you want to prevent pregnancy, talk to your health care provider about birth control (contraception). OSTEOPOROSIS AND MENOPAUSE   Osteoporosis is a disease in which the bones lose minerals and strength with aging. This can result in serious bone fractures. Your risk for osteoporosis can be identified using a bone density scan.  If you are 61 years of age or older, or if you are at risk for osteoporosis and fractures, ask your health care provider if you should be screened.  Ask your health care provider whether you should take a calcium or vitamin D supplement to lower your risk for osteoporosis.  Menopause may have certain physical symptoms and risks.  Hormone replacement therapy may reduce some of these symptoms and risks. Talk to your health care provider about whether hormone replacement therapy is right for you.  HOME CARE INSTRUCTIONS   Schedule regular health, dental, and eye exams.  Stay current with your immunizations.   Do not use any tobacco products including cigarettes, chewing tobacco, or electronic cigarettes.  If you are pregnant, do not drink alcohol.  If you are breastfeeding, limit how much and how often you drink alcohol.  Limit alcohol intake to no more than 1 drink per day for nonpregnant women. One drink equals 12 ounces of beer, 5 ounces of wine, or 1 ounces of hard liquor.  Do not use street drugs.  Do not share needles.  Ask your health care provider for help if  you need support or information about quitting drugs.  Tell your health care provider if you often feel depressed.  Tell your health care provider if you have ever been abused or do not feel safe at home.   This information is not intended to replace advice given to you by your health care provider. Make sure you discuss any questions you have with your health care provider.   Document Released: 03/20/2011 Document Revised: 09/25/2014 Document Reviewed: 08/06/2013 [employer] Interactive Patient Education [employer].

## 2015-08-27 NOTE — Progress Notes (Signed)
Subjective:    Patient ID: Debbie Walter, female    DOB: 1981-07-17, 34 y.o.   MRN: [id number]  HPI  Pt presents to the clinic today for her annual exam. She is also due for follow up of chronic conditions, see separate note.  Flu: 06/2015 at work Tetanus: 02/2014 Pap Smear: 06/2014 Dentist: biannually  Diet: She does eat meat. She does consumes fruits a few times per week. She does not really like vegetables. She does consume fried foods. She drinks mostly water. Exercise: She is not exercising.  Review of Systems      Past Medical History  Diagnosis Date  . Panic attacks   . Hypertension   . Fibroids   . PCOS (polycystic ovarian syndrome)     takes metformin for this  . Kidney stones 5/14  . GERD (gastroesophageal reflux disease)     takes protonix    Current Outpatient Prescriptions  Medication Sig Dispense Refill  . ALPRAZolam (XANAX) 0.25 MG tablet Take 1 tablet (0.25 mg total) by mouth daily as needed. 30 tablet 0  . amoxicillin (AMOXIL) 500 MG capsule Take 1 capsule (500 mg total) by mouth 3 (three) times daily. 30 capsule 0  . busPIRone (BUSPAR) 5 MG tablet Take 1 tablet (5 mg total) by mouth daily. 30 tablet 2  . chlorthalidone (HYGROTON) 25 MG tablet Take 1 tablet (25 mg total) by mouth daily. 90 tablet 0  . cholecalciferol (VITAMIN D) 1000 UNITS tablet Take 1,000 Units by mouth every other day.    . fluconazole (DIFLUCAN) 150 MG tablet Take 1 tablet (150 mg total) by mouth once. 2 tablet 0  . glucose blood (ONETOUCH VERIO) test strip Use to test blood sugar 1 time daily as instructed. 100 each 3  . HYDROcodone-homatropine (HYCODAN) 5-1.5 MG/5ML syrup Take 5 mLs by mouth every 8 (eight) hours as needed for cough. 120 mL 0  . ibuprofen (ADVIL,MOTRIN) 600 MG tablet Take 1 tablet (600 mg total) by mouth every 6 (six) hours. 30 tablet 0  . KLOR-CON M20 20 MEQ tablet Take 1 tablet (20 mEq total) by mouth daily. 90 tablet 0  . metFORMIN (GLUCOPHAGE) 500 MG tablet  Take 1 tablet (500 mg total) by mouth 2 (two) times daily with a meal. 180 tablet 3  . metoprolol (LOPRESSOR) 50 MG tablet Take 1 tablet (50 mg total) by mouth 2 (two) times daily. 180 tablet 0  . ONETOUCH DELICA LANCETS FINE MISC Use to test blood sugar 1 time daily 100 each 3  . pantoprazole (PROTONIX) 40 MG tablet Take 1 tablet (40 mg total) by mouth daily. 90 tablet 0  . Prenat w/o A-FeCbGl-DSS-FA-DHA (CITRANATAL ASSURE) 35-1 & 300 MG tablet Take 1 tablet by mouth daily.  11  . valsartan (DIOVAN) 80 MG tablet Take 1 tablet (80 mg total) by mouth daily. 90 tablet 0   No current facility-administered medications for this visit.    Allergies  Allergen Reactions  . Procardia [Nifedipine] Other (See Comments)    headache  . Wellbutrin [Bupropion] Other (See Comments)    "Makes my blood pressure go up - I don't ever want to take it again"  . Labetalol Other (See Comments)    "makes me feel like things are crawling through my head"  . Zithromax [Azithromycin] Other (See Comments)    Gi upset.  Diarrhea    Family History  Problem Relation Age of Onset  . Anesthesia problems Neg Hx   . Hypotension Neg Hx   .  Malignant hyperthermia Neg Hx   . Pseudochol deficiency Neg Hx   . Hyperlipidemia Mother   . Heart disease Mother   . Diabetes Mother   . Hypertension Mother   . Hyperlipidemia Father   . Hypertension Father   . Cancer Father     rectal  . Hypertension Sister     Social History   Social History  . Marital Status: Married    Spouse Name: N/A  . Number of Children: N/A  . Years of Education: N/A   Occupational History  . Not on file.   Social History Main Topics  . Smoking status: Never Smoker   . Smokeless tobacco: Never Used  . Alcohol Use: No  . Drug Use: No  . Sexual Activity: Yes    Birth Control/ Protection: None   Other Topics Concern  . Not on file   Social History Narrative     Constitutional: Pt reports weight gain. Denies fever, malaise,  fatigue, headache.  HEENT: Denies eye pain, eye redness, ear pain, ringing in the ears, wax buildup, runny nose, nasal congestion, bloody nose, or sore throat. Respiratory: Denies difficulty breathing, shortness of breath, cough or sputum production.   Cardiovascular: Denies chest pain, chest tightness, palpitations or swelling in the hands or feet.  Gastrointestinal: Denies abdominal pain, bloating, constipation, diarrhea or blood in the stool.  GU: Pt reports vaginal itching. Denies urgency, frequency, pain with urination, burning sensation, blood in urine, odor or discharge. Musculoskeletal: Denies decrease in range of motion, difficulty with gait, muscle pain or joint pain and swelling.  Skin: Denies redness, rashes, lesions or ulcercations.  Neurological: Denies dizziness, difficulty with memory, difficulty with speech or problems with balance and coordination.  Psych: Pt reports anxiety. Denies depression, SI/HI.  No other specific complaints in a complete review of systems (except as listed in HPI above).  Objective:   Physical Exam   BP 120/84 mmHg  Pulse 82  Temp(Src) 98.5 F (36.9 C) (Oral)  Ht 5' 1.5" (1.562 m)  Wt 198 lb 8 oz (90.039 kg)  BMI 36.90 kg/m2  SpO2 98%  LMP 08/16/2015 Wt Readings from Last 3 Encounters:  08/27/15 198 lb 8 oz (90.039 kg)  08/03/15 201 lb (91.173 kg)  07/16/15 201 lb (91.173 kg)    General: Appears her stated age, obese in NAD. Skin: Warm, dry and intact. No rashes, lesions or ulcerations noted. HEENT: Head: normal shape and size; Eyes: sclera white, no icterus, conjunctiva pink, PERRLA and EOMs intact; Ears: Tm's gray and intact, normal light reflex; Throat/Mouth: Teeth present, mucosa pink and moist, no exudate, lesions or ulcerations noted.  Neck:  Neck supple, trachea midline. No masses, lumps or thyromegaly present.  Cardiovascular: Normal rate and rhythm. S1,S2 noted.  No murmur, rubs or gallops noted. No JVD or BLE edema. No carotid  bruits noted. Pulmonary/Chest: Normal effort and positive vesicular breath sounds. No respiratory distress. No wheezes, rales or ronchi noted.  Abdomen: Soft and nontender. Normal bowel sounds. No distention or masses noted. Liver, spleen and kidneys non palpable. Musculoskeletal: Strength 5/5 BUE/BLE. No signs of joint swelling. No difficulty with gait.  Neurological: Alert and oriented. Cranial nerves II-XII grossly intact. Coordination normal.  Psychiatric: She is mildly anxious today.    BMET    Component Value Date/Time   NA 140 08/20/2015 1425   K 3.0* 08/20/2015 1425   CL 103 08/20/2015 1425   CO2 27 08/20/2015 1425   GLUCOSE 89 08/20/2015 1425   BUN 9  08/20/2015 1425   CREATININE 0.69 08/20/2015 1425   CREATININE 0.76 02/26/2015 1654   CREATININE 0.47* 04/28/2014 2340   CALCIUM 10.1 08/20/2015 1425   GFRNONAA >90 04/29/2014 0513   GFRAA >90 04/29/2014 0513    Lipid Panel     Component Value Date/Time   CHOL 154 08/20/2015 1425   TRIG 157.0* 08/20/2015 1425   HDL 35.20* 08/20/2015 1425   CHOLHDL 4 08/20/2015 1425   VLDL 31.4 08/20/2015 1425   LDLCALC 88 08/20/2015 1425    CBC    Component Value Date/Time   WBC 8.7 08/20/2015 1425   RBC 4.75 08/20/2015 1425   HGB 12.4 08/20/2015 1425   HCT 38.5 08/20/2015 1425   PLT 324.0 08/20/2015 1425   MCV 81.1 08/20/2015 1425   MCH 27.3 05/16/2014 0548   MCHC 32.2 08/20/2015 1425   RDW 15.0 08/20/2015 1425   LYMPHSABS 2.0 08/20/2015 1425   MONOABS 0.4 08/20/2015 1425   EOSABS 0.2 08/20/2015 1425   BASOSABS 0.0 08/20/2015 1425    Hgb A1C Lab Results  Component Value Date   HGBA1C 5.7 07/16/2015        Assessment & Plan:   Preventative Health Maintenance:  Flu and Tetanus UTD Encouraged her to consume a balanced diet and start an exercise regimen Encouraged her to continue seeing a dentist at least yearly Pap Smear due 2018 CBC, CMET, TSH and Lipid reviewed- slight diuretic induced hypokalemia (she is on  potassium supplement  RTC in 6 months to follow up chronic conditions  HPI:  Pt presents to the clinic today to for follow up of chronic conditions:  GAD: Controlled on Buspar. She will have to stop the Buspar 3 weeks before her imaging. She takes Xanax only as needed.  GERD: Symptoms well controlled on Protonix.  Hyperparathyroidism: Her last PTH was normal but calcium was elevated. She has persistantly high levels of metanephrines. Dr. Cruzita Lederer has ordered some imaging to rule out pheochromocytoma/paraganglioma.  HTN: BP well controlled on Valsartan, Chlorthalidone and Metoprolol. Her BP today is 120/84.  PCOS: Her last A1C was 5.7. She is on Metformin 500 mg daily. She follows with Dr. Cruzita Lederer.  She also c/o vaginal itching. This has been going on for a week and a half. She reports she has recently been on antibiotics. She would like a refill of Diflucan today.  Review of Systems:   Past Medical History  Diagnosis Date  . Panic attacks   . Hypertension   . Fibroids   . PCOS (polycystic ovarian syndrome)     takes metformin for this  . Kidney stones 5/14  . GERD (gastroesophageal reflux disease)     takes protonix    Current Outpatient Prescriptions  Medication Sig Dispense Refill  . ALPRAZolam (XANAX) 0.25 MG tablet Take 1 tablet (0.25 mg total) by mouth daily as needed. 30 tablet 0  . amoxicillin (AMOXIL) 500 MG capsule Take 1 capsule (500 mg total) by mouth 3 (three) times daily. 30 capsule 0  . busPIRone (BUSPAR) 5 MG tablet Take 1 tablet (5 mg total) by mouth daily. 30 tablet 2  . chlorthalidone (HYGROTON) 25 MG tablet Take 1 tablet (25 mg total) by mouth daily. 90 tablet 0  . cholecalciferol (VITAMIN D) 1000 UNITS tablet Take 1,000 Units by mouth every other day.    . fluconazole (DIFLUCAN) 150 MG tablet Take 1 tablet (150 mg total) by mouth once. 2 tablet 0  . glucose blood (ONETOUCH VERIO) test strip Use to test blood  sugar 1 time daily as instructed. 100 each 3    . HYDROcodone-homatropine (HYCODAN) 5-1.5 MG/5ML syrup Take 5 mLs by mouth every 8 (eight) hours as needed for cough. 120 mL 0  . ibuprofen (ADVIL,MOTRIN) 600 MG tablet Take 1 tablet (600 mg total) by mouth every 6 (six) hours. 30 tablet 0  . KLOR-CON M20 20 MEQ tablet Take 1 tablet (20 mEq total) by mouth daily. 90 tablet 0  . metFORMIN (GLUCOPHAGE) 500 MG tablet Take 1 tablet (500 mg total) by mouth 2 (two) times daily with a meal. 180 tablet 3  . metoprolol (LOPRESSOR) 50 MG tablet Take 1 tablet (50 mg total) by mouth 2 (two) times daily. 180 tablet 0  . ONETOUCH DELICA LANCETS FINE MISC Use to test blood sugar 1 time daily 100 each 3  . pantoprazole (PROTONIX) 40 MG tablet Take 1 tablet (40 mg total) by mouth daily. 90 tablet 0  . Prenat w/o A-FeCbGl-DSS-FA-DHA (CITRANATAL ASSURE) 35-1 & 300 MG tablet Take 1 tablet by mouth daily.  11  . valsartan (DIOVAN) 80 MG tablet Take 1 tablet (80 mg total) by mouth daily. 90 tablet 0   No current facility-administered medications for this visit.    Allergies  Allergen Reactions  . Procardia [Nifedipine] Other (See Comments)    headache  . Wellbutrin [Bupropion] Other (See Comments)    "Makes my blood pressure go up - I don't ever want to take it again"  . Labetalol Other (See Comments)    "makes me feel like things are crawling through my head"  . Zithromax [Azithromycin] Other (See Comments)    Gi upset.  Diarrhea    Family History  Problem Relation Age of Onset  . Anesthesia problems Neg Hx   . Hypotension Neg Hx   . Malignant hyperthermia Neg Hx   . Pseudochol deficiency Neg Hx   . Hyperlipidemia Mother   . Heart disease Mother   . Diabetes Mother   . Hypertension Mother   . Hyperlipidemia Father   . Hypertension Father   . Cancer Father     rectal  . Hypertension Sister     Social History   Social History  . Marital Status: Married    Spouse Name: N/A  . Number of Children: N/A  . Years of Education: N/A    Occupational History  . Not on file.   Social History Main Topics  . Smoking status: Never Smoker   . Smokeless tobacco: Never Used  . Alcohol Use: No  . Drug Use: No  . Sexual Activity: Yes    Birth Control/ Protection: None   Other Topics Concern  . Not on file   Social History Narrative     Constitutional: Pt reports weight gain. Denies fever, malaise, fatigue, headache.  HEENT: Denies eye pain, eye redness, ear pain, ringing in the ears, wax buildup, runny nose, nasal congestion, bloody nose, or sore throat. Respiratory: Denies difficulty breathing, shortness of breath, cough or sputum production.   Cardiovascular: Denies chest pain, chest tightness, palpitations or swelling in the hands or feet.  Gastrointestinal: Denies abdominal pain, bloating, constipation, diarrhea or blood in the stool.  GU: Pt reports vaginal itching. Denies urgency, frequency, pain with urination, burning sensation, blood in urine, odor or discharge. Musculoskeletal: Denies decrease in range of motion, difficulty with gait, muscle pain or joint pain and swelling.  Skin: Denies redness, rashes, lesions or ulcercations.  Neurological: Denies dizziness, difficulty with memory, difficulty with speech or problems with  balance and coordination.  Psych: Pt reports anxiety. Denies depression, SI/HI.  No other specific complaints in a complete review of systems (except as listed in HPI above).  Objective:  BP 120/84 mmHg  Pulse 82  Temp(Src) 98.5 F (36.9 C) (Oral)  Ht 5' 1.5" (1.562 m)  Wt 198 lb 8 oz (90.039 kg)  BMI 36.90 kg/m2  SpO2 98%  LMP 08/16/2015  General: Appears her stated age, obese in NAD. Cardiovascular: Normal rate and rhythm. S1,S2 noted.  No murmur, rubs or gallops noted. No JVD or BLE edema. No carotid bruits noted. Pulmonary/Chest: Normal effort and positive vesicular breath sounds. No respiratory distress. No wheezes, rales or ronchi noted.  Abdomen: Soft and nontender. Normal  bowel sounds. No distention or masses noted. Liver, spleen and kidneys non palpable. Neurological: Alert and oriented. Cranial nerves II-XII grossly intact. Coordination normal.  Psychiatric: She is mildly anxious today.  Assessment and Plan:  Vaginal itching:  Urinalysis normal Diflucan refilled

## 2015-08-27 NOTE — Assessment & Plan Note (Signed)
Continue Metformin Continue to follow with Dr. Cruzita Lederer

## 2015-08-27 NOTE — Assessment & Plan Note (Signed)
She will continue to follow with Dr. Cruzita Lederer

## 2015-08-27 NOTE — Progress Notes (Signed)
Pre visit review using our clinic review tool, if applicable. No additional management support is needed unless otherwise documented below in the visit note. 

## 2015-08-27 NOTE — Assessment & Plan Note (Signed)
Controlled on Protonix

## 2015-08-27 NOTE — Assessment & Plan Note (Signed)
Continue Buspar and Xanax

## 2015-08-31 ENCOUNTER — Telehealth: Payer: Self-pay | Admitting: Internal Medicine

## 2015-08-31 MED ORDER — IODINE STRONG (LUGOL'S) SOLN: Status: DC

## 2015-08-31 NOTE — Telephone Encounter (Signed)
Please read message below and advise.  

## 2015-08-31 NOTE — Telephone Encounter (Signed)
Patient called stating that she needs an Rx called into her pharmacy   Rx: Drops for the RAI treatment   Pharmacy: [facility]

## 2015-08-31 NOTE — Telephone Encounter (Signed)
Sent Lugol's solution to pt's pharmacy.

## 2015-08-31 NOTE — Telephone Encounter (Signed)
OK 

## 2015-09-01 ENCOUNTER — Telehealth: Payer: Self-pay | Admitting: Internal Medicine

## 2015-09-01 NOTE — Telephone Encounter (Signed)
Patient stated that her pharmacy [facility] has the medication Strong Iodine 5 % solution, they stated that it was the generic for medication Lugol, and if that that is true she will get it there, if not she will get it at [facility], please advise

## 2015-09-01 NOTE — Telephone Encounter (Signed)
Called pt and lvm advising her ok to use the generic, strongs iodine solution.

## 2015-09-06 ENCOUNTER — Telehealth: Payer: Self-pay

## 2015-09-06 NOTE — Telephone Encounter (Signed)
Pt left v/m; pt was seen at [facility] about chest pain on 09/02/15; had EKG and P wave was abnormal and was going to send EKG to R Baity NP to determine if new find or does pt need further testing or referral. Pt request cb.

## 2015-09-07 NOTE — Telephone Encounter (Signed)
I do not have in my paperwork and do not see it in the front---so not sure if it has been sent

## 2015-09-07 NOTE — Telephone Encounter (Signed)
Where is the EKG for review?

## 2015-09-09 ENCOUNTER — Encounter (HOSPITAL_COMMUNITY)
Admission: RE | Admit: 2015-09-09 | Discharge: 2015-09-09 | Disposition: A | Discharge status: Home / Self Care | Payer: Federal, State, Local not specified - PPO | Source: Ambulatory Visit | Attending: Internal Medicine | Admitting: Internal Medicine

## 2015-09-09 DIAGNOSIS — R825 Elevated urine levels of drugs, medicaments and biological substances: Secondary | ICD-10-CM | POA: Insufficient documentation

## 2015-09-09 MED ORDER — IOBENGUANE SULFATE I 123 10 MCI/5ML IV SOLN: 10.8000 | Freq: Once | INTRAVENOUS | Status: DC | PRN

## 2015-09-10 ENCOUNTER — Encounter (HOSPITAL_COMMUNITY)
Admission: RE | Admit: 2015-09-10 | Discharge: 2015-09-10 | Disposition: A | Discharge status: Home / Self Care | Payer: Federal, State, Local not specified - PPO | Source: Ambulatory Visit | Attending: Internal Medicine | Admitting: Internal Medicine

## 2015-09-10 DIAGNOSIS — R825 Elevated urine levels of drugs, medicaments and biological substances: Secondary | ICD-10-CM | POA: Diagnosis not present

## 2015-09-10 NOTE — Telephone Encounter (Signed)
ECG is unchanged from her previous one. She does not need to see a cardiologist.

## 2015-09-10 NOTE — Telephone Encounter (Signed)
Spoke to pt, she wanted to know if we had evaluated the EKG, explained that we had not received the EKG from Urgent Care.  Spoke with Miranda at [facility], they had not sent EKG yet. Miranda will send to my attention so can be routed to PCP.  Pt requests callback today at [phone] / lt

## 2015-09-10 NOTE — Telephone Encounter (Signed)
Pt is aware and expressed understanding 

## 2015-09-14 NOTE — Addendum Note (Signed)
Addended by: Philemon Kingdom on: 09/14/2015 08:21 AM   Modules accepted: Orders

## 2015-09-17 ENCOUNTER — Telehealth: Payer: Self-pay | Admitting: Internal Medicine

## 2015-09-17 NOTE — Telephone Encounter (Signed)
Called pt and advised her of her result per Dr Arman Filter MyChart message to pt. Pt pleased with results and ok to move forward with the U/S of the thyroid. Be advised.

## 2015-09-17 NOTE — Telephone Encounter (Signed)
Patient is calling for the results of her Scan.

## 2015-09-20 ENCOUNTER — Other Ambulatory Visit: Payer: Self-pay | Admitting: Internal Medicine

## 2015-09-22 ENCOUNTER — Telehealth: Payer: Self-pay | Admitting: Internal Medicine

## 2015-09-22 ENCOUNTER — Other Ambulatory Visit: Payer: Self-pay | Admitting: Internal Medicine

## 2015-09-22 DIAGNOSIS — E0590 Thyrotoxicosis, unspecified without thyrotoxic crisis or storm: Secondary | ICD-10-CM | POA: Insufficient documentation

## 2015-09-22 NOTE — Telephone Encounter (Signed)
Spoke with patient. She wants to know what does it mean that her thyroid is a little intense. Please advise.

## 2015-09-22 NOTE — Telephone Encounter (Signed)
Yes, I reviewed the report, and I just ordered a thyroid ultrasound to further characterize the activity in her left thyroid lobe.

## 2015-09-22 NOTE — Telephone Encounter (Signed)
The L thyroid picked up more MIBG ( iodine) that it should have. The Lugol is given before the scan to avoid this. If she did not take the Lugol, it was normal for the reagent to go to the thyroid. If she did, it tells me that the L thyroid overproduces hormones or it contains a more active nodule. That is why we need to Thyroid U/S - to clarify.

## 2015-09-22 NOTE — Telephone Encounter (Signed)
Called pt and advised her Dr Arman Filter message. Pt voiced understanding. Pt to have U/S on Jan 13th. Be advised.

## 2015-09-22 NOTE — Telephone Encounter (Signed)
Patient stated that she had a scan done at [facility] of her thyroid, the doctor said her thyroid was intense, she would like to know what that mean, please advise.  She is working from home, if she can't answer the phone feel free leave a detailed message with husband.

## 2015-09-22 NOTE — Telephone Encounter (Signed)
Please read message below and advise.  

## 2015-10-01 ENCOUNTER — Ambulatory Visit
Admission: RE | Admit: 2015-10-01 | Discharge: 2015-10-01 | Disposition: A | Discharge status: Home / Self Care | Payer: Federal, State, Local not specified - PPO | Source: Ambulatory Visit | Attending: Internal Medicine | Admitting: Internal Medicine

## 2015-10-01 ENCOUNTER — Telehealth: Payer: Self-pay | Admitting: Internal Medicine

## 2015-10-01 NOTE — Telephone Encounter (Signed)
Pt would like to go over last labs with you. She has a low hemoglobin and wants to know if her labs were low last time  [phone] Thank you

## 2015-10-01 NOTE — Telephone Encounter (Signed)
Please call patient about her lab results.

## 2015-10-02 ENCOUNTER — Other Ambulatory Visit: Payer: Self-pay | Admitting: Internal Medicine

## 2015-10-04 NOTE — Telephone Encounter (Signed)
Her last CBC I did was completely normal. I think a fingerstick is fine.

## 2015-10-04 NOTE — Telephone Encounter (Signed)
Pt reports she went to her GYN for annual visit---they did a Hemoglobin finger stick and was 9.2---pt states she had her menstrual 3 days prior and was not too heavy---pt has started vitamin b12 500mg  QD and iron supplent--pt stated GYN wants her to come back for recheck in 6 weeks--(fingerstick)---pt wants to know if she should have actual CBC drawn and should she f/u sooner as she wants more information and maybe to zero in on what kind of anemia she may have---please advise

## 2015-10-04 NOTE — Telephone Encounter (Signed)
Left detailed msg on VM per HIPAA  

## 2015-10-05 ENCOUNTER — Telehealth: Payer: Self-pay

## 2015-10-05 ENCOUNTER — Other Ambulatory Visit: Payer: Self-pay | Admitting: Internal Medicine

## 2015-10-05 NOTE — Telephone Encounter (Signed)
Her last CBC here was completely normal. No need to run anemia panel or she can have labs sent over for me to review

## 2015-10-05 NOTE — Telephone Encounter (Signed)
Pt left v/m pt was seen at [facility] on 10/01/15 and was told hgb was 9.2; in 08/2015 was 12.4, pt request anemia panel to decide what type anemia pt has; pt would like to have lab test done by 10/06/15. Pt request cb.

## 2015-10-05 NOTE — Telephone Encounter (Signed)
See previous note---spoke to pt yesterday and lmovm

## 2015-10-14 ENCOUNTER — Other Ambulatory Visit: Payer: Self-pay | Admitting: Internal Medicine

## 2015-10-18 ENCOUNTER — Ambulatory Visit (INDEPENDENT_AMBULATORY_CARE_PROVIDER_SITE_OTHER): Payer: Federal, State, Local not specified - PPO | Admitting: Primary Care

## 2015-10-18 ENCOUNTER — Encounter: Payer: Self-pay | Admitting: Primary Care

## 2015-10-18 VITALS — BP 118/78 | HR 92 | Temp 98.2°F | Wt 204.1 lb

## 2015-10-18 DIAGNOSIS — R5383 Other fatigue: Secondary | ICD-10-CM

## 2015-10-18 LAB — CBC
HCT: 39.2 % (ref 36.0–46.0)
HEMOGLOBIN: 12.7 g/dL (ref 12.0–15.0)
MCHC: 32.5 g/dL (ref 30.0–36.0)
MCV: 80.8 fl (ref 78.0–100.0)
PLATELETS: 322 10*3/uL (ref 150.0–400.0)
RBC: 4.85 Mil/uL (ref 3.87–5.11)
RDW: 15.6 % — ABNORMAL HIGH (ref 11.5–15.5)
WBC: 9.9 10*3/uL (ref 4.0–10.5)

## 2015-10-18 LAB — IBC PANEL
Iron: 52 ug/dL (ref 42–145)
SATURATION RATIOS: 11.6 % — AB (ref 20.0–50.0)
Transferrin: 319 mg/dL (ref 212.0–360.0)

## 2015-10-18 LAB — BASIC METABOLIC PANEL
BUN: 11 mg/dL (ref 6–23)
CHLORIDE: 101 meq/L (ref 96–112)
CO2: 28 mEq/L (ref 19–32)
Calcium: 10.3 mg/dL (ref 8.4–10.5)
Creatinine, Ser: 0.85 mg/dL (ref 0.40–1.20)
GFR: 97.09 mL/min (ref >60.00)
GLUCOSE: 98 mg/dL (ref 70–99)
POTASSIUM: 3.7 meq/L (ref 3.5–5.1)
Sodium: 139 mEq/L (ref 135–145)

## 2015-10-18 LAB — FERRITIN: Ferritin: 18.9 ng/mL (ref 10.0–291.0)

## 2015-10-18 NOTE — Patient Instructions (Addendum)
Complete lab work prior to leaving today. I will notify you of your results once received.   I encourage you to call your GYN if the iron tablets continue to cause GI upset.  It was a pleasure meeting you!

## 2015-10-18 NOTE — Progress Notes (Signed)
Pre visit review using our clinic review tool, if applicable. No additional management support is needed unless otherwise documented below in the visit note. 

## 2015-10-18 NOTE — Progress Notes (Signed)
Subjective:    Patient ID: Debbie Walter, female    DOB: [birth year], 35 y.o.   MRN: [id number]  HPI  Debbie Walter is a 35 year old female who presents today with a chief complaint of epigastric pain. She also reports nausea, abdominal bloating, and fatigue. She is managed on iron per GYN and pantoprazole 40 mg. She was evaluated at GYN office on January 13th and placed iron due to "low levels". Her symptoms began over the past weekend. Denies vomiting, constipation, diarrhea, blood in stools, dysuria, fevers. Her abdominal bloating and epigastric discomfort began after taking oral iron. She's also feeling anxious as she believes she may be internally bleeding.  Review of Systems  Constitutional: Positive for fatigue. Negative for fever and chills.  Gastrointestinal: Positive for nausea. Negative for vomiting, abdominal pain, diarrhea, constipation and abdominal distention.  Neurological: Positive for light-headedness. Negative for dizziness.       Past Medical History  Diagnosis Date  . Panic attacks   . Hypertension   . Fibroids   . PCOS (polycystic ovarian syndrome)     takes metformin for this  . Kidney stones 5/14  . GERD (gastroesophageal reflux disease)     takes protonix    Social History   Social History  . Marital Status: Married    Spouse Name: N/A  . Number of Children: N/A  . Years of Education: N/A   Occupational History  . Not on file.   Social History Main Topics  . Smoking status: Never Smoker   . Smokeless tobacco: Never Used  . Alcohol Use: No  . Drug Use: No  . Sexual Activity: Yes    Birth Control/ Protection: None   Other Topics Concern  . Not on file   Social History Narrative    Past Surgical History  Procedure Laterality Date  . Parathyroidectomy    . Cesarean section    . Parathyroidectomy    . Hysteroscopy    . Cesarean section with bilateral tubal ligation Bilateral 05/15/2014    Procedure: CESAREAN SECTION WITH BILATERAL TUBAL  LIGATION;  Surgeon: Cyril Mourning, MD;  Location: [facility];  Service: Obstetrics;  Laterality: Bilateral;  repeat  edc 06/10/14    Family History  Problem Relation Age of Onset  . Anesthesia problems Neg Hx   . Hypotension Neg Hx   . Malignant hyperthermia Neg Hx   . Pseudochol deficiency Neg Hx   . Hyperlipidemia Mother   . Heart disease Mother   . Diabetes Mother   . Hypertension Mother   . Hyperlipidemia Father   . Hypertension Father   . Cancer Father     rectal  . Hypertension Sister     Allergies  Allergen Reactions  . Procardia [Nifedipine] Other (See Comments)    headache  . Wellbutrin [Bupropion] Other (See Comments)    "Makes my blood pressure go up - I don't ever want to take it again"  . Labetalol Other (See Comments)    "makes me feel like things are crawling through my head"  . Zithromax [Azithromycin] Other (See Comments)    Gi upset.  Diarrhea    Current Outpatient Prescriptions on File Prior to Visit  Medication Sig Dispense Refill  . ALPRAZolam (XANAX) 0.25 MG tablet Take 1 tablet (0.25 mg total) by mouth daily as needed. 30 tablet 0  . chlorthalidone (HYGROTON) 25 MG tablet TAKE 1 TABLET BY MOUTH EVERY DAY 90 tablet 0  . cholecalciferol (VITAMIN D) 1000 UNITS  tablet Take 1,000 Units by mouth every other day.    . fluconazole (DIFLUCAN) 150 MG tablet Take 1 tablet (150 mg total) by mouth once. 2 tablet 0  . glucose blood (ONETOUCH VERIO) test strip Use to test blood sugar 1 time daily as instructed. 100 each 3  . ibuprofen (ADVIL,MOTRIN) 600 MG tablet Take 1 tablet (600 mg total) by mouth every 6 (six) hours. (Patient taking differently: Take 600 mg by mouth every 6 (six) hours as needed. ) 30 tablet 0  . Iodine Strong, Lugol's, SOLN Take 1 drop 2 times a day (in water or juice), on day prior to the study and continue for 7 days after, including day of study 15 mL 0  . KLOR-CON M20 20 MEQ tablet Take 1 tablet (20 mEq total) by mouth 2 (two) times daily. 180  tablet 1  . metFORMIN (GLUCOPHAGE) 500 MG tablet Take 1 tablet (500 mg total) by mouth 2 (two) times daily with a meal. 180 tablet 3  . metoprolol (LOPRESSOR) 50 MG tablet TAKE 1 TABLET BY MOUTH TWICE A DAY 180 tablet 0  . ONETOUCH DELICA LANCETS FINE MISC Use to test blood sugar 1 time daily 100 each 3  . pantoprazole (PROTONIX) 40 MG tablet TAKE 1 TABLET BY MOUTH EVERY DAY 90 tablet 0  . Prenat w/o A-FeCbGl-DSS-FA-DHA (CITRANATAL ASSURE) 35-1 & 300 MG tablet Take 1 tablet by mouth daily.  11  . valsartan (DIOVAN) 80 MG tablet TAKE 1 TABLET BY MOUTH EVERY DAY 90 tablet 1  . busPIRone (BUSPAR) 5 MG tablet Take 1 tablet (5 mg total) by mouth daily. (Patient not taking: Reported on 08/27/2015) 30 tablet 2   No current facility-administered medications on file prior to visit.    BP 118/78 mmHg  Pulse 92  Temp(Src) 98.2 F (36.8 C) (Oral)  Wt 204 lb 1.9 oz (92.588 kg)  SpO2 98%  LMP 09/25/2015    Objective:   Physical Exam  Constitutional: She appears well-nourished.  Neck: Neck supple.  Cardiovascular: Normal rate and regular rhythm.   Pulmonary/Chest: Effort normal and breath sounds normal.  Abdominal: Soft. Bowel sounds are normal. There is generalized tenderness. There is no rigidity, no rebound, no tenderness at McBurney's point and negative Murphy's sign.  Skin: Skin is warm and dry. No pallor.  Psychiatric:  Seems anxious today during exam, she thinks she may be internally bleeding.          Assessment & Plan:  Epigastric Discomfort and Fatigue:  Present for past 3 days. Recently treated with oral iron per GYN office for hemoglobin of "9.4". Hemoglobin 12 in December 2016 and is 12 today in office. CMP unremarkable. Iron studies WNL. Hemoccult stool card completed today and is negative. Will have her discuss the iron with GYN as blood counts are stable today, especially if it's causing GI upset. Exam is not suspicious for infectious process or acute abdomen. Suspect  Iron causing GI upset. Follow up with PCP if no improvement in fatigue.

## 2015-10-20 ENCOUNTER — Encounter: Payer: Self-pay | Admitting: *Deleted

## 2015-12-01 ENCOUNTER — Other Ambulatory Visit: Payer: Self-pay | Admitting: Internal Medicine

## 2015-12-02 NOTE — Telephone Encounter (Signed)
Last filled 08/03/15--please advise

## 2015-12-02 NOTE — Telephone Encounter (Signed)
Left refill on voice mail at pharmacy  

## 2015-12-02 NOTE — Telephone Encounter (Signed)
Ok to phone in Xanax 

## 2015-12-14 ENCOUNTER — Ambulatory Visit (INDEPENDENT_AMBULATORY_CARE_PROVIDER_SITE_OTHER): Payer: Federal, State, Local not specified - PPO | Admitting: Family Medicine

## 2015-12-14 ENCOUNTER — Encounter: Payer: Self-pay | Admitting: Family Medicine

## 2015-12-14 VITALS — BP 118/80 | HR 77 | Temp 98.3°F | Wt 208.0 lb

## 2015-12-14 DIAGNOSIS — B9689 Other specified bacterial agents as the cause of diseases classified elsewhere: Secondary | ICD-10-CM

## 2015-12-14 DIAGNOSIS — A499 Bacterial infection, unspecified: Secondary | ICD-10-CM

## 2015-12-14 DIAGNOSIS — R059 Cough, unspecified: Secondary | ICD-10-CM

## 2015-12-14 DIAGNOSIS — R6883 Chills (without fever): Secondary | ICD-10-CM | POA: Diagnosis not present

## 2015-12-14 DIAGNOSIS — R05 Cough: Secondary | ICD-10-CM | POA: Diagnosis not present

## 2015-12-14 DIAGNOSIS — J329 Chronic sinusitis, unspecified: Secondary | ICD-10-CM | POA: Diagnosis not present

## 2015-12-14 LAB — POCT INFLUENZA A: RAPID INFLUENZA A AGN: NEGATIVE

## 2015-12-14 MED ORDER — AMOXICILLIN-POT CLAVULANATE 875-125 MG PO TABS: 1.0000 | ORAL_TABLET | Freq: Two times a day (BID) | ORAL | Status: DC

## 2015-12-14 NOTE — Progress Notes (Signed)
PCP: Webb Silversmith, NP  Subjective:  Debbie Walter is a 35 y.o. year old very pleasant female patient who presents with sinusitis symptoms including nasal congestion, sinus tenderness.   About march 17th, Started with sinus congestion. Her 80 month old had been sick. Took corricidin but didn't help much. Cough drops helped minimally. 35 year old has also been ill with ear infection and cough. Weak, poor sleep. Some anxiety at night- has xanax prn. Minimal cough. Coughing up yellow sputum when does cough and blows out yellow congestion. Has pain in maxillary area. Started with chills last night and feels slightly worse.   -sick contacts/travel/risks: denies flu exposure.   ROS-denies fever, SOB, NVD, tooth pain  Pertinent Past Medical History-  Patient Active Problem List   Diagnosis Date Noted  . Overactive thyroid gland 09/22/2015  . HTN (hypertension) 02/10/2015  . PCOS (polycystic ovarian syndrome) 02/10/2015  . Generalized anxiety disorder 02/10/2015  . GERD (gastroesophageal reflux disease) 02/10/2015  . Elevated urine levels of catecholamines 01/14/2015  . H/O hyperparathyroidism 01/14/2015    Medications- reviewed  Current Outpatient Prescriptions  Medication Sig Dispense Refill  . busPIRone (BUSPAR) 5 MG tablet Take 1 tablet (5 mg total) by mouth daily. 30 tablet 2  . chlorthalidone (HYGROTON) 25 MG tablet TAKE 1 TABLET BY MOUTH EVERY DAY 90 tablet 0  . cholecalciferol (VITAMIN D) 1000 UNITS tablet Take 1,000 Units by mouth every other day.    Marland Kitchen KLOR-CON M20 20 MEQ tablet Take 1 tablet (20 mEq total) by mouth 2 (two) times daily. 180 tablet 1  . metFORMIN (GLUCOPHAGE) 500 MG tablet Take 1 tablet (500 mg total) by mouth 2 (two) times daily with a meal. 180 tablet 3  . metoprolol (LOPRESSOR) 50 MG tablet TAKE 1 TABLET BY MOUTH TWICE A DAY 180 tablet 0  . pantoprazole (PROTONIX) 40 MG tablet TAKE 1 TABLET BY MOUTH EVERY DAY 90 tablet 0  . Prenat w/o A-FeCbGl-DSS-FA-DHA  (CITRANATAL ASSURE) 35-1 & 300 MG tablet Take 1 tablet by mouth daily.  11  . valsartan (DIOVAN) 80 MG tablet TAKE 1 TABLET BY MOUTH EVERY DAY 90 tablet 1  . ALPRAZolam (XANAX) 0.25 MG tablet TAKE 1 TABLET BY MOUTH EVERY DAY AS NEEDED (Patient not taking: Reported on 12/14/2015) 30 tablet 0  . amoxicillin-clavulanate (AUGMENTIN) 875-125 MG tablet Take 1 tablet by mouth 2 (two) times daily. 14 tablet 0   No current facility-administered medications for this visit.    Objective: BP 118/80 mmHg  Pulse 77  Temp(Src) 98.3 F (36.8 C)  Wt 208 lb (94.348 kg) Gen: NAD, resting comfortably HEENT: Turbinates erythematous with yellow drainage, TM normal, pharynx mildly erythematous with no tonsilar exudate or edema, maxillary sinus tenderness CV: RRR no murmurs rubs or gallops Lungs: CTAB no crackles, wheeze, rhonchi Abdomen: soft/nontender/nondistended/normal bowel sounds. No rebound or guarding.  Ext: no edema Skin: warm, dry, no rash Neuro: grossly normal, moves all extremities  Assessment/Plan:  Sinsusitis Bacterial based on: Symptoms >10 days  Treatment: -other symptomatic care with Mucinex reasonable -Antibiotic indicated: yes  Finally, we reviewed reasons to return to care including if symptoms worsen or persist or new concerns arise.  Meds ordered this encounter  Medications  . amoxicillin-clavulanate (AUGMENTIN) 875-125 MG tablet    Sig: Take 1 tablet by mouth 2 (two) times daily.    Dispense:  14 tablet    Refill:  0  patient lower risk at baseline- young and largely healthy except for HTN, anxiety, GERD

## 2015-12-14 NOTE — Patient Instructions (Signed)
I believe you have a sinus infection and there is a high probability it is due to bacteria. We can often clear these infections on our own without the aid of antibiotics. I have given you an antibiotic and want you to take it if your symptoms worsen or if they do not slowly improve or resolve completely within a week

## 2016-01-03 ENCOUNTER — Other Ambulatory Visit: Payer: Self-pay | Admitting: Internal Medicine

## 2016-01-04 NOTE — Telephone Encounter (Signed)
CPE 08/27/15.  Next follow up scheduled 02/25/16, will refill until that tine.

## 2016-01-14 ENCOUNTER — Encounter: Payer: Self-pay | Admitting: Internal Medicine

## 2016-01-14 ENCOUNTER — Ambulatory Visit (INDEPENDENT_AMBULATORY_CARE_PROVIDER_SITE_OTHER): Payer: Federal, State, Local not specified - PPO | Admitting: Internal Medicine

## 2016-01-14 VITALS — BP 118/70 | HR 105 | Temp 98.5°F | Resp 12 | Wt 210.0 lb

## 2016-01-14 DIAGNOSIS — E282 Polycystic ovarian syndrome: Secondary | ICD-10-CM

## 2016-01-14 DIAGNOSIS — R825 Elevated urine levels of drugs, medicaments and biological substances: Secondary | ICD-10-CM | POA: Diagnosis not present

## 2016-01-14 DIAGNOSIS — E042 Nontoxic multinodular goiter: Secondary | ICD-10-CM | POA: Diagnosis not present

## 2016-01-14 DIAGNOSIS — L309 Dermatitis, unspecified: Secondary | ICD-10-CM | POA: Diagnosis not present

## 2016-01-14 DIAGNOSIS — D649 Anemia, unspecified: Secondary | ICD-10-CM

## 2016-01-14 DIAGNOSIS — L918 Other hypertrophic disorders of the skin: Secondary | ICD-10-CM | POA: Diagnosis not present

## 2016-01-14 DIAGNOSIS — L83 Acanthosis nigricans: Secondary | ICD-10-CM | POA: Diagnosis not present

## 2016-01-14 DIAGNOSIS — L300 Nummular dermatitis: Secondary | ICD-10-CM | POA: Diagnosis not present

## 2016-01-14 LAB — BASIC METABOLIC PANEL
BUN: 14 mg/dL (ref 6–23)
CHLORIDE: 102 meq/L (ref 96–112)
CO2: 27 mEq/L (ref 19–32)
Calcium: 10.7 mg/dL — ABNORMAL HIGH (ref 8.4–10.5)
Creatinine, Ser: 0.8 mg/dL (ref 0.40–1.20)
GFR: 103.99 mL/min (ref >60.00)
Glucose, Bld: 113 mg/dL — ABNORMAL HIGH (ref 70–99)
POTASSIUM: 3.2 meq/L — AB (ref 3.5–5.1)
Sodium: 138 mEq/L (ref 135–145)

## 2016-01-14 LAB — CBC
HCT: 38.8 % (ref 36.0–46.0)
Hemoglobin: 12.9 g/dL (ref 12.0–15.0)
MCHC: 33.2 g/dL (ref 30.0–36.0)
MCV: 79 fl (ref 78.0–100.0)
Platelets: 335 10*3/uL (ref 150.0–400.0)
RBC: 4.92 Mil/uL (ref 3.87–5.11)
RDW: 15.4 % (ref 11.5–15.5)
WBC: 9.9 10*3/uL (ref 4.0–10.5)

## 2016-01-14 LAB — VITAMIN D 25 HYDROXY (VIT D DEFICIENCY, FRACTURES): VITD: 37.07 ng/mL (ref 30.00–100.00)

## 2016-01-14 LAB — HEMOGLOBIN A1C: HEMOGLOBIN A1C: 6.3 % (ref 4.6–6.5)

## 2016-01-14 NOTE — Patient Instructions (Signed)
Please stop at the lab.  Please return in 1 year.  

## 2016-01-14 NOTE — Progress Notes (Signed)
Patient ID: Debbie Walter, female   DOB: 18-Sep-1981, 35 y.o.   MRN: [id number]    HPI  Debbie Walter is a 35 y.o.-year-old female, initially referred by her PCP, Dr. Elijio Miles, for management of elevated metanephrines, hypercalcemia, elevated HbA1c, thyroid nodules. Last visit 6 mo ago.  Elevated urinary metanephrines: Reviewed and addended hx:  Pt. has been dx with hypertension dx at 1 (2003), had preeclampsia with her last pregnancy (has a 59 mo old). She also has a 3.5 mo old - was on Alpha methyl-dopa >> BP controlled then. This med did not help for the second pregnancy >> was on Metoprolol and Verapamil. Now off Verapamil, taking: - Metoprolol 50 mg bid - has been on this for years, increased 1 year ago - Valsartan 160 mg daily - Chlorthalidone 25 mg daily She was on Amlodipine 5 mg daily >> dropped BP too low and developed dizziness.  During the investigation for hypertension, she had a 24-hour urine fractionated metanephrine level checked in 07/20/2014 and the metanephrines returned slightly elevated as shown below. Patient mentions that she was still drinking caffeinated drinks and wine cooler during the period of the collection: Total volume 2.6 L Metanephrine 233 g per 24 hour (36-190) Normetanephrine 382 (35-482) Total metanephrines 614 (016-010).  Of note, at that time, she had the PRA 0.44 (0.25-5.8) with a potassium of 4.1. The test was run at [facility].  She had a repeat 24-hour urine for fractionated metanephrines, which again showed a slightly elevated metanephrine, practically unchanged from prior, despite being off caffeine and alcohol (not on Buspar yet):  Total volume 1.9 L Metanephrine 238 g per 24 hour (36-190) Normetanephrine 282 (35-482) Total metanephrines 520 (932-355).   Of note, she had 2 abdominal CT scans: in 2009 and 2014 and there were no adrenal masses.  01/07/2015: normal plasma metanephrines, normal urinary catecholamines, slightly elevated  urinary metanephrines: Component     Latest Ref Rng 01/07/2015  Total Volume - CF 24Hr U      1500  Epinephrine, 24 hr Urine      REPORT  Norepinephrine, 24 hr Ur     15 - 100 mcg/24 h 55  Calculated Total (E+NE)     26 - 121 mcg/24 h 55  Dopamine, 24 hr Urine     52 - 480 mcg/24 h 394  Creatinine, Urine mg/day-CATEUR     0.63 - 2.50 g/24 h 1.70  Metanephrine, Free     <=57 pg/mL 45  Normetanephrine, Free     <=148 pg/mL 47  Total Metanephrines-Plasma     <=205 pg/mL 92  Metanephrines, Ur     36 - 190 mcg/24 h 280 (H)  Normetanephrine, 24H Ur     35 - 482 mcg/24 h 287  Metaneph Total, Ur     115 - 695 mcg/24 h 567  Creatinine, Urine      123.2  Creatinine, 24H Ur     700 - 1800 mg/day 1848 (H)   She was on BuSpar.  Next check:  Component     Latest Ref Rng 07/26/2015  Metanephrines, Ur     36 - 190 mcg/24 h 250 (H)  Normetanephr.,U,24h     35 - 482 mcg/24 h 285  Metanephrine, Ur     115 - 695 mcg/24 h 535  Creatinine, Urine     20 - 320 mg/dL 88  Creatinine, 24H Ur     0.63 - 2.50 g/24 h 2.29   Due to  persistently elevated metanephrines, we checked an MIBG scan (09/10/2015) (she had to pay 1400$ out of pocket!) >> negative for Pheochromocytoma or paraganglioma:  No abnormal accumulation of radiotracer on the whole-body planar imaging. Physiologic uptake is noted in the liver and to lesser degree lungs and GI tract.  Focal activity is noted in the LEFT neck. Dedicated planar and SPECT imaging of the neck demonstrate this activity to localize to the blocked LEFT lobe of thyroid gland. Activity in blocked thyroid glands can be seen in a minority of cases. Presumed RIGHT hemi thyroidectomy in the past.  IMPRESSION: 1. No evidence of pheochromocytoma on whole-body MIBG scan. 2. 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  Due to the increased activity in the left thyroid lobe >> we checked thyroid ultrasound (10/01/2015):  Thyroid  ultrasound (10/01/2015):   Right thyroid lobe: Surgically absent  Left thyroid lobe: 5.9 x 1.6 x 2.5 cm. Homogeneous left thyroid gland echotexture. Small incidental hypoechoic left mid and lower pole nodules (3) all measuring 7 mm or less in size. These remain nonspecific.  Isthmus Thickness: 3 mm. No nodules visualized.  Lymphadenopathy: None visualized.  IMPRESSION: Previous right thyroidectomy. No abnormality demonstrated by ultrasound in the right thyroid bed. Incidental sub cm left mid and lower pole nodules (3), all measuring 7 mm or less in size. No adenopathy  Hyperparathyroidism/hypercalcemia: Patient describes that she has a history of R parathyroidectomy at 35 y/o. She had HTN then. She saw endo then >> Dr Windell Norfolk >> had low calcium then >> anxiety >> started to take calcium. She was on calcium 600-400 mg-U every other day (switched after calcium 10.7 on 07/16/2014). Calcium was 11 on 09/03/2014.   She had a kidney stone and had lithotripsy in 2014.  Reviewed pertinent labs:  Component     Latest Ref Rng 12/02/2014  Calcium     8.4 - 10.5 mg/dL 10.3  GFR     >60.00 mL/min 109.36  PTH     15 - 65 pg/mL 57  VITD     30.00 - 100.00 ng/mL 39.36   12/15/2014:  Calcium 10.9 (8.4-10.5) - took Tums before the test!, magnesium 1.8 (1.5-2.5), BUN/creatinine 10/0.63. Sodium 141, potassium 3.6.  04/27/2015: Calcium 11.2 (8.7-10.2)  06/09/2015:  Ionized calcium: 5.2 (4.5-5.6), calcium 9.8 (8.7-10.2), magnesium 2.1 (1.6-2.3), potassium 3.4 (3.5-5.2). She is on vit D 1000 units daily.   Component     Latest Ref Rng 07/26/2015          Creatinine, Urine     20 - 320 mg/dL 88  Creatinine, 24H Ur     0.63 - 2.50 g/24 h 2.29  Calcium, Ur     Not estab mg/dL 7  Calcium, 24 hour urine     35 - 250 mg/24 h 182   Urine calcium not elevated, but not low either. Calcium can be elevated in the setting of excess catecholamines.   She is not on calcium supplements; she is  taking vitamin D 1000 units daily.  Elevated hemoglobin A1c: She has a diagnosis of PCOS.Not on OCPs. She had tubal ligation in the past.  She is on metformin 500 mg twice a day.  Latest hemoglobin A1c: Lab Results  Component Value Date   HGBA1C 5.7 07/16/2015   HGBA1C 6.1 12/02/2014   Pt describes: - + weight Gain - + fatigue - + Anxiety - no palpitations - no HA  - no CP - no SOB  Pt denies feeling nodules  in neck, hoarseness, dysphagia/odynophagia, SOB with lying down.  No FH of thyroid cancer. FH with HTN: M,F,S. No pituitary tumor FH.  ROS: Constitutional: + weight gain, + fatigue, no subjective hyperthermia/hypothermia Eyes: no blurry vision, no xerophthalmia ENT: no sore throat, no nodules palpated in throat, no dysphagia/odynophagia, no hoarseness Cardiovascular: no CP/SOB/palpitations/leg swelling Respiratory: no cough/SOB Gastrointestinal: no N/V/D/C Musculoskeletal: no muscle/joint aches Skin: no rashes Neurological: no tremors/numbness/tingling/dizziness  I reviewed pt's medications, allergies, PMH, social hx, family hx, and changes were documented in the history of present illness. Otherwise, unchanged from my initial visit note:  Past Medical History  Diagnosis Date  . Panic attacks   . Hypertension   . Fibroids   . PCOS (polycystic ovarian syndrome)     takes metformin for this  . Kidney stones 5/14  . GERD (gastroesophageal reflux disease)     takes protonix   Past Surgical History  Procedure Laterality Date  . Parathyroidectomy    . Cesarean section    . Parathyroidectomy    . Hysteroscopy    . Cesarean section with bilateral tubal ligation Bilateral 05/15/2014    Procedure: CESAREAN SECTION WITH BILATERAL TUBAL LIGATION;  Surgeon: Cyril Mourning, MD;  Location: [facility];  Service: Obstetrics;  Laterality: Bilateral;  repeat  edc 06/10/14   History   Social History  . Marital Status: Married    Spouse Name: N/A    Number of Children: 2  (4 miscarriages)   Occupational History  . Nurse    Social History Main Topics  . Smoking status: Never Smoker   . Smokeless tobacco: Never Used  . Alcohol Use: No  . Drug Use: No  . Sexual Activity: Yes   Current Outpatient Prescriptions on File Prior to Visit  Medication Sig Dispense Refill  . ALPRAZolam (XANAX) 0.25 MG tablet TAKE 1 TABLET BY MOUTH EVERY DAY AS NEEDED (Patient not taking: Reported on 12/14/2015) 30 tablet 0  . amoxicillin-clavulanate (AUGMENTIN) 875-125 MG tablet Take 1 tablet by mouth 2 (two) times daily. 14 tablet 0  . busPIRone (BUSPAR) 5 MG tablet Take 1 tablet (5 mg total) by mouth daily. 30 tablet 2  . chlorthalidone (HYGROTON) 25 MG tablet TAKE 1 TABLET BY MOUTH EVERY DAY 90 tablet 0  . cholecalciferol (VITAMIN D) 1000 UNITS tablet Take 1,000 Units by mouth every other day.    Marland Kitchen KLOR-CON M20 20 MEQ tablet Take 1 tablet (20 mEq total) by mouth 2 (two) times daily. 180 tablet 1  . metFORMIN (GLUCOPHAGE) 500 MG tablet Take 1 tablet (500 mg total) by mouth 2 (two) times daily with a meal. 180 tablet 3  . metoprolol (LOPRESSOR) 50 MG tablet TAKE 1 TABLET BY MOUTH TWICE A DAY 180 tablet 0  . pantoprazole (PROTONIX) 40 MG tablet TAKE 1 TABLET BY MOUTH EVERY DAY 90 tablet 0  . Prenat w/o A-[id number]-[id number]-FA-DHA (CITRANATAL ASSURE) 35-1 & 300 MG tablet Take 1 tablet by mouth daily.  11  . valsartan (DIOVAN) 80 MG tablet TAKE 1 TABLET BY MOUTH EVERY DAY 90 tablet 1   No current facility-administered medications on file prior to visit.   Allergies  Allergen Reactions  . Procardia [Nifedipine] Other (See Comments)    headache  . Wellbutrin [Bupropion] Other (See Comments)    "Makes my blood pressure go up - I don't ever want to take it again"  . Labetalol Other (See Comments)    "makes me feel like things are crawling through my head"  . Zithromax [Azithromycin]  Other (See Comments)    Gi upset.  Diarrhea   Family History  Problem Relation Age of Onset  . Anesthesia  problems Neg Hx   . Hypotension Neg Hx   . Malignant hyperthermia Neg Hx   . Pseudochol deficiency Neg Hx   . Hyperlipidemia Mother   . Heart disease Mother   . Diabetes Mother   . Hypertension Mother   . Hyperlipidemia Father   . Hypertension Father   . Cancer Father     rectal  . Hypertension Sister     diabetes in mother  Hypertension in mother and father  Acute MI in mother  Rectal cancer in father  PE: BP 118/70 mmHg  Pulse 105  Temp(Src) 98.5 F (36.9 C) (Oral)  Resp 12  Wt 210 lb (95.255 kg)  SpO2 96% Body mass index is 39.04 kg/(m^2). Wt Readings from Last 3 Encounters:  12/14/15 208 lb (94.348 kg)  10/18/15 204 lb 1.9 oz (92.588 kg)  08/27/15 198 lb 8 oz (90.039 kg)   Constitutional: obese,  fat pads not enlarged  in NAD Eyes: PERRLA, EOMI, no exophthalmos ENT: moist mucous membranes, no thyromegaly, no cervical lymphadenopathy Cardiovascular: RRR, No MRG Respiratory: CTA B Gastrointestinal: abdomen soft, NT, ND, BS+ Musculoskeletal: no deformities, strength intact in all 4 Skin: moist, warm, no rashes Neurological: No mild tremor with outstretched hands, DTR normal in all 4  ASSESSMENT: 1. Elevated urinary metanephrines  - Mild - previous abdominal CT scan reports (without contrast): 04/02/2008 and 02/10/2013. Both report normal adrenals and no suspicious abdominal masses.  - MIBG scan from 07/2015: Negative   - The one thing that it's concerning is that she has a history of parathyroidectomy for primary hyperparathyroidism while very young, 35 y/o. She does not have family history of hypercalcemia, kidney stones, thyroid cancer, renal cell cancer, pheochromocytoma, to point towards an MEN2A syndrome, but this can happen spontaneously.   2. Hypercalcemia - History of right parathyroidectomy for primary hyperparathyroidism   PLAN:  1. Mildly elevated urinary metanephrines  - Patient with history of hypertension since she was 35 years old, and  preeclampsia with her last pregnancy.  Urine metanephrines checked after birth returned slightly elevated and they remained mildly elevated initially on BuSpar, and then off this medicine. After last visit, due to persistently elevated metanephrines, we checked an MIBG scan which returned negative for pheochromocytoma or paraganglioma. This is very reassuring, and we decided to skip a urine metanephrine check at today's visit, but repeated when she comes back, in a year.   2. Hypercalcemia - Please see above - Latest calcium levels have been normal - She has a history of primary hyperparathyroidism at 15 and she is now status post right parathyroidectomy and right thyroidectomy  - In 06/2015, her vitamin D, ionized calcium and magnesium were normal. - will check a Ca and  vit D  today  3. PCOS - Patient continues on metformin. She is not on OCPs. - gaining weight >> she is considering gastric bypass - Recommended the [facility] weight loss program here in [place] - Will repeat a hemoglobin A1c today along with the testosterone level   4. Small nodules in left thyroid lobe - Increase uptake on MIBG scan - Subsequent thyroid ultrasound showing small, subcentimeter nodules, and no heterogeneity - No further investigation needed for now but I plan to repeat her thyroid ultrasound in ~2 years.  5. History of anemia - Patient would want me to check her hemoglobin today  Orders Placed This Encounter  Procedures  . CBC  . Basic metabolic panel  . Hemoglobin A1c  . Testosterone, Free, Total, SHBG  . VITAMIN D 25 Hydroxy (Vit-D Deficiency, Fractures)  . Testosterone, Free, Total, SHBG   - time spent with the patient: 40 min, of which >50% was spent in obtaining information about her symptoms, reviewing together her previous labs, evaluations, counseling her about her condition (please see the discussed topics above), and developing a plan to further investigate it. She had a number of  questions which I addressed.  Component     Latest Ref Rng 01/14/2016  Sodium     135 - 145 mEq/L 138  Potassium     3.5 - 5.1 mEq/L 3.2 (L)  Chloride     96 - 112 mEq/L 102  CO2     19 - 32 mEq/L 27  Glucose     70 - 99 mg/dL 113 (H)  BUN     6 - 23 mg/dL 14  Creatinine     0.40 - 1.20 mg/dL 0.80  Calcium     8.4 - 10.5 mg/dL 10.7 (H)  GFR     >60.00 mL/min 103.99  WBC     4.0 - 10.5 K/uL 9.9  RBC     3.87 - 5.11 Mil/uL 4.92  Platelets     150.0 - 400.0 K/uL 335.0  Hemoglobin     12.0 - 15.0 g/dL 12.9  HCT     36.0 - 46.0 % 38.8  MCV     78.0 - 100.0 fl 79.0  MCHC     30.0 - 36.0 g/dL 33.2  RDW     11.5 - 15.5 % 15.4  Testosterone     8 - 48 ng/dL 85 (H)  Testosterone Free     0.0 - 4.2 pg/mL 4.9 (H)  Sex Horm Binding Glob, Serum     24.6 - 122.0 nmol/L 24.7  Hemoglobin A1C     4.6 - 6.5 % 6.3  VITD     30.00 - 100.00 ng/mL 37.07   All the results are not back and they show: - Slightly low potassium: Advised her to look up high potassium foods on Web M.D.  - Her glucose is slightly higher, as is her hemoglobin A1c. Advised her to watch her diet and limit the intake of carbs and saturated fats. - Calcium is only slightly higher, will continue to keep an eye on it.  - Vitamin D is normal - She is not anemic   -  testosterone is only slightly high, consistent with her diagnosis of PCOS

## 2016-01-16 LAB — TESTOSTERONE, FREE, TOTAL, SHBG
Sex Hormone Binding: 24.7 nmol/L (ref 24.6–122.0)
Testosterone, Free: 4.9 pg/mL — ABNORMAL HIGH (ref 0.0–4.2)
Testosterone: 85 ng/dL — ABNORMAL HIGH (ref 8–48)

## 2016-01-17 ENCOUNTER — Telehealth: Payer: Self-pay | Admitting: Internal Medicine

## 2016-01-17 DIAGNOSIS — E042 Nontoxic multinodular goiter: Secondary | ICD-10-CM | POA: Insufficient documentation

## 2016-01-17 NOTE — Telephone Encounter (Signed)
Please advise her from now on to give [place] at least 48h to get the results to her. All the results are not back and they show: - Slightly low potassium: Please look up high potassium foods on Web M.D.  - Her glucose is slightly higher, as is her hemoglobin A1c . Please advised her to watch her diet and limit the intake of carbs and saturated fats  - Calcium is only slightly higher, will continue to keep an eye on it.  - Vitamin D is normal - She is not anemic   -  testosterone is only slightly high, consistent with her diagnosis of PCOS   Component     Latest Ref Rng 01/14/2016  Sodium     135 - 145 mEq/L 138  Potassium     3.5 - 5.1 mEq/L 3.2 (L)  Chloride     96 - 112 mEq/L 102  CO2     19 - 32 mEq/L 27  Glucose     70 - 99 mg/dL 113 (H)  BUN     6 - 23 mg/dL 14  Creatinine     0.40 - 1.20 mg/dL 0.80  Calcium     8.4 - 10.5 mg/dL 10.7 (H)  GFR     >60.00 mL/min 103.99  WBC     4.0 - 10.5 K/uL 9.9  RBC     3.87 - 5.11 Mil/uL 4.92  Platelets     150.0 - 400.0 K/uL 335.0  Hemoglobin     12.0 - 15.0 g/dL 12.9  HCT     36.0 - 46.0 % 38.8  MCV     78.0 - 100.0 fl 79.0  MCHC     30.0 - 36.0 g/dL 33.2  RDW     11.5 - 15.5 % 15.4  Testosterone     8 - 48 ng/dL 85 (H)  Testosterone Free     0.0 - 4.2 pg/mL 4.9 (H)  Sex Horm Binding Glob, Serum     24.6 - 122.0 nmol/L 24.7  Hemoglobin A1C     4.6 - 6.5 % 6.3  VITD     30.00 - 100.00 ng/mL 37.07

## 2016-01-17 NOTE — Telephone Encounter (Signed)
[facility]  Patient Name: Debbie Walter  DOB: 04-03-1981    Initial Comment Caller states since last Thurs feeling a little weak in her legs, chin and neck area feels as if something is moving, more constantly, tingling and heart palpitations.    Nurse Assessment  Nurse: Harlow Mares, RN, Suanne Marker Date/Time (Eastern Time): 01/17/2016 3:37:47 PM  Confirm and document reason for call. If symptomatic, describe symptoms. You must click the next button to save text entered. ---Caller states since last Thurs feeling a little weak in her legs, chin and neck area feels as if something is moving, more constantly, tingling (lower chin and neck) and heart palpitations (a flutter) Denies SOB. Does report anxiety issues. Had a test last week for her job that has her upset. She reports that she has carpel tunnel and she reports her hands are tingling up to her elbow (both arms) and she usually only has this at night when lie down. Saw [facility]inilogy last week and had a work-up. She had a partial thyroidectomy when she was younger and not sure if this is related. Admitted to a panic attack yesterday but was able to talk herself out of it. Palpitations are not worse today than normal. Reports occasional "indigestion". Reports that she has taken a xanax today and it doesn't seem to be helping her.  Has the patient traveled out of the country within the last 30 days? ---Not Applicable  Does the patient have any new or worsening symptoms? ---Yes  Will a triage be completed? ---Yes  Related visit to physician within the last 2 weeks? ---No  Does the PT have any chronic conditions? (i.e. diabetes, asthma, etc.) ---Yes  List chronic conditions. ---anxiety; parathyroidectomy, indigestion, HTN  Is the patient pregnant or possibly pregnant? (Ask all females between the ages of 44-55) ---No  Is this a behavioral health or substance abuse call? ---No     Guidelines    Guideline Title  Affirmed Question Affirmed Notes  Heart Rate and Heartbeat Questions Problems with anxiety or stress   Neurologic Deficit [1] Numbness or tingling in one or both feet AND [2] is a chronic symptom (recurrent or ongoing AND present > 4 weeks)    Final Disposition User   See PCP When Office is Open (within 3 days) Harlow Mares, RN, Suanne Marker    Comments  Scheduled caller for a MD visit with Loletta Specter, NP for tomorrow 01/18/16 @ 10:15am, caller voiced understanding. This was scheduled at the [facility] location.   Referrals  REFERRED TO PCP OFFICE  REFERRED TO PCP OFFICE   Disagree/Comply: Comply    Disagree/Comply: Comply

## 2016-01-17 NOTE — Telephone Encounter (Signed)
Please read message below and advise.  

## 2016-01-17 NOTE — Telephone Encounter (Signed)
PT said she has been anxiously waiting her lab results and that she requests a call back as soon as possible, you may leave a VM on her phone

## 2016-01-17 NOTE — Telephone Encounter (Signed)
Returned pt's call. Pt voiced understanding. Pt to see PCP this week. Be advised.

## 2016-01-17 NOTE — Telephone Encounter (Signed)
Will discuss at appt

## 2016-01-17 NOTE — Telephone Encounter (Signed)
Pt has appt scheduled 01/18/16 at 10:30 with Avie Echevaria NP.

## 2016-01-18 ENCOUNTER — Ambulatory Visit (INDEPENDENT_AMBULATORY_CARE_PROVIDER_SITE_OTHER): Payer: Federal, State, Local not specified - PPO | Admitting: Family Medicine

## 2016-01-18 ENCOUNTER — Encounter: Payer: Self-pay | Admitting: Family Medicine

## 2016-01-18 ENCOUNTER — Ambulatory Visit: Payer: Self-pay | Admitting: Internal Medicine

## 2016-01-18 VITALS — BP 126/78 | HR 85 | Temp 98.4°F | Wt 210.5 lb

## 2016-01-18 DIAGNOSIS — R202 Paresthesia of skin: Secondary | ICD-10-CM | POA: Diagnosis not present

## 2016-01-18 DIAGNOSIS — R002 Palpitations: Secondary | ICD-10-CM | POA: Diagnosis not present

## 2016-01-18 LAB — BASIC METABOLIC PANEL
BUN: 11 mg/dL (ref 6–23)
CALCIUM: 10.3 mg/dL (ref 8.4–10.5)
CO2: 29 meq/L (ref 19–32)
CREATININE: 0.84 mg/dL (ref 0.40–1.20)
Chloride: 103 mEq/L (ref 96–112)
GFR: 98.29 mL/min (ref >60.00)
GLUCOSE: 82 mg/dL (ref 70–99)
Potassium: 3.6 mEq/L (ref 3.5–5.1)
Sodium: 141 mEq/L (ref 135–145)

## 2016-01-18 LAB — VITAMIN B12: Vitamin B-12: 182 pg/mL — ABNORMAL LOW (ref 211–911)

## 2016-01-18 NOTE — Progress Notes (Signed)
Pre visit review using our clinic review tool, if applicable. No additional management support is needed unless otherwise documented below in the visit note. 

## 2016-01-18 NOTE — Progress Notes (Signed)
Subjective:   Patient ID: Debbie Walter, female    DOB: [birth year], 35 y.o.   MRN: [id number]  LENISHA KOBES is a pleasant 35 y.o. year old female pt of [facility] Silversmith, new to me, who presents to clinic today with Tingling and Palpitations  on 01/18/2016  HPI:  Palpitations and paresthesias- Almost 1 week of weakness in her legs and feels like they are tingling along with tingling in her lower chin and neck. Also feels like her heart is racing at times. Denies SOB or CP. Has had more work related anxiety.  She also has a h/o carpal tunnel syndrome (bilateral). Does not drink much caffeine.  Xanax does not seem to alleviate symptoms.  Followed by Dr. Cruzita Lederer for PCOS, thyroid nodules and elevated urine catecholamines. Was last seen by her on 01/14/16. Note and labs reviewed.  Lab Results  Component Value Date   HGBA1C 6.3 01/14/2016   Lab Results  Component Value Date   TSH 1.79 08/20/2015   Lab Results  Component Value Date   WBC 9.9 01/14/2016   HGB 12.9 01/14/2016   HCT 38.8 01/14/2016   MCV 79.0 01/14/2016   PLT 335.0 01/14/2016   Lab Results  Component Value Date   NA 138 01/14/2016   K 3.2* 01/14/2016   CL 102 01/14/2016   CO2 27 01/14/2016    Current Outpatient Prescriptions on File Prior to Visit  Medication Sig Dispense Refill  . ALPRAZolam (XANAX) 0.25 MG tablet TAKE 1 TABLET BY MOUTH EVERY DAY AS NEEDED 30 tablet 0  . busPIRone (BUSPAR) 5 MG tablet Take 1 tablet (5 mg total) by mouth daily. 30 tablet 2  . chlorthalidone (HYGROTON) 25 MG tablet TAKE 1 TABLET BY MOUTH EVERY DAY 90 tablet 0  . cholecalciferol (VITAMIN D) 1000 UNITS tablet Take 1,000 Units by mouth every other day.    Marland Kitchen KLOR-CON M20 20 MEQ tablet Take 1 tablet (20 mEq total) by mouth 2 (two) times daily. 180 tablet 1  . metFORMIN (GLUCOPHAGE) 500 MG tablet Take 1 tablet (500 mg total) by mouth 2 (two) times daily with a meal. 180 tablet 3  . metoprolol (LOPRESSOR) 50 MG tablet TAKE 1 TABLET  BY MOUTH TWICE A DAY 180 tablet 0  . pantoprazole (PROTONIX) 40 MG tablet TAKE 1 TABLET BY MOUTH EVERY DAY 90 tablet 0  . Prenat w/o A-FeCbGl-DSS-FA-DHA (CITRANATAL ASSURE) 35-1 & 300 MG tablet Take 1 tablet by mouth daily.  11  . valsartan (DIOVAN) 80 MG tablet TAKE 1 TABLET BY MOUTH EVERY DAY 90 tablet 1   No current facility-administered medications on file prior to visit.    Allergies  Allergen Reactions  . Procardia [Nifedipine] Other (See Comments)    headache  . Wellbutrin [Bupropion] Other (See Comments)    "Makes my blood pressure go up - I don't ever want to take it again"  . Labetalol Other (See Comments)    "makes me feel like things are crawling through my head"  . Zithromax [Azithromycin] Other (See Comments)    Gi upset.  Diarrhea    Past Medical History  Diagnosis Date  . Panic attacks   . Hypertension   . Fibroids   . PCOS (polycystic ovarian syndrome)     takes metformin for this  . Kidney stones 5/14  . GERD (gastroesophageal reflux disease)     takes protonix    Past Surgical History  Procedure Laterality Date  . Parathyroidectomy    . Cesarean  section    . Parathyroidectomy    . Hysteroscopy    . Cesarean section with bilateral tubal ligation Bilateral 05/15/2014    Procedure: CESAREAN SECTION WITH BILATERAL TUBAL LIGATION;  Surgeon: Cyril Mourning, MD;  Location: [facility];  Service: Obstetrics;  Laterality: Bilateral;  repeat  edc 06/10/14    Family History  Problem Relation Age of Onset  . Anesthesia problems Neg Hx   . Hypotension Neg Hx   . Malignant hyperthermia Neg Hx   . Pseudochol deficiency Neg Hx   . Hyperlipidemia Mother   . Heart disease Mother   . Diabetes Mother   . Hypertension Mother   . Hyperlipidemia Father   . Hypertension Father   . Cancer Father     rectal  . Hypertension Sister     Social History   Social History  . Marital Status: Married    Spouse Name: N/A  . Number of Children: N/A  . Years of Education:  N/A   Occupational History  . Not on file.   Social History Main Topics  . Smoking status: Never Smoker   . Smokeless tobacco: Never Used  . Alcohol Use: No  . Drug Use: No  . Sexual Activity: Yes    Birth Control/ Protection: None   Other Topics Concern  . Not on file   Social History Narrative   The PMH, PSH, Social History, Family History, Medications, and allergies have been reviewed in [facility], and have been updated if relevant.    Review of Systems  Eyes: Negative.   Respiratory: Negative for apnea.   Cardiovascular: Positive for palpitations. Negative for chest pain and leg swelling.  Endocrine: Negative.   Genitourinary: Negative.   Musculoskeletal: Negative.   Skin: Negative.   Allergic/Immunologic: Negative.   Neurological: Positive for weakness and numbness. Negative for dizziness, syncope, facial asymmetry, speech difficulty, light-headedness and headaches.  Psychiatric/Behavioral: The patient is nervous/anxious.   All other systems reviewed and are negative.      Objective:    BP 126/78 mmHg  Pulse 85  Temp(Src) 98.4 F (36.9 C) (Oral)  Wt 210 lb 8 oz (95.482 kg)  SpO2 97%  LMP  (LMP Unknown)  Breastfeeding? No   Physical Exam  Constitutional: She is oriented to person, place, and time. She appears well-developed and well-nourished. No distress.  HENT:  Head: Normocephalic and atraumatic.  Eyes: Conjunctivae are normal.  Neck: Normal range of motion.  Cardiovascular: Normal rate, regular rhythm and normal heart sounds.   Pulmonary/Chest: Effort normal and breath sounds normal.  Musculoskeletal: Normal range of motion.  Neurological: She is alert and oriented to person, place, and time. No cranial nerve deficit. Coordination normal.  Skin: Skin is warm and dry. She is not diaphoretic.  Psychiatric: She has a normal mood and affect. Her behavior is normal. Judgment and thought content normal.  Nursing note and vitals reviewed.           Assessment & Plan:   Palpitations  Paresthesia of both hands No Follow-up on file.

## 2016-01-18 NOTE — Assessment & Plan Note (Addendum)
Likely multifactorial but does seem most consistent with CTS. Advised wearing wrist splints more regularly, follow up with sports med or ortho. The patient indicates understanding of these issues and agrees with the plan. Check Vit b12 today as well.

## 2016-01-18 NOTE — Patient Instructions (Signed)
Great to see you. We will call you with a cardiology referral.  I will call you with your lab results from today.

## 2016-01-18 NOTE — Assessment & Plan Note (Signed)
New- intermittent. EKG reassuring today- NSR. Pheochromocytoma ruled out by endocrinology- she follows up with Dr. Cruzita Lederer quite regularly.  Just saw her last week. Recent labs done by endo also reassuring. ? Anxiety related although cannot rule out arrhythmia (intermittent). Refer to cardiology for work up/holter monitor.

## 2016-01-20 ENCOUNTER — Telehealth: Payer: Self-pay | Admitting: Internal Medicine

## 2016-01-20 ENCOUNTER — Telehealth: Payer: Self-pay | Admitting: Family Medicine

## 2016-01-20 NOTE — Telephone Encounter (Signed)
PLEASE NOTE: All timestamps contained within this report are represented as [place] Standard Time. CONFIDENTIALTY NOTICE: This fax transmission is intended only for the addressee. It contains information that is legally privileged, confidential or otherwise protected from use or disclosure. If you are not the intended recipient, you are strictly prohibited from reviewing, disclosing, copying using or disseminating any of this information or taking any action in reliance on or regarding this information. If you have received this fax in error, please notify us immediately by telephone so that we can arrange for its return to [place]. Phone: [phone], Toll-Free: [phone], Fax: [phone] Page: 1 of 1 Call Id: [id number] [place] Night - Client Nonclinical Telephone Record [place] Night - Client Client Site Regino Ramirez Physician Arnette Norris - MD Contact Type Call Who Is Calling Patient / Member / Family / Caregiver Caller Name [place] Phone Number [phone] Call Type Message Only Information Provided Reason for Call Returning a Call from the Office Initial Comment Caller States calling office back Additional Comment Call Closed By: Almon Register Transaction Date/Time: 01/19/2016 6:13:11 PM (ET)

## 2016-01-20 NOTE — Telephone Encounter (Signed)
Opened in error

## 2016-01-20 NOTE — Telephone Encounter (Signed)
Patient called to get her lab results.  I let her know the results and she said that she'll try the 1000 mcg OTC. Patient said if Waynetta needed to discuss anything else with her to call her back.  Patient scheduled a follow up appointment with Webb Silversmith on 02/11/16 to check her potassium and B-12.

## 2016-01-20 NOTE — Telephone Encounter (Signed)
This info was transferred to result note and sent to [place].

## 2016-01-20 NOTE — Telephone Encounter (Signed)
Pt returned your call about labs - please call [phone] You can leave message on voicemail talk to husband.  Thanks

## 2016-01-20 NOTE — Telephone Encounter (Signed)
See results note. 

## 2016-01-27 ENCOUNTER — Encounter (HOSPITAL_COMMUNITY): Payer: Self-pay | Admitting: *Deleted

## 2016-01-27 ENCOUNTER — Ambulatory Visit (HOSPITAL_COMMUNITY)
Admission: EM | Admit: 2016-01-27 | Discharge: 2016-01-27 | Disposition: A | Discharge status: Home / Self Care | Payer: Federal, State, Local not specified - PPO

## 2016-01-27 DIAGNOSIS — L83 Acanthosis nigricans: Secondary | ICD-10-CM | POA: Diagnosis not present

## 2016-01-27 DIAGNOSIS — S0003XA Contusion of scalp, initial encounter: Secondary | ICD-10-CM | POA: Diagnosis not present

## 2016-01-27 NOTE — Discharge Instructions (Signed)
advil and ice for head soreness. See your doctor about neck rash.

## 2016-01-27 NOTE — ED Provider Notes (Signed)
CSN: [id number]     Arrival date & time 01/27/16  1939 History   None    Chief Complaint  Patient presents with  . Head Injury   (Consider location/radiation/quality/duration/timing/severity/associated sxs/prior Treatment) Patient is a 35 y.o. female presenting with head injury. The history is provided by the patient.  Head Injury Location:  R parietal Time since incident:  4 hours Mechanism of injury: direct blow   Mechanism of injury comment:  Stood up in garage and hit head on shock absorber sustaining sm hematoma, no loc, no neuro sx, currently resolving Pain details:    Quality:  Dull   Severity:  Mild   Progression:  Improving Chronicity:  New Relieved by:  None tried Worsened by:  Nothing tried Ineffective treatments:  None tried Associated symptoms: headache   Associated symptoms: no blurred vision, no double vision, no loss of consciousness, no memory loss, no nausea, no numbness and no vomiting     Past Medical History  Diagnosis Date  . Panic attacks   . Hypertension   . Fibroids   . PCOS (polycystic ovarian syndrome)     takes metformin for this  . Kidney stones 5/14  . GERD (gastroesophageal reflux disease)     takes protonix   Past Surgical History  Procedure Laterality Date  . Parathyroidectomy    . Cesarean section    . Parathyroidectomy    . Hysteroscopy    . Cesarean section with bilateral tubal ligation Bilateral 05/15/2014    Procedure: CESAREAN SECTION WITH BILATERAL TUBAL LIGATION;  Surgeon: Cyril Mourning, MD;  Location: [facility];  Service: Obstetrics;  Laterality: Bilateral;  repeat  edc 06/10/14   Family History  Problem Relation Age of Onset  . Anesthesia problems Neg Hx   . Hypotension Neg Hx   . Malignant hyperthermia Neg Hx   . Pseudochol deficiency Neg Hx   . Hyperlipidemia Mother   . Heart disease Mother   . Diabetes Mother   . Hypertension Mother   . Hyperlipidemia Father   . Hypertension Father   . Cancer Father     rectal  .  Hypertension Sister    Social History  Substance Use Topics  . Smoking status: Never Smoker   . Smokeless tobacco: Never Used  . Alcohol Use: No   OB History    Gravida Para Term Preterm AB TAB SAB Ectopic Multiple Living   6 2 1 1 4  3 1  2      Review of Systems  Constitutional: Negative.   Eyes: Negative for blurred vision and double vision.  Gastrointestinal: Negative for nausea and vomiting.  Skin: Positive for rash.  Neurological: Positive for headaches. Negative for dizziness, loss of consciousness, speech difficulty, weakness, light-headedness and numbness.  Psychiatric/Behavioral: Negative for memory loss.    Allergies  Procardia; Wellbutrin; Labetalol; and Zithromax  Home Medications   Prior to Admission medications   Medication Sig Start Date End Date Taking? Authorizing Provider  ALPRAZolam Duanne Moron) 0.25 MG tablet TAKE 1 TABLET BY MOUTH EVERY DAY AS NEEDED 12/02/15   [name], NP  busPIRone (BUSPAR) 5 MG tablet Take 1 tablet (5 mg total) by mouth daily. 08/03/15   [name], NP  chlorthalidone (HYGROTON) 25 MG tablet TAKE 1 TABLET BY MOUTH EVERY DAY 01/04/16   [name], NP  cholecalciferol (VITAMIN D) 1000 UNITS tablet Take 1,000 Units by mouth every other day.    Historical Provider, MD  KLOR-CON M20 20 MEQ tablet  Take 1 tablet (20 mEq total) by mouth 2 (two) times daily. 08/27/15   [name], NP  metFORMIN (GLUCOPHAGE) 500 MG tablet Take 1 tablet (500 mg total) by mouth 2 (two) times daily with a meal. 01/14/15   Philemon Kingdom, MD  metoprolol (LOPRESSOR) 50 MG tablet TAKE 1 TABLET BY MOUTH TWICE A DAY 01/04/16   [name], NP  pantoprazole (PROTONIX) 40 MG tablet TAKE 1 TABLET BY MOUTH EVERY DAY 01/04/16   [name], NP  Prenat w/o A-FeCbGl-DSS-FA-DHA (CITRANATAL ASSURE) 35-1 & 300 MG tablet Take 1 tablet by mouth daily. 01/28/15   Historical Provider, MD  valsartan (DIOVAN) 80 MG tablet TAKE 1 TABLET BY MOUTH EVERY DAY 10/02/15   [name], NP   Meds Ordered and Administered this Visit  Medications - No data to display  BP 152/84 mmHg  Pulse 78  Temp(Src) 97.9 F (36.6 C) (Oral)  Resp 16  SpO2 100%  LMP 11/30/2015 No data found.   Physical Exam  Constitutional: She is oriented to person, place, and time. She appears well-developed and well-nourished.  HENT:  Head: Normocephalic. Head is with contusion. Head is without Battle's sign, without abrasion and without laceration. Hair is normal.    Right Ear: External ear normal.  Left Ear: External ear normal.  Mouth/Throat: Oropharynx is clear and moist.  Eyes: Conjunctivae are normal. Pupils are equal, round, and reactive to light.  Neck: Normal range of motion. Neck supple.  Musculoskeletal: Normal range of motion.  Lymphadenopathy:    She has no cervical adenopathy.  Neurological: She is alert and oriented to person, place, and time. She displays normal reflexes. No cranial nerve deficit. Coordination normal.  Skin: Skin is warm and dry. Rash noted.  Hyperpigmentation rash around neck.  Nursing note and vitals reviewed.   ED Course  Procedures (including critical care time)  Labs Review Labs Reviewed - No data to display  Imaging Review No results found.   Visual Acuity Review  Right Eye Distance:   Left Eye Distance:   Bilateral Distance:    Right Eye Near:   Left Eye Near:    Bilateral Near:         MDM   1. Scalp contusion, initial encounter   2. Acquired acanthosis nigricans        Billy Fischer, MD 01/28/16 [id number]

## 2016-01-27 NOTE — ED Notes (Signed)
Pt   Raised  Her  Head  Up    And    Bumped  It on  Metal  Shock absorbers      She has  A  Knot     She  Did  Not black ot  Her  Pupils  Are  Equal  And  Reactive   No  Vomiting     pearla

## 2016-02-04 DIAGNOSIS — L905 Scar conditions and fibrosis of skin: Secondary | ICD-10-CM | POA: Diagnosis not present

## 2016-02-04 DIAGNOSIS — L300 Nummular dermatitis: Secondary | ICD-10-CM | POA: Diagnosis not present

## 2016-02-10 ENCOUNTER — Encounter: Payer: Self-pay | Admitting: Internal Medicine

## 2016-02-10 ENCOUNTER — Other Ambulatory Visit: Payer: Self-pay

## 2016-02-10 ENCOUNTER — Ambulatory Visit (INDEPENDENT_AMBULATORY_CARE_PROVIDER_SITE_OTHER): Payer: Federal, State, Local not specified - PPO | Admitting: Internal Medicine

## 2016-02-10 ENCOUNTER — Ambulatory Visit: Payer: Federal, State, Local not specified - PPO | Admitting: Internal Medicine

## 2016-02-10 VITALS — BP 120/76 | HR 82 | Temp 99.0°F | Wt 210.5 lb

## 2016-02-10 DIAGNOSIS — E538 Deficiency of other specified B group vitamins: Secondary | ICD-10-CM | POA: Diagnosis not present

## 2016-02-10 DIAGNOSIS — R002 Palpitations: Secondary | ICD-10-CM

## 2016-02-10 DIAGNOSIS — R202 Paresthesia of skin: Secondary | ICD-10-CM

## 2016-02-10 MED ORDER — ALPRAZOLAM 0.25 MG PO TABS: 0.2500 mg | ORAL_TABLET | Freq: Every day | ORAL | Status: DC | PRN

## 2016-02-10 NOTE — Telephone Encounter (Signed)
Rx called into pharmacy Per verbal order from [facility] had OV today

## 2016-02-10 NOTE — Patient Instructions (Signed)

## 2016-02-10 NOTE — Progress Notes (Signed)
Pre visit review using our clinic review tool, if applicable. No additional management support is needed unless otherwise documented below in the visit note. 

## 2016-02-10 NOTE — Progress Notes (Signed)
Subjective:    Patient ID: Debbie Walter, female    DOB: [birth year], 35 y.o.   MRN: [id number]  HPI  Pt presents to the clinic today to discuss Vit B12 deficiency. She reports she saw Dr. Deborra Medina 5/2, c/o intermittent palpations and numbness to her face. ECG at that time was normal. Her B12 was checked and was low. She decided to start taking B12 orally and reports the palpitations and numbness in her face have almost completely resolved. Dr. Deborra Medina did refer her to cardiology for further evaluation of paroxysmal arrhyhtmia, and she has an appt with Dr. Einar Gip tomorrow.  She also wants to let me know that she is about to start a medically supervised weight loss program through [facility].  Review of Systems      Past Medical History  Diagnosis Date  . Panic attacks   . Hypertension   . Fibroids   . PCOS (polycystic ovarian syndrome)     takes metformin for this  . Kidney stones 5/14  . GERD (gastroesophageal reflux disease)     takes protonix    Current Outpatient Prescriptions  Medication Sig Dispense Refill  . ALPRAZolam (XANAX) 0.25 MG tablet TAKE 1 TABLET BY MOUTH EVERY DAY AS NEEDED 30 tablet 0  . busPIRone (BUSPAR) 5 MG tablet Take 1 tablet (5 mg total) by mouth daily. 30 tablet 2  . chlorthalidone (HYGROTON) 25 MG tablet TAKE 1 TABLET BY MOUTH EVERY DAY 90 tablet 0  . cholecalciferol (VITAMIN D) 1000 UNITS tablet Take 1,000 Units by mouth every other day.    Marland Kitchen KLOR-CON M20 20 MEQ tablet Take 1 tablet (20 mEq total) by mouth 2 (two) times daily. 180 tablet 1  . metFORMIN (GLUCOPHAGE) 500 MG tablet Take 1 tablet (500 mg total) by mouth 2 (two) times daily with a meal. 180 tablet 3  . metoprolol (LOPRESSOR) 50 MG tablet TAKE 1 TABLET BY MOUTH TWICE A DAY 180 tablet 0  . pantoprazole (PROTONIX) 40 MG tablet TAKE 1 TABLET BY MOUTH EVERY DAY 90 tablet 0  . Prenat w/o A-FeCbGl-DSS-FA-DHA (CITRANATAL ASSURE) 35-1 & 300 MG tablet Take 1 tablet by mouth daily.  11  . valsartan  (DIOVAN) 80 MG tablet TAKE 1 TABLET BY MOUTH EVERY DAY 90 tablet 1  . vitamin B-12 (CYANOCOBALAMIN) 1000 MCG tablet Take 1,000 mcg by mouth daily.     No current facility-administered medications for this visit.    Allergies  Allergen Reactions  . Procardia [Nifedipine] Other (See Comments)    headache  . Wellbutrin [Bupropion] Other (See Comments)    "Makes my blood pressure go up - I don't ever want to take it again"  . Labetalol Other (See Comments)    "makes me feel like things are crawling through my head"  . Zithromax [Azithromycin] Other (See Comments)    Gi upset.  Diarrhea    Family History  Problem Relation Age of Onset  . Anesthesia problems Neg Hx   . Hypotension Neg Hx   . Malignant hyperthermia Neg Hx   . Pseudochol deficiency Neg Hx   . Hyperlipidemia Mother   . Heart disease Mother   . Diabetes Mother   . Hypertension Mother   . Hyperlipidemia Father   . Hypertension Father   . Cancer Father     rectal  . Hypertension Sister     Social History   Social History  . Marital Status: Married    Spouse Name: N/A  . Number  of Children: N/A  . Years of Education: N/A   Occupational History  . Not on file.   Social History Main Topics  . Smoking status: Never Smoker   . Smokeless tobacco: Never Used  . Alcohol Use: No  . Drug Use: No  . Sexual Activity: Yes    Birth Control/ Protection: None   Other Topics Concern  . Not on file   Social History Narrative     Constitutional: Denies fever, malaise, fatigue, headache or abrupt weight changes.  Respiratory: Denies difficulty breathing, shortness of breath, cough or sputum production.   Cardiovascular: Denies chest pain, chest tightness, palpitations or swelling in the hands or feet.  Neurological: Denies dizziness, difficulty with memory, difficulty with speech or problems with balance and coordination.  Psych: Pt has history of anxiety. Denies depression, SI/HI.  No other specific complaints in  a complete review of systems (except as listed in HPI above).  Objective:   Physical Exam  BP 120/76 mmHg  Pulse 82  Temp(Src) 99 F (37.2 C) (Oral)  Wt 210 lb 8 oz (95.482 kg)  SpO2 97%  LMP 11/30/2015 Wt Readings from Last 3 Encounters:  02/10/16 210 lb 8 oz (95.482 kg)  01/18/16 210 lb 8 oz (95.482 kg)  01/14/16 210 lb (95.255 kg)    General: Appears her stated age, obese in NAD. Cardiovascular: Normal rate and rhythm. S1,S2 noted.  No murmur, rubs or gallops noted. Pulmonary/Chest: Normal effort and positive vesicular breath sounds. No respiratory distress. No wheezes, rales or ronchi noted.  Neurological: Alert and oriented.Coordination normal.  Psychiatric: She is slightly anxious appearing today.  BMET    Component Value Date/Time   NA 141 01/18/2016 1217   K 3.6 01/18/2016 1217   CL 103 01/18/2016 1217   CO2 29 01/18/2016 1217   GLUCOSE 82 01/18/2016 1217   BUN 11 01/18/2016 1217   CREATININE 0.84 01/18/2016 1217   CREATININE 0.76 02/26/2015 1654   CREATININE 0.47* 04/28/2014 2340   CALCIUM 10.3 01/18/2016 1217   GFRNONAA >90 04/29/2014 0513   GFRAA >90 04/29/2014 0513    Lipid Panel     Component Value Date/Time   CHOL 154 08/20/2015 1425   TRIG 157.0* 08/20/2015 1425   HDL 35.20* 08/20/2015 1425   CHOLHDL 4 08/20/2015 1425   VLDL 31.4 08/20/2015 1425   LDLCALC 88 08/20/2015 1425    CBC    Component Value Date/Time   WBC 9.9 01/14/2016 1542   RBC 4.92 01/14/2016 1542   HGB 12.9 01/14/2016 1542   HCT 38.8 01/14/2016 1542   PLT 335.0 01/14/2016 1542   MCV 79.0 01/14/2016 1542   MCH 27.3 05/16/2014 0548   MCHC 33.2 01/14/2016 1542   RDW 15.4 01/14/2016 1542   LYMPHSABS 2.0 08/20/2015 1425   MONOABS 0.4 08/20/2015 1425   EOSABS 0.2 08/20/2015 1425   BASOSABS 0.0 08/20/2015 1425    Hgb A1C Lab Results  Component Value Date   HGBA1C 6.3 01/14/2016        Assessment & Plan:   Palpitations and paresthesia secondary to B12  deficiency:  Will recheck B12 in 6 weeks Continue oral supplement She will keep appt with Dr. Einar Gip for further eval of palpitations  RTC in 6 weeks for lab only appt

## 2016-02-11 DIAGNOSIS — R079 Chest pain, unspecified: Secondary | ICD-10-CM | POA: Diagnosis not present

## 2016-02-11 DIAGNOSIS — R002 Palpitations: Secondary | ICD-10-CM | POA: Diagnosis not present

## 2016-02-11 DIAGNOSIS — I10 Essential (primary) hypertension: Secondary | ICD-10-CM | POA: Diagnosis not present

## 2016-02-11 DIAGNOSIS — R0602 Shortness of breath: Secondary | ICD-10-CM | POA: Diagnosis not present

## 2016-02-17 DIAGNOSIS — I10 Essential (primary) hypertension: Secondary | ICD-10-CM | POA: Diagnosis not present

## 2016-02-17 DIAGNOSIS — R079 Chest pain, unspecified: Secondary | ICD-10-CM | POA: Diagnosis not present

## 2016-02-17 DIAGNOSIS — R9431 Abnormal electrocardiogram [ECG] [EKG]: Secondary | ICD-10-CM | POA: Diagnosis not present

## 2016-02-21 DIAGNOSIS — R7309 Other abnormal glucose: Secondary | ICD-10-CM | POA: Diagnosis not present

## 2016-02-21 DIAGNOSIS — I10 Essential (primary) hypertension: Secondary | ICD-10-CM | POA: Diagnosis not present

## 2016-02-21 DIAGNOSIS — R0789 Other chest pain: Secondary | ICD-10-CM | POA: Diagnosis not present

## 2016-02-21 DIAGNOSIS — R9431 Abnormal electrocardiogram [ECG] [EKG]: Secondary | ICD-10-CM | POA: Diagnosis not present

## 2016-02-25 ENCOUNTER — Other Ambulatory Visit: Payer: Self-pay | Admitting: Internal Medicine

## 2016-02-25 ENCOUNTER — Ambulatory Visit: Payer: Federal, State, Local not specified - PPO | Admitting: Internal Medicine

## 2016-03-02 DIAGNOSIS — K130 Diseases of lips: Secondary | ICD-10-CM | POA: Diagnosis not present

## 2016-03-02 DIAGNOSIS — L738 Other specified follicular disorders: Secondary | ICD-10-CM | POA: Diagnosis not present

## 2016-03-09 DIAGNOSIS — R0602 Shortness of breath: Secondary | ICD-10-CM | POA: Diagnosis not present

## 2016-03-09 DIAGNOSIS — I209 Angina pectoris, unspecified: Secondary | ICD-10-CM | POA: Diagnosis not present

## 2016-03-09 DIAGNOSIS — Z6839 Body mass index (BMI) 39.0-39.9, adult: Secondary | ICD-10-CM | POA: Diagnosis not present

## 2016-03-09 DIAGNOSIS — E785 Hyperlipidemia, unspecified: Secondary | ICD-10-CM | POA: Diagnosis not present

## 2016-03-14 ENCOUNTER — Telehealth: Payer: Self-pay | Admitting: Internal Medicine

## 2016-03-14 NOTE — Telephone Encounter (Signed)
Complex Case Management for job Pt wanted to know if you could fax this to [phone] Attn Stephanie Coppolino   [employer] for case management certification Please advise pt if you can fax letter or pt will be here tomorrow afternoon for labs

## 2016-03-14 NOTE — Telephone Encounter (Signed)
Letter has been completed. I will place in the front office for pt to pick up and I can fax per pt's request

## 2016-03-14 NOTE — Telephone Encounter (Signed)
Left message asking pt to call office  °

## 2016-03-14 NOTE — Telephone Encounter (Signed)
What is CCM?

## 2016-03-14 NOTE — Telephone Encounter (Signed)
Pt called she is schedule to take her ccm in august.  But with everything going on with her health and cardiology appointment she wanted to r/s to dec.  But needs a letter stating it is in her best interest to wait until dec pt would like to pick up tomorrow Please advise when ready for pick

## 2016-03-14 NOTE — Telephone Encounter (Signed)
Ok for letter

## 2016-03-15 ENCOUNTER — Other Ambulatory Visit (INDEPENDENT_AMBULATORY_CARE_PROVIDER_SITE_OTHER): Payer: Federal, State, Local not specified - PPO

## 2016-03-15 DIAGNOSIS — R202 Paresthesia of skin: Secondary | ICD-10-CM

## 2016-03-15 DIAGNOSIS — E538 Deficiency of other specified B group vitamins: Secondary | ICD-10-CM

## 2016-03-15 DIAGNOSIS — R002 Palpitations: Secondary | ICD-10-CM

## 2016-03-16 LAB — VITAMIN B12: Vitamin B-12: 1446 pg/mL — ABNORMAL HIGH (ref 211–911)

## 2016-03-16 LAB — CBC
HEMATOCRIT: 38.1 % (ref 36.0–46.0)
HEMOGLOBIN: 12.6 g/dL (ref 12.0–15.0)
MCHC: 33 g/dL (ref 30.0–36.0)
MCV: 79.1 fl (ref 78.0–100.0)
PLATELETS: 323 10*3/uL (ref 150.0–400.0)
RBC: 4.82 Mil/uL (ref 3.87–5.11)
RDW: 15.8 % — ABNORMAL HIGH (ref 11.5–15.5)
WBC: 9.2 10*3/uL (ref 4.0–10.5)

## 2016-03-16 LAB — POTASSIUM: Potassium: 4.2 mEq/L (ref 3.5–5.1)

## 2016-03-17 ENCOUNTER — Telehealth: Payer: Self-pay | Admitting: Internal Medicine

## 2016-03-17 ENCOUNTER — Other Ambulatory Visit: Payer: Self-pay

## 2016-03-17 NOTE — Telephone Encounter (Signed)
Patient called to get her lab results.  Patient said Debbie Walter can call her back with her lab results and leave a detailed message, if patient doesn't answer.

## 2016-03-23 MED ORDER — CYANOCOBALAMIN 500 MCG PO TABS: 500.0000 ug | ORAL_TABLET | Freq: Every day | ORAL | Status: DC

## 2016-03-23 NOTE — Addendum Note (Signed)
Addended by: Lurlean Nanny on: 03/23/2016 10:56 AM   Modules accepted: Orders, Medications

## 2016-03-23 NOTE — Addendum Note (Signed)
Addended by: Lurlean Nanny on: 03/23/2016 10:57 AM   Modules accepted: Orders

## 2016-03-24 ENCOUNTER — Other Ambulatory Visit: Payer: Self-pay | Admitting: Internal Medicine

## 2016-03-28 ENCOUNTER — Other Ambulatory Visit: Payer: Self-pay

## 2016-03-28 ENCOUNTER — Telehealth: Payer: Self-pay

## 2016-03-28 NOTE — Telephone Encounter (Signed)
Pt left v/m; cardiologist stopped K on 03/24/16; recently had K level and was good. Pt started cholesterol med last week also. Now pt having leg cramps and pt request order to have K level rechecked at [facility] on 03/29/16. Pt has hx of low K. Pt request cb.

## 2016-03-28 NOTE — Telephone Encounter (Signed)
Agree with advise given.

## 2016-03-28 NOTE — Telephone Encounter (Signed)
Spoke to pt and advised pt to contact cardiology as Rollene Fare is out of the office and because they are who prescribed statin as this could be related to that as well---pt states she has a f/u appt with cardiology next week

## 2016-04-04 DIAGNOSIS — R0602 Shortness of breath: Secondary | ICD-10-CM | POA: Diagnosis not present

## 2016-04-04 DIAGNOSIS — R739 Hyperglycemia, unspecified: Secondary | ICD-10-CM | POA: Diagnosis not present

## 2016-04-04 DIAGNOSIS — I209 Angina pectoris, unspecified: Secondary | ICD-10-CM | POA: Diagnosis not present

## 2016-04-04 DIAGNOSIS — I10 Essential (primary) hypertension: Secondary | ICD-10-CM | POA: Diagnosis not present

## 2016-04-10 ENCOUNTER — Other Ambulatory Visit: Payer: Self-pay | Admitting: Internal Medicine

## 2016-04-10 NOTE — Telephone Encounter (Signed)
PT CALLED BACK STATING SHE HAS NOT STOPPED HER CHOL MEDS.  SHE STATED SHE IS STILL HAVE LEG CRAMPS.  SHE WANTS TO KNOW IF SHE CAN COME IN ON ONE OF THE LATE NIGHT THIS WEEK TO HAVE LABS DONE FOR POTASSIUM  PLEASE ADVISE  YOU CAN LEAVE MESSAGE IF SHE DOESN'T ANSWER

## 2016-04-11 NOTE — Telephone Encounter (Signed)
Noted, she can have labs done here as long as cardiology sends order

## 2016-04-11 NOTE — Telephone Encounter (Signed)
Pt called back regarding lab work Best number [phone] Can leave message  She stated phone disconnected

## 2016-04-11 NOTE — Telephone Encounter (Signed)
Spoke to pt and she stated she spoke with Cardiology office yesterday and advised her to take Potassium 31meq daily and to f/u for repeat level in 1 week.... Pt is advised that I did not see Cardiology office in our system for possible order, so she will need to contact them as cardiology is managing her BP, potassium and chol lowering medication... In the middle of me explaining to the pt that cardiology will have to order lab and f/u with them being they are the one making changes and managing meds--- Pt hung up on me

## 2016-04-12 NOTE — Telephone Encounter (Signed)
Left message on voicemail.

## 2016-04-17 ENCOUNTER — Encounter: Payer: Self-pay | Admitting: Family Medicine

## 2016-04-24 ENCOUNTER — Other Ambulatory Visit: Payer: Self-pay | Admitting: Internal Medicine

## 2016-04-25 NOTE — Telephone Encounter (Signed)
Last filled 02/07/2016--please advise

## 2016-04-25 NOTE — Telephone Encounter (Signed)
Ok to phone in Xanax 

## 2016-04-25 NOTE — Telephone Encounter (Signed)
Rx called in to pharmacy. 

## 2016-05-02 DIAGNOSIS — R252 Cramp and spasm: Secondary | ICD-10-CM | POA: Diagnosis not present

## 2016-05-11 ENCOUNTER — Ambulatory Visit (INDEPENDENT_AMBULATORY_CARE_PROVIDER_SITE_OTHER): Payer: Self-pay | Admitting: Internal Medicine

## 2016-05-11 ENCOUNTER — Encounter: Payer: Self-pay | Admitting: Internal Medicine

## 2016-05-11 ENCOUNTER — Telehealth: Payer: Self-pay | Admitting: Internal Medicine

## 2016-05-11 VITALS — BP 136/86 | HR 77 | Temp 98.7°F | Wt 196.0 lb

## 2016-05-11 DIAGNOSIS — J302 Other seasonal allergic rhinitis: Secondary | ICD-10-CM

## 2016-05-11 DIAGNOSIS — F411 Generalized anxiety disorder: Secondary | ICD-10-CM

## 2016-05-11 DIAGNOSIS — Z7689 Persons encountering health services in other specified circumstances: Secondary | ICD-10-CM

## 2016-05-11 MED ORDER — HYDROCODONE-HOMATROPINE 5-1.5 MG/5ML PO SYRP: 5.0000 mL | ORAL_SOLUTION | Freq: Three times a day (TID) | ORAL | 0 refills | Status: DC | PRN

## 2016-05-11 NOTE — Telephone Encounter (Signed)
FMLA in [place] for review and signature.  Please return to [facility]

## 2016-05-11 NOTE — Patient Instructions (Signed)

## 2016-05-11 NOTE — Telephone Encounter (Signed)
Done, given back to [facility]

## 2016-05-11 NOTE — Progress Notes (Signed)
Pre visit review using our clinic review tool, if applicable. No additional management support is needed unless otherwise documented below in the visit note. 

## 2016-05-11 NOTE — Progress Notes (Signed)
Subjective:    Patient ID: Debbie Walter, female    DOB: 05/19/1981, 35 y.o.   MRN: [id number]  HPI  Pt presents to the clinic today with c/o scratchy throat and cough. This started 2 days ago. She denies difficulty swallowing. The cough is nonproductive. She reports Tuesday night, she felt like her throat was closing up and she felt like she was having trouble breathing. She started to get really anxious but was able to calm herself down. She denies burning sensation in her throat, nausea or abdominal pain. She is taking Protonix daily for reflux and reports this is different. She denies runny nose, nasal congestion or ear fullness. She has not taken anything OTC for this. She has not had sick contacts that she is aware of.  Review of Systems      Past Medical History:  Diagnosis Date  . Fibroids   . GERD (gastroesophageal reflux disease)    takes protonix  . Hypertension   . Kidney stones 5/14  . Panic attacks   . PCOS (polycystic ovarian syndrome)    takes metformin for this    Current Outpatient Prescriptions  Medication Sig Dispense Refill  . ALPRAZolam (XANAX) 0.25 MG tablet TAKE 1 TABLET BY MOUTH EVERY DAY AS NEEDED 30 tablet 0  . busPIRone (BUSPAR) 5 MG tablet Take 1 tablet (5 mg total) by mouth daily. (Patient taking differently: Take 5 mg by mouth daily. Pt reports she is not taking daily) 30 tablet 2  . cholecalciferol (VITAMIN D) 1000 UNITS tablet Take 1,000 Units by mouth every other day.    . cyanocobalamin 500 MCG tablet Take 1 tablet (500 mcg total) by mouth daily. (Patient taking differently: Take 500 mcg by mouth every other day. )    . metFORMIN (GLUCOPHAGE) 500 MG tablet Take 1 tablet (500 mg total) by mouth 2 (two) times daily with a meal. 180 tablet 3  . metoprolol (LOPRESSOR) 50 MG tablet TAKE 1 TABLET BY MOUTH TWICE A DAY 180 tablet 1  . nitroGLYCERIN (NITROSTAT) 0.4 MG SL tablet DISSOLVE 1 TABLET UNDER THE TONUGE EVERY MINUTE AS NEEDED FOR CHEST PAIN  1  .  pantoprazole (PROTONIX) 40 MG tablet TAKE 1 TABLET BY MOUTH EVERY DAY 90 tablet 0  . potassium chloride (KLOR-CON 10) 10 MEQ tablet Take 10 mEq by mouth 2 (two) times daily.    . Prenat w/o A-FeCbGl-DSS-FA-DHA (CITRANATAL ASSURE) 35-1 & 300 MG tablet Take 1 tablet by mouth daily.  11  . valsartan-hydrochlorothiazide (DIOVAN-HCT) 160-12.5 MG tablet Take 1 tablet by mouth every morning.  1  . HYDROcodone-homatropine (HYCODAN) 5-1.5 MG/5ML syrup Take 5 mLs by mouth every 8 (eight) hours as needed for cough. 120 mL 0   No current facility-administered medications for this visit.     Allergies  Allergen Reactions  . Procardia [Nifedipine] Other (See Comments)    headache  . Wellbutrin [Bupropion] Other (See Comments)    "Makes my blood pressure go up - I don't ever want to take it again"  . Labetalol Other (See Comments)    "makes me feel like things are crawling through my head"  . Zithromax [Azithromycin] Other (See Comments)    Gi upset.  Diarrhea    Family History  Problem Relation Age of Onset  . Hyperlipidemia Mother   . Heart disease Mother   . Diabetes Mother   . Hypertension Mother   . Hyperlipidemia Father   . Hypertension Father   . Cancer Father  rectal  . Hypertension Sister   . Anesthesia problems Neg Hx   . Hypotension Neg Hx   . Malignant hyperthermia Neg Hx   . Pseudochol deficiency Neg Hx     Social History   Social History  . Marital status: Married    Spouse name: N/A  . Number of children: N/A  . Years of education: N/A   Occupational History  . Not on file.   Social History Main Topics  . Smoking status: Never Smoker  . Smokeless tobacco: Never Used  . Alcohol use No  . Drug use: No  . Sexual activity: Yes    Birth control/ protection: None   Other Topics Concern  . Not on file   Social History Narrative  . No narrative on file     Constitutional: Denies fever, malaise, fatigue, headache or abrupt weight changes.  HEENT: Pt reports  sore throat. Denies eye pain, eye redness, ear pain, ringing in the ears, wax buildup, runny nose, nasal congestion, bloody nose. Respiratory: Pt reports cough. Denies difficulty breathing, shortness of breath, or sputum production.   Cardiovascular: Denies chest pain, chest tightness, palpitations or swelling in the hands or feet.  Gastrointestinal: Denies abdominal pain, bloating, constipation, diarrhea or blood in the stool.  Psych: Pt reports anxiety. Denies depression, SI/HI.  No other specific complaints in a complete review of systems (except as listed in HPI above).  Objective:   Physical Exam   BP 136/86   Pulse 77   Temp 98.7 F (37.1 C) (Oral)   Wt 196 lb (88.9 kg)   SpO2 98%   BMI 36.43 kg/m  Wt Readings from Last 3 Encounters:  05/11/16 196 lb (88.9 kg)  02/10/16 210 lb 8 oz (95.5 kg)  01/18/16 210 lb 8 oz (95.5 kg)    General: Appears her stated age, obese in NAD. HEENT: Head: normal shape and size, no sinus tenderness noted; Eyes: sclera white, no icterus, conjunctiva pink; Ears: Tm's gray and intact, normal light reflex, + serous effusion on the left; Nose: mucosa oggypink and moist, septum midline; Throat/Mouth: Teeth present, mucosa pink and moist, + PND, no exudate, lesions or ulcerations noted.  Neck:  No adenopathy noted. Cardiovascular: Normal rate and rhythm. S1,S2 noted.   Pulmonary/Chest: Normal effort and positive vesicular breath sounds. No respiratory distress. No wheezes, rales or ronchi noted.  Abdomen: Soft and nontender.  Psychiatric:  She is anxious appearing today.  BMET    Component Value Date/Time   NA 141 01/18/2016 1217   K 4.2 03/15/2016 1757   CL 103 01/18/2016 1217   CO2 29 01/18/2016 1217   GLUCOSE 82 01/18/2016 1217   BUN 11 01/18/2016 1217   CREATININE 0.84 01/18/2016 1217   CREATININE 0.76 02/26/2015 1654   CALCIUM 10.3 01/18/2016 1217   GFRNONAA >90 04/29/2014 0513   GFRAA >90 04/29/2014 0513    Lipid Panel       Component Value Date/Time   CHOL 154 08/20/2015 1425   TRIG 157.0 (H) 08/20/2015 1425   HDL 35.20 (L) 08/20/2015 1425   CHOLHDL 4 08/20/2015 1425   VLDL 31.4 08/20/2015 1425   LDLCALC 88 08/20/2015 1425    CBC    Component Value Date/Time   WBC 9.2 03/15/2016 1757   RBC 4.82 03/15/2016 1757   HGB 12.6 03/15/2016 1757   HCT 38.1 03/15/2016 1757   PLT 323.0 03/15/2016 1757   MCV 79.1 03/15/2016 1757   MCH 27.3 05/16/2014 0548   MCHC 33.0  03/15/2016 1757   RDW 15.8 (H) 03/15/2016 1757   LYMPHSABS 2.0 08/20/2015 1425   MONOABS 0.4 08/20/2015 1425   EOSABS 0.2 08/20/2015 1425   BASOSABS 0.0 08/20/2015 1425    Hgb A1C Lab Results  Component Value Date   HGBA1C 6.3 01/14/2016           Assessment & Plan:   Seasonal allergies:  Start Mucinex 600 mg every 12 hours Flonase 1 spray each nostril daily as needed RX for Hycodan at night for cough Work note provided  Anxiety:  No change in treatment regimen at this time Will monitor  Webb Silversmith, NP

## 2016-05-12 NOTE — Telephone Encounter (Signed)
FMLA Faxed to [place]  LVM for pt letting her know ppw was faxed and copy up front for her to pick up.  Copy for office file Copy for scan Copy for charge

## 2016-05-15 DIAGNOSIS — K080 Exfoliation of teeth due to systemic causes: Secondary | ICD-10-CM | POA: Diagnosis not present

## 2016-06-15 ENCOUNTER — Encounter: Payer: Self-pay | Admitting: Internal Medicine

## 2016-06-15 ENCOUNTER — Ambulatory Visit (INDEPENDENT_AMBULATORY_CARE_PROVIDER_SITE_OTHER): Payer: Federal, State, Local not specified - PPO | Admitting: Internal Medicine

## 2016-06-15 VITALS — BP 136/90 | HR 74 | Temp 98.8°F | Wt 199.2 lb

## 2016-06-15 DIAGNOSIS — F411 Generalized anxiety disorder: Secondary | ICD-10-CM

## 2016-06-15 MED ORDER — ESCITALOPRAM OXALATE 5 MG PO TABS: 5.0000 mg | ORAL_TABLET | Freq: Every day | ORAL | 2 refills | Status: DC

## 2016-06-15 NOTE — Patient Instructions (Signed)
Generalized Anxiety Disorder Generalized anxiety disorder (GAD) is a mental disorder. It interferes with life functions, including relationships, work, and school. GAD is different from normal anxiety, which everyone experiences at some point in their lives in response to specific life events and activities. Normal anxiety actually helps us prepare for and get through these life events and activities. Normal anxiety goes away after the event or activity is over.  GAD causes anxiety that is not necessarily related to specific events or activities. It also causes excess anxiety in proportion to specific events or activities. The anxiety associated with GAD is also difficult to control. GAD can vary from mild to severe. People with severe GAD can have intense waves of anxiety with physical symptoms (panic attacks).  SYMPTOMS The anxiety and worry associated with GAD are difficult to control. This anxiety and worry are related to many life events and activities and also occur more days than not for 6 months or longer. People with GAD also have three or more of the following symptoms (one or more in children):  Restlessness.   Fatigue.  Difficulty concentrating.   Irritability.  Muscle tension.  Difficulty sleeping or unsatisfying sleep. DIAGNOSIS GAD is diagnosed through an assessment by your health care provider. Your health care provider will ask you questions aboutyour mood,physical symptoms, and events in your life. Your health care provider may ask you about your medical history and use of alcohol or drugs, including prescription medicines. Your health care provider may also do a physical exam and blood tests. Certain medical conditions and the use of certain substances can cause symptoms similar to those associated with GAD. Your health care provider may refer you to a mental health specialist for further evaluation. TREATMENT The following therapies are usually used to treat GAD:    Medication. Antidepressant medication usually is prescribed for long-term daily control. Antianxiety medicines may be added in severe cases, especially when panic attacks occur.   Talk therapy (psychotherapy). Certain types of talk therapy can be helpful in treating GAD by providing support, education, and guidance. A form of talk therapy called cognitive behavioral therapy can teach you healthy ways to think about and react to daily life events and activities.  Stress managementtechniques. These include yoga, meditation, and exercise and can be very helpful when they are practiced regularly. A mental health specialist can help determine which treatment is best for you. Some people see improvement with one therapy. However, other people require a combination of therapies.   This information is not intended to replace advice given to you by your health care provider. Make sure you discuss any questions you have with your health care provider.   Document Released: 12/30/2012 Document Revised: 09/25/2014 Document Reviewed: 12/30/2012 Elsevier Interactive Patient Education 2016 Elsevier Inc.  

## 2016-06-15 NOTE — Progress Notes (Signed)
Subjective:    Patient ID: Debbie Walter, female    DOB: Mar 13, 1981, 35 y.o.   MRN: [id number]  HPI  Pt presents to the clinic today to follow up anxiety. She reports her anxiety has increased recently secondary to work related stress. She has a Psychologist, educational and they do no work well together. She is currently trying to find a new job. She is prescribed Buspar 5 mg daily, but she reports she stopped taking it because it made her feel shaky. She is also prescribed Xanax which she takes. She has taken Wellbutrin in the past but reports it cause hyperglycemia. She has taken Zoloft, but stopped taking it secondary to weight gain. CSRS reviewed- no other prescribers. She denies depression, SI/HI.  Review of Systems      Past Medical History:  Diagnosis Date  . Fibroids   . GERD (gastroesophageal reflux disease)    takes protonix  . Hypertension   . Kidney stones 5/14  . Panic attacks   . PCOS (polycystic ovarian syndrome)    takes metformin for this    Current Outpatient Prescriptions  Medication Sig Dispense Refill  . ALPRAZolam (XANAX) 0.25 MG tablet TAKE 1 TABLET BY MOUTH EVERY DAY AS NEEDED 30 tablet 0  . busPIRone (BUSPAR) 5 MG tablet Take 1 tablet (5 mg total) by mouth daily. (Patient taking differently: Take 5 mg by mouth daily. Pt reports she is not taking daily) 30 tablet 2  . cholecalciferol (VITAMIN D) 1000 UNITS tablet Take 1,000 Units by mouth every other day.    . cyanocobalamin 500 MCG tablet Take 1 tablet (500 mcg total) by mouth daily. (Patient taking differently: Take 500 mcg by mouth every other day. )    . HYDROcodone-homatropine (HYCODAN) 5-1.5 MG/5ML syrup Take 5 mLs by mouth every 8 (eight) hours as needed for cough. 120 mL 0  . metFORMIN (GLUCOPHAGE) 500 MG tablet Take 1 tablet (500 mg total) by mouth 2 (two) times daily with a meal. 180 tablet 3  . metoprolol (LOPRESSOR) 50 MG tablet TAKE 1 TABLET BY MOUTH TWICE A DAY 180 tablet 1  . nitroGLYCERIN (NITROSTAT) 0.4  MG SL tablet DISSOLVE 1 TABLET UNDER THE TONUGE EVERY MINUTE AS NEEDED FOR CHEST PAIN  1  . pantoprazole (PROTONIX) 40 MG tablet TAKE 1 TABLET BY MOUTH EVERY DAY 90 tablet 0  . potassium chloride (KLOR-CON 10) 10 MEQ tablet Take 10 mEq by mouth 2 (two) times daily.    . Prenat w/o A-FeCbGl-DSS-FA-DHA (CITRANATAL ASSURE) 35-1 & 300 MG tablet Take 1 tablet by mouth daily.  11  . valsartan-hydrochlorothiazide (DIOVAN-HCT) 160-12.5 MG tablet Take 1 tablet by mouth every morning.  1   No current facility-administered medications for this visit.     Allergies  Allergen Reactions  . Procardia [Nifedipine] Other (See Comments)    headache  . Wellbutrin [Bupropion] Other (See Comments)    "Makes my blood pressure go up - I don't ever want to take it again"  . Labetalol Other (See Comments)    "makes me feel like things are crawling through my head"  . Zithromax [Azithromycin] Other (See Comments)    Gi upset.  Diarrhea    Family History  Problem Relation Age of Onset  . Hyperlipidemia Mother   . Heart disease Mother   . Diabetes Mother   . Hypertension Mother   . Hyperlipidemia Father   . Hypertension Father   . Cancer Father     rectal  .  Hypertension Sister   . Anesthesia problems Neg Hx   . Hypotension Neg Hx   . Malignant hyperthermia Neg Hx   . Pseudochol deficiency Neg Hx     Social History   Social History  . Marital status: Married    Spouse name: N/A  . Number of children: N/A  . Years of education: N/A   Occupational History  . Not on file.   Social History Main Topics  . Smoking status: Never Smoker  . Smokeless tobacco: Never Used  . Alcohol use No  . Drug use: No  . Sexual activity: Yes    Birth control/ protection: None   Other Topics Concern  . Not on file   Social History Narrative  . No narrative on file     Constitutional: Denies fever, malaise, fatigue, headache or abrupt weight changes.  Respiratory: Denies difficulty breathing, shortness  of breath, cough or sputum production.   Cardiovascular: Denies chest pain, chest tightness, palpitations or swelling in the hands or feet.  Neurological: Denies dizziness, difficulty with memory, difficulty with speech or problems with balance and coordination.  Psych: Pt reports anxiety. Denies depression, SI/HI.  No other specific complaints in a complete review of systems (except as listed in HPI above).   Objective:   Physical Exam  BP 136/90   Pulse 74   Temp 98.8 F (37.1 C) (Oral)   Wt 199 lb 4 oz (90.4 kg)   SpO2 98%   BMI 37.04 kg/m  Wt Readings from Last 3 Encounters:  06/15/16 199 lb 4 oz (90.4 kg)  05/11/16 196 lb (88.9 kg)  02/10/16 210 lb 8 oz (95.5 kg)    General: Appears her stated age, obese in NAD. Cardiovascular: Normal rate and rhythm. S1,S2 noted.  No murmur, rubs or gallops noted.  Pulmonary/Chest: Normal effort and positive vesicular breath sounds. No respiratory distress. No wheezes, rales or ronchi noted.  Neurological: Alert and oriented.  Psychiatric: She is anxious appearing and tearful today.  BMET    Component Value Date/Time   NA 141 01/18/2016 1217   K 4.2 03/15/2016 1757   CL 103 01/18/2016 1217   CO2 29 01/18/2016 1217   GLUCOSE 82 01/18/2016 1217   BUN 11 01/18/2016 1217   CREATININE 0.84 01/18/2016 1217   CREATININE 0.76 02/26/2015 1654   CALCIUM 10.3 01/18/2016 1217   GFRNONAA >90 04/29/2014 0513   GFRAA >90 04/29/2014 0513    Lipid Panel     Component Value Date/Time   CHOL 154 08/20/2015 1425   TRIG 157.0 (H) 08/20/2015 1425   HDL 35.20 (L) 08/20/2015 1425   CHOLHDL 4 08/20/2015 1425   VLDL 31.4 08/20/2015 1425   LDLCALC 88 08/20/2015 1425    CBC    Component Value Date/Time   WBC 9.2 03/15/2016 1757   RBC 4.82 03/15/2016 1757   HGB 12.6 03/15/2016 1757   HCT 38.1 03/15/2016 1757   PLT 323.0 03/15/2016 1757   MCV 79.1 03/15/2016 1757   MCH 27.3 05/16/2014 0548   MCHC 33.0 03/15/2016 1757   RDW 15.8 (H)  03/15/2016 1757   LYMPHSABS 2.0 08/20/2015 1425   MONOABS 0.4 08/20/2015 1425   EOSABS 0.2 08/20/2015 1425   BASOSABS 0.0 08/20/2015 1425    Hgb A1C Lab Results  Component Value Date   HGBA1C 6.3 01/14/2016        Assessment & Plan:

## 2016-06-15 NOTE — Assessment & Plan Note (Addendum)
Deteriorated secondary to work related stress She is actively looking for a new job Offered referral to therapy for CBT but she declines today eRx for Lexapro 5 mg daily- she will update me in 4 weeks and let me know how she is doing Work note provided

## 2016-07-02 ENCOUNTER — Other Ambulatory Visit: Payer: Self-pay | Admitting: Internal Medicine

## 2016-07-03 NOTE — Telephone Encounter (Signed)
Ok to phone in Xanax  *please change class to "phone in" before sending RX request

## 2016-07-03 NOTE — Telephone Encounter (Signed)
Last filled 04/25/2016--please advise

## 2016-07-04 NOTE — Telephone Encounter (Signed)
Rx called in to pharmacy. 

## 2016-07-07 ENCOUNTER — Other Ambulatory Visit: Payer: Self-pay | Admitting: Internal Medicine

## 2016-07-24 DIAGNOSIS — R0602 Shortness of breath: Secondary | ICD-10-CM | POA: Diagnosis not present

## 2016-07-24 DIAGNOSIS — Z6839 Body mass index (BMI) 39.0-39.9, adult: Secondary | ICD-10-CM | POA: Diagnosis not present

## 2016-07-24 DIAGNOSIS — I209 Angina pectoris, unspecified: Secondary | ICD-10-CM | POA: Diagnosis not present

## 2016-07-24 DIAGNOSIS — I10 Essential (primary) hypertension: Secondary | ICD-10-CM | POA: Diagnosis not present

## 2016-07-24 DIAGNOSIS — H04123 Dry eye syndrome of bilateral lacrimal glands: Secondary | ICD-10-CM | POA: Diagnosis not present

## 2016-07-31 DIAGNOSIS — L218 Other seborrheic dermatitis: Secondary | ICD-10-CM | POA: Diagnosis not present

## 2016-08-23 ENCOUNTER — Encounter: Payer: Self-pay | Admitting: Family Medicine

## 2016-08-23 ENCOUNTER — Telehealth: Payer: Self-pay | Admitting: Internal Medicine

## 2016-08-23 ENCOUNTER — Ambulatory Visit (INDEPENDENT_AMBULATORY_CARE_PROVIDER_SITE_OTHER): Payer: Federal, State, Local not specified - PPO | Admitting: Family Medicine

## 2016-08-23 VITALS — BP 136/84 | HR 74 | Temp 98.8°F | Ht 61.5 in | Wt 205.4 lb

## 2016-08-23 DIAGNOSIS — B001 Herpesviral vesicular dermatitis: Secondary | ICD-10-CM

## 2016-08-23 DIAGNOSIS — H1032 Unspecified acute conjunctivitis, left eye: Secondary | ICD-10-CM

## 2016-08-23 MED ORDER — VALACYCLOVIR HCL 1 G PO TABS: 2000.0000 mg | ORAL_TABLET | Freq: Two times a day (BID) | ORAL | 3 refills | Status: DC

## 2016-08-23 MED ORDER — GENTAMICIN SULFATE 0.3 % OP SOLN: 1.0000 [drp] | Freq: Four times a day (QID) | OPHTHALMIC | 0 refills | Status: DC

## 2016-08-23 NOTE — Progress Notes (Signed)
Subjective:    Patient ID: Debbie Walter, female    DOB: [birth year], 35 y.o.   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 Acute conjunctivitis of left eye 08/23/2016  . Recurrent cold sores 08/23/2016  . Palpitations 01/18/2016  . Paresthesia of both hands 01/18/2016  . Serum calcium elevated 01/17/2016  . Multiple thyroid nodules 01/17/2016  . Overactive thyroid gland 09/22/2015  . HTN (hypertension) 02/10/2015  . PCOS (polycystic ovarian syndrome) 02/10/2015  . Generalized anxiety disorder 02/10/2015  . GERD (gastroesophageal reflux disease) 02/10/2015  . Elevated urine levels of catecholamines 01/14/2015  . H/O hyperparathyroidism 01/14/2015   Past Medical History:  Diagnosis Date  . Fibroids   . GERD (gastroesophageal reflux disease)    takes protonix  . Hypertension   . Kidney stones 5/14  . Panic attacks   . PCOS (polycystic ovarian syndrome)    takes metformin for this   Past Surgical History:  Procedure Laterality Date  . CESAREAN SECTION    . CESAREAN SECTION WITH BILATERAL TUBAL LIGATION Bilateral 05/15/2014   Procedure: CESAREAN SECTION WITH BILATERAL TUBAL LIGATION;  Surgeon: Cyril Mourning, MD;  Location: [facility];  Service: Obstetrics;  Laterality: Bilateral;  repeat  edc 06/10/14  . HYSTEROSCOPY    . PARATHYROIDECTOMY    . PARATHYROIDECTOMY     Social History  Substance Use Topics  . Smoking status: Never Smoker  .  Smokeless tobacco: Never Used  . Alcohol use No   Family History  Problem Relation Age of Onset  . Hyperlipidemia Mother   . Heart disease Mother   . Diabetes Mother   . Hypertension Mother   . Hyperlipidemia Father   . Hypertension Father   . Cancer Father     rectal  . Hypertension Sister   . Anesthesia problems Neg Hx   . Hypotension Neg Hx   . Malignant hyperthermia Neg Hx   . Pseudochol deficiency Neg Hx    Allergies  Allergen Reactions  . Procardia [Nifedipine] Other (See Comments)    headache  . Wellbutrin [Bupropion] Other (See Comments)    "Makes my blood pressure go up - I don't ever want to take it again"  . Labetalol Other (See Comments)    "makes me feel like things are crawling through my head"  . Zithromax [Azithromycin] Other (See Comments)    Gi upset.  Diarrhea   Current Outpatient Prescriptions on File Prior to Visit  Medication Sig Dispense Refill  . ALPRAZolam (XANAX) 0.25 MG tablet TAKE 1 TABLET EVERY DAY AS NEEDED 30 tablet 0  . cholecalciferol (VITAMIN D) 1000 UNITS tablet Take 1,000 Units by mouth every other day.    . metFORMIN (GLUCOPHAGE) 500 MG tablet Take 1 tablet (500 mg total) by mouth 2 (two) times daily with a meal. 180 tablet 3  . metoprolol (LOPRESSOR) 50 MG  tablet TAKE 1 TABLET BY MOUTH TWICE A DAY 180 tablet 1  . pantoprazole (PROTONIX) 40 MG tablet TAKE 1 TABLET BY MOUTH EVERY DAY 90 tablet 0  . potassium chloride (KLOR-CON 10) 10 MEQ tablet Take 10 mEq by mouth 2 (two) times daily.    . Prenat w/o A-FeCbGl-DSS-FA-DHA (CITRANATAL ASSURE) 35-1 & 300 MG tablet Take 1 tablet by mouth daily.  11  . valsartan-hydrochlorothiazide (DIOVAN-HCT) 160-12.5 MG tablet Take 1 tablet by mouth every morning.  1  . cyanocobalamin 500 MCG tablet Take 1 tablet (500 mcg total) by mouth daily. (Patient not taking: Reported on 08/23/2016)    . nitroGLYCERIN (NITROSTAT) 0.4 MG SL tablet DISSOLVE 1 TABLET UNDER THE TONUGE EVERY MINUTE AS NEEDED FOR CHEST PAIN  1    No current facility-administered medications on file prior to visit.      Review of Systems  Constitutional: Positive for appetite change and fatigue. Negative for fever.  HENT: Positive for congestion, postnasal drip, rhinorrhea, sinus pressure, sneezing and sore throat. Negative for ear pain.   Eyes: Positive for discharge, redness and itching. Negative for pain.  Respiratory: Positive for cough. Negative for shortness of breath, wheezing and stridor.   Cardiovascular: Negative for chest pain.  Gastrointestinal: Negative for diarrhea, nausea and vomiting.  Genitourinary: Negative for frequency, hematuria and urgency.  Musculoskeletal: Negative for arthralgias and myalgias.  Skin: Negative for rash.  Neurological: Negative for dizziness, weakness, light-headedness and headaches.  Psychiatric/Behavioral: Negative for confusion and dysphoric mood.  pos for stressors      Objective:   Physical Exam  Constitutional: She appears well-developed and well-nourished. No distress.  HENT:  Head: Normocephalic and atraumatic.  Right Ear: External ear normal.  Left Ear: External ear normal.  Mouth/Throat: Oropharynx is clear and moist.  Nares are injected and congested  No sinus tenderness Clear rhinorrhea and post nasal drip   Eyes: EOM are normal. Pupils are equal, round, and reactive to light. Right eye exhibits no discharge. Left eye exhibits discharge. No scleral icterus.  Mild conjunctival injection w/o obv injury in L eye  No swelling  Cloudy discharge Grossly nl vision   Neck: Normal range of motion. Neck supple.  Cardiovascular: Normal rate and normal heart sounds.   Pulmonary/Chest: Effort normal and breath sounds normal. No respiratory distress. She has no wheezes. She has no rales. She exhibits no tenderness.  Lymphadenopathy:    She has no cervical adenopathy.  Neurological: She is alert.  Skin: Skin is warm and dry. No rash noted.  Dry/healing vesicle on L upper lip  consistent with a cold sore No rash  Psychiatric: She has a normal mood and affect.          Assessment & Plan:   Problem List Items Addressed This Visit      Digestive   Recurrent cold sores    Px valcyclovir to use 2 g bid for 1 d prn for cold sores  Her current one is already drying /healing      Relevant Medications   valACYclovir (VALTREX) 1000 MG tablet     Other   Acute conjunctivitis of left eye    This appears to be recurrent and responding to the original gentamycin drops given- that she started back with  I renewed the px Unsure if also viral component since she is also suffering from uri symptoms  No signs of shingles  Recommend f/u with opthy as planned if this does not resolve or re occurs  Asked  to alert [place] if worse symptoms or vision change Update if not starting to improve in a week or if worsening

## 2016-08-23 NOTE — Telephone Encounter (Signed)
Patient made an appointment with Dr.Tower today at 6:00.  Patient wants to know if Rollene Fare would refill her rx for pink eye.  Patient said she has green drainage from her eye and it's swollen and itchy. Patient said she has had it twice this year and her dermatologist gave her medication.  Patient uses [facility] [address] I let patient know she would probably need to keep appointment with Dr.Tower,but she wanted me to send message to [place]. Please call patient and she said a detailed message can be left on her voice mail.

## 2016-08-23 NOTE — Patient Instructions (Addendum)
Use the gentamycin eye drops as directed  If this continues to re occur- get to your eye doctor  Try the valcyclovir for the cold sore   If symptoms worsen or vision changes - please alert [place]   I think you have a cold  Drink lots of fluids and try to get rest

## 2016-08-23 NOTE — Progress Notes (Signed)
Pre visit review using our clinic review tool, if applicable. No additional management support is needed unless otherwise documented below in the visit note. 

## 2016-08-23 NOTE — Telephone Encounter (Signed)
Pt has appt for this today at 6pm

## 2016-08-23 NOTE — Telephone Encounter (Signed)
Will not refill, she should keep her appt

## 2016-08-24 ENCOUNTER — Telehealth: Payer: Self-pay

## 2016-08-24 NOTE — Telephone Encounter (Signed)
We can discuss this at her upcoming appt

## 2016-08-24 NOTE — Assessment & Plan Note (Signed)
This appears to be recurrent and responding to the original gentamycin drops given- that she started back with  I renewed the px Unsure if also viral component since she is also suffering from uri symptoms  No signs of shingles  Recommend f/u with opthy as planned if this does not resolve or re occurs  Asked to alert [place] if worse symptoms or vision change Update if not starting to improve in a week or if worsening

## 2016-08-24 NOTE — Telephone Encounter (Signed)
Pt left v/m; pt had stopped lexapro 2 days after starting the lexapro and pt request to restart the buspar. Pt last seen 06/15/16 and has CPX scheduled for 09/01/16. Pt request cb.

## 2016-08-24 NOTE — Assessment & Plan Note (Signed)
Px valcyclovir to use 2 g bid for 1 d prn for cold sores  Her current one is already drying Debbie Walter

## 2016-08-25 ENCOUNTER — Other Ambulatory Visit: Payer: Self-pay | Admitting: Internal Medicine

## 2016-08-25 NOTE — Telephone Encounter (Signed)
I need to know more details. What was the issue with the Lexapro? That's why I wanted to wait until her appt, so we could discuss it more in depth.

## 2016-08-25 NOTE — Telephone Encounter (Signed)
Patient was told to wait until her appointment.  Patient said she'd like the Buspar refilled before the appointment because she's feeling anxious. Patient said if she doesn't answer her phone to leave a detailed voice mail.  Patient can be reached at [phone].

## 2016-09-01 ENCOUNTER — Ambulatory Visit (INDEPENDENT_AMBULATORY_CARE_PROVIDER_SITE_OTHER): Payer: Federal, State, Local not specified - PPO | Admitting: Internal Medicine

## 2016-09-01 ENCOUNTER — Encounter: Payer: Self-pay | Admitting: Internal Medicine

## 2016-09-01 VITALS — BP 126/80 | HR 76 | Temp 98.6°F | Ht 61.5 in | Wt 205.0 lb

## 2016-09-01 DIAGNOSIS — Z0000 Encounter for general adult medical examination without abnormal findings: Secondary | ICD-10-CM

## 2016-09-01 DIAGNOSIS — K219 Gastro-esophageal reflux disease without esophagitis: Secondary | ICD-10-CM

## 2016-09-01 DIAGNOSIS — E282 Polycystic ovarian syndrome: Secondary | ICD-10-CM

## 2016-09-01 DIAGNOSIS — Z8639 Personal history of other endocrine, nutritional and metabolic disease: Secondary | ICD-10-CM | POA: Diagnosis not present

## 2016-09-01 DIAGNOSIS — I10 Essential (primary) hypertension: Secondary | ICD-10-CM

## 2016-09-01 DIAGNOSIS — F411 Generalized anxiety disorder: Secondary | ICD-10-CM

## 2016-09-01 LAB — COMPREHENSIVE METABOLIC PANEL WITH GFR
ALT: 27 U/L (ref 0–35)
AST: 19 U/L (ref 0–37)
Albumin: 4.4 g/dL (ref 3.5–5.2)
Alkaline Phosphatase: 59 U/L (ref 39–117)
BUN: 12 mg/dL (ref 6–23)
CO2: 32 meq/L (ref 19–32)
Calcium: 9.6 mg/dL (ref 8.4–10.5)
Chloride: 102 meq/L (ref 96–112)
Creatinine, Ser: 0.74 mg/dL (ref 0.40–1.20)
GFR: 113.38 mL/min (ref >60.00)
Glucose, Bld: 88 mg/dL (ref 70–99)
Potassium: 3.3 meq/L — ABNORMAL LOW (ref 3.5–5.1)
Sodium: 141 meq/L (ref 135–145)
Total Bilirubin: 0.4 mg/dL (ref 0.2–1.2)
Total Protein: 7.2 g/dL (ref 6.0–8.3)

## 2016-09-01 LAB — LIPID PANEL
Cholesterol: 120 mg/dL (ref 0–200)
HDL: 40.5 mg/dL (ref >39.00)
LDL Cholesterol: 57 mg/dL (ref 0–99)
NonHDL: 79.71
Total CHOL/HDL Ratio: 3
Triglycerides: 112 mg/dL (ref 0.0–149.0)
VLDL: 22.4 mg/dL (ref 0.0–40.0)

## 2016-09-01 LAB — CBC
HEMATOCRIT: 38.5 % (ref 36.0–46.0)
Hemoglobin: 12.8 g/dL (ref 12.0–15.0)
MCHC: 33.3 g/dL (ref 30.0–36.0)
MCV: 78.1 fl (ref 78.0–100.0)
Platelets: 315 10*3/uL (ref 150.0–400.0)
RBC: 4.93 Mil/uL (ref 3.87–5.11)
RDW: 16.2 % — AB (ref 11.5–15.5)
WBC: 8.5 10*3/uL (ref 4.0–10.5)

## 2016-09-01 LAB — VITAMIN B12: Vitamin B-12: 1039 pg/mL — ABNORMAL HIGH (ref 211–911)

## 2016-09-01 LAB — HEMOGLOBIN A1C: Hgb A1c MFr Bld: 6 % (ref 4.6–6.5)

## 2016-09-01 MED ORDER — BUSPIRONE HCL 5 MG PO TABS: 5.0000 mg | ORAL_TABLET | Freq: Every day | ORAL | 3 refills | Status: DC

## 2016-09-01 NOTE — Assessment & Plan Note (Signed)
Ongoing anxiety issues Back on Buspar, with prn Xanax Support offered today Buspar refilled today

## 2016-09-01 NOTE — Progress Notes (Signed)
Subjective:    Patient ID: Debbie Walter, female    DOB: 01-11-1981, 35 y.o.   MRN: [id number]  HPI  Pt presents to the clinic today for her annual exam. She is also due for follow up of chronic conditions.  GAD: She recently stopped the Lexapro and went back to the Buspar. She reports the Buspar seems to be working better this time than it did before. Before, it made her feel like she was moving even though she was sitting still. She reports the Lexapro, made her feel like she was out of her head. She takes Xanax a couple of times a month.  GERD: She takes Protonix as prescribed with good relief. She has taken Maalox for breakthrough with good relief.  Hyperparathyroidism: She follows with Dr. Cruzita Lederer. Note from 12/2015 reviewed. Calcium and PTH from 2016 reviewed.  HTN: She takes Valsartan-HCTZ and Metoprolol as prescribed. She is also taking a potassium supplement. Her BP today is 126/80. ECG from 01/2016 reviewed.  PCOS: She follows with Dr. Cruzita Lederer. Last A1C was 6.3%, 12/2015. She is taking Metformin 500 mg BID.  Flu: 19/2017 Tetanus: 02/2014 Pap Smear: 06/2014 Dentist: biannually  Diet: She does eat meat. She does consumes fruits a few times per week. She does not really like vegetables. She does consume fried foods. She drinks mostly water. Exercise: She is not exercising.  Review of Systems      Past Medical History:  Diagnosis Date  . Fibroids   . GERD (gastroesophageal reflux disease)    takes protonix  . Hypertension   . Kidney stones 5/14  . Panic attacks   . PCOS (polycystic ovarian syndrome)    takes metformin for this    Current Outpatient Prescriptions  Medication Sig Dispense Refill  . ALPRAZolam (XANAX) 0.25 MG tablet TAKE 1 TABLET EVERY DAY AS NEEDED 30 tablet 0  . busPIRone (BUSPAR) 5 MG tablet TAKE 1 TABLET BY MOUTH EVERY DAY 30 tablet 0  . cholecalciferol (VITAMIN D) 1000 UNITS tablet Take 1,000 Units by mouth every other day.    . cyanocobalamin  500 MCG tablet Take 1 tablet (500 mcg total) by mouth daily. (Patient not taking: Reported on 08/23/2016)    . gentamicin (GARAMYCIN) 0.3 % ophthalmic solution Place 1-2 drops into both eyes 4 (four) times daily. In affected eye(s) 5 mL 0  . metFORMIN (GLUCOPHAGE) 500 MG tablet Take 1 tablet (500 mg total) by mouth 2 (two) times daily with a meal. 180 tablet 3  . metoprolol (LOPRESSOR) 50 MG tablet TAKE 1 TABLET BY MOUTH TWICE A DAY 180 tablet 1  . nitroGLYCERIN (NITROSTAT) 0.4 MG SL tablet DISSOLVE 1 TABLET UNDER THE TONUGE EVERY MINUTE AS NEEDED FOR CHEST PAIN  1  . pantoprazole (PROTONIX) 40 MG tablet TAKE 1 TABLET BY MOUTH EVERY DAY 90 tablet 0  . potassium chloride (KLOR-CON 10) 10 MEQ tablet Take 10 mEq by mouth 2 (two) times daily.    . Prenat w/o A-FeCbGl-DSS-FA-DHA (CITRANATAL ASSURE) 35-1 & 300 MG tablet Take 1 tablet by mouth daily.  11  . valACYclovir (VALTREX) 1000 MG tablet Take 2 tablets (2,000 mg total) by mouth 2 (two) times daily. For one day for a cold sore 4 tablet 3  . valsartan-hydrochlorothiazide (DIOVAN-HCT) 160-12.5 MG tablet Take 1 tablet by mouth every morning.  1   No current facility-administered medications for this visit.     Allergies  Allergen Reactions  . Procardia [Nifedipine] Other (See Comments)    headache  .  Wellbutrin [Bupropion] Other (See Comments)    "Makes my blood pressure go up - I don't ever want to take it again"  . Labetalol Other (See Comments)    "makes me feel like things are crawling through my head"  . Zithromax [Azithromycin] Other (See Comments)    Gi upset.  Diarrhea    Family History  Problem Relation Age of Onset  . Hyperlipidemia Mother   . Heart disease Mother   . Diabetes Mother   . Hypertension Mother   . Hyperlipidemia Father   . Hypertension Father   . Cancer Father     rectal  . Hypertension Sister   . Anesthesia problems Neg Hx   . Hypotension Neg Hx   . Malignant hyperthermia Neg Hx   . Pseudochol deficiency  Neg Hx     Social History   Social History  . Marital status: Married    Spouse name: N/A  . Number of children: N/A  . Years of education: N/A   Occupational History  . Not on file.   Social History Main Topics  . Smoking status: Never Smoker  . Smokeless tobacco: Never Used  . Alcohol use No  . Drug use: No  . Sexual activity: Yes    Birth control/ protection: None   Other Topics Concern  . Not on file   Social History Narrative  . No narrative on file     Constitutional: Denies fever, malaise, fatigue, headache or abnormal weight gain.  HEENT: Pt reports runny nose. Denies eye pain, eye redness, ear pain, ringing in the ears, wax buildup, nasal congestion, bloody nose, or sore throat. Respiratory: Pt reports cough. Denies difficulty breathing, shortness of breath, or sputum production.   Cardiovascular: Denies chest pain, chest tightness, palpitations or swelling in the hands or feet.  Gastrointestinal: Pt reports occasional constipation. Denies abdominal pain, bloating, diarrhea or blood in the stool.  GU: Denies urgency, frequency, pain with urination, burning sensation, blood in urine, odor or discharge. Musculoskeletal: Denies decrease in range of motion, difficulty with gait, muscle pain or joint pain and swelling.  Skin: Denies redness, rashes, lesions or ulcercations.  Neurological: Denies dizziness, difficulty with memory, difficulty with speech or problems with balance and coordination.  Psych: Pt reports anxiety. Denies depression, SI/HI.  No other specific complaints in a complete review of systems (except as listed in HPI above).  Objective:   Physical Exam   BP 126/80   Pulse 76   Temp 98.6 F (37 C) (Oral)   Ht 5' 1.5" (1.562 m)   Wt 205 lb (93 kg)   LMP 08/21/2016 (Approximate)   SpO2 97%   BMI 38.11 kg/m   Wt Readings from Last 3 Encounters:  08/23/16 205 lb 6.4 oz (93.2 kg)  06/15/16 199 lb 4 oz (90.4 kg)  05/11/16 196 lb (88.9 kg)     General: Appears her stated age, obese in NAD. Skin: Warm, dry and intact.  HEENT: Head: normal shape and size, no sinus tenderness noted; Eyes: sclera white, no icterus, conjunctiva pink, PERRLA and EOMs intact; Ears: Tm's gray and intact, normal light reflex, + serous effusion bilaterally; Throat/Mouth: Teeth present, mucosa boggy and moist, no exudate, lesions or ulcerations noted.  Neck:  Neck supple, trachea midline. No masses, lumps or thyromegaly present.  Cardiovascular: Normal rate and rhythm. S1,S2 noted.  No murmur, rubs or gallops noted. No JVD or BLE edema. No carotid bruits noted. Pulmonary/Chest: Normal effort and positive vesicular breath sounds. No respiratory  distress. No wheezes, rales or ronchi noted.  Abdomen: Soft and nontender. Normal bowel sounds. No distention or masses noted. Liver, spleen and kidneys non palpable due to size. Musculoskeletal: Strength 5/5 BUE/BLE. No signs of joint swelling. No difficulty with gait.  Neurological: Alert and oriented. Cranial nerves II-XII grossly intact. Coordination normal.  Psychiatric: She is mildly anxious today. Behavior and thought content is normal.     BMET    Component Value Date/Time   NA 141 01/18/2016 1217   K 4.2 03/15/2016 1757   CL 103 01/18/2016 1217   CO2 29 01/18/2016 1217   GLUCOSE 82 01/18/2016 1217   BUN 11 01/18/2016 1217   CREATININE 0.84 01/18/2016 1217   CREATININE 0.76 02/26/2015 1654   CALCIUM 10.3 01/18/2016 1217   GFRNONAA >90 04/29/2014 0513   GFRAA >90 04/29/2014 0513    Lipid Panel     Component Value Date/Time   CHOL 154 08/20/2015 1425   TRIG 157.0 (H) 08/20/2015 1425   HDL 35.20 (L) 08/20/2015 1425   CHOLHDL 4 08/20/2015 1425   VLDL 31.4 08/20/2015 1425   LDLCALC 88 08/20/2015 1425    CBC    Component Value Date/Time   WBC 9.2 03/15/2016 1757   RBC 4.82 03/15/2016 1757   HGB 12.6 03/15/2016 1757   HCT 38.1 03/15/2016 1757   PLT 323.0 03/15/2016 1757   MCV 79.1  03/15/2016 1757   MCH 27.3 05/16/2014 0548   MCHC 33.0 03/15/2016 1757   RDW 15.8 (H) 03/15/2016 1757   LYMPHSABS 2.0 08/20/2015 1425   MONOABS 0.4 08/20/2015 1425   EOSABS 0.2 08/20/2015 1425   BASOSABS 0.0 08/20/2015 1425    Hgb A1C Lab Results  Component Value Date   HGBA1C 6.3 01/14/2016        Assessment & Plan:   Preventative Health Maintenance:  Flu and tetanus UTD Pap smear due 2018 Encouraged her to consume a balanced diet and exercise regimen Advised her to see a dentist annually Will check CBC, CMET, Lipid, B12 and A1C today  RTC in 1 year for annual exam, follow up chronic conditions. Webb Silversmith, NP

## 2016-09-01 NOTE — Assessment & Plan Note (Signed)
Controlled on Protonix She is not interested in weaning this at this time CMET today

## 2016-09-01 NOTE — Assessment & Plan Note (Signed)
Calcium stable She will continue to follow with Dr. Cruzita Lederer

## 2016-09-01 NOTE — Assessment & Plan Note (Signed)
Controlled on Valsartan HCTZ and Metoprolol CMET today Continue potassium supplement for now

## 2016-09-01 NOTE — Patient Instructions (Signed)

## 2016-09-01 NOTE — Assessment & Plan Note (Signed)
She follows with Dr. Cruzita Lederer Continue Metformin for now

## 2016-09-05 ENCOUNTER — Other Ambulatory Visit: Payer: Self-pay | Admitting: Internal Medicine

## 2016-09-05 MED ORDER — POTASSIUM CHLORIDE ER 20 MEQ PO TBCR: 1.0000 | EXTENDED_RELEASE_TABLET | Freq: Two times a day (BID) | ORAL | 2 refills | Status: DC

## 2016-09-05 NOTE — Addendum Note (Signed)
Addended by: Lindalou Hose Y on: 09/05/2016 12:00 PM   Modules accepted: Orders

## 2016-09-06 DIAGNOSIS — L0889 Other specified local infections of the skin and subcutaneous tissue: Secondary | ICD-10-CM | POA: Diagnosis not present

## 2016-09-06 DIAGNOSIS — L304 Erythema intertrigo: Secondary | ICD-10-CM | POA: Diagnosis not present

## 2016-09-06 DIAGNOSIS — L2089 Other atopic dermatitis: Secondary | ICD-10-CM | POA: Diagnosis not present

## 2016-09-17 ENCOUNTER — Encounter (HOSPITAL_COMMUNITY): Payer: Self-pay | Admitting: Emergency Medicine

## 2016-09-17 ENCOUNTER — Ambulatory Visit (HOSPITAL_COMMUNITY)
Admission: EM | Admit: 2016-09-17 | Discharge: 2016-09-17 | Disposition: A | Discharge status: Home / Self Care | Payer: Federal, State, Local not specified - PPO | Attending: Emergency Medicine | Admitting: Emergency Medicine

## 2016-09-17 DIAGNOSIS — J069 Acute upper respiratory infection, unspecified: Secondary | ICD-10-CM | POA: Diagnosis not present

## 2016-09-17 DIAGNOSIS — J019 Acute sinusitis, unspecified: Secondary | ICD-10-CM

## 2016-09-17 MED ORDER — IPRATROPIUM BROMIDE 0.06 % NA SOLN: 2.0000 | Freq: Four times a day (QID) | NASAL | 0 refills | Status: DC

## 2016-09-17 NOTE — ED Triage Notes (Signed)
Noticed blood streaks in what she is blowing from nose and in phlegm she is coughing from her throat.  Particularly congested at night.  Facial pain and pressure

## 2016-09-17 NOTE — Discharge Instructions (Signed)
Use plenty of saline nasal spray frequently. Humidifier as needed. Also may use the prescription Atrovent nasal spray for drainage of the nose. Recommend taking Allegra 60 mg twice a day for drainage in the back of the throat and nose as well. Drink plenty fluids and stay well-hydrated.

## 2016-09-17 NOTE — ED Provider Notes (Signed)
CSN: [id number]     Arrival date & time 09/17/16  1211 History   First MD Initiated Contact with Patient 09/17/16 1418     Chief Complaint  Patient presents with  . URI   (Consider location/radiation/quality/duration/timing/severity/associated sxs/prior Treatment) 35 year old female complaining of upper respiratory congestion, paranasal congestion and pressure, runny nose and occasionally specks of blood when blowing her nose. Denies fever or chills. She has taken no medications except for Claritin. She states that she cannot take decongestants due to side effects and her blood pressure.      Past Medical History:  Diagnosis Date  . Fibroids   . GERD (gastroesophageal reflux disease)    takes protonix  . Hypertension   . Kidney stones 5/14  . Panic attacks   . PCOS (polycystic ovarian syndrome)    takes metformin for this   Past Surgical History:  Procedure Laterality Date  . CESAREAN SECTION    . CESAREAN SECTION WITH BILATERAL TUBAL LIGATION Bilateral 05/15/2014   Procedure: CESAREAN SECTION WITH BILATERAL TUBAL LIGATION;  Surgeon: Cyril Mourning, MD;  Location: [facility];  Service: Obstetrics;  Laterality: Bilateral;  repeat  edc 06/10/14  . HYSTEROSCOPY    . PARATHYROIDECTOMY    . PARATHYROIDECTOMY     Family History  Problem Relation Age of Onset  . Hyperlipidemia Mother   . Heart disease Mother   . Diabetes Mother   . Hypertension Mother   . Hyperlipidemia Father   . Hypertension Father   . Cancer Father     rectal  . Hypertension Sister   . Anesthesia problems Neg Hx   . Hypotension Neg Hx   . Malignant hyperthermia Neg Hx   . Pseudochol deficiency Neg Hx    Social History  Substance Use Topics  . Smoking status: Never Smoker  . Smokeless tobacco: Never Used  . Alcohol use No   OB History    Gravida Para Term Preterm AB Living   6 2 1 1 4 2    SAB TAB Ectopic Multiple Live Births   3   1   2      Review of Systems  Constitutional: Negative for  activity change, appetite change, chills, fatigue and fever.  HENT: Positive for congestion, postnasal drip, rhinorrhea and sinus pressure. Negative for ear pain, facial swelling and sore throat.   Eyes: Negative.   Respiratory: Positive for cough. Negative for shortness of breath.   Cardiovascular: Negative.   Musculoskeletal: Negative for neck pain and neck stiffness.       As per HPI  Skin: Negative for color change, pallor and rash.  Neurological: Negative.   All other systems reviewed and are negative.   Allergies  Procardia [nifedipine]; Wellbutrin [bupropion]; Labetalol; and Zithromax [azithromycin]  Home Medications   Prior to Admission medications   Medication Sig Start Date End Date Taking? Authorizing Provider  ALPRAZolam Duanne Moron) 0.25 MG tablet TAKE 1 TABLET EVERY DAY AS NEEDED 07/04/16   [name], NP  busPIRone (BUSPAR) 5 MG tablet Take 1 tablet (5 mg total) by mouth daily. 09/01/16   [name], NP  cholecalciferol (VITAMIN D) 1000 UNITS tablet Take 1,000 Units by mouth every other day.    Historical Provider, MD  cyanocobalamin 500 MCG tablet Take 1 tablet (500 mcg total) by mouth daily. 03/23/16   [name], NP  ipratropium (ATROVENT) 0.06 % nasal spray Place 2 sprays into both nostrils 4 (four) times daily. 09/17/16   Janne Napoleon, NP  metFORMIN (  GLUCOPHAGE) 500 MG tablet Take 1 tablet (500 mg total) by mouth 2 (two) times daily with a meal. 01/14/15   Philemon Kingdom, MD  metoprolol (LOPRESSOR) 50 MG tablet TAKE 1 TABLET BY MOUTH TWICE A DAY 03/27/16   [name], NP  nitroGLYCERIN (NITROSTAT) 0.4 MG SL tablet DISSOLVE 1 TABLET UNDER THE TONUGE EVERY MINUTE AS NEEDED FOR CHEST PAIN 02/29/16   Historical Provider, MD  pantoprazole (PROTONIX) 40 MG tablet TAKE 1 TABLET BY MOUTH EVERY DAY 07/07/16   [name], NP  Potassium Chloride ER 20 MEQ TBCR Take 1 tablet by mouth 2 (two) times daily. 09/05/16   [name], NP  Prenat w/o A-FeCbGl-DSS-FA-DHA  (CITRANATAL ASSURE) 35-1 & 300 MG tablet Take 1 tablet by mouth daily. 01/28/15   Historical Provider, MD  valACYclovir (VALTREX) 1000 MG tablet Take 2 tablets (2,000 mg total) by mouth 2 (two) times daily. For one day for a cold sore Patient not taking: Reported on 09/01/2016 08/23/16   Abner Greenspan, MD  valsartan-hydrochlorothiazide (DIOVAN-HCT) 160-12.5 MG tablet Take 1 tablet by mouth every morning. 02/11/16   Historical Provider, MD   Meds Ordered and Administered this Visit  Medications - No data to display  BP 142/91 (BP Location: Left Arm) Comment (BP Location): large cuff  Pulse 78   Temp 98.6 F (37 C) (Oral)   Resp 20   LMP 08/21/2016 (Approximate)   SpO2 100%  No data found.   Physical Exam  Constitutional: She is oriented to person, place, and time. She appears well-developed and well-nourished. No distress.  HENT:  Mouth/Throat: No oropharyngeal exudate.  Bilateral TMs are normal. Oropharynx with clear PND. Otherwise clear.  Eyes: EOM are normal.  Neck: Normal range of motion. Neck supple.  Cardiovascular: Normal rate and regular rhythm.   Pulmonary/Chest: Effort normal and breath sounds normal. No respiratory distress.  Musculoskeletal: Normal range of motion. She exhibits no edema.  Lymphadenopathy:    She has no cervical adenopathy.  Neurological: She is alert and oriented to person, place, and time.  Skin: Skin is warm and dry. No rash noted.  Psychiatric: She has a normal mood and affect.  Nursing note and vitals reviewed.   Urgent Care Course   Clinical Course     Procedures (including critical care time)  Labs Review Labs Reviewed - No data to display  Imaging Review No results found.   Visual Acuity Review  Right Eye Distance:   Left Eye Distance:   Bilateral Distance:    Right Eye Near:   Left Eye Near:    Bilateral Near:         MDM   1. Acute upper respiratory infection   2. Acute rhinosinusitis    Use plenty of saline nasal  spray frequently. Humidifier as needed. Also may use the prescription Atrovent nasal spray for drainage of the nose. Recommend taking Allegra 60 mg twice a day for drainage in the back of the throat and nose as well. Drink plenty fluids and stay well-hydrated. Meds ordered this encounter  Medications  . ipratropium (ATROVENT) 0.06 % nasal spray    Sig: Place 2 sprays into both nostrils 4 (four) times daily.    Dispense:  15 mL    Refill:  0    Order Specific Question:   Supervising Provider    Answer:   Melony Overly [id number]       Janne Napoleon, NP 09/17/16 1437

## 2016-09-19 ENCOUNTER — Telehealth: Payer: Self-pay | Admitting: Internal Medicine

## 2016-09-19 DIAGNOSIS — H40013 Open angle with borderline findings, low risk, bilateral: Secondary | ICD-10-CM | POA: Diagnosis not present

## 2016-09-19 DIAGNOSIS — H10413 Chronic giant papillary conjunctivitis, bilateral: Secondary | ICD-10-CM | POA: Diagnosis not present

## 2016-09-19 DIAGNOSIS — H40053 Ocular hypertension, bilateral: Secondary | ICD-10-CM | POA: Diagnosis not present

## 2016-09-19 DIAGNOSIS — H04123 Dry eye syndrome of bilateral lacrimal glands: Secondary | ICD-10-CM | POA: Diagnosis not present

## 2016-09-19 NOTE — Telephone Encounter (Signed)
Did she make an appt to be seen?

## 2016-09-19 NOTE — Telephone Encounter (Signed)
Pt went to UC as advised on 09/17/16--day of call, is shown in chart

## 2016-09-19 NOTE — Telephone Encounter (Signed)
Patient Name: Debbie Walter Gender: Female DOB: 02/13/1981 Age: 36 Y 54 M 6 D Return Phone Number: [phone] (Secondary) Address: City/State/Zip: [place] Client [place] - Client Client Site [place] Physician Webb Silversmith - NP Contact Type Call Who Is Calling Patient / Member / Family / Caregiver Call Type Triage / Clinical Relationship To Patient Self Return Phone Number [phone] (Secondary) Chief Complaint Nasal Congestion Reason for Call Symptomatic / Request for Health Information Initial Comment Caller states blowing blood from nose, thinks she has sinus infection, seeing yellow mucus as well, pressure around eyes and front of head, feels a little off balance GOTO Facility Not Listed [facility] PreDisposition Go to Urgent Care/Walk-In Clinic Translation No Nurse Assessment Nurse: Thad Ranger, RN, Denise Date/Time (Eastern Time): 09/17/2016 8:26:41 AM Confirm and document reason for call. If symptomatic, describe symptoms. ---Caller states she thinks she has a sinus infection, has yellow, bloody mucus discharge from her nose, pressure around her cheeks, eyes and feels slightly unbalanced. Does the patient have any new or worsening symptoms? ---Yes Will a triage be completed? ---Yes Related visit to physician within the last 2 weeks? ---No Does the PT have any chronic conditions? (i.e. diabetes, asthma, etc.) ---Yes List chronic conditions. ---HTN Is the patient pregnant or possibly pregnant? (Ask all females between the ages of 26-55) ---No Is this a behavioral health or substance abuse call? ---No Guidelines Guideline Title Affirmed Question Affirmed Notes Nurse Date/Time Eilene Ghazi Time) Sinus Pain or Congestion [1] Sinus congestion as part of a cold AND [2] present < 10 days (all triage questions negative) Carmon, RN, Langley Gauss 09/17/2016 8:29:06 AM Disp. Time Eilene Ghazi Time) Disposition Final  User PLEASE NOTE: All timestamps contained within this report are represented as [place] Standard Time. CONFIDENTIALTY NOTICE: This fax transmission is intended only for the addressee. It contains information that is legally privileged, confidential or otherwise protected from use or disclosure. If you are not the intended recipient, you are strictly prohibited from reviewing, disclosing, copying using or disseminating any of this information or taking any action in reliance on or regarding this information. If you have received this fax in error, please notify us immediately by telephone so that we can arrange for its return to [place]. Phone: [phone], Toll-Free: [phone], Fax: [phone] Page: 2 of 2 Call Id: [id number] 09/17/2016 8:34:48 AM See Physician within 24 Hours Yes Carmon, RN, Langley Gauss Disposition Overriden: Home Care Override Reason: Patients symptoms need a higher level of care Caller Understands: Yes Disagree/Comply: Comply Care Advice Given Per Guideline SEE PHYSICIAN WITHIN 24 HOURS: FOR A STUFFY NOSE - USE NASAL WASHES: * Introduction: Saline (salt water) nasal irrigation (nasal wash) is an effective and simple home remedy for treating stuffy nose and sinus congestion. The nose can be irrigated by pouring, spraying, or squirting salt water into the nose and then letting it run back out. * STEP 4: Blow your nose to clean out the water and mucus. PAIN OR FEVER MEDICINES: IBUPROFEN (E.G., MOTRIN, ADVIL): * Take 400 mg (two 200 mg pills) by mouth every 6 hours as needed. * Another choice is to take 600 mg (three 200 mg pills) by mouth every 8 hours as needed. CALL BACK IF: * Difficulty breathing (and not relieved by cleaning out nose) * You become worse. CARE ADVICE given per Sinus Pain or Congestion (Adult) guideline.

## 2016-09-21 ENCOUNTER — Ambulatory Visit (INDEPENDENT_AMBULATORY_CARE_PROVIDER_SITE_OTHER): Payer: Federal, State, Local not specified - PPO | Admitting: Primary Care

## 2016-09-21 ENCOUNTER — Encounter: Payer: Self-pay | Admitting: Primary Care

## 2016-09-21 VITALS — BP 132/80 | HR 84 | Temp 98.4°F | Ht 61.5 in | Wt 212.1 lb

## 2016-09-21 DIAGNOSIS — J019 Acute sinusitis, unspecified: Secondary | ICD-10-CM | POA: Diagnosis not present

## 2016-09-21 MED ORDER — AMOXICILLIN-POT CLAVULANATE 875-125 MG PO TABS: 1.0000 | ORAL_TABLET | Freq: Two times a day (BID) | ORAL | 0 refills | Status: DC

## 2016-09-21 NOTE — Progress Notes (Signed)
Subjective:    Patient ID: Debbie Walter, female    DOB: 01/27/1981, 36 y.o.   MRN: [id number]  HPI  Debbie Walter is a 36 year old female who presents today for Urgent Care Follow Up.  She presented to [facility] on 09/17/16 with a chief complaint of chest congestion, nasal congestion, rhinorrhea, small amount of blood when blowing her nose, sinus pressure. She was evaluated and diagnosed with a viral URI with acute viral rhinosinusitis. She was discharged home and was provided with a prescription for ipratropium, encouraged to take Allegra and use a humidifier.  Since her Urgent Care visit she's noticed no improvement in her symptoms and is feeling worse. Her symptoms have now been present for the past 2.5 weeks and mostly include sinus pressure and nasal congestion. She denies cough, fevers, nausea.  Review of Systems  Constitutional: Positive for fatigue. Negative for chills and fever.  HENT: Positive for congestion, sinus pain and sinus pressure. Negative for ear pain and sore throat.   Respiratory: Negative for cough and shortness of breath.   Neurological: Positive for headaches.       Past Medical History:  Diagnosis Date  . Fibroids   . GERD (gastroesophageal reflux disease)    takes protonix  . Hypertension   . Kidney stones 5/14  . Panic attacks   . PCOS (polycystic ovarian syndrome)    takes metformin for this     Social History   Social History  . Marital status: Married    Spouse name: N/A  . Number of children: N/A  . Years of education: N/A   Occupational History  . Not on file.   Social History Main Topics  . Smoking status: Never Smoker  . Smokeless tobacco: Never Used  . Alcohol use No  . Drug use: No  . Sexual activity: Yes    Birth control/ protection: None   Other Topics Concern  . Not on file   Social History Narrative  . No narrative on file    Past Surgical History:  Procedure Laterality Date  . CESAREAN SECTION    . CESAREAN  SECTION WITH BILATERAL TUBAL LIGATION Bilateral 05/15/2014   Procedure: CESAREAN SECTION WITH BILATERAL TUBAL LIGATION;  Surgeon: Debbie Mourning, MD;  Location: [facility];  Service: Obstetrics;  Laterality: Bilateral;  repeat  edc 06/10/14  . HYSTEROSCOPY    . PARATHYROIDECTOMY    . PARATHYROIDECTOMY      Family History  Problem Relation Age of Onset  . Hyperlipidemia Mother   . Heart disease Mother   . Diabetes Mother   . Hypertension Mother   . Hyperlipidemia Father   . Hypertension Father   . Cancer Father     rectal  . Hypertension Sister   . Anesthesia problems Neg Hx   . Hypotension Neg Hx   . Malignant hyperthermia Neg Hx   . Pseudochol deficiency Neg Hx     Allergies  Allergen Reactions  . Procardia [Nifedipine] Other (See Comments)    headache  . Wellbutrin [Bupropion] Other (See Comments)    "Makes my blood pressure go up - I don't ever want to take it again"  . Labetalol Other (See Comments)    "makes me feel like things are crawling through my head"  . Zithromax [Azithromycin] Other (See Comments)    Gi upset.  Diarrhea    Current Outpatient Prescriptions on File Prior to Visit  Medication Sig Dispense Refill  . ALPRAZolam (XANAX) 0.25  MG tablet TAKE 1 TABLET EVERY DAY AS NEEDED 30 tablet 0  . busPIRone (BUSPAR) 5 MG tablet Take 1 tablet (5 mg total) by mouth daily. 90 tablet 3  . cholecalciferol (VITAMIN D) 1000 UNITS tablet Take 1,000 Units by mouth every other day.    . cyanocobalamin 500 MCG tablet Take 1 tablet (500 mcg total) by mouth daily.    . metFORMIN (GLUCOPHAGE) 500 MG tablet Take 1 tablet (500 mg total) by mouth 2 (two) times daily with a meal. 180 tablet 3  . metoprolol (LOPRESSOR) 50 MG tablet TAKE 1 TABLET BY MOUTH TWICE A DAY 180 tablet 1  . pantoprazole (PROTONIX) 40 MG tablet TAKE 1 TABLET BY MOUTH EVERY DAY 90 tablet 0  . Potassium Chloride ER 20 MEQ TBCR Take 1 tablet by mouth 2 (two) times daily. 60 tablet 2  . Prenat w/o  A-FeCbGl-DSS-FA-DHA (CITRANATAL ASSURE) 35-1 & 300 MG tablet Take 1 tablet by mouth daily.  11  . valACYclovir (VALTREX) 1000 MG tablet Take 2 tablets (2,000 mg total) by mouth 2 (two) times daily. For one day for a cold sore 4 tablet 3  . valsartan-hydrochlorothiazide (DIOVAN-HCT) 160-12.5 MG tablet Take 1 tablet by mouth every morning.  1  . nitroGLYCERIN (NITROSTAT) 0.4 MG SL tablet DISSOLVE 1 TABLET UNDER THE TONUGE EVERY MINUTE AS NEEDED FOR CHEST PAIN  1   No current facility-administered medications on file prior to visit.     BP 132/80   Pulse 84   Temp 98.4 F (36.9 C) (Oral)   Ht 5' 1.5" (1.562 m)   Wt 212 lb 1.9 oz (96.2 kg)   LMP 08/21/2016 (Approximate)   SpO2 94%   BMI 39.43 kg/m    Objective:   Physical Exam  Constitutional: She appears well-nourished.  HENT:  Right Ear: Tympanic membrane and ear canal normal.  Left Ear: Tympanic membrane and ear canal normal.  Nose: Mucosal edema present. Right sinus exhibits maxillary sinus tenderness and frontal sinus tenderness. Left sinus exhibits maxillary sinus tenderness and frontal sinus tenderness.  Mouth/Throat: Posterior oropharyngeal erythema present.  Eyes: Conjunctivae are normal.  Neck: Neck supple.  Cardiovascular: Normal rate and regular rhythm.   Pulmonary/Chest: Effort normal and breath sounds normal. She has no wheezes. She has no rales.  Lymphadenopathy:    She has no cervical adenopathy.  Skin: Skin is warm and dry.          Assessment & Plan:  Urgent Care Follow Up:  Presented to Urgent Care on 09/17/16 with URI symptoms. Diagnosed with viral URI and treated with supportive measures. Since her visit she's feeling worse. Exam today consistent with acute bacterial sinusitis. Given duration of symptoms, presentation, and examination, will treat with Augmentin course. Rx for Augmentin sent to pharmacy. Fluids, rest, follow up PRN.  All Urgent care notes reviewed. Sheral Flow, NP

## 2016-09-21 NOTE — Progress Notes (Signed)
Pre visit review using our clinic review tool, if applicable. No additional management support is needed unless otherwise documented below in the visit note. 

## 2016-09-21 NOTE — Patient Instructions (Addendum)
Start Augmentin antibiotics. Take 1 tablet by mouth twice daily for 10 days.  Continue to use Vaseline to help moisturize your nasal cavity.  Ensure you are staying hydrated with water and rest.  It was a pleasure meeting you!    Sinusitis, Adult Sinusitis is soreness and inflammation of your sinuses. Sinuses are hollow spaces in the bones around your face. Your sinuses are located:  Around your eyes.  In the middle of your forehead.  Behind your nose.  In your cheekbones. Your sinuses and nasal passages are lined with a stringy fluid (mucus). Mucus normally drains out of your sinuses. When your nasal tissues become inflamed or swollen, the mucus can become trapped or blocked so air cannot flow through your sinuses. This allows bacteria, viruses, and funguses to grow, which leads to infection. Sinusitis can develop quickly and last for 7?10 days (acute) or for more than 12 weeks (chronic). Sinusitis often develops after a cold. What are the causes? This condition is caused by anything that creates swelling in the sinuses or stops mucus from draining, including:  Allergies.  Asthma.  Bacterial or viral infection.  Abnormally shaped bones between the nasal passages.  Nasal growths that contain mucus (nasal polyps).  Narrow sinus openings.  Pollutants, such as chemicals or irritants in the air.  A foreign object stuck in the nose.  A fungal infection. This is rare. What increases the risk? The following factors may make you more likely to develop this condition:  Having allergies or asthma.  Having had a recent cold or respiratory tract infection.  Having structural deformities or blockages in your nose or sinuses.  Having a weak immune system.  Doing a lot of swimming or diving.  Overusing nasal sprays.  Smoking. What are the signs or symptoms? The main symptoms of this condition are pain and a feeling of pressure around the affected sinuses. Other symptoms  include:  Upper toothache.  Earache.  Headache.  Bad breath.  Decreased sense of smell and taste.  A cough that may get worse at night.  Fatigue.  Fever.  Thick drainage from your nose. The drainage is often green and it may contain pus (purulent).  Stuffy nose or congestion.  Postnasal drip. This is when extra mucus collects in the throat or back of the nose.  Swelling and warmth over the affected sinuses.  Sore throat.  Sensitivity to light. How is this diagnosed? This condition is diagnosed based on symptoms, a medical history, and a physical exam. To find out if your condition is acute or chronic, your health care provider may:  Look in your nose for signs of nasal polyps.  Tap over the affected sinus to check for signs of infection.  View the inside of your sinuses using an imaging device that has a light attached (endoscope). If your health care provider suspects that you have chronic sinusitis, you may also:  Be tested for allergies.  Have a sample of mucus taken from your nose (nasal culture) and checked for bacteria.  Have a mucus sample examined to see if your sinusitis is related to an allergy. If your sinusitis does not respond to treatment and it lasts longer than 8 weeks, you may have an MRI or CT scan to check your sinuses. These scans also help to determine how severe your infection is. In rare cases, a bone biopsy may be done to rule out more serious types of fungal sinus disease. How is this treated? Treatment for sinusitis depends  on the cause and whether your condition is chronic or acute. If a virus is causing your sinusitis, your symptoms will go away on their own within 10 days. You may be given medicines to relieve your symptoms, including:  Topical nasal decongestants. They shrink swollen nasal passages and let mucus drain from your sinuses.  Antihistamines. These drugs block inflammation that is triggered by allergies. This can help to ease  swelling in your nose and sinuses.  Topical nasal corticosteroids. These are nasal sprays that ease inflammation and swelling in your nose and sinuses.  Nasal saline washes. These rinses can help to get rid of thick mucus in your nose. If your condition is caused by bacteria, you will be given an antibiotic medicine. If your condition is caused by a fungus, you will be given an antifungal medicine. Surgery may be needed to correct underlying conditions, such as narrow nasal passages. Surgery may also be needed to remove polyps. Follow these instructions at home: Medicines  Take, use, or apply over-the-counter and prescription medicines only as told by your health care provider. These may include nasal sprays.  If you were prescribed an antibiotic medicine, take it as told by your health care provider. Do not stop taking the antibiotic even if you start to feel better. Hydrate and Humidify  Drink enough water to keep your urine clear or pale yellow. Staying hydrated will help to thin your mucus.  Use a cool mist humidifier to keep the humidity level in your home above 50%.  Inhale steam for 10-15 minutes, 3-4 times a day or as told by your health care provider. You can do this in the bathroom while a hot shower is running.  Limit your exposure to cool or dry air. Rest  Rest as much as possible.  Sleep with your head raised (elevated).  Make sure to get enough sleep each night. General instructions  Apply a warm, moist washcloth to your face 3-4 times a day or as told by your health care provider. This will help with discomfort.  Wash your hands often with soap and water to reduce your exposure to viruses and other germs. If soap and water are not available, use hand sanitizer.  Do not smoke. Avoid being around people who are smoking (secondhand smoke).  Keep all follow-up visits as told by your health care provider. This is important. Contact a health care provider if:  You  have a fever.  Your symptoms get worse.  Your symptoms do not improve within 10 days. Get help right away if:  You have a severe headache.  You have persistent vomiting.  You have pain or swelling around your face or eyes.  You have vision problems.  You develop confusion.  Your neck is stiff.  You have trouble breathing. This information is not intended to replace advice given to you by your health care provider. Make sure you discuss any questions you have with your health care provider. Document Released: 09/04/2005 Document Revised: 04/30/2016 Document Reviewed: 06/30/2015 Elsevier Interactive Patient Education  2017 [employer].

## 2016-09-28 ENCOUNTER — Other Ambulatory Visit: Payer: Self-pay | Admitting: Internal Medicine

## 2016-10-05 ENCOUNTER — Other Ambulatory Visit: Payer: Self-pay | Admitting: Internal Medicine

## 2016-10-12 ENCOUNTER — Telehealth: Payer: Self-pay | Admitting: Internal Medicine

## 2016-10-12 DIAGNOSIS — Z9889 Other specified postprocedural states: Secondary | ICD-10-CM | POA: Diagnosis not present

## 2016-10-12 DIAGNOSIS — H04123 Dry eye syndrome of bilateral lacrimal glands: Secondary | ICD-10-CM | POA: Diagnosis not present

## 2016-10-12 DIAGNOSIS — H40013 Open angle with borderline findings, low risk, bilateral: Secondary | ICD-10-CM | POA: Diagnosis not present

## 2016-10-12 DIAGNOSIS — H10413 Chronic giant papillary conjunctivitis, bilateral: Secondary | ICD-10-CM | POA: Diagnosis not present

## 2016-10-12 NOTE — Telephone Encounter (Signed)
Christy at [facility] left v/m requesting cb about PA needed for quantity exception for pantoprazole.

## 2016-10-12 NOTE — Telephone Encounter (Signed)
Called pharmacy to get 3rd party info for PA called [phone] and completed PA via telephone... It was approved through 10/12/2017 and called pt Left detailed msg on VM per HIPAA

## 2016-10-12 NOTE — Telephone Encounter (Signed)
Pt is requesting a refill for platonix 40 mg/ once day. Needs prior authorization for meds. Out of meds. Call after 3pm [phone].

## 2016-11-14 ENCOUNTER — Telehealth: Payer: Self-pay | Admitting: Internal Medicine

## 2016-11-14 ENCOUNTER — Other Ambulatory Visit: Payer: Self-pay | Admitting: Internal Medicine

## 2016-11-14 DIAGNOSIS — E876 Hypokalemia: Secondary | ICD-10-CM

## 2016-11-14 DIAGNOSIS — T502X5A Adverse effect of carbonic-anhydrase inhibitors, benzothiadiazides and other diuretics, initial encounter: Principal | ICD-10-CM

## 2016-11-14 DIAGNOSIS — E538 Deficiency of other specified B group vitamins: Secondary | ICD-10-CM

## 2016-11-14 NOTE — Telephone Encounter (Signed)
Patient called about the lab work.  I let patient know the orders are in the computer and she's going to try and get the lab work done tomorrow at the [facility] office.

## 2016-11-14 NOTE — Telephone Encounter (Signed)
Pt would like to get her potassium and b12 checked.  She is tired and aching muscles.   She has increased her dose in the morning cb number is [phone], leave on machine if no answer. She would also like directions to [facility] location, and would like to go today.  She would like to go [facility] lab

## 2016-11-14 NOTE — Telephone Encounter (Signed)
Labs entered.

## 2016-11-15 ENCOUNTER — Other Ambulatory Visit (INDEPENDENT_AMBULATORY_CARE_PROVIDER_SITE_OTHER): Payer: Federal, State, Local not specified - PPO

## 2016-11-15 DIAGNOSIS — E876 Hypokalemia: Secondary | ICD-10-CM | POA: Diagnosis not present

## 2016-11-15 DIAGNOSIS — E538 Deficiency of other specified B group vitamins: Secondary | ICD-10-CM | POA: Diagnosis not present

## 2016-11-15 DIAGNOSIS — T502X5A Adverse effect of carbonic-anhydrase inhibitors, benzothiadiazides and other diuretics, initial encounter: Secondary | ICD-10-CM | POA: Diagnosis not present

## 2016-11-15 LAB — POTASSIUM: Potassium: 3.5 mEq/L (ref 3.5–5.1)

## 2016-11-15 LAB — VITAMIN B12: Vitamin B-12: 850 pg/mL (ref 211–911)

## 2016-11-19 ENCOUNTER — Other Ambulatory Visit: Payer: Self-pay | Admitting: Internal Medicine

## 2016-11-21 ENCOUNTER — Telehealth: Payer: Self-pay

## 2016-11-21 DIAGNOSIS — R899 Unspecified abnormal finding in specimens from other organs, systems and tissues: Secondary | ICD-10-CM

## 2016-11-21 NOTE — Telephone Encounter (Signed)
Pt left v/m;pt request to get K and magnesium or Cmet at [facility] lab. Pt thinks HCTZ and Diovan is causing pt to urinate more often (urinates at least 7 x before 11:00 AM) and depleting her K. pts legs hurt and ache. Pt has already increased K from 20 meq to 40 meq in AM as instructed. When pt takes K 40 meq and eats banana legs do not ache as bad.Pt request cb.

## 2016-11-22 ENCOUNTER — Telehealth: Payer: Self-pay | Admitting: Internal Medicine

## 2016-11-22 MED ORDER — POTASSIUM CHLORIDE ER 20 MEQ PO TBCR: EXTENDED_RELEASE_TABLET | ORAL | 2 refills | Status: DC

## 2016-11-22 NOTE — Telephone Encounter (Signed)
Patient Name: Debbie Walter  DOB: 1981/07/19    Initial Comment Caller says she had some blood work done last week. She has been having a lot of leg cramps. She has increased her potassium levels to help, but it has not. She was experiencing chest pain and heart was racing this morning. Is thinking that it may have been anxiety. Is wanting more blood work done if possible.    Nurse Assessment  Nurse: Raphael Gibney, RN, Vera Date/Time (Eastern Time): 11/22/2016 1:18:21 PM  Confirm and document reason for call. If symptomatic, describe symptoms. ---Caller states she had blood work done last week. Had potassium level done. Potassium 3.5 last week and she started taking 40 meq QD and 20 meq at night. She took potassium 40 meq BID yesterday. She is wanting another potassium level checked. Also would like her electrolytes and magnesium level checked. Having leg cramps. Had chest pain and elevated heart rate around 3 am. no chest pain or elevated heart now. Chest pain lasted for 30 min. No leg cramps right now. She is urinating.  Does the patient have any new or worsening symptoms? ---Yes  Will a triage be completed? ---Yes  Related visit to physician within the last 2 weeks? ---No  Does the PT have any chronic conditions? (i.e. diabetes, asthma, etc.) ---Yes  List chronic conditions. ---hypokalemia  Is the patient pregnant or possibly pregnant? (Ask all females between the ages of 84-55) ---No  Is this a behavioral health or substance abuse call? ---No     Guidelines    Guideline Title Affirmed Question Affirmed Notes  Chest Pain Chest pain lasts > 5 minutes (Exceptions: chest pain occurring > 3 days ago and now asymptomatic; same as previously diagnosed heartburn and has accompanying sour taste in mouth)    Final Disposition User   Go to ED Now (or PCP triage) Raphael Gibney, RN, Vera    Comments  Caller says she will be at lunch until 115 and would like a call back by then or after 5.  pt states she is at  work and can not be seen anywhere today. She does not get off until 5 pm and would like call back regarding getting her electrolytes done at [facility] tomorrow during her lunch.  Called back line and spoke to [place] and gave report that pt had potassium level done last week and it was 3.5 She has increased her potassium. Around 3 am, she had chest pain for 30 min with elevated heart rate. No chest pain or elevated heart rate now. She is at work and can not come to the office or go anywhere to be seen. She is requesting to have her electrolytes drawn to [facility] tomorrow during lunch. She is at work and can not receive calls until after 5 pm.   Referrals  GO TO FACILITY REFUSED   Disagree/Comply: Comply

## 2016-11-22 NOTE — Telephone Encounter (Signed)
Potassium level was just checked 1 week ago. She does not need additional labs at this time. Continue potasium as is for now.

## 2016-11-22 NOTE — Telephone Encounter (Signed)
I am not rechecking her levels tomorrow, they were normal last week.

## 2016-11-22 NOTE — Telephone Encounter (Signed)
Pt wants to know if she can have labs rechecked next week at [facility] office? Also pt reports that she will hold off on taking her statin x 1 week to see if there is a difference with leg cramping--- pt denies having cramps in legs today---please advise

## 2016-11-22 NOTE — Addendum Note (Signed)
Addended by: Lurlean Nanny on: 11/22/2016 04:35 PM   Modules accepted: Orders

## 2016-11-23 NOTE — Telephone Encounter (Signed)
Yes we can recheck in 1 week. Please order future potassium and magnesium.a

## 2016-11-24 NOTE — Addendum Note (Signed)
Addended by: Lurlean Nanny on: 11/24/2016 05:19 PM   Modules accepted: Orders

## 2016-11-24 NOTE — Telephone Encounter (Signed)
Pt is aware as instructed---labs future ordered

## 2016-11-29 ENCOUNTER — Other Ambulatory Visit (INDEPENDENT_AMBULATORY_CARE_PROVIDER_SITE_OTHER): Payer: Federal, State, Local not specified - PPO

## 2016-11-29 DIAGNOSIS — R899 Unspecified abnormal finding in specimens from other organs, systems and tissues: Secondary | ICD-10-CM | POA: Diagnosis not present

## 2016-11-29 LAB — MAGNESIUM: Magnesium: 1.8 mg/dL (ref 1.5–2.5)

## 2016-11-29 LAB — POTASSIUM: POTASSIUM: 3.7 meq/L (ref 3.5–5.1)

## 2016-12-13 DIAGNOSIS — R110 Nausea: Secondary | ICD-10-CM | POA: Diagnosis not present

## 2016-12-25 ENCOUNTER — Other Ambulatory Visit: Payer: Self-pay | Admitting: Internal Medicine

## 2016-12-26 NOTE — Telephone Encounter (Signed)
Ok to phone in Xanax 

## 2016-12-26 NOTE — Telephone Encounter (Signed)
Last filled 06/2016--please advise

## 2016-12-26 NOTE — Telephone Encounter (Signed)
Rx called in to requested pharmacy 

## 2016-12-27 ENCOUNTER — Ambulatory Visit (INDEPENDENT_AMBULATORY_CARE_PROVIDER_SITE_OTHER): Payer: Federal, State, Local not specified - PPO | Admitting: Family Medicine

## 2016-12-27 ENCOUNTER — Encounter: Payer: Self-pay | Admitting: Family Medicine

## 2016-12-27 VITALS — BP 122/78 | HR 72 | Temp 98.5°F | Ht 61.5 in | Wt 208.4 lb

## 2016-12-27 DIAGNOSIS — R600 Localized edema: Secondary | ICD-10-CM

## 2016-12-27 NOTE — Progress Notes (Signed)
Pre visit review using our clinic review tool, if applicable. No additional management support is needed unless otherwise documented below in the visit note. 

## 2016-12-27 NOTE — Progress Notes (Signed)
Subjective:    Patient ID: Debbie Walter, female    DOB: [birth year], 36 y.o.   MRN: [id number]  HPI 36 yo pt of [facility] here with ankle swelling (bilateral but worse on the L)  She has a sit down job  Had to decrease water intake to decrease bathroom breaks  Now she has increased it again  Not urinating nearly as much  Wonders if TED hose would help   Her father has had a DVT in the past   She has a hx of PCOS with obesity and hx of parathyroidectomy , also thyroid nodules  She sees Dr Cruzita Lederer for endocrinology  No hx of varicose veins   BP Readings from Last 3 Encounters:  12/27/16 122/78  09/21/16 132/80  09/17/16 142/91   Takes metoprolol for HTN as well as diovan hct and a K supplement   nsaids -none   Pulse Readings from Last 3 Encounters:  12/27/16 72  09/21/16 84  09/17/16 78   Wt Readings from Last 3 Encounters:  12/27/16 208 lb 6.4 oz (94.5 kg)  09/21/16 212 lb 1.9 oz (96.2 kg)  09/01/16 205 lb (93 kg)   bmi 38.7  Patient Active Problem List   Diagnosis Date Noted  . Pedal edema 12/27/2016  . Recurrent cold sores 08/23/2016  . Serum calcium elevated 01/17/2016  . Multiple thyroid nodules 01/17/2016  . Overactive thyroid gland 09/22/2015  . HTN (hypertension) 02/10/2015  . PCOS (polycystic ovarian syndrome) 02/10/2015  . Generalized anxiety disorder 02/10/2015  . GERD (gastroesophageal reflux disease) 02/10/2015  . Elevated urine levels of catecholamines 01/14/2015  . H/O hyperparathyroidism 01/14/2015   Past Medical History:  Diagnosis Date  . Fibroids   . GERD (gastroesophageal reflux disease)    takes protonix  . Hypertension   . Kidney stones 5/14  . Panic attacks   . PCOS (polycystic ovarian syndrome)    takes metformin for this   Past Surgical History:  Procedure Laterality Date  . CESAREAN SECTION    . CESAREAN SECTION WITH BILATERAL TUBAL LIGATION Bilateral 05/15/2014   Procedure: CESAREAN SECTION WITH BILATERAL TUBAL  LIGATION;  Surgeon: Cyril Mourning, MD;  Location: [facility];  Service: Obstetrics;  Laterality: Bilateral;  repeat  edc 06/10/14  . HYSTEROSCOPY    . PARATHYROIDECTOMY    . PARATHYROIDECTOMY     Social History  Substance Use Topics  . Smoking status: Never Smoker  . Smokeless tobacco: Never Used  . Alcohol use No   Family History  Problem Relation Age of Onset  . Hyperlipidemia Mother   . Heart disease Mother   . Diabetes Mother   . Hypertension Mother   . Hyperlipidemia Father   . Hypertension Father   . Cancer Father     rectal  . Hypertension Sister   . Anesthesia problems Neg Hx   . Hypotension Neg Hx   . Malignant hyperthermia Neg Hx   . Pseudochol deficiency Neg Hx    Allergies  Allergen Reactions  . Procardia [Nifedipine] Other (See Comments)    headache  . Wellbutrin [Bupropion] Other (See Comments)    "Makes my blood pressure go up - I don't ever want to take it again"  . Labetalol Other (See Comments)    "makes me feel like things are crawling through my head"  . Zithromax [Azithromycin] Other (See Comments)    Gi upset.  Diarrhea   Current Outpatient Prescriptions on File Prior to Visit  Medication Sig Dispense  Refill  . ALPRAZolam (XANAX) 0.25 MG tablet TAKE 1 TABLET BY MOUTH EVERY DAY AS NEEDED 30 tablet 0  . busPIRone (BUSPAR) 5 MG tablet Take 1 tablet (5 mg total) by mouth daily. 90 tablet 3  . cholecalciferol (VITAMIN D) 1000 UNITS tablet Take 1,000 Units by mouth every other day.    . cyanocobalamin 500 MCG tablet Take 1 tablet (500 mcg total) by mouth daily.    Marland Kitchen KLOR-CON M20 20 MEQ tablet 2 tablets in morning, 1 tablet in evening daily 270 tablet 1  . LOTEMAX 0.5 % GEL 1 drop 3 (three) times daily.     . metFORMIN (GLUCOPHAGE) 500 MG tablet Take 1 tablet (500 mg total) by mouth 2 (two) times daily with a meal. 180 tablet 3  . metoprolol (LOPRESSOR) 50 MG tablet TAKE 1 TABLET BY MOUTH TWICE A DAY 180 tablet 1  . nitroGLYCERIN (NITROSTAT) 0.4 MG SL  tablet DISSOLVE 1 TABLET UNDER THE TONUGE EVERY MINUTE AS NEEDED FOR CHEST PAIN  1  . pantoprazole (PROTONIX) 40 MG tablet TAKE 1 TABLET BY MOUTH EVERY DAY 90 tablet 0  . Potassium Chloride ER 20 MEQ TBCR 40 meq in the morning and 20 meq at bedtime 90 tablet 2  . Prenat w/o A-FeCbGl-DSS-FA-DHA (CITRANATAL ASSURE) 35-1 & 300 MG tablet Take 1 tablet by mouth daily.  11  . valACYclovir (VALTREX) 1000 MG tablet Take 2 tablets (2,000 mg total) by mouth 2 (two) times daily. For one day for a cold sore 4 tablet 3  . valsartan-hydrochlorothiazide (DIOVAN-HCT) 160-12.5 MG tablet Take 1 tablet by mouth every morning.  1   No current facility-administered medications on file prior to visit.     Review of Systems    Review of Systems  Constitutional: Negative for fever, appetite change, fatigue and unexpected weight change.  Eyes: Negative for pain and visual disturbance.  Respiratory: Negative for cough and shortness of breath.   Cardiovascular: Negative for cp or palpitations   neg for PND or orthopnea /pos for pedal edema  Gastrointestinal: Negative for nausea, diarrhea and constipation.  Genitourinary: Negative for urgency and frequency.  Skin: Negative for pallor or rash   MSK neg for leg pain  Neurological: Negative for weakness, light-headedness, numbness and headaches.  Hematological: Negative for adenopathy. Does not bruise/bleed easily.  Psychiatric/Behavioral: Negative for dysphoric mood. The patient is not nervous/anxious.      Objective:   Physical Exam  Constitutional: She appears well-developed and well-nourished. No distress.  obese and well appearing   HENT:  Head: Normocephalic and atraumatic.  Mouth/Throat: Oropharynx is clear and moist.  Eyes: Conjunctivae and EOM are normal. Pupils are equal, round, and reactive to light.  Neck: Normal range of motion. Neck supple. No JVD present. Carotid bruit is not present. No thyromegaly present.  Cardiovascular: Normal rate, regular  rhythm, normal heart sounds and intact distal pulses.  Exam reveals no gallop.   Pulmonary/Chest: Effort normal and breath sounds normal. No respiratory distress. She has no wheezes. She has no rales.  No crackles  Abdominal: Soft. Bowel sounds are normal. She exhibits no distension, no abdominal bruit and no mass. There is no tenderness.  Musculoskeletal: She exhibits no edema.  Mild ankle puffiness w/o pitting edema  No tenderness No palpable cords or erythema or warmth   Lymphadenopathy:    She has no cervical adenopathy.  Neurological: She is alert. She has normal reflexes.  Skin: Skin is warm and dry. No rash noted.  Psychiatric: She  has a normal mood and affect.          Assessment & Plan:   Problem List Items Addressed This Visit      Other   Pedal edema    With re assuring exam  Suspect multi factorial in obese female with PCOS and likely venous insuff  No red flags for cardiac source Already on diuretic Disc compression stockings/elevation of legs, inc water intake, red sodium intake  Will watch for worsening edema/sob/pnd or orthopnea  Update if not starting to improve in a week or if worsening

## 2016-12-27 NOTE — Patient Instructions (Signed)
I think your swollen ankles may be multifactorial (and your exam is re assuring)  Leg elevation and support socks and weight loss and sodium restriction and increased water intake will all help  More exercise will help also   Get most of your carbs from fruits and vegetables if possible  Avoid a lot of bread and pasta and rice and chips/crackers/snack foods and sweets  This will help you loose weight also   If symptoms suddenly worsen please follow up

## 2016-12-28 NOTE — Assessment & Plan Note (Signed)
With re assuring exam  Suspect multi factorial in obese female with PCOS and likely venous insuff  No red flags for cardiac source Already on diuretic Disc compression stockings/elevation of legs, inc water intake, red sodium intake  Will watch for worsening edema/sob/pnd or orthopnea  Update if not starting to improve in a week or if worsening

## 2017-01-08 ENCOUNTER — Other Ambulatory Visit: Payer: Self-pay | Admitting: Internal Medicine

## 2017-01-09 NOTE — Telephone Encounter (Signed)
Pt left v/m requesting refill protonix to [facility] 09/01/16. Advised refill done per protocol and pt voiced understanding.

## 2017-01-10 ENCOUNTER — Encounter: Payer: Self-pay | Admitting: Emergency Medicine

## 2017-01-10 ENCOUNTER — Ambulatory Visit (INDEPENDENT_AMBULATORY_CARE_PROVIDER_SITE_OTHER): Payer: Federal, State, Local not specified - PPO | Admitting: Family Medicine

## 2017-01-10 ENCOUNTER — Encounter: Payer: Self-pay | Admitting: Family Medicine

## 2017-01-10 VITALS — BP 154/102 | HR 94 | Temp 98.4°F | Wt 204.0 lb

## 2017-01-10 DIAGNOSIS — I10 Essential (primary) hypertension: Secondary | ICD-10-CM | POA: Diagnosis not present

## 2017-01-10 DIAGNOSIS — H6991 Unspecified Eustachian tube disorder, right ear: Secondary | ICD-10-CM | POA: Diagnosis not present

## 2017-01-10 DIAGNOSIS — F430 Acute stress reaction: Secondary | ICD-10-CM

## 2017-01-10 DIAGNOSIS — R42 Dizziness and giddiness: Secondary | ICD-10-CM

## 2017-01-10 DIAGNOSIS — G44209 Tension-type headache, unspecified, not intractable: Secondary | ICD-10-CM | POA: Diagnosis not present

## 2017-01-10 MED ORDER — ALPRAZOLAM 0.25 MG PO TABS: 0.2500 mg | ORAL_TABLET | Freq: Two times a day (BID) | ORAL | 0 refills | Status: DC | PRN

## 2017-01-10 MED ORDER — FLUTICASONE PROPIONATE 50 MCG/ACT NA SUSP: 2.0000 | Freq: Every day | NASAL | 6 refills | Status: DC

## 2017-01-10 NOTE — Patient Instructions (Addendum)
Try Melotonin 5 mg nightly for at least 2 weeks  Get some afrin type nasal spray and use 1 spray in each nostril twice a day for 3 days prior to using flonase I have sent in a nasal spray for you to use twice a day for 3 days then at bedtime for several weeks.   Take two metoprolol tonight  Take your blood pressure every other day and keep a log  Send readings to [facility]  Use xanax sparingly  [email or web address] look for resources on grief

## 2017-01-10 NOTE — Progress Notes (Signed)
Pre visit review using our clinic review tool, if applicable. No additional management support is needed unless otherwise documented below in the visit note. 

## 2017-01-10 NOTE — Progress Notes (Signed)
Subjective:    Patient ID: Debbie Walter, female    DOB: 03-14-1981, 36 y.o.   MRN: [id number]  HPI This is a 36 yo female, accompanied by her friend Rod Holler. She presents today with increased stress and headache. Her BIL passed away suddenly 2017-01-23 and she has felt a lot of pressure to support her sister and her sister's children. She started to develop pressure in her head last night. Has felt a little light headed, no dizziness. Vision was a little burry in the morning. Pain is constant and she feels pressure in ears, R>L and some pressure over eyes. Has had a viral illness with cough. Has been taking OTC cold/sinus medication over last several days.   Has increased xanax usage to 2/x day, not sleeping well. Feels like symptoms related to her BIL's sudden death. Would like increased dose of xanax.   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     Past Medical History:  Diagnosis Date  . Fibroids   . GERD (gastroesophageal reflux disease)    takes protonix  . Hypertension   . Kidney stones 5/14  . Panic attacks   . PCOS (polycystic ovarian syndrome)    takes metformin for this   Past Surgical History:  Procedure Laterality Date  . CESAREAN SECTION    . CESAREAN SECTION WITH BILATERAL TUBAL LIGATION Bilateral 05/15/2014   Procedure: CESAREAN SECTION WITH BILATERAL TUBAL LIGATION;  Surgeon: Cyril Mourning, MD;  Location: [facility];  Service: Obstetrics;  Laterality: Bilateral;  repeat  edc 06/10/14  . HYSTEROSCOPY    . PARATHYROIDECTOMY    . PARATHYROIDECTOMY     Family History  Problem Relation Age of Onset  . Hyperlipidemia Mother   . Heart disease Mother   . Diabetes Mother   . Hypertension Mother   . Hyperlipidemia Father   . Hypertension Father   . Cancer Father     rectal  . Hypertension Sister   . Anesthesia problems Neg Hx   . Hypotension Neg Hx   . Malignant hyperthermia Neg Hx   . Pseudochol  deficiency Neg Hx    Social History  Substance Use Topics  . Smoking status: Never Smoker  . Smokeless tobacco: Never Used  . Alcohol use No      Review of Systems  HENT: Positive for congestion, ear pain (pressure), rhinorrhea and sinus pressure.   Eyes: Positive for visual disturbance (blurred this morning, resolved).  Respiratory: Positive for cough (occasional, dry). Negative for chest tightness and shortness of breath.   Cardiovascular: Positive for palpitations (when feeling anxious). Negative for chest pain.  Neurological: Positive for light-headedness and headaches. Negative for dizziness.  Psychiatric/Behavioral: Negative for dysphoric mood. The patient is nervous/anxious.        Objective:   Physical Exam  Constitutional: She is oriented to person, place, and time. She appears well-developed and well-nourished. No distress.  HENT:  Head: Normocephalic and atraumatic.  Right Ear: External ear and ear canal normal. A middle ear effusion is present.  Left Ear: External ear and ear canal normal.  Mouth/Throat: Oropharynx is clear and moist.  Left TM with small amount scar tissue.   Eyes: Conjunctivae are normal. Right eye exhibits no discharge. Left eye exhibits no discharge.  Neck: Normal range of motion. Neck supple.  Cardiovascular: Normal rate, regular rhythm and normal heart sounds.   Pulmonary/Chest: Effort normal and breath sounds normal.  Lymphadenopathy:    She has no cervical adenopathy.  Neurological: She is alert and oriented to person, place, and time. No cranial nerve deficit.  Skin: Skin is warm and dry. She is not diaphoretic.  Psychiatric: She has a normal mood and affect. Her behavior is normal. Judgment and thought content normal.  Vitals reviewed.     BP (!) 162/102 (BP Location: Left Arm, Patient Position: Sitting, Cuff Size: Normal)   Pulse 94   Temp 98.4 F (36.9 C) (Oral)   Wt 204 lb (92.5 kg)   LMP 10/26/2016   SpO2 97%   BMI 37.92  kg/m  BP Readings from Last 3 Encounters:  01/10/17 (!) 162/102  12/27/16 122/78  09/21/16 132/80   Depression screen PHQ 2/9 09/01/2016 08/27/2015 02/10/2015  Decreased Interest 0 0 0  Down, Depressed, Hopeless 0 0 0  PHQ - 2 Score 0 0 0       Assessment & Plan:  1. Acute non intractable tension-type headache - likely multifactorial, recent stress, viral uri symptoms, elevated BP  2. Disorder of right eustachian tube - Provided written and verbal information regarding diagnosis and treatment. - fluticasone (FLONASE) 50 MCG/ACT nasal spray; Place 2 sprays into both nostrils daily.  Dispense: 16 g; Refill: 6  3. Light headed - increase fluids, likely related to #1,2,4  4. Stress reaction - discussed potential for dependence of alprazolam, will provide one time BID prescription - provided contact information for [facility] and encouraged her to participate in grief counseling  - ALPRAZolam (XANAX) 0.25 MG tablet; Take 1 tablet (0.25 mg total) by mouth 2 (two) times daily as needed.  Dispense: 60 tablet; Refill: 0 - encouraged good sleep hygiene and suggested a course of melatonin - regular exercise encouraged  5. Hypertension, essential - possibly related to recent cold medication - take double metoprolol tonight (for total of 100 mg) - check blood pressure at home and keep log   Clarene Reamer, FNP-BC  [facility]  01/10/2017 1:44 PM

## 2017-01-11 ENCOUNTER — Telehealth: Payer: Self-pay

## 2017-01-11 NOTE — Telephone Encounter (Signed)
Flonase OTC is the treatment for this, use as directed on label

## 2017-01-11 NOTE — Telephone Encounter (Signed)
Pt left v/m; pt was seen by Glenda Chroman FNP 01/10/17 and pt had fluid in her ear. Pt was not given any med for fluid in ears; today lt ear feels clogged and pt wonders if could be from fluid in ear. Pt also having problem hearing out of lt ear.pt request cb. [facility].

## 2017-01-12 ENCOUNTER — Encounter (HOSPITAL_COMMUNITY): Payer: Self-pay | Admitting: Emergency Medicine

## 2017-01-12 ENCOUNTER — Emergency Department (HOSPITAL_COMMUNITY)
Admission: EM | Admit: 2017-01-12 | Discharge: 2017-01-12 | Disposition: A | Discharge status: Home / Self Care | Payer: Federal, State, Local not specified - PPO | Attending: Emergency Medicine | Admitting: Emergency Medicine

## 2017-01-12 ENCOUNTER — Telehealth: Payer: Self-pay | Admitting: Internal Medicine

## 2017-01-12 ENCOUNTER — Ambulatory Visit: Payer: Federal, State, Local not specified - PPO | Admitting: Internal Medicine

## 2017-01-12 ENCOUNTER — Emergency Department (HOSPITAL_COMMUNITY): Payer: Federal, State, Local not specified - PPO

## 2017-01-12 DIAGNOSIS — Z7982 Long term (current) use of aspirin: Secondary | ICD-10-CM | POA: Diagnosis not present

## 2017-01-12 DIAGNOSIS — I10 Essential (primary) hypertension: Secondary | ICD-10-CM | POA: Diagnosis not present

## 2017-01-12 DIAGNOSIS — Z7984 Long term (current) use of oral hypoglycemic drugs: Secondary | ICD-10-CM | POA: Diagnosis not present

## 2017-01-12 DIAGNOSIS — R51 Headache: Secondary | ICD-10-CM | POA: Diagnosis not present

## 2017-01-12 DIAGNOSIS — Z79899 Other long term (current) drug therapy: Secondary | ICD-10-CM | POA: Diagnosis not present

## 2017-01-12 DIAGNOSIS — R42 Dizziness and giddiness: Secondary | ICD-10-CM | POA: Diagnosis not present

## 2017-01-12 LAB — CBC WITH DIFFERENTIAL/PLATELET
Basophils Absolute: 0 10*3/uL (ref 0.0–0.1)
Basophils Relative: 0 %
EOS ABS: 0.1 10*3/uL (ref 0.0–0.7)
EOS PCT: 1 %
HCT: 42.3 % (ref 36.0–46.0)
HEMOGLOBIN: 14.2 g/dL (ref 12.0–15.0)
Lymphocytes Relative: 19 %
Lymphs Abs: 1.8 10*3/uL (ref 0.7–4.0)
MCH: 26.7 pg (ref 26.0–34.0)
MCHC: 33.6 g/dL (ref 30.0–36.0)
MCV: 79.5 fL (ref 78.0–100.0)
Monocytes Absolute: 0.4 10*3/uL (ref 0.1–1.0)
Monocytes Relative: 4 %
NEUTROS PCT: 76 %
Neutro Abs: 7.2 10*3/uL (ref 1.7–7.7)
PLATELETS: 372 10*3/uL (ref 150–400)
RBC: 5.32 MIL/uL — AB (ref 3.87–5.11)
RDW: 14.3 % (ref 11.5–15.5)
WBC: 9.5 10*3/uL (ref 4.0–10.5)

## 2017-01-12 LAB — I-STAT CHEM 8, ED
BUN: 8 mg/dL (ref 6–20)
CALCIUM ION: 1.19 mmol/L (ref 1.15–1.40)
CHLORIDE: 103 mmol/L (ref 101–111)
Creatinine, Ser: 0.7 mg/dL (ref 0.44–1.00)
Glucose, Bld: 105 mg/dL — ABNORMAL HIGH (ref 65–99)
HCT: 44 % (ref 36.0–46.0)
Hemoglobin: 15 g/dL (ref 12.0–15.0)
POTASSIUM: 3.6 mmol/L (ref 3.5–5.1)
Sodium: 143 mmol/L (ref 135–145)
TCO2: 27 mmol/L (ref 0–100)

## 2017-01-12 LAB — I-STAT BETA HCG BLOOD, ED (MC, WL, AP ONLY)

## 2017-01-12 MED ORDER — MECLIZINE HCL 12.5 MG PO TABS: 12.5000 mg | ORAL_TABLET | Freq: Three times a day (TID) | ORAL | 0 refills | Status: DC | PRN

## 2017-01-12 NOTE — ED Notes (Signed)
Pt returned from CT °

## 2017-01-12 NOTE — ED Triage Notes (Addendum)
Pt c/o dizziness x 3 days. Pt states she saw PMD on Wednesday and has fluid in R ear and pain in R ear, pt states hearing echo in the L ear. Rx'd Afrin and has taken Afrin Wed and Thursday but not today. Pt with productive cough, yellow sputum. Pt also c/o head pressure. Pt on BP meds and has taken bp medication this morning.

## 2017-01-12 NOTE — ED Provider Notes (Signed)
[facility] DEPT Provider Note   CSN: [id number] Arrival date & time: 01/12/17  0867     History   Chief Complaint Chief Complaint  Patient presents with  . Dizziness    HPI Debbie Walter is a 36 y.o. female.  HPI Patient presents with dizziness. Has had it for the last week. Saw primary care doctor. Has had a cough with no sputum production. Mostly head congestion. States she's had some ringing in her left ear. Saw her doctor told she had fluid in her ears. Has also felt dizzy. Has a dull headache discomfort top of her head to the back of her head. No fevers. She has had a brother-in-law recently died unexpectedly and it has been bothering her. No fevers. No numbness weakness. States she's about to lose her job because she has been dizzy. She says she feels things moving around but also feels if she could pass out when she stands up.   Past Medical History:  Diagnosis Date  . Fibroids   . GERD (gastroesophageal reflux disease)    takes protonix  . Hypertension   . Kidney stones 5/14  . Panic attacks   . PCOS (polycystic ovarian syndrome)    takes metformin for this    Patient Active Problem List   Diagnosis Date Noted  . Pedal edema 12/27/2016  . Recurrent cold sores 08/23/2016  . Serum calcium elevated 01/17/2016  . Multiple thyroid nodules 01/17/2016  . Overactive thyroid gland 09/22/2015  . HTN (hypertension) 02/10/2015  . PCOS (polycystic ovarian syndrome) 02/10/2015  . Generalized anxiety disorder 02/10/2015  . GERD (gastroesophageal reflux disease) 02/10/2015  . Elevated urine levels of catecholamines 01/14/2015  . H/O hyperparathyroidism 01/14/2015    Past Surgical History:  Procedure Laterality Date  . CESAREAN SECTION    . CESAREAN SECTION WITH BILATERAL TUBAL LIGATION Bilateral 05/15/2014   Procedure: CESAREAN SECTION WITH BILATERAL TUBAL LIGATION;  Surgeon: Cyril Mourning, MD;  Location: [facility];  Service: Obstetrics;  Laterality: Bilateral;   repeat  edc 06/10/14  . HYSTEROSCOPY    . PARATHYROIDECTOMY    . PARATHYROIDECTOMY      OB History    Gravida Para Term Preterm AB Living   6 2 1 1 4 2    SAB TAB Ectopic Multiple Live Births   3   1   2        Home Medications    Prior to Admission medications   Medication Sig Start Date End Date Taking? Authorizing Provider  ALPRAZolam (XANAX) 0.25 MG tablet Take 1 tablet (0.25 mg total) by mouth 2 (two) times daily as needed. 01/10/17  Yes Elby Beck, FNP  aspirin EC 81 MG tablet Take 81 mg by mouth daily.   Yes Historical Provider, MD  busPIRone (BUSPAR) 5 MG tablet Take 1 tablet (5 mg total) by mouth daily. 09/01/16  Yes [name], NP  cholecalciferol (VITAMIN D) 1000 UNITS tablet Take 1,000 Units by mouth daily.    Yes Historical Provider, MD  cyanocobalamin 500 MCG tablet Take 1 tablet (500 mcg total) by mouth daily. Patient taking differently: Take 500 mcg by mouth every other day.  03/23/16  Yes [name], NP  ibuprofen (ADVIL,MOTRIN) 200 MG tablet Take 400 mg by mouth every 6 (six) hours as needed.   Yes Historical Provider, MD  KLOR-CON M20 20 MEQ tablet 2 tablets in morning, 1 tablet in evening daily 11/20/16  Yes [name], NP  LOTEMAX 0.5 % GEL 1  drop 3 (three) times daily.  09/19/16  Yes Historical Provider, MD  metFORMIN (GLUCOPHAGE) 500 MG tablet Take 1 tablet (500 mg total) by mouth 2 (two) times daily with a meal. 01/14/15  Yes Philemon Kingdom, MD  metoprolol (LOPRESSOR) 50 MG tablet TAKE 1 TABLET BY MOUTH TWICE A DAY 09/28/16  Yes [name], NP  oxymetazoline (AFRIN) 0.05 % nasal spray Place 1 spray into both nostrils 2 (two) times daily.   Yes Historical Provider, MD  pantoprazole (PROTONIX) 40 MG tablet TAKE 1 TABLET BY MOUTH EVERY DAY 01/09/17  Yes [name], NP  Prenat w/o A-FeCbGl-DSS-FA-DHA (CITRANATAL ASSURE) 35-1 & 300 MG tablet Take 1 tablet by mouth daily. 01/28/15  Yes Historical Provider, MD  pseudoephedrine-acetaminophen (TYLENOL  SINUS) 30-500 MG TABS tablet Take 1 tablet by mouth every 4 (four) hours as needed.   Yes Historical Provider, MD  simvastatin (ZOCOR) 40 MG tablet Take 20 mg by mouth at bedtime. 12/20/16  Yes Historical Provider, MD  valsartan-hydrochlorothiazide (DIOVAN-HCT) 160-12.5 MG tablet Take 1 tablet by mouth every morning. 02/11/16  Yes Historical Provider, MD  fluticasone (FLONASE) 50 MCG/ACT nasal spray Place 2 sprays into both nostrils daily. 01/10/17   Elby Beck, FNP  meclizine (ANTIVERT) 12.5 MG tablet Take 1 tablet (12.5 mg total) by mouth 3 (three) times daily as needed for dizziness. 01/12/17   Davonna Belling, MD  nitroGLYCERIN (NITROSTAT) 0.4 MG SL tablet DISSOLVE 1 TABLET UNDER THE TONUGE EVERY MINUTE AS NEEDED FOR CHEST PAIN 02/29/16   Historical Provider, MD  Potassium Chloride ER 20 MEQ TBCR 40 meq in the morning and 20 meq at bedtime Patient not taking: Reported on 01/12/2017 11/22/16   [name], NP    Family History Family History  Problem Relation Age of Onset  . Hyperlipidemia Mother   . Heart disease Mother   . Diabetes Mother   . Hypertension Mother   . Hyperlipidemia Father   . Hypertension Father   . Cancer Father     rectal  . Hypertension Sister   . Anesthesia problems Neg Hx   . Hypotension Neg Hx   . Malignant hyperthermia Neg Hx   . Pseudochol deficiency Neg Hx     Social History Social History  Substance Use Topics  . Smoking status: Never Smoker  . Smokeless tobacco: Never Used  . Alcohol use No     Allergies   Procardia [nifedipine]; Wellbutrin [bupropion]; Labetalol; and Zithromax [azithromycin]   Review of Systems Review of Systems  Constitutional: Positive for appetite change and fatigue.  HENT: Positive for congestion, ear pain and sinus pressure. Negative for sore throat and trouble swallowing.   Eyes: Negative for photophobia.  Respiratory: Positive for cough. Negative for shortness of breath.   Cardiovascular: Negative for chest  pain.  Gastrointestinal: Negative for abdominal pain.  Genitourinary: Negative for dysuria.  Musculoskeletal: Negative for back pain.  Skin: Negative for pallor.  Neurological: Positive for light-headedness.  Hematological: Negative for adenopathy.  Psychiatric/Behavioral: Negative for confusion.     Physical Exam Updated Vital Signs BP (!) 142/67 (BP Location: Left Arm)   Pulse 73   Temp 98 F (36.7 C) (Oral)   Resp 12   LMP 10/31/2016 (Approximate) Comment: pcos  SpO2 93%   Physical Exam  Constitutional: She appears well-developed.  HENT:  Head: Atraumatic.  Eyes: Pupils are equal, round, and reactive to light.  Neck: Neck supple.  Cardiovascular: Normal rate.   Pulmonary/Chest: Effort normal.  Abdominal: There is  no tenderness.  Neurological:  Patient symmetric. Some nystagmus with end gaze to left and right. Eye movements intact. Good dressing bilaterally. Able to stand up without unsteadiness. Finger-nose intact bilaterally.  Skin: Skin is warm. Capillary refill takes less than 2 seconds.  Psychiatric:  Patient is somewhat tearful.     ED Treatments / Results  Labs (all labs ordered are listed, but only abnormal results are displayed) Labs Reviewed  CBC WITH DIFFERENTIAL/PLATELET - Abnormal; Notable for the following:       Result Value   RBC 5.32 (*)    All other components within normal limits  I-STAT CHEM 8, ED - Abnormal; Notable for the following:    Glucose, Bld 105 (*)    All other components within normal limits  I-STAT BETA HCG BLOOD, ED ([facility], [facility], [facility] ONLY)    EKG  EKG Interpretation None       Radiology Ct Head Wo Contrast  Result Date: 01/12/2017 CLINICAL DATA:  Dizziness, headache. EXAM: CT HEAD WITHOUT CONTRAST TECHNIQUE: Contiguous axial images were obtained from the base of the skull through the vertex without intravenous contrast. COMPARISON:  CT scan of June 21, 2004. FINDINGS: Brain: No evidence of acute infarction, hemorrhage,  hydrocephalus, extra-axial collection or mass lesion/mass effect. Vascular: No hyperdense vessel or unexpected calcification. Skull: Normal. Negative for fracture or focal lesion. Sinuses/Orbits: No acute finding. Other: None. IMPRESSION: Normal head CT. Electronically Signed   By: Marijo Conception, M.D.   On: 01/12/2017 12:04    Procedures Procedures (including critical care time)  Medications Ordered in ED Medications - No data to display   Initial Impression / Assessment and Plan / ED Course  I have reviewed the triage vital signs and the nursing notes.  Pertinent labs & imaging results that were available during my care of the patient were reviewed by me and considered in my medical decision making (see chart for details).     Patient with vertigo. Also may have a lightheadedness component. Has URI symptoms. Feels better after treatment. Also likely has some anxiety due to family issues. Nonfocal exam. CT scan done and reassuring. Discharge home.  Final Clinical Impressions(s) / ED Diagnoses   Final diagnoses:  Vertigo  Lightheadedness    New Prescriptions Discharge Medication List as of 01/12/2017 12:20 PM    START taking these medications   Details  meclizine (ANTIVERT) 12.5 MG tablet Take 1 tablet (12.5 mg total) by mouth 3 (three) times daily as needed for dizziness., Starting Fri 01/12/2017, Print         Davonna Belling, MD 01/12/17 1630

## 2017-01-12 NOTE — ED Notes (Signed)
[place] at bedside.  

## 2017-01-12 NOTE — ED Notes (Signed)
Pt transported to CT ?

## 2017-01-12 NOTE — Telephone Encounter (Signed)
Pt is in ED for fluid in ears and feeling dizzy

## 2017-01-12 NOTE — Telephone Encounter (Signed)
Pt stated her hr will be sending paperwork for FMLA  To be fill out.  Its for anxiety

## 2017-01-15 NOTE — Telephone Encounter (Signed)
Done, given back to Robin 

## 2017-01-15 NOTE — Telephone Encounter (Signed)
FMLA paperwork in Debbie Walter's in box for review and signature Pt stated she will be here tomorrow to see you

## 2017-01-16 ENCOUNTER — Ambulatory Visit (INDEPENDENT_AMBULATORY_CARE_PROVIDER_SITE_OTHER): Payer: Federal, State, Local not specified - PPO | Admitting: Internal Medicine

## 2017-01-16 ENCOUNTER — Encounter: Payer: Self-pay | Admitting: Internal Medicine

## 2017-01-16 VITALS — BP 126/84 | HR 82 | Temp 98.5°F | Wt 206.0 lb

## 2017-01-16 DIAGNOSIS — R42 Dizziness and giddiness: Secondary | ICD-10-CM | POA: Diagnosis not present

## 2017-01-16 DIAGNOSIS — F419 Anxiety disorder, unspecified: Secondary | ICD-10-CM | POA: Diagnosis not present

## 2017-01-16 DIAGNOSIS — Z7689 Persons encountering health services in other specified circumstances: Secondary | ICD-10-CM

## 2017-01-16 DIAGNOSIS — I10 Essential (primary) hypertension: Secondary | ICD-10-CM

## 2017-01-16 MED ORDER — FLUOXETINE HCL 10 MG PO TABS: 10.0000 mg | ORAL_TABLET | Freq: Every day | ORAL | 2 refills | Status: DC

## 2017-01-16 MED ORDER — PREDNISONE 10 MG PO TABS: ORAL_TABLET | ORAL | 0 refills | Status: DC

## 2017-01-16 NOTE — Patient Instructions (Signed)
Generalized Anxiety Disorder, Adult Generalized anxiety disorder (GAD) is a mental health disorder. People with this condition constantly worry about everyday events. Unlike normal anxiety, worry related to GAD is not triggered by a specific event. These worries also do not fade or get better with time. GAD interferes with life functions, including relationships, work, and school. GAD can vary from mild to severe. People with severe GAD can have intense waves of anxiety with physical symptoms (panic attacks). What are the causes? The exact cause of GAD is not known. What increases the risk? This condition is more likely to develop in:  Women.  People who have a family history of anxiety disorders.  People who are very shy.  People who experience very stressful life events, such as the death of a loved one.  People who have a very stressful family environment. What are the signs or symptoms? People with GAD often worry excessively about many things in their lives, such as their health and family. They may also be overly concerned about:  Doing well at work.  Being on time.  Natural disasters.  Friendships. Physical symptoms of GAD include:  Fatigue.  Muscle tension or having muscle twitches.  Trembling or feeling shaky.  Being easily startled.  Feeling like your heart is pounding or racing.  Feeling out of breath or like you cannot take a deep breath.  Having trouble falling asleep or staying asleep.  Sweating.  Nausea, diarrhea, or irritable bowel syndrome (IBS).  Headaches.  Trouble concentrating or remembering facts.  Restlessness.  Irritability. How is this diagnosed? Your health care provider can diagnose GAD based on your symptoms and medical history. You will also have a physical exam. The health care provider will ask specific questions about your symptoms, including how severe they are, when they started, and if they come and go. Your health care  provider may ask you about your use of alcohol or drugs, including prescription medicines. Your health care provider may refer you to a mental health specialist for further evaluation. Your health care provider will do a thorough examination and may perform additional tests to rule out other possible causes of your symptoms. To be diagnosed with GAD, a person must have anxiety that:  Is out of his or her control.  Affects several different aspects of his or her life, such as work and relationships.  Causes distress that makes him or her unable to take part in normal activities.  Includes at least three physical symptoms of GAD, such as restlessness, fatigue, trouble concentrating, irritability, muscle tension, or sleep problems. Before your health care provider can confirm a diagnosis of GAD, these symptoms must be present more days than they are not, and they must last for six months or longer. How is this treated? The following therapies are usually used to treat GAD:  Medicine. Antidepressant medicine is usually prescribed for long-term daily control. Antianxiety medicines may be added in severe cases, especially when panic attacks occur.  Talk therapy (psychotherapy). Certain types of talk therapy can be helpful in treating GAD by providing support, education, and guidance. Options include:  Cognitive behavioral therapy (CBT). People learn coping skills and techniques to ease their anxiety. They learn to identify unrealistic or negative thoughts and behaviors and to replace them with positive ones.  Acceptance and commitment therapy (ACT). This treatment teaches people how to be mindful as a way to cope with unwanted thoughts and feelings.  Biofeedback. This process trains you to manage your body's response (  physiological response) through breathing techniques and relaxation methods. You will work with a therapist while machines are used to monitor your physical symptoms.  Stress  management techniques. These include yoga, meditation, and exercise. A mental health specialist can help determine which treatment is best for you. Some people see improvement with one type of therapy. However, other people require a combination of therapies. Follow these instructions at home:  Take over-the-counter and prescription medicines only as told by your health care provider.  Try to maintain a normal routine.  Try to anticipate stressful situations and allow extra time to manage them.  Practice any stress management or self-calming techniques as taught by your health care provider.  Do not punish yourself for setbacks or for not making progress.  Try to recognize your accomplishments, even if they are small.  Keep all follow-up visits as told by your health care provider. This is important. Contact a health care provider if:  Your symptoms do not get better.  Your symptoms get worse.  You have signs of depression, such as:  A persistently sad, cranky, or irritable mood.  Loss of enjoyment in activities that used to bring you joy.  Change in weight or eating.  Changes in sleeping habits.  Avoiding friends or family members.  Loss of energy for normal tasks.  Feelings of guilt or worthlessness. Get help right away if:  You have serious thoughts about hurting yourself or others. If you ever feel like you may hurt yourself or others, or have thoughts about taking your own life, get help right away. You can go to your nearest emergency department or call:  Your local emergency services (911 in the [place]  A suicide crisis helpline, such as the National Suicide Prevention Lifeline at 1-800-273-8255. This is open 24 hours a day. Summary  Generalized anxiety disorder (GAD) is a mental health disorder that involves worry that is not triggered by a specific event.  People with GAD often worry excessively about many things in their lives, such as their health and  family.  GAD may cause physical symptoms such as restlessness, trouble concentrating, sleep problems, frequent sweating, nausea, diarrhea, headaches, and trembling or muscle twitching.  A mental health specialist can help determine which treatment is best for you. Some people see improvement with one type of therapy. However, other people require a combination of therapies. This information is not intended to replace advice given to you by your health care provider. Make sure you discuss any questions you have with your health care provider. Document Released: 12/30/2012 Document Revised: 07/25/2016 Document Reviewed: 07/25/2016 Elsevier Interactive Patient Education  2017 [employer]  

## 2017-01-16 NOTE — Telephone Encounter (Signed)
Paperwork faxed 5/1 Copy for pt  Copy for file Copy for scan  Pt aware

## 2017-01-16 NOTE — Progress Notes (Signed)
Subjective:    Patient ID: Debbie Walter, female    DOB: 05/03/1981, 36 y.o.   MRN: [id number]  HPI  Pt presents to the clinic today for ER folllow up. She went to the ER 4/27 with c/o dizziness. CT scan of the brain showed no acute abnormalities. She was diagnosed with vertigo and given a RX for Meclizine. Since discharge, she c/o headache and ear fullness. The headache is located on the top and the back of her head. She describes the pain as pressure. She denies visual changes. She denies runny nose, nasal congestion or sore throat. She has tried the [facility] and Meclizine without much improvement. She denies recent head trauma.  She also reports that recently, Cardiology increased her Metoprolol to 100 mg daily, due to her elevated blood pressure. She has continued her current dose of Valsartan HCT. She reports her blood pressures at home have been much better. Her BP today is 126/84.  She also reports worsening anxiety. She reports a few weeks ago, her brother-in-law aw, passed away. He died suddenly, ossible massive heart attack. She has been taking her Buspar once daily. Her Xanax was  increased to BID by [facility]. She feels like her anxiety is not well managed at this time. She has tried Lexapro in the past, but did not like the way it made her feel. She is been set up with therapy through her employer, but has not had her last appt yet. She is requesting additional treatment for her anxiety.  Review of Systems  Past Medical History:  Diagnosis Date  . Fibroids   . GERD (gastroesophageal reflux disease)    takes protonix  . Hypertension   . Kidney stones 5/14  . Panic attacks   . PCOS (polycystic ovarian syndrome)    takes metformin for this    Current Outpatient Prescriptions  Medication Sig Dispense Refill  . ALPRAZolam (XANAX) 0.25 MG tablet Take 1 tablet (0.25 mg total) by mouth 2 (two) times daily as needed. 60 tablet 0  . aspirin EC 81 MG tablet Take 81 mg by mouth  daily.    . busPIRone (BUSPAR) 5 MG tablet Take 1 tablet (5 mg total) by mouth daily. 90 tablet 3  . cholecalciferol (VITAMIN D) 1000 UNITS tablet Take 1,000 Units by mouth daily.     . cyanocobalamin 500 MCG tablet Take 1 tablet (500 mcg total) by mouth daily. (Patient taking differently: Take 500 mcg by mouth every other day. )    . fluticasone (FLONASE) 50 MCG/ACT nasal spray Place 2 sprays into both nostrils daily. 16 g 6  . ibuprofen (ADVIL,MOTRIN) 200 MG tablet Take 400 mg by mouth every 6 (six) hours as needed.    Marland Kitchen KLOR-CON M20 20 MEQ tablet 2 tablets in morning, 1 tablet in evening daily 270 tablet 1  . LOTEMAX 0.5 % GEL 1 drop 3 (three) times daily.     . meclizine (ANTIVERT) 12.5 MG tablet Take 1 tablet (12.5 mg total) by mouth 3 (three) times daily as needed for dizziness. 10 tablet 0  . metFORMIN (GLUCOPHAGE) 500 MG tablet Take 1 tablet (500 mg total) by mouth 2 (two) times daily with a meal. 180 tablet 3  . metoprolol (LOPRESSOR) 50 MG tablet TAKE 1 TABLET BY MOUTH TWICE A DAY 180 tablet 1  . nitroGLYCERIN (NITROSTAT) 0.4 MG SL tablet DISSOLVE 1 TABLET UNDER THE TONUGE EVERY MINUTE AS NEEDED FOR CHEST PAIN  1  . oxymetazoline (AFRIN) 0.05 %  nasal spray Place 1 spray into both nostrils 2 (two) times daily.    . pantoprazole (PROTONIX) 40 MG tablet TAKE 1 TABLET BY MOUTH EVERY DAY 90 tablet 1  . Potassium Chloride ER 20 MEQ TBCR 40 meq in the morning and 20 meq at bedtime (Patient not taking: Reported on 01/12/2017) 90 tablet 2  . Prenat w/o A-FeCbGl-DSS-FA-DHA (CITRANATAL ASSURE) 35-1 & 300 MG tablet Take 1 tablet by mouth daily.  11  . pseudoephedrine-acetaminophen (TYLENOL SINUS) 30-500 MG TABS tablet Take 1 tablet by mouth every 4 (four) hours as needed.    . simvastatin (ZOCOR) 40 MG tablet Take 20 mg by mouth at bedtime.  5  . valsartan-hydrochlorothiazide (DIOVAN-HCT) 160-12.5 MG tablet Take 1 tablet by mouth every morning.  1   No current facility-administered medications for  this visit.     Allergies  Allergen Reactions  . Procardia [Nifedipine] Other (See Comments)    headache  . Wellbutrin [Bupropion] Other (See Comments)    "Makes my blood pressure go up - I don't ever want to take it again"  . Labetalol Other (See Comments)    "makes me feel like things are crawling through my head"  . Zithromax [Azithromycin] Other (See Comments)    Gi upset.  Diarrhea    Family History  Problem Relation Age of Onset  . Hyperlipidemia Mother   . Heart disease Mother   . Diabetes Mother   . Hypertension Mother   . Hyperlipidemia Father   . Hypertension Father   . Cancer Father     rectal  . Hypertension Sister   . Anesthesia problems Neg Hx   . Hypotension Neg Hx   . Malignant hyperthermia Neg Hx   . Pseudochol deficiency Neg Hx     Social History   Social History  . Marital status: Married    Spouse name: N/A  . Number of children: N/A  . Years of education: N/A   Occupational History  . Not on file.   Social History Main Topics  . Smoking status: Never Smoker  . Smokeless tobacco: Never Used  . Alcohol use No  . Drug use: No  . Sexual activity: Yes    Birth control/ protection: None   Other Topics Concern  . Not on file   Social History Narrative  . No narrative on file     Constitutional: Pt reports headaches. Denies fever, malaise, fatigue, or abrupt weight changes.  HEENT: Pt reports ear fullness. Denies eye pain, eye redness, ear pain, ringing in the ears, wax buildup, runny nose, nasal congestion, bloody nose, or sore throat. Respiratory: Denies difficulty breathing, shortness of breath, cough or sputum production.   Cardiovascular: Denies chest pain, chest tightness, palpitations or swelling in the hands or feet.   Neurological: Pt reports intermittent dizziness. Denies difficulty with memory, difficulty with speech or problems with balance and coordination.  Psych: Pt reports anxiety. Denies depression, SI/HI.  No other  specific complaints in a complete review of systems (except as listed in HPI above).     Objective:   Physical Exam   BP 126/84   Pulse 82   Temp 98.5 F (36.9 C) (Oral)   Wt 206 lb (93.4 kg)   SpO2 98%   BMI 38.29 kg/m  Wt Readings from Last 3 Encounters:  01/16/17 206 lb (93.4 kg)  01/10/17 204 lb (92.5 kg)  12/27/16 208 lb 6.4 oz (94.5 kg)    General: Appears her stated age, obese in NAD.  HEENT: Ears: Tm's gray and intact, normal light reflex, + serous effusion bilaterally;   Cardiovascular: Normal rate and rhythm.  Pulmonary/Chest: Normal effort and positive vesicular breath sounds. No respiratory distress. No wheezes, rales or ronchi noted.  Neurological: Alert and oriented. Coordination normal.  Psychiatric: She is very anxious appearing today.  BMET    Component Value Date/Time   NA 143 01/12/2017 0920   K 3.6 01/12/2017 0920   CL 103 01/12/2017 0920   CO2 32 09/01/2016 1504   GLUCOSE 105 (H) 01/12/2017 0920   BUN 8 01/12/2017 0920   CREATININE 0.70 01/12/2017 0920   CREATININE 0.76 02/26/2015 1654   CALCIUM 9.6 09/01/2016 1504   GFRNONAA >90 04/29/2014 0513   GFRAA >90 04/29/2014 0513    Lipid Panel     Component Value Date/Time   CHOL 120 09/01/2016 1504   TRIG 112.0 09/01/2016 1504   HDL 40.50 09/01/2016 1504   CHOLHDL 3 09/01/2016 1504   VLDL 22.4 09/01/2016 1504   LDLCALC 57 09/01/2016 1504    CBC    Component Value Date/Time   WBC 9.5 01/12/2017 0907   RBC 5.32 (H) 01/12/2017 0907   HGB 15.0 01/12/2017 0920   HCT 44.0 01/12/2017 0920   PLT 372 01/12/2017 0907   MCV 79.5 01/12/2017 0907   MCH 26.7 01/12/2017 0907   MCHC 33.6 01/12/2017 0907   RDW 14.3 01/12/2017 0907   LYMPHSABS 1.8 01/12/2017 0907   MONOABS 0.4 01/12/2017 0907   EOSABS 0.1 01/12/2017 0907   BASOSABS 0.0 01/12/2017 0907    Hgb A1C Lab Results  Component Value Date   HGBA1C 6.0 09/01/2016          Assessment & Plan:   ER follow up for Vertigo:  ER  notes, labs and imaging reviewed  Stop Flonase eRx for Pred Taper x 6 days Make sure you are drinking plenty of water  HTN:  Continue Metoprolol and Valsartan HCT as prescribed by cardiology Will monitor BP  Anxiety:   Deteriorated Advised her I do not want her taking Xanax as schedule, and at her next refill, we will go back to #20 Continue Buspar eRx for Prozac 10 mg daily- update me in 4 weeks to let me know how you are doing.  Return precautions discussed Webb Silversmith, NP

## 2017-01-17 DIAGNOSIS — I10 Essential (primary) hypertension: Secondary | ICD-10-CM | POA: Diagnosis not present

## 2017-01-17 DIAGNOSIS — Z6839 Body mass index (BMI) 39.0-39.9, adult: Secondary | ICD-10-CM | POA: Diagnosis not present

## 2017-01-17 DIAGNOSIS — R0602 Shortness of breath: Secondary | ICD-10-CM | POA: Diagnosis not present

## 2017-01-18 ENCOUNTER — Telehealth: Payer: Self-pay

## 2017-01-18 NOTE — Telephone Encounter (Signed)
Pt left v/m; pt last seen by Avie Echevaria NP on 01/16/17. Pt needs to take time off from work and pt wants to get short term disability and that has to be approved by PCP; pt has to take time off from work and would prefer short term disability but if not pt will take personal leave from work and pt will not get paid. Pt needs to get paid while off work; pt has high stress level; pt seen by cardiologist on 01/17/17 and BP was 200/110. Pt is going to discuss time off from work with human resources today and pt request cb ASAP if Avie Echevaria NP will approve short term disability.

## 2017-01-18 NOTE — Telephone Encounter (Signed)
[facility] faxed fmla paperwork I spoke with Brayton Layman She stated pt wanted continuously leave stated 01/17/17 to 02/17/17 Paperwork in [place]

## 2017-01-18 NOTE — Telephone Encounter (Signed)
Spoke to pt she is aware that [facility] has paperwork Pt stated she was approached by manager to take time off.  Pt wants 6 weeks leave

## 2017-01-19 DIAGNOSIS — Z7689 Persons encountering health services in other specified circumstances: Secondary | ICD-10-CM

## 2017-01-19 NOTE — Telephone Encounter (Signed)
Done, given to Robin 

## 2017-01-19 NOTE — Telephone Encounter (Signed)
Paperwork faxed °

## 2017-01-22 NOTE — Telephone Encounter (Signed)
[employer] short term disabiltiy In regina's in box for review and signature

## 2017-01-22 NOTE — Telephone Encounter (Signed)
Left message asking pt to call office  ? About forms  paperwork for [facility] has been faxed Received paperwork from [employer] does this need to be filled out also

## 2017-01-24 DIAGNOSIS — K080 Exfoliation of teeth due to systemic causes: Secondary | ICD-10-CM | POA: Diagnosis not present

## 2017-01-24 NOTE — Telephone Encounter (Signed)
Done, given to Robin 

## 2017-01-24 NOTE — Telephone Encounter (Signed)
Paperwork faxed °

## 2017-01-25 NOTE — Telephone Encounter (Signed)
Paperwork faxed Copy for file Copy for scan copy for pt Copy for billing Pt aware

## 2017-01-26 ENCOUNTER — Ambulatory Visit: Payer: Federal, State, Local not specified - PPO | Admitting: Internal Medicine

## 2017-01-26 DIAGNOSIS — Z6839 Body mass index (BMI) 39.0-39.9, adult: Secondary | ICD-10-CM | POA: Diagnosis not present

## 2017-01-26 DIAGNOSIS — R0602 Shortness of breath: Secondary | ICD-10-CM | POA: Diagnosis not present

## 2017-01-26 DIAGNOSIS — Z01419 Encounter for gynecological examination (general) (routine) without abnormal findings: Secondary | ICD-10-CM | POA: Diagnosis not present

## 2017-01-26 DIAGNOSIS — I10 Essential (primary) hypertension: Secondary | ICD-10-CM | POA: Diagnosis not present

## 2017-02-08 ENCOUNTER — Ambulatory Visit (INDEPENDENT_AMBULATORY_CARE_PROVIDER_SITE_OTHER): Payer: Federal, State, Local not specified - PPO | Admitting: Internal Medicine

## 2017-02-08 ENCOUNTER — Encounter: Payer: Self-pay | Admitting: Internal Medicine

## 2017-02-08 VITALS — BP 128/82 | HR 60 | Wt 201.0 lb

## 2017-02-08 DIAGNOSIS — R825 Elevated urine levels of drugs, medicaments and biological substances: Secondary | ICD-10-CM

## 2017-02-08 DIAGNOSIS — E282 Polycystic ovarian syndrome: Secondary | ICD-10-CM

## 2017-02-08 DIAGNOSIS — Z8639 Personal history of other endocrine, nutritional and metabolic disease: Secondary | ICD-10-CM

## 2017-02-08 DIAGNOSIS — R7303 Prediabetes: Secondary | ICD-10-CM | POA: Diagnosis not present

## 2017-02-08 LAB — BASIC METABOLIC PANEL WITH GFR
BUN: 10 mg/dL (ref 7–25)
CO2: 28 mmol/L (ref 20–31)
Calcium: 9.7 mg/dL (ref 8.6–10.2)
Chloride: 105 mmol/L (ref 98–110)
Creat: 0.76 mg/dL (ref 0.50–1.10)
GFR, Est Non African American: >89 mL/min (ref >=60)
Glucose, Bld: 94 mg/dL (ref 65–99)
POTASSIUM: 3.7 mmol/L (ref 3.5–5.3)
SODIUM: 142 mmol/L (ref 135–146)

## 2017-02-08 NOTE — Patient Instructions (Addendum)
Please stop at the lab.  Please return in 1 year.  Read the following book: Dr. Alyssa Grove - Program for Reversing Diabetes Dr. Alden Benjamin - How Not to Die

## 2017-02-08 NOTE — Progress Notes (Signed)
Patient ID: KHAMARI YOUSUF, female   DOB: 03/03/1981, 36 y.o.   MRN: [id number]    HPI  DORTHEY DEPACE is a 36 y.o.-year-old female, initially referred by Dr. Elijio Miles, for management of elevated metanephrines, hypercalcemia, elevated HbA1c, thyroid nodules. Last visit 1 year ago. PCP: [name], NP  She started Prozac and now back on Buspar in am as she was stressed as had a death in the family. She is feeling much better on these medicines.  She had higher BP >> meds changed >> now on: - Valsartan/HCTZ 320/25 increased - Amlodipine 5 mg daily at night - Metoprolol changed to Propranolol 160 daily  She started to have menses again - every 2-3 months, but had Provera at hand.  Elevated urinary metanephrines: Reviewed and addended hx:  Pt. has been dx with hypertension dx at 36 (2003), had preeclampsia with her last pregnancy- was on Alpha methyl-dopa >> BP controlled then. This med did not help for the second pregnancy >> was on Metoprolol and Verapamil. She was on:In 2015,  - Metoprolol 50 mg bid - has been on this for years - Valsartan 160 mg daily - Chlorthalidone 25 mg daily She was on Amlodipine 5 mg daily >> dropped BP too low and developed dizziness.  During investigation for hypertension, she had a 24-hour urine fractionated metanephrine level checked in 07/20/2014 and the metanephrines returned slightly elevated as shown below. Patient mentions that she was still drinking caffeinated drinks and wine cooler during the period of the collection: Total volume 2.6 L Metanephrine 233 g per 24 hour (36-190) Normetanephrine 382 (35-482) Total metanephrines 614 (191-478).  Of note, at that time, she had the PRA 0.44 (0.25-5.8) with a potassium of 4.1. The test was run at [facility].  She had a repeat 24-hour urine for fractionated metanephrines, which again showed a slightly elevated metanephrine, practically unchanged from prior, despite being off caffeine and alcohol (not  on Buspar yet):  Total volume 1.9 L Metanephrine 238 g per 24 hour (36-190) Normetanephrine 282 (35-482) Total metanephrines 520 (295-621).   She had 2 abdominal CT scans, in 2009 and 2014 and there were no adrenal masses.  01/07/2015: normal plasma metanephrines, normal urinary catecholamines, slightly elevated urinary metanephrines: Component     Latest Ref Rng & Units 01/07/2015 (On BuSpar) 07/26/2015  Total Volume - CF 24Hr U     mL 1,500   Epinephrine, 24 hr Urine      undetectably low   Norepinephrine, 24 hr Ur     15 - 100 mcg/24 h 55   Calculated Total (E+NE)     26 - 121 mcg/24 h 55   Dopamine, 24 hr Urine     52 - 480 mcg/24 h 394   Creatinine, Urine mg/day-CATEUR     0.63 - 2.50 g/24 h 1.70   Metanephrine, Pl     <=57 pg/mL 45   Normetanephrine, Pl     <=148 pg/mL 47   Total Metanephrines-Plasma     <=205 pg/mL 92   Metanephrines, Ur     36 - 190 mcg/24 h 280 (H) 250 (H)  Normetanephr.,U,24h     35 - 482 mcg/24 h 287 285  Metanephrine, Ur     115 - 695 mcg/24 h 567 535  Creatinine, Urine     mg/dL 123.2   Creatinine, 24H Ur     700 - 1,800 mg/day 1,848 (H)    Due to persistently elevated metanephrines, we checked an MIBG  scan (09/10/2015) (she had to pay 1400$ out of pocket!) >> negative for Pheochromocytoma or paraganglioma:  No abnormal accumulation of radiotracer on the whole-body planar imaging. Physiologic uptake is noted in the liver and to lesser degree lungs and GI tract.  Focal activity is noted in the LEFT neck. Dedicated planar and SPECT imaging of the neck demonstrate this activity to localize to the blocked LEFT lobe of thyroid gland. Activity in blocked thyroid glands can be seen in a minority of cases. Presumed RIGHT hemi thyroidectomy in the past.  IMPRESSION: 1. No evidence of pheochromocytoma on whole-body MIBG scan. 2. 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  We did not repeat the urinary metanephrines at  last visit, in 01/2016.   As she had increased activity in the left lobe on the MIBG scan, we checked a thyroid ultrasound (10/01/2015) >> only small subcentimeter nodules, not worrisome:  Thyroid ultrasound (10/01/2015):   Right thyroid lobe: Surgically absent  Left thyroid lobe: 5.9 x 1.6 x 2.5 cm. Homogeneous left thyroid gland echotexture. Small incidental hypoechoic left mid and lower pole nodules (3) all measuring 7 mm or less in size. These remain nonspecific.  Isthmus Thickness: 3 mm. No nodules visualized.  Lymphadenopathy: None visualized.  IMPRESSION: Previous right thyroidectomy. No abnormality demonstrated by ultrasound in the right thyroid bed. Incidental sub cm left mid and lower pole nodules (3), all measuring 7 mm or less in size. No adenopathy Pt denies: - feeling nodules in neck - hoarseness - dysphagia - choking - SOB with lying down  No FH of thyroid cancer. FH with HTN: M,F,S. No pituitary tumor FH.  Hyperparathyroidism/hypercalcemia: Patient describes that she has a history of R parathyroidectomy at 36 y/o. She had HTN then. She saw endo then >> Dr Windell Norfolk >> had low calcium then >> anxiety >> started to take calcium. She was on calcium 600-400 mg-U every other day (switched after calcium 10.7 on 07/16/2014). Calcium was 11 on 09/03/2014.   She had a kidney stone and had lithotripsy in 2014.  Reviewed pertinent labs:  Lab Results  Component Value Date   PTH 53 07/16/2015   PTH Comment 07/16/2015   PTH 57 12/02/2014   CALCIUM 9.6 09/01/2016   CALCIUM 10.3 01/18/2016   CALCIUM 10.7 (H) 01/14/2016   CALCIUM 10.3 10/18/2015   CALCIUM 10.1 08/20/2015   CALCIUM 10.4 (H) 07/16/2015   CALCIUM 10.4 02/26/2015   CALCIUM 10.3 12/02/2014   CALCIUM 10.4 04/29/2014   CALCIUM 11.0 (H) 04/28/2014   12/15/2014:  Calcium 10.9 (8.4-10.5) - took Tums before the test!, magnesium 1.8 (1.5-2.5), BUN/creatinine 10/0.63  04/27/2015: Calcium 11.2  (8.7-10.2)  06/09/2015:  Ionized calcium: 5.2 (4.5-5.6), calcium 9.8 (8.7-10.2), magnesium 2.1 (1.6-2.3), potassium 3.4 (3.5-5.2). She is on vit D 1000 units daily.  She had a normal 24 hour urinary calcium:  Component     Latest Ref Rng 07/26/2015          Creatinine, Urine     20 - 320 mg/dL 88  Creatinine, 24H Ur     0.63 - 2.50 g/24 h 2.29  Calcium, Ur     Not estab mg/dL 7  Calcium, 24 hour urine     35 - 250 mg/24 h 182   She is taking vitamin D 1000 units daily. Last vitamin D level:  Lab Results  Component Value Date   VD25OH 37.07 01/14/2016   VD25OH 39.42 07/16/2015   VD25OH 39.36 12/02/2014   VD25OH 60 11/11/2013  Prediabetes:  She has a diagnosis of PCOS. The testosterone at last visit was elevated. Not on OCPs. She had tubal ligation in the past. She continues on metformin 500 mg twice a day.  Latest hemoglobin A1c: Lab Results  Component Value Date   HGBA1C 6.0 09/01/2016   HGBA1C 6.3 01/14/2016   HGBA1C 5.7 07/16/2015   She is contemplating sleep gastrectomy.  ROS: Constitutional: no weight gain/no weight loss, no fatigue, no subjective hyperthermia, no subjective hypothermia Eyes: no blurry vision, no xerophthalmia ENT: no sore throat, no nodules palpated in throat, no dysphagia, no odynophagia, no hoarseness Cardiovascular: no CP/no SOB/no palpitations/no leg swelling Respiratory: no cough/no SOB/no wheezing Gastrointestinal: no N/no V/no D/no C/no acid reflux Musculoskeletal: no muscle aches/no joint aches Skin: no rashes, no hair loss Neurological: no tremors/no numbness/no tingling/no dizziness +  anxiety   I reviewed pt's medications, allergies, PMH, social hx, family hx, and changes were documented in the history of present illness. Otherwise, unchanged from my initial visit note.   Past Medical History:  Diagnosis Date  . Fibroids   . GERD (gastroesophageal reflux disease)    takes protonix  . Hypertension   . Kidney stones 5/14  .  Panic attacks   . PCOS (polycystic ovarian syndrome)    takes metformin for this   Past Surgical History:  Procedure Laterality Date  . CESAREAN SECTION    . CESAREAN SECTION WITH BILATERAL TUBAL LIGATION Bilateral 05/15/2014   Procedure: CESAREAN SECTION WITH BILATERAL TUBAL LIGATION;  Surgeon: Cyril Mourning, MD;  Location: [facility];  Service: Obstetrics;  Laterality: Bilateral;  repeat  edc 06/10/14  . HYSTEROSCOPY    . PARATHYROIDECTOMY    . PARATHYROIDECTOMY     History   Social History  . Marital Status: Married    Spouse Name: N/A    Number of Children: 2 (4 miscarriages)   Occupational History  . Nurse    Social History Main Topics  . Smoking status: Never Smoker   . Smokeless tobacco: Never Used  . Alcohol Use: No  . Drug Use: No  . Sexual Activity: Yes   Current Outpatient Prescriptions on File Prior to Visit  Medication Sig Dispense Refill  . ALPRAZolam (XANAX) 0.25 MG tablet Take 1 tablet (0.25 mg total) by mouth 2 (two) times daily as needed. 60 tablet 0  . aspirin EC 81 MG tablet Take 81 mg by mouth daily.    . busPIRone (BUSPAR) 5 MG tablet Take 1 tablet (5 mg total) by mouth daily. 90 tablet 3  . cholecalciferol (VITAMIN D) 1000 UNITS tablet Take 1,000 Units by mouth daily.     . cyanocobalamin 500 MCG tablet Take 1 tablet (500 mcg total) by mouth daily. (Patient taking differently: Take 500 mcg by mouth every other day. )    . FLUoxetine (PROZAC) 10 MG tablet Take 1 tablet (10 mg total) by mouth daily. 30 tablet 2  . ibuprofen (ADVIL,MOTRIN) 200 MG tablet Take 400 mg by mouth every 6 (six) hours as needed.    Marland Kitchen KLOR-CON M20 20 MEQ tablet 2 tablets in morning, 1 tablet in evening daily 270 tablet 1  . LOTEMAX 0.5 % GEL 1 drop 3 (three) times daily.     . metFORMIN (GLUCOPHAGE) 500 MG tablet Take 1 tablet (500 mg total) by mouth 2 (two) times daily with a meal. 180 tablet 3  . nitroGLYCERIN (NITROSTAT) 0.4 MG SL tablet DISSOLVE 1 TABLET UNDER THE TONUGE EVERY  MINUTE AS NEEDED FOR  CHEST PAIN  1  . pantoprazole (PROTONIX) 40 MG tablet TAKE 1 TABLET BY MOUTH EVERY DAY 90 tablet 1  . Potassium Chloride ER 20 MEQ TBCR 40 meq in the morning and 20 meq at bedtime 90 tablet 2  . Prenat w/o A-FeCbGl-DSS-FA-DHA (CITRANATAL ASSURE) 35-1 & 300 MG tablet Take 1 tablet by mouth daily.  11  . simvastatin (ZOCOR) 40 MG tablet Take 20 mg by mouth at bedtime.  5  . valsartan-hydrochlorothiazide (DIOVAN-HCT) 160-12.5 MG tablet Take 1 tablet by mouth every morning.   1  . pseudoephedrine-acetaminophen (TYLENOL SINUS) 30-500 MG TABS tablet Take 1 tablet by mouth every 4 (four) hours as needed.     No current facility-administered medications on file prior to visit.    Allergies  Allergen Reactions  . Procardia [Nifedipine] Other (See Comments)    headache  . Wellbutrin [Bupropion] Other (See Comments)    "Makes my blood pressure go up - I don't ever want to take it again"  . Labetalol Other (See Comments)    "makes me feel like things are crawling through my head"  . Zithromax [Azithromycin] Other (See Comments)    Gi upset.  Diarrhea   Family History  Problem Relation Age of Onset  . Hyperlipidemia Mother   . Heart disease Mother   . Diabetes Mother   . Hypertension Mother   . Hyperlipidemia Father   . Hypertension Father   . Cancer Father        rectal  . Hypertension Sister   . Anesthesia problems Neg Hx   . Hypotension Neg Hx   . Malignant hyperthermia Neg Hx   . Pseudochol deficiency Neg Hx     diabetes in mother  Hypertension in mother and father  Acute MI in mother  Rectal cancer in father  PE: BP 128/82 (BP Location: Left Arm, Patient Position: Sitting)   Pulse 60   Wt 201 lb (91.2 kg)   LMP 02/04/2017   SpO2 96%   BMI 37.36 kg/m  Body mass index is 37.36 kg/m. Wt Readings from Last 3 Encounters:  02/08/17 201 lb (91.2 kg)  01/16/17 206 lb (93.4 kg)  01/10/17 204 lb (92.5 kg)   Constitutional: Obese, in NAD Eyes: PERRLA,  EOMI, no exophthalmos ENT: moist mucous membranes, no thyromegaly, no cervical lymphadenopathy Cardiovascular: RRR, No MRG Respiratory: CTA B Gastrointestinal: abdomen soft, NT, ND, BS+ Musculoskeletal: no deformities, strength intact in all 4 Skin: moist, warm, no rashes Neurological: no tremor with outstretched hands, DTR normal in all 4  ASSESSMENT: 1. Elevated urinary metanephrines  - Mild - previous abdominal CT scan reports (without contrast): 04/02/2008 and 02/10/2013. Both report normal adrenals and no suspicious abdominal masses.  - MIBG scan from 07/2015: Negative   - The one thing that it's concerning is that she has a history of parathyroidectomy for primary hyperparathyroidism while very young, 36 y/o. She does not have family history of hypercalcemia, kidney stones, thyroid cancer, renal cell cancer, pheochromocytoma, to point towards an MEN2A syndrome, but this can happen spontaneously.   2. Hypercalcemia - History of right parathyroidectomy for primary hyperparathyroidism  3. PCOS  PLAN:  1. Mildly elevated urinary metanephrines   - Patient with history of hypertension since she was 36 years old complicated by preeclampsia with her last pregnancy.   -  she had mildly elevated urinary metanephrines (initially on BuSpar, and then he went off the medicine) but a normal MIBG scan, negative for pheochromocytoma or paraganglioma. This was very  reassuring, and we decided to skip checking her urine tests at last visit. I would have liked to repeat them now, however, she is doing very well on BuSpar, solid nodule on to take her off for now. She absolutely agrees with this since she feels that her anxieties finally improved. We'll repeat the tests when she comes back in a year, if off BuSpar.   2. Hypercalcemia - Please see above Patient has an interesting history of primary hyperparathyroidism when she was 13 and she is now status post right parathyroidectomy a right thyroidectomy   - In 01/12/2017, an ionized calcium was normal at 1.19 - will check a Ca, PTH, vitamin D today  3. PCOS - Patient continues on metformin. Not on OCPs.  -  She is still considering sleeve gastrectomy bolus told that she may not qualify for the surgery due to her BMI. I advised her to call the insurance lost them about the threshold. - will check an HbA1c today   Component     Latest Ref Rng & Units 02/08/2017          Sodium     135 - 146 mmol/L 142  Potassium     3.5 - 5.3 mmol/L 3.7  Chloride     98 - 110 mmol/L 105  CO2     20 - 31 mmol/L 28  Glucose     65 - 99 mg/dL 94  BUN     7 - 25 mg/dL 10  Creatinine     0.50 - 1.10 mg/dL 0.76  Calcium     8.6 - 10.2 mg/dL 9.7  GFR, Est African American     >=60 mL/min >89  GFR, Est Non African American     >=60 mL/min >89  PTH     15 - 65 pg/mL 48  VITD     30.00 - 100.00 ng/mL 34.57  Hemoglobin A1C     4.6 - 6.5 % 6.0   All labs normal.   Philemon Kingdom, MD PhD [facility]

## 2017-02-09 ENCOUNTER — Telehealth: Payer: Self-pay

## 2017-02-09 LAB — VITAMIN D 25 HYDROXY (VIT D DEFICIENCY, FRACTURES): VITD: 34.57 ng/mL (ref 30.00–100.00)

## 2017-02-09 LAB — HEMOGLOBIN A1C: Hgb A1c MFr Bld: 6 % (ref 4.6–6.5)

## 2017-02-09 LAB — PARATHYROID HORMONE, INTACT (NO CA): PTH: 48 pg/mL (ref 15–65)

## 2017-02-09 NOTE — Telephone Encounter (Signed)
Called patient and gave lab results. Patient had no questions or concerns.  

## 2017-02-09 NOTE — Telephone Encounter (Signed)
-----   Message from Philemon Kingdom, MD sent at 02/09/2017 12:47 PM EDT ----- Debbie Walter, can you please call pt: All labs are normal!

## 2017-02-16 DIAGNOSIS — I10 Essential (primary) hypertension: Secondary | ICD-10-CM | POA: Diagnosis not present

## 2017-02-16 DIAGNOSIS — Z6839 Body mass index (BMI) 39.0-39.9, adult: Secondary | ICD-10-CM | POA: Diagnosis not present

## 2017-02-16 DIAGNOSIS — R0602 Shortness of breath: Secondary | ICD-10-CM | POA: Diagnosis not present

## 2017-03-05 ENCOUNTER — Telehealth: Payer: Self-pay | Admitting: Internal Medicine

## 2017-03-05 NOTE — Telephone Encounter (Signed)
[facility] faxed leave of absence paperwork Spoke with pt she wanted intermittent leave  Starting today 03/05/17 and she would like this for a year until she can get FMLA paperwork Paperwork in [place]' in box for review and signature

## 2017-03-06 DIAGNOSIS — Z7689 Persons encountering health services in other specified circumstances: Secondary | ICD-10-CM

## 2017-03-06 NOTE — Telephone Encounter (Signed)
Done, given back to robin 

## 2017-03-07 NOTE — Telephone Encounter (Signed)
Paperwork faxed  Copy for file Copy for scan Copy for pt Copy for billing Pt aware

## 2017-03-19 ENCOUNTER — Other Ambulatory Visit: Payer: Self-pay

## 2017-03-19 MED ORDER — FLUOXETINE HCL 10 MG PO TABS: 10.0000 mg | ORAL_TABLET | Freq: Every day | ORAL | 0 refills | Status: DC

## 2017-03-23 ENCOUNTER — Encounter: Payer: Self-pay | Admitting: Family Medicine

## 2017-03-23 ENCOUNTER — Ambulatory Visit (INDEPENDENT_AMBULATORY_CARE_PROVIDER_SITE_OTHER): Payer: Federal, State, Local not specified - PPO | Admitting: Family Medicine

## 2017-03-23 VITALS — BP 132/80 | HR 67 | Temp 99.0°F | Wt 201.8 lb

## 2017-03-23 DIAGNOSIS — F411 Generalized anxiety disorder: Secondary | ICD-10-CM | POA: Diagnosis not present

## 2017-03-23 DIAGNOSIS — F430 Acute stress reaction: Secondary | ICD-10-CM | POA: Diagnosis not present

## 2017-03-23 DIAGNOSIS — H6980 Other specified disorders of Eustachian tube, unspecified ear: Secondary | ICD-10-CM

## 2017-03-23 MED ORDER — ALPRAZOLAM 0.25 MG PO TABS: 0.2500 mg | ORAL_TABLET | Freq: Two times a day (BID) | ORAL | 0 refills | Status: DC | PRN

## 2017-03-23 NOTE — Patient Instructions (Signed)
Gently try to pop your ears, use nasal saline and then use flonase if needed.   This should gradually get better.   Take care.  Glad to see you.
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
## 2017-03-25 DIAGNOSIS — H6980 Other specified disorders of Eustachian tube, unspecified ear: Secondary | ICD-10-CM | POA: Insufficient documentation
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
## 2017-03-25 NOTE — Assessment & Plan Note (Signed)
Refill done of benzodiazepine. Routed to PCP as FYI.

## 2017-04-25 DIAGNOSIS — Z6839 Body mass index (BMI) 39.0-39.9, adult: Secondary | ICD-10-CM | POA: Diagnosis not present

## 2017-04-25 DIAGNOSIS — I10 Essential (primary) hypertension: Secondary | ICD-10-CM | POA: Diagnosis not present

## 2017-04-25 DIAGNOSIS — R0602 Shortness of breath: Secondary | ICD-10-CM | POA: Diagnosis not present

## 2017-05-14 ENCOUNTER — Telehealth: Payer: Self-pay | Admitting: *Deleted

## 2017-05-14 MED ORDER — FLUCONAZOLE 150 MG PO TABS: 150.0000 mg | ORAL_TABLET | Freq: Once | ORAL | 0 refills | Status: AC

## 2017-05-14 NOTE — Telephone Encounter (Signed)
Nystatin swish and swallow sent to pharmacy

## 2017-05-14 NOTE — Addendum Note (Signed)
Addended by: [name] on: 05/14/2017 09:45 AM   Modules accepted: Orders

## 2017-05-14 NOTE — Telephone Encounter (Signed)
Patient called stating that she has a yeast infection. Patient stated that she is having vaginal itching. Patient stated that you have given her Diflucan previously for this. Pharmacy [facility] Patient requested a call back that this has been sent in. Patient stated that she works from home and it is okay to leave a message on her voicemail.

## 2017-05-23 DIAGNOSIS — H02055 Trichiasis without entropian left lower eyelid: Secondary | ICD-10-CM | POA: Diagnosis not present

## 2017-05-23 DIAGNOSIS — H04123 Dry eye syndrome of bilateral lacrimal glands: Secondary | ICD-10-CM | POA: Diagnosis not present

## 2017-06-13 ENCOUNTER — Ambulatory Visit (INDEPENDENT_AMBULATORY_CARE_PROVIDER_SITE_OTHER): Payer: Federal, State, Local not specified - PPO | Admitting: Adult Health

## 2017-06-13 ENCOUNTER — Encounter: Payer: Self-pay | Admitting: Adult Health

## 2017-06-13 VITALS — BP 140/94 | Temp 98.5°F | Wt 211.0 lb

## 2017-06-13 DIAGNOSIS — L02411 Cutaneous abscess of right axilla: Secondary | ICD-10-CM

## 2017-06-13 DIAGNOSIS — B49 Unspecified mycosis: Secondary | ICD-10-CM | POA: Diagnosis not present

## 2017-06-13 MED ORDER — NYSTATIN 100000 UNIT/GM EX POWD: Freq: Four times a day (QID) | CUTANEOUS | 0 refills | Status: DC

## 2017-06-13 MED ORDER — DOXYCYCLINE HYCLATE 100 MG PO CAPS: 100.0000 mg | ORAL_CAPSULE | Freq: Two times a day (BID) | ORAL | 0 refills | Status: DC

## 2017-06-13 NOTE — Progress Notes (Signed)
Subjective:    Patient ID: Debbie Walter, female    DOB: [birth year], 36 y.o.   MRN: [id number]  HPI  36 year old female who  has a past medical history of Fibroids; GERD (gastroesophageal reflux disease); Hypertension; Kidney stones (5/14); Panic attacks; and PCOS (polycystic ovarian syndrome). She is a patient of Webb Silversmith, who I am seeing today for an acute issue of a " boil" in her right axilla. She first noticed this 2 days ago. Denies any drainage. Has pain with arm movement and palpation.    Review of Systems See HPI   Past Medical History:  Diagnosis Date  . Fibroids   . GERD (gastroesophageal reflux disease)    takes protonix  . Hypertension   . Kidney stones 5/14  . Panic attacks   . PCOS (polycystic ovarian syndrome)    takes metformin for this    Social History   Social History  . Marital status: Married    Spouse name: N/A  . Number of children: N/A  . Years of education: N/A   Occupational History  . Not on file.   Social History Main Topics  . Smoking status: Never Smoker  . Smokeless tobacco: Never Used  . Alcohol use No  . Drug use: No  . Sexual activity: Yes    Birth control/ protection: None   Other Topics Concern  . Not on file   Social History Narrative  . No narrative on file    Past Surgical History:  Procedure Laterality Date  . CESAREAN SECTION    . CESAREAN SECTION WITH BILATERAL TUBAL LIGATION Bilateral 05/15/2014   Procedure: CESAREAN SECTION WITH BILATERAL TUBAL LIGATION;  Surgeon: Cyril Mourning, MD;  Location: [facility];  Service: Obstetrics;  Laterality: Bilateral;  repeat  edc 06/10/14  . HYSTEROSCOPY    . PARATHYROIDECTOMY    . PARATHYROIDECTOMY      Family History  Problem Relation Age of Onset  . Hyperlipidemia Mother   . Heart disease Mother   . Diabetes Mother   . Hypertension Mother   . Hyperlipidemia Father   . Hypertension Father   . Cancer Father        rectal  . Hypertension Sister   . Anesthesia  problems Neg Hx   . Hypotension Neg Hx   . Malignant hyperthermia Neg Hx   . Pseudochol deficiency Neg Hx     Allergies  Allergen Reactions  . Procardia [Nifedipine] Other (See Comments)    headache  . Wellbutrin [Bupropion] Other (See Comments)    "Makes my blood pressure go up - I don't ever want to take it again"  . Labetalol Other (See Comments)    "makes me feel like things are crawling through my head"  . Zithromax [Azithromycin] Other (See Comments)    Gi upset.  Diarrhea    Current Outpatient Prescriptions on File Prior to Visit  Medication Sig Dispense Refill  . ALPRAZolam (XANAX) 0.25 MG tablet Take 1 tablet (0.25 mg total) by mouth 2 (two) times daily as needed. 60 tablet 0  . amLODipine (NORVASC) 5 MG tablet Take 5 mg by mouth daily.    Marland Kitchen aspirin EC 81 MG tablet Take 81 mg by mouth daily.    . busPIRone (BUSPAR) 5 MG tablet Take 1 tablet (5 mg total) by mouth daily. 90 tablet 3  . cholecalciferol (VITAMIN D) 1000 UNITS tablet Take 1,000 Units by mouth daily.     . cyanocobalamin 500 MCG tablet  Take 1 tablet (500 mcg total) by mouth daily. (Patient taking differently: Take 500 mcg by mouth every other day. )    . FLUoxetine (PROZAC) 10 MG tablet Take 1 tablet (10 mg total) by mouth daily. 90 tablet 0  . ibuprofen (ADVIL,MOTRIN) 200 MG tablet Take 400 mg by mouth every 6 (six) hours as needed.    Marland Kitchen KLOR-CON M20 20 MEQ tablet 2 tablets in morning, 1 tablet in evening daily 270 tablet 1  . LOTEMAX 0.5 % GEL 1 drop 3 (three) times daily.     . metFORMIN (GLUCOPHAGE) 500 MG tablet Take 1 tablet (500 mg total) by mouth 2 (two) times daily with a meal. 180 tablet 3  . pantoprazole (PROTONIX) 40 MG tablet TAKE 1 TABLET BY MOUTH EVERY DAY 90 tablet 1  . Prenat w/o A-FeCbGl-DSS-FA-DHA (CITRANATAL ASSURE) 35-1 & 300 MG tablet Take 1 tablet by mouth daily.  11  . propranolol ER (INDERAL LA) 160 MG SR capsule Take 160 mg by mouth daily.  0  . simvastatin (ZOCOR) 40 MG tablet Take 20  mg by mouth at bedtime.  5  . valsartan-hydrochlorothiazide (DIOVAN-HCT) 160-12.5 MG tablet Take 2 tablets by mouth every morning.   1  . nitroGLYCERIN (NITROSTAT) 0.4 MG SL tablet DISSOLVE 1 TABLET UNDER THE TONUGE EVERY MINUTE AS NEEDED FOR CHEST PAIN  1  . pseudoephedrine-acetaminophen (TYLENOL SINUS) 30-500 MG TABS tablet Take 1 tablet by mouth every 4 (four) hours as needed.     No current facility-administered medications on file prior to visit.     BP (!) 140/94 (BP Location: Left Arm)   Temp 98.5 F (36.9 C) (Oral)   Wt 211 lb (95.7 kg)   BMI 39.22 kg/m       Objective:   Physical Exam  Constitutional: She is oriented to person, place, and time. She appears well-developed and well-nourished. No distress.  Cardiovascular: Normal rate, regular rhythm, normal heart sounds and intact distal pulses.  Exam reveals no gallop and no friction rub.   No murmur heard. Pulmonary/Chest: Effort normal and breath sounds normal. No respiratory distress. She has no wheezes. She has no rales. She exhibits no tenderness.  Neurological: She is alert and oriented to person, place, and time.  Skin: Skin is warm. No rash noted. She is not diaphoretic. There is erythema. No pallor.  Quarter sized non fluctuant abscess noted in right axilla. No active drainage noted   Fungal infection also noticed   Vitals reviewed.      Assessment & Plan:  1. Cutaneous abscess of right axilla - Not able to I&D at this time. Will try and resolve with doxycycline therapy.  - doxycycline (VIBRAMYCIN) 100 MG capsule; Take 1 capsule (100 mg total) by mouth 2 (two) times daily.  Dispense: 20 capsule; Refill: 0 - Advised warm compresses and dry area completely after  - Follow up if abscess comes to a head  2. Fungal infection -  nystatin (NYSTATIN) powder; Apply topically 4 (four) times daily.  Dispense: 15 g; Refill: 0   Dorothyann Peng, NP

## 2017-06-20 ENCOUNTER — Telehealth: Payer: Self-pay | Admitting: Internal Medicine

## 2017-06-20 ENCOUNTER — Other Ambulatory Visit: Payer: Self-pay | Admitting: Adult Health

## 2017-06-20 DIAGNOSIS — B49 Unspecified mycosis: Secondary | ICD-10-CM

## 2017-06-20 MED ORDER — FLUCONAZOLE 150 MG PO TABS: 150.0000 mg | ORAL_TABLET | Freq: Once | ORAL | 0 refills | Status: AC

## 2017-06-20 MED ORDER — NYSTATIN 100000 UNIT/GM EX POWD: Freq: Four times a day (QID) | CUTANEOUS | 0 refills | Status: DC

## 2017-06-20 NOTE — Telephone Encounter (Signed)
Diflucan called in for yeast infection   Larger bottle of Nystatin called in   Please follow up with PCP if no improvement

## 2017-06-20 NOTE — Telephone Encounter (Signed)
Pt saw cory on 06-13-17 and was given abx now has yeast infection. [facility] [address] [address]. Pt also would like a bigger size bottle of nystatin powder

## 2017-06-20 NOTE — Telephone Encounter (Signed)
Pt notified.  Instructed her to follow up with PCP if problem persists. Pt agreed.

## 2017-07-04 ENCOUNTER — Other Ambulatory Visit: Payer: Self-pay | Admitting: Internal Medicine

## 2017-07-10 ENCOUNTER — Other Ambulatory Visit: Payer: Self-pay | Admitting: Internal Medicine

## 2017-07-10 DIAGNOSIS — I10 Essential (primary) hypertension: Secondary | ICD-10-CM | POA: Diagnosis not present

## 2017-07-10 DIAGNOSIS — Z6839 Body mass index (BMI) 39.0-39.9, adult: Secondary | ICD-10-CM | POA: Diagnosis not present

## 2017-07-12 ENCOUNTER — Telehealth: Payer: Self-pay | Admitting: Internal Medicine

## 2017-07-12 MED ORDER — FLUOXETINE HCL 10 MG PO TABS: 10.0000 mg | ORAL_TABLET | Freq: Every day | ORAL | 0 refills | Status: DC

## 2017-07-12 NOTE — Telephone Encounter (Signed)
Spoke with pt. Requested to have refill on Fluoxetine 10 mg tablets; stated the pharmacy has sent a request through x 2, and she is almost out of it.  Reviewed chart.  Advised will send refill through to pharmacy.  Pt. Agreed.

## 2017-07-12 NOTE — Telephone Encounter (Signed)
Copied from [place] #[id number]. Topic: General - Other >> Jul 12, 2017 12:10 PM Pricilla Handler wrote: Reason for CRM: Patient wants a refill for her current medication. Patient states that her pharmacy has sent a refill request for the last two days. Patients states that she only has two pills left.Marland KitchenMarland Kitchen

## 2017-08-02 ENCOUNTER — Telehealth: Payer: Self-pay | Admitting: Adult Health

## 2017-08-02 NOTE — Telephone Encounter (Signed)
I spoke with pt and pt said she started with pain under rt arm last night but the knot under the right arm has not gone away since 05/2017; the area under arm is red but does not appear to have a head on bump but the knot is palatable. Pt does not want to schedule appt since the area cannot be I&D; pt said looks the same as when seen in 05/2017. I advised pt needed to be seen but pt wants note sent to C Nafziger NP first. Pt request cb . [facility] [address].

## 2017-08-02 NOTE — Telephone Encounter (Signed)
Copied from [place] [phone]. Topic: Quick Communication - See Telephone Encounter >> Aug 02, 2017  2:29 PM Ether Griffins B wrote: CRM for notification. See Telephone encounter for:  Pt seen by PA Nafziger for a boil under right arm. Treated by antibiotics. Pt states the last one popped on its on. Pt has another boil that has come up under the same arm same spot as last time and would like to know if the same antibiotic can be called in for her.  08/02/17.

## 2017-08-03 NOTE — Telephone Encounter (Signed)
Spoke to the pt and informed her that she will need to contact her PCP for appointment/antibiotic.  Tommi Rumps does not prescribe antibiotics over the phone.  She would need to be evaluated in office to receive antibiotic from [facility].  Pt to contact PCP office.

## 2017-08-03 NOTE — Telephone Encounter (Signed)
Patient calling back and stated that the office called her that and that she would need to come in for an appt to be seen for her boil. Patient is at work today and unable to leave and would like an antibiotic before the weekend and doesn't understand why this couldn't been handled yesterday when she first called. She is upset because she can't come in for an appt and wants something so that the place doesn't get any worse. [facility] [address]. Please give pt a call.

## 2017-08-03 NOTE — Telephone Encounter (Signed)
Pt stated that boil is better today than yesterday but she can still feel a "knot". She says that there is a hair in the middle of it and could she pull it our. Advised to pt to do warm compresses as per OV 06/13/17. She wanted to know if she needs another round of ABX

## 2017-08-06 ENCOUNTER — Encounter: Payer: Self-pay | Admitting: Internal Medicine

## 2017-08-06 ENCOUNTER — Ambulatory Visit: Payer: Federal, State, Local not specified - PPO | Admitting: Internal Medicine

## 2017-08-06 ENCOUNTER — Telehealth: Payer: Self-pay

## 2017-08-06 VITALS — BP 124/82 | HR 76 | Temp 98.7°F | Wt 216.0 lb

## 2017-08-06 DIAGNOSIS — L02419 Cutaneous abscess of limb, unspecified: Secondary | ICD-10-CM

## 2017-08-06 MED ORDER — SULFAMETHOXAZOLE-TRIMETHOPRIM 800-160 MG PO TABS: 1.0000 | ORAL_TABLET | Freq: Two times a day (BID) | ORAL | 0 refills | Status: DC

## 2017-08-06 NOTE — Telephone Encounter (Signed)
PLEASE NOTE: All timestamps contained within this report are represented as [place] Standard Time. CONFIDENTIALTY NOTICE: This fax transmission is intended only for the addressee. It contains information that is legally privileged, confidential or otherwise protected from use or disclosure. If you are not the intended recipient, you are strictly prohibited from reviewing, disclosing, copying using or disseminating any of this information or taking any action in reliance on or regarding this information. If you have received this fax in error, please notify us immediately by telephone so that we can arrange for its return to [place]. Phone: [phone], Toll-Free: [phone], Fax: [phone] Page: 1 of 2 Call Id: [id number] [place] Patient Name: Debbie Walter Gender: Female DOB: Sep 08, 1981 Age: 36 Y 2 M 24 D Return Phone Number: [phone] (Secondary) Address: City/State/Zip: [place] Client [facility] [facility] - Client Client Site [place] Physician Webb Silversmith - NP Contact Type Call Who Is Calling Patient / Member / Family / Caregiver Call Type Triage / Clinical Relationship To Patient Self Return Phone Number [phone] (Secondary) Chief Complaint Wound Infection Reason for Call Symptomatic / Request for Health Information Initial Comment Caller states boyle popped for the second time, what can do for it until tomorrow. She is in [facility], won't give me zip code Translation No Nurse Assessment Nurse: Ronnald Ramp, RN, Olivia Mackie Date/Time (Eastern Time): 08/05/2017 12:50:10 PM Confirm and document reason for call. If symptomatic, describe symptoms. ---Caller states that she has a boil developed again last Wednesday or Thursday. Was called back and told need to be seen, but didn't get seen. This morning it popped with thick blood paste white,  still a knot, smelling. It's under her right arm pit. She initially saw Georgina Snell for the first one that happened back late September of last year. It's really painful and red around the area, warm to touch. Does the patient have any new or worsening symptoms? ---Yes Will a triage be completed? ---Yes Related visit to physician within the last 2 weeks? ---No Does the PT have any chronic conditions? (i.e. diabetes, asthma, etc.) ---Yes List chronic conditions. ---High Blood Pressure. Is the patient pregnant or possibly pregnant? (Ask all females between the ages of 36-55) ---No Is this a behavioral health or substance abuse call? ---No Guidelines Guideline Title Affirmed Question Affirmed Notes Nurse Date/Time (Eastern Time) Wound Infection [1] Red area or streak AND [2] no fever Ronnald Ramp, RN, Olivia Mackie 08/05/2017 12:55:54 PM Disp. Time Eilene Ghazi Time) Disposition Final User 08/05/2017 12:25:40 PM Send To Call Back Waiting For Nurse Kathlynn Grate 08/05/2017 12:30:43 PM Attempt made - no message left Ronnald Ramp, RN, Olivia Mackie PLEASE NOTE: All timestamps contained within this report are represented as [place] Standard Time. CONFIDENTIALTY NOTICE: This fax transmission is intended only for the addressee. It contains information that is legally privileged, confidential or otherwise protected from use or disclosure. If you are not the intended recipient, you are strictly prohibited from reviewing, disclosing, copying using or disseminating any of this information or taking any action in reliance on or regarding this information. If you have received this fax in error, please notify us immediately by telephone so that we can arrange for its return to [place]. Phone: [phone], Toll-Free: [phone], Fax: [phone] Page: 2 of 2 Call Id: [id number] 08/05/2017 1:02:43 PM See Physician within 24 Hours Yes Ronnald Ramp, RN, Girtha Rm Disagree/Comply Comply Caller Understands Yes PreDisposition Call Doctor Care Advice  Given  Per Guideline SEE PHYSICIAN WITHIN 24 HOURS: * IF OFFICE WILL BE OPEN: You need to be seen within the next 24 hours. Call your doctor when the office opens, and make an appointment. CARE ADVICE per Wound Infection (Adult) guideline. ANTIBIOTIC OINTMENT: * Apply an over-the-counter antibiotic ointment (e.g., Bacitracin) 3 times a day. * If the area could become dirty, cover with a Band-Aid or a gauze dressing. * If wound is OPEN, soak in warm water or put warm wet cloth on wound for 20 min three times a day. Use a warm saline solution containing 2 tsp. table salt per quart (1 liter) of water. WARM SOAKS OR LOCAL HEAT: CALL BACK IF: * Fever occurs * You become worse. Comments User: Kathlynn Grate Date/Time Eilene Ghazi Time): 08/05/2017 12:25:00 PM CBWN- Says she just wanted to check status of call. States no one told her about a wait time. Says the boil has a smell and had some pus and blood come out. User: Gifford Shave, RN Date/Time Eilene Ghazi Time): 08/05/2017 12:31:22 PM Attempted to call back, reached voicemail - mailbox full unable to leave a message. Will attempt again in 10 minutes. Referrals REFERRED TO PCP OFFICE

## 2017-08-06 NOTE — Patient Instructions (Signed)
Skin Abscess A skin abscess is an infected area on or under your skin that contains pus and other material. An abscess can happen almost anywhere on your body. Some abscesses break open (rupture) on their own. Most continue to get worse unless they are treated. The infection can spread deeper into the body and into your blood, which can make you feel sick. Treatment usually involves draining the abscess. Follow these instructions at home: Abscess Care  If you have an abscess that has not drained, place a warm, clean, wet washcloth over the abscess several times a day. Do this as told by your doctor.  Follow instructions from your doctor about how to take care of your abscess. Make sure you: ? Cover the abscess with a bandage (dressing). ? Change your bandage or gauze as told by your doctor. ? Wash your hands with soap and water before you change the bandage or gauze. If you cannot use soap and water, use hand sanitizer.  Check your abscess every day for signs that the infection is getting worse. Check for: ? More redness, swelling, or pain. ? More fluid or blood. ? Warmth. ? More pus or a bad smell. Medicines   Take over-the-counter and prescription medicines only as told by your doctor.  If you were prescribed an antibiotic medicine, take it as told by your doctor. Do not stop taking the antibiotic even if you start to feel better. General instructions  To avoid spreading the infection: ? Do not share personal care items, towels, or hot tubs with others. ? Avoid making skin-to-skin contact with other people.  Keep all follow-up visits as told by your doctor. This is important. Contact a doctor if:  You have more redness, swelling, or pain around your abscess.  You have more fluid or blood coming from your abscess.  Your abscess feels warm when you touch it.  You have more pus or a bad smell coming from your abscess.  You have a fever.  Your muscles ache.  You have  chills.  You feel sick. Get help right away if:  You have very bad (severe) pain.  You see red streaks on your skin spreading away from the abscess. This information is not intended to replace advice given to you by your health care provider. Make sure you discuss any questions you have with your health care provider. Document Released: 02/21/2008 Document Revised: 04/30/2016 Document Reviewed: 07/14/2015 Elsevier Interactive Patient Education  2018 Elsevier Inc.  

## 2017-08-06 NOTE — Telephone Encounter (Signed)
I spoke with pt and scheduled 30' appt with Debbie Echevaria NP today at 4 pm; there is still a knot there but it is not draining now.

## 2017-08-06 NOTE — Progress Notes (Signed)
Subjective:    Patient ID: Debbie Walter, female    DOB: March 31, 1981, 36 y.o.   MRN: [id number]  HPI  Pt presents to the clinic today with c/o a knot under her right armpit. She reports this area has opened up and drained some bloody pus. She reports redness and warmth around the area. She has not been putting warm compresses on the area. She was seen for the same thing 05/2017 by Dorothyann Peng, NP and prescribed a 10 day course of Doxycyline.   Review of Systems  Past Medical History:  Diagnosis Date  . Fibroids   . GERD (gastroesophageal reflux disease)    takes protonix  . Hypertension   . Kidney stones 5/14  . Panic attacks   . PCOS (polycystic ovarian syndrome)    takes metformin for this    Current Outpatient Medications  Medication Sig Dispense Refill  . ALPRAZolam (XANAX) 0.25 MG tablet Take 1 tablet (0.25 mg total) by mouth 2 (two) times daily as needed. 60 tablet 0  . amLODipine (NORVASC) 5 MG tablet Take 5 mg by mouth daily.    Marland Kitchen aspirin EC 81 MG tablet Take 81 mg by mouth daily.    . busPIRone (BUSPAR) 5 MG tablet Take 1 tablet (5 mg total) by mouth daily. 90 tablet 3  . cholecalciferol (VITAMIN D) 1000 UNITS tablet Take 1,000 Units by mouth daily.     . cyanocobalamin 500 MCG tablet Take 1 tablet (500 mcg total) by mouth daily. (Patient taking differently: Take 500 mcg by mouth every other day. )    . doxycycline (VIBRAMYCIN) 100 MG capsule Take 1 capsule (100 mg total) by mouth 2 (two) times daily. 20 capsule 0  . FLUoxetine (PROZAC) 10 MG tablet Take 1 tablet (10 mg total) by mouth daily. 90 tablet 0  . ibuprofen (ADVIL,MOTRIN) 200 MG tablet Take 400 mg by mouth every 6 (six) hours as needed.    Marland Kitchen KLOR-CON M20 20 MEQ tablet TAKE 2 TABLETS BY MOUTH EVERY MORNING AND TAKE 1 TABLET EVERY EVENING 270 tablet 0  . LOTEMAX 0.5 % GEL 1 drop 3 (three) times daily.     . metFORMIN (GLUCOPHAGE) 500 MG tablet Take 1 tablet (500 mg total) by mouth 2 (two) times daily with a  meal. 180 tablet 3  . nitroGLYCERIN (NITROSTAT) 0.4 MG SL tablet DISSOLVE 1 TABLET UNDER THE TONUGE EVERY MINUTE AS NEEDED FOR CHEST PAIN  1  . nystatin (NYSTATIN) powder Apply topically 4 (four) times daily. 45 g 0  . pantoprazole (PROTONIX) 40 MG tablet TAKE 1 TABLET BY MOUTH EVERY DAY 90 tablet 0  . Prenat w/o A-FeCbGl-DSS-FA-DHA (CITRANATAL ASSURE) 35-1 & 300 MG tablet Take 1 tablet by mouth daily.  11  . propranolol ER (INDERAL LA) 160 MG SR capsule Take 160 mg by mouth daily.  0  . pseudoephedrine-acetaminophen (TYLENOL SINUS) 30-500 MG TABS tablet Take 1 tablet by mouth every 4 (four) hours as needed.    . simvastatin (ZOCOR) 40 MG tablet Take 20 mg by mouth at bedtime.  5  . valsartan-hydrochlorothiazide (DIOVAN-HCT) 160-12.5 MG tablet Take 2 tablets by mouth every morning.   1   No current facility-administered medications for this visit.     Allergies  Allergen Reactions  . Procardia [Nifedipine] Other (See Comments)    headache  . Wellbutrin [Bupropion] Other (See Comments)    "Makes my blood pressure go up - I don't ever want to take it again"  .  Labetalol Other (See Comments)    "makes me feel like things are crawling through my head"  . Zithromax [Azithromycin] Other (See Comments)    Gi upset.  Diarrhea    Family History  Problem Relation Age of Onset  . Hyperlipidemia Mother   . Heart disease Mother   . Diabetes Mother   . Hypertension Mother   . Hyperlipidemia Father   . Hypertension Father   . Cancer Father        rectal  . Hypertension Sister   . Anesthesia problems Neg Hx   . Hypotension Neg Hx   . Malignant hyperthermia Neg Hx   . Pseudochol deficiency Neg Hx     Social History   Socioeconomic History  . Marital status: Married    Spouse name: Not on file  . Number of children: Not on file  . Years of education: Not on file  . Highest education level: Not on file  Social Needs  . Financial resource strain: Not on file  . Food insecurity -  worry: Not on file  . Food insecurity - inability: Not on file  . Transportation needs - medical: Not on file  . Transportation needs - non-medical: Not on file  Occupational History  . Not on file  Tobacco Use  . Smoking status: Never Smoker  . Smokeless tobacco: Never Used  Substance and Sexual Activity  . Alcohol use: No    Alcohol/week: 0.0 oz  . Drug use: No  . Sexual activity: Yes    Birth control/protection: None  Other Topics Concern  . Not on file  Social History Narrative  . Not on file     Constitutional: Denies fever, malaise, fatigue, headache or abrupt weight changes.  Skin: Pt reports abscess of right armpit. Denies ulcercations.    No other specific complaints in a complete review of systems (except as listed in HPI above)..    Objective:   Physical Exam   BP 124/82   Pulse 76   Temp 98.7 F (37.1 C) (Oral)   Wt 216 lb (98 kg)   SpO2 98%   BMI 40.15 kg/m  Wt Readings from Last 3 Encounters:  08/06/17 216 lb (98 kg)  06/13/17 211 lb (95.7 kg)  03/23/17 201 lb 12 oz (91.5 kg)    General: Appears her stated age, obese in NAD. Skin: 1.5 cm open abscess noted of right axilla. Open, no drainage noted.   BMET    Component Value Date/Time   NA 142 02/08/2017 1705   K 3.7 02/08/2017 1705   CL 105 02/08/2017 1705   CO2 28 02/08/2017 1705   GLUCOSE 94 02/08/2017 1705   BUN 10 02/08/2017 1705   CREATININE 0.76 02/08/2017 1705   CALCIUM 9.7 02/08/2017 1705   GFRNONAA >89 02/08/2017 1705   GFRAA >89 02/08/2017 1705    Lipid Panel     Component Value Date/Time   CHOL 120 09/01/2016 1504   TRIG 112.0 09/01/2016 1504   HDL 40.50 09/01/2016 1504   CHOLHDL 3 09/01/2016 1504   VLDL 22.4 09/01/2016 1504   LDLCALC 57 09/01/2016 1504    CBC    Component Value Date/Time   WBC 9.5 01/12/2017 0907   RBC 5.32 (H) 01/12/2017 0907   HGB 15.0 01/12/2017 0920   HCT 44.0 01/12/2017 0920   PLT 372 01/12/2017 0907   MCV 79.5 01/12/2017 0907   MCH  26.7 01/12/2017 0907   MCHC 33.6 01/12/2017 0907   RDW 14.3 01/12/2017  0907   LYMPHSABS 1.8 01/12/2017 0907   MONOABS 0.4 01/12/2017 0907   EOSABS 0.1 01/12/2017 0907   BASOSABS 0.0 01/12/2017 0907    Hgb A1C Lab Results  Component Value Date   HGBA1C 6.0 02/08/2017           Assessment & Plan:   Armpit Abscess:  Open and draining. No need for an I&D Start using the warm compresses to encourage it to drain eRx for Septra BID x 10 days  Return precautions discussed Webb Silversmith, NP

## 2017-08-15 DIAGNOSIS — K080 Exfoliation of teeth due to systemic causes: Secondary | ICD-10-CM | POA: Diagnosis not present

## 2017-08-22 DIAGNOSIS — H04123 Dry eye syndrome of bilateral lacrimal glands: Secondary | ICD-10-CM | POA: Diagnosis not present

## 2017-08-29 ENCOUNTER — Ambulatory Visit: Payer: Federal, State, Local not specified - PPO | Admitting: Family Medicine

## 2017-08-29 ENCOUNTER — Ambulatory Visit: Payer: Self-pay | Admitting: *Deleted

## 2017-08-29 ENCOUNTER — Encounter: Payer: Self-pay | Admitting: Family Medicine

## 2017-08-29 VITALS — BP 118/74 | HR 86 | Temp 99.0°F | Wt 211.0 lb

## 2017-08-29 DIAGNOSIS — J029 Acute pharyngitis, unspecified: Secondary | ICD-10-CM | POA: Diagnosis not present

## 2017-08-29 DIAGNOSIS — J020 Streptococcal pharyngitis: Secondary | ICD-10-CM | POA: Diagnosis not present

## 2017-08-29 LAB — POCT RAPID STREP A (OFFICE): Rapid Strep A Screen: POSITIVE — AB

## 2017-08-29 MED ORDER — AMOXICILLIN 500 MG PO CAPS: 500.0000 mg | ORAL_CAPSULE | Freq: Three times a day (TID) | ORAL | 0 refills | Status: DC

## 2017-08-29 NOTE — Assessment & Plan Note (Signed)
ST with nausea and fever  RST pos  tx with amoxicillin 500 mg tid for 10 d  Disc avoiding contacts/contagious   Disc symptomatic care - see instructions on AVS  Update if not starting to improve in a week or if worsening

## 2017-08-29 NOTE — Telephone Encounter (Signed)
Scratchy throat on Saturday night and by Sunday night thought she was coming down with the flu; having generalized aches; it has since subsided since she has been out of work; now she feels like her glands are swollen and her ears were aching; pt states her throat is raw; also on Sunday night she was restless, and having dry heaves and headache on Monday; this lasted about 24 hours; she also says she has really bad gas; pt requested an appointment anytime today between 1300-1800; pt offered and accepted 1600 appointment with Dr Glori Bickers; pt verbalizes understanding; will route to [facility] pool for notification of this upcoming appointment.   Reason for Disposition . SEVERE (e.g., excruciating) throat pain  Answer Assessment - Initial Assessment Questions 1. ONSET: "When did the throat start hurting?" (Hours or days ago)      Sat 08/23/17 2. SEVERITY: "How bad is the sore throat?" (Scale 1-10; mild, moderate or severe)   - MILD (1-3):  doesn't interfere with eating or normal activities   - MODERATE (4-7): interferes with eating some solids and normal activities   - SEVERE (8-10):  excruciating pain, interferes with most normal activities   - SEVERE DYSPHAGIA: can't swallow liquids, drooling     severe 3. STREP EXPOSURE: "Has there been any exposure to strep within the past week?" If so, ask: "What type of contact occurred?"      unsure 4.  VIRAL SYMPTOMS: "Are there any symptoms of a cold, such as a runny nose, cough, hoarse voice or red eyes?"      Drainage constantly going down throat, cough; "tries to hock up phglem but she can't because her throat hurts so much" 5. FEVER: "Do you have a fever?" If so, ask: "What is your temperature, how was it measured, and when did it start?"     Yes, felt warm on Monday, and has had intermittent chills 6. PUS ON THE TONSILS: "Is there pus on the tonsils in the back of your throat?"     Unable to do; throat hurts bad when she opens her mouth 7. OTHER  SYMPTOMS: "Do you have any other symptoms?" (e.g., difficulty breathing, headache, rash)     Both ears itch and glands feel swollen 8. PREGNANCY: "Is there any chance you are pregnant?" "When was your last menstrual period?"     No LMP November 2018  Protocols used: SORE THROAT-A-AH

## 2017-08-29 NOTE — Patient Instructions (Addendum)
You have strep throat  Drink lots of fluids and rest  Try to get some rest  Acetaminophen for fever and achiness and sore throat  Gargle with salt water   Take the amoxicillin as directed for 10 days   Update if not starting to improve in a week or if worsening

## 2017-08-29 NOTE — Progress Notes (Signed)
   Subjective:    Patient ID: Debbie Walter, female    DOB: [birth year], 36 y.o.   MRN: [id number]  HPI Here for ST, with ear pain , nausea and fever   No exposures that she knows of   Throat has been hurting since Saturday night  Hurts to swallow  Nausea-has dry heaved (worse Monday_  Elevated temp  No rash   Works from home    Temp: 99 F (37.2 C)  Results for orders placed or performed in visit on 08/29/17  Rapid Strep A  Result Value Ref Range   Rapid Strep A Screen Positive (A) Negative     Review of Systems  Constitutional: Positive for fatigue and fever. Negative for activity change, appetite change and unexpected weight change.  HENT: Positive for ear pain and sore throat. Negative for congestion, postnasal drip, rhinorrhea, sinus pressure, sinus pain and trouble swallowing.   Eyes: Negative for pain, redness, itching and visual disturbance.  Respiratory: Positive for cough. Negative for chest tightness, shortness of breath and wheezing.   Cardiovascular: Negative for chest pain and palpitations.  Gastrointestinal: Negative for abdominal pain, blood in stool, constipation, diarrhea and nausea.  Endocrine: Negative for cold intolerance, heat intolerance, polydipsia and polyuria.  Genitourinary: Negative for difficulty urinating, dysuria, frequency and urgency.  Musculoskeletal: Negative for arthralgias, joint swelling and myalgias.  Skin: Negative for pallor and rash.  Neurological: Negative for dizziness, tremors, weakness, numbness and headaches.  Hematological: Negative for adenopathy. Does not bruise/bleed easily.  Psychiatric/Behavioral: Negative for decreased concentration and dysphoric mood. The patient is not nervous/anxious.        Objective:   Physical Exam  HENT:  Head: Normocephalic and atraumatic.  Right Ear: External ear normal.  Left Ear: External ear normal.  Nose: Nose normal.  Mouth/Throat: No oropharyngeal exudate.  Throat- erythema  posteriorly  No ulcers or exudate or swelling   Eyes: Conjunctivae and EOM are normal. Pupils are equal, round, and reactive to light. Right eye exhibits no discharge. Left eye exhibits no discharge.  Neck: Normal range of motion. Neck supple.  LN are not enlarged but pt is mildly tender in neck  Cardiovascular: Normal rate, regular rhythm and normal heart sounds.  Pulmonary/Chest: Effort normal and breath sounds normal. No respiratory distress. She has no wheezes. She has no rales.  Lymphadenopathy:    She has no cervical adenopathy.  Neurological: She is alert. No cranial nerve deficit.  Skin: Skin is warm and dry. No rash noted. No erythema.  Psychiatric: She has a normal mood and affect.          Assessment & Plan:   Problem List Items Addressed This Visit      Respiratory   Strep pharyngitis    ST with nausea and fever  RST pos  tx with amoxicillin 500 mg tid for 10 d  Disc avoiding contacts/contagious   Disc symptomatic care - see instructions on AVS  Update if not starting to improve in a week or if worsening         Other Visit Diagnoses    Sore throat    -  Primary   Relevant Orders   Rapid Strep A (Completed)

## 2017-08-30 ENCOUNTER — Telehealth: Payer: Self-pay | Admitting: Internal Medicine

## 2017-08-30 ENCOUNTER — Ambulatory Visit: Payer: Self-pay | Admitting: *Deleted

## 2017-08-30 NOTE — Telephone Encounter (Signed)
Tried calling pt voice mail full. Please let pt know her appointment 12/19 has been to 12/20 @ 3:45

## 2017-08-30 NOTE — Telephone Encounter (Signed)
Was dx with strep throat yesterday.   Last night started with a dry cough that got bad when she laid down.   It was difficult for her to sleep. Home Care advice given with instructions to call us back if her symptoms became worse or did not resolve within a few days.  I also advised her to check with the pharmacist at the [facility] where [facility] is going to get cough medicine for the best medication for her with the hypertension issue and having a dry cough.   She verbalized understanding of the instructions.  Reason for Disposition . Cough  Answer Assessment - Initial Assessment Questions 1. ONSET: "When did the cough begin?"      It started last night when you laid down.  I had a hard time sleeping due to the cough. 2. SEVERITY: "How bad is the cough today?"      Last night it was worse.   It's better today since I am up. 3. RESPIRATORY DISTRESS: "Describe your breathing."      No shortness of breath 4. FEVER: "Do you have a fever?" If so, ask: "What is your temperature, how was it measured, and when did it start?"     99 yesterday.   I'm still having chills and sweats now.   5. HEMOPTYSIS: "Are you coughing up any blood?" If so ask: "How much?" (flecks, streaks, tablespoons, etc.)     Dry cough.    6. TREATMENT: "What have you done so far to treat the cough?" (e.g., meds, fluids, humidifier)     Halls cough drops helps but only while I'm sucking on one.   7. CARDIAC HISTORY: "Do you have any history of heart disease?" (e.g., heart attack, congestive heart failure)      I have high BP.  I am on meds.  It's controlled. 8. LUNG HISTORY: "Do you have any history of lung disease?"  (e.g., pulmonary embolus, asthma, emphysema)     No of the above 9. PE RISK FACTORS: "Do you have a history of blood clots?" (or: recent major surgery, recent prolonged travel, bedridden )     No 10. OTHER SYMPTOMS: "Do you have any other symptoms? (e.g., runny nose, wheezing, chest pain)       Nose runny last night but now  stuffy. 11. PREGNANCY: "Is there any chance you are pregnant?" "When was your last menstrual period?"       No  Tubes tied. 12. TRAVEL: "Have you traveled out of the country in the last month?" (e.g., travel history, exposures)       No  Protocols used: COUGH - ACUTE NON-PRODUCTIVE-A-AH

## 2017-08-30 NOTE — Telephone Encounter (Signed)
Spoke with pt to r/s her appointment.  Pt stated she needs late Wednesday appointment before the end of the year.  Her original appointment was 12/26 that was r/s to 12/19.  Patient stated we needed to work with her on this appointment.  Threasa Beards can you call the patient. I r/s her appointment to 09/06/17 pt stated she could not come due to work

## 2017-08-30 NOTE — Telephone Encounter (Signed)
Called pt no answer and VM box is full

## 2017-09-01 ENCOUNTER — Other Ambulatory Visit: Payer: Self-pay | Admitting: Internal Medicine

## 2017-09-03 NOTE — Telephone Encounter (Signed)
Appointment 1/9

## 2017-09-04 MED ORDER — BUSPIRONE HCL 5 MG PO TABS: 5.0000 mg | ORAL_TABLET | Freq: Every day | ORAL | 0 refills | Status: DC

## 2017-09-04 NOTE — Telephone Encounter (Signed)
Ok to refill? Electronically refill request for busPIRone (BUSPAR) 15 MG tablet   Do not see where this dosage was prescribe. In the chart, it show patient been taking 5 mg for a long time.

## 2017-09-04 NOTE — Telephone Encounter (Signed)
I refilled the 5 mg tab

## 2017-09-04 NOTE — Telephone Encounter (Signed)
Why is she taking 1/3 of a 15 mg tab instead of a 5 mg tab?

## 2017-09-05 ENCOUNTER — Encounter: Payer: Federal, State, Local not specified - PPO | Admitting: Internal Medicine

## 2017-09-06 ENCOUNTER — Encounter: Payer: Federal, State, Local not specified - PPO | Admitting: Internal Medicine

## 2017-09-07 ENCOUNTER — Encounter: Payer: Federal, State, Local not specified - PPO | Admitting: Internal Medicine

## 2017-09-12 ENCOUNTER — Encounter: Payer: Federal, State, Local not specified - PPO | Admitting: Internal Medicine

## 2017-09-15 ENCOUNTER — Other Ambulatory Visit: Payer: Self-pay | Admitting: Internal Medicine

## 2017-09-19 DIAGNOSIS — L308 Other specified dermatitis: Secondary | ICD-10-CM | POA: Diagnosis not present

## 2017-09-26 ENCOUNTER — Encounter: Payer: Self-pay | Admitting: Internal Medicine

## 2017-09-26 ENCOUNTER — Ambulatory Visit (INDEPENDENT_AMBULATORY_CARE_PROVIDER_SITE_OTHER): Payer: Federal, State, Local not specified - PPO | Admitting: Internal Medicine

## 2017-09-26 VITALS — BP 120/80 | HR 64 | Temp 97.9°F | Ht 61.5 in | Wt 219.0 lb

## 2017-09-26 DIAGNOSIS — I10 Essential (primary) hypertension: Secondary | ICD-10-CM | POA: Diagnosis not present

## 2017-09-26 DIAGNOSIS — T502X5A Adverse effect of carbonic-anhydrase inhibitors, benzothiadiazides and other diuretics, initial encounter: Secondary | ICD-10-CM | POA: Diagnosis not present

## 2017-09-26 DIAGNOSIS — E282 Polycystic ovarian syndrome: Secondary | ICD-10-CM

## 2017-09-26 DIAGNOSIS — E538 Deficiency of other specified B group vitamins: Secondary | ICD-10-CM

## 2017-09-26 DIAGNOSIS — Z0000 Encounter for general adult medical examination without abnormal findings: Secondary | ICD-10-CM | POA: Diagnosis not present

## 2017-09-26 DIAGNOSIS — F329 Major depressive disorder, single episode, unspecified: Secondary | ICD-10-CM | POA: Diagnosis not present

## 2017-09-26 DIAGNOSIS — E042 Nontoxic multinodular goiter: Secondary | ICD-10-CM

## 2017-09-26 DIAGNOSIS — E876 Hypokalemia: Secondary | ICD-10-CM

## 2017-09-26 DIAGNOSIS — E785 Hyperlipidemia, unspecified: Secondary | ICD-10-CM | POA: Insufficient documentation

## 2017-09-26 DIAGNOSIS — E7800 Pure hypercholesterolemia, unspecified: Secondary | ICD-10-CM

## 2017-09-26 DIAGNOSIS — K219 Gastro-esophageal reflux disease without esophagitis: Secondary | ICD-10-CM | POA: Diagnosis not present

## 2017-09-26 DIAGNOSIS — F32A Depression, unspecified: Secondary | ICD-10-CM

## 2017-09-26 DIAGNOSIS — F419 Anxiety disorder, unspecified: Secondary | ICD-10-CM

## 2017-09-26 LAB — LIPID PANEL
CHOLESTEROL: 123 mg/dL (ref 0–200)
HDL: 38.5 mg/dL — AB (ref >39.00)
LDL Cholesterol: 50 mg/dL (ref 0–99)
NonHDL: 84.42
Total CHOL/HDL Ratio: 3
Triglycerides: 170 mg/dL — ABNORMAL HIGH (ref 0.0–149.0)
VLDL: 34 mg/dL (ref 0.0–40.0)

## 2017-09-26 LAB — POTASSIUM: Potassium: 3.5 mEq/L (ref 3.5–5.1)

## 2017-09-26 LAB — VITAMIN B12: Vitamin B-12: 796 pg/mL (ref 211–911)

## 2017-09-26 NOTE — Assessment & Plan Note (Signed)
Continue Metformin A1C from 01/2017- 6% She will continue to follow with GYN

## 2017-09-26 NOTE — Progress Notes (Signed)
Subjective:    Patient ID: Debbie Walter, female    DOB: 18-Sep-1981, 37 y.o.   MRN: [id number]  HPI  Pt presents to the clinic today for her annual exam. She is also due to follow up chronic conditions.  Anxiety and Depression: Chronic. She is taking Fluoxetine and Buspar as prescribed. She has not taken Xanax in months.  HTN: Her BP today is 120/80. She is taking Valsartan HCT, Propanolol and Amlodipine as prescribed. She is taking a potassium supplement as well.  ECG from 01/2016 reviewed.  GERD: Triggered by tomato based foods and greasy foods. She denies breakthrough on Pantoprazole.  PCOS: She is taking Metformin BID. She follows with Dr. Tressia Danas.  Multiple Thyroid Nodules: Her last thyroid ultrasound from 09/2015 reviewed. She follows with Dr. Cruzita Lederer.  HLD: Her last LDL was 57, 08/2016. She denies myalgias on Simvastatin. She does not consume a low fat diet.  [facility] Tetanus: 02/2014 Pap Smear: 06/2017, Dr. Tressia Danas Dentist: biannually     Diet: She does eat meat. She consumes more fruits than veggies. She does eat fried foods. She drinks mostly water, some soda. Exercise: None  Review of Systems      Past Medical History:  Diagnosis Date  . Fibroids   . GERD (gastroesophageal reflux disease)    takes protonix  . Hypertension   . Kidney stones 5/14  . Panic attacks   . PCOS (polycystic ovarian syndrome)    takes metformin for this    Current Outpatient Medications  Medication Sig Dispense Refill  . ALPRAZolam (XANAX) 0.25 MG tablet Take 1 tablet (0.25 mg total) by mouth 2 (two) times daily as needed. 60 tablet 0  . amLODipine (NORVASC) 5 MG tablet Take 5 mg by mouth daily.    Marland Kitchen amoxicillin (AMOXIL) 500 MG capsule Take 1 capsule (500 mg total) by mouth 3 (three) times daily. Take with food 30 capsule 0  . aspirin EC 81 MG tablet Take 81 mg by mouth daily.    . busPIRone (BUSPAR) 5 MG tablet Take 1 tablet (5 mg total) by mouth daily. 90 tablet 0  .  cholecalciferol (VITAMIN D) 1000 UNITS tablet Take 1,000 Units by mouth daily.     . cyanocobalamin 500 MCG tablet Take 1 tablet (500 mcg total) by mouth daily. (Patient taking differently: Take 500 mcg by mouth every other day. )    . FLUoxetine (PROZAC) 10 MG tablet Take 1 tablet (10 mg total) by mouth daily. 90 tablet 0  . ibuprofen (ADVIL,MOTRIN) 200 MG tablet Take 400 mg by mouth every 6 (six) hours as needed.    Marland Kitchen KLOR-CON M20 20 MEQ tablet TAKE 2 TABLETS BY MOUTH EVERY MORNING AND TAKE 1 TABLET EVERY EVENING 270 tablet 0  . LOTEMAX 0.5 % GEL 1 drop 3 (three) times daily.     . metFORMIN (GLUCOPHAGE) 500 MG tablet Take 1 tablet (500 mg total) by mouth 2 (two) times daily with a meal. 180 tablet 3  . nitroGLYCERIN (NITROSTAT) 0.4 MG SL tablet DISSOLVE 1 TABLET UNDER THE TONUGE EVERY MINUTE AS NEEDED FOR CHEST PAIN  1  . nystatin (NYSTATIN) powder Apply topically 4 (four) times daily. 45 g 0  . pantoprazole (PROTONIX) 40 MG tablet TAKE 1 TABLET BY MOUTH EVERY DAY 90 tablet 0  . Prenat w/o A-FeCbGl-DSS-FA-DHA (CITRANATAL ASSURE) 35-1 & 300 MG tablet Take 1 tablet by mouth daily.  11  . propranolol ER (INDERAL LA) 160 MG SR capsule Take  160 mg by mouth daily.  0  . pseudoephedrine-acetaminophen (TYLENOL SINUS) 30-500 MG TABS tablet Take 1 tablet by mouth every 4 (four) hours as needed.    . simvastatin (ZOCOR) 40 MG tablet Take 20 mg by mouth at bedtime.  5  . valsartan-hydrochlorothiazide (DIOVAN-HCT) 160-12.5 MG tablet Take 2 tablets by mouth every morning.   1   No current facility-administered medications for this visit.     Allergies  Allergen Reactions  . Procardia [Nifedipine] Other (See Comments)    headache  . Wellbutrin [Bupropion] Other (See Comments)    "Makes my blood pressure go up - I don't ever want to take it again"  . Labetalol Other (See Comments)    "makes me feel like things are crawling through my head"  . Zithromax [Azithromycin] Other (See Comments)    Gi upset.   Diarrhea    Family History  Problem Relation Age of Onset  . Hyperlipidemia Mother   . Heart disease Mother   . Diabetes Mother   . Hypertension Mother   . Hyperlipidemia Father   . Hypertension Father   . Cancer Father        rectal  . Hypertension Sister   . Anesthesia problems Neg Hx   . Hypotension Neg Hx   . Malignant hyperthermia Neg Hx   . Pseudochol deficiency Neg Hx     Social History   Socioeconomic History  . Marital status: Married    Spouse name: Not on file  . Number of children: Not on file  . Years of education: Not on file  . Highest education level: Not on file  Social Needs  . Financial resource strain: Not on file  . Food insecurity - worry: Not on file  . Food insecurity - inability: Not on file  . Transportation needs - medical: Not on file  . Transportation needs - non-medical: Not on file  Occupational History  . Not on file  Tobacco Use  . Smoking status: Never Smoker  . Smokeless tobacco: Never Used  Substance and Sexual Activity  . Alcohol use: No    Alcohol/week: 0.0 oz  . Drug use: No  . Sexual activity: Yes    Birth control/protection: None  Other Topics Concern  . Not on file  Social History Narrative  . Not on file     Constitutional: Pt reports weight gain. Denies fever, malaise, fatigue, headache.  HEENT: Denies eye pain, eye redness, ear pain, ringing in the ears, wax buildup, runny nose, nasal congestion, bloody nose, or sore throat. Respiratory: Denies difficulty breathing, shortness of breath, cough or sputum production.   Cardiovascular: Pt reports intermittent swelling in legs. Denies chest pain, chest tightness, palpitations or swelling in the hands.  Gastrointestinal: Denies abdominal pain, bloating, constipation, diarrhea or blood in the stool.  GU: Denies urgency, frequency, pain with urination, burning sensation, blood in urine, odor or discharge. Musculoskeletal: Denies decrease in range of motion, difficulty with  gait, muscle pain or joint pain and swelling.  Skin: Denies redness, rashes, lesions or ulcercations.  Neurological: Pt reports tingling in face (secondary to B12 deficiency). Denies dizziness, difficulty with memory, difficulty with speech or problems with balance and coordination.  Psych: Pt has a history of anxiety and depression. Denies SI/HI.  No other specific complaints in a complete review of systems (except as listed in HPI above).  Objective:   Physical Exam   BP 120/80   Pulse 64   Temp 97.9 F (36.6 C) (  Oral)   Ht 5' 1.5" (1.562 m)   Wt 219 lb (99.3 kg)   LMP 08/07/2016 Comment: irregular  SpO2 97%   BMI 40.71 kg/m  Wt Readings from Last 3 Encounters:  09/26/17 219 lb (99.3 kg)  08/29/17 211 lb (95.7 kg)  08/06/17 216 lb (98 kg)    General: Appears her stated age, obese in NAD. Skin: Warm, dry and intact.  HEENT: Head: normal shape and size; Eyes: sclera white, no icterus, conjunctiva pink, PERRLA and EOMs intact; Ears: Tm's gray and intact, normal light reflex; Throat/Mouth: Teeth present, mucosa pink and moist, no exudate, lesions or ulcerations noted.  Neck:  Neck supple, trachea midline.  Cardiovascular: Normal rate and rhythm. S1,S2 noted.  No murmur, rubs or gallops noted. No JVD or BLE edema. Pulmonary/Chest: Normal effort and positive vesicular breath sounds. No respiratory distress. No wheezes, rales or ronchi noted.  Abdomen: Soft and nontender. Normal bowel sounds. No distention or masses noted.  Musculoskeletal: Strength 5/5 BUE/BLE. No difficulty with gait.  Neurological: Alert and oriented. Cranial nerves II-XII grossly intact. Coordination normal.  Psychiatric: Mood and affect normal. Behavior is normal. Judgment and thought content normal.    BMET    Component Value Date/Time   NA 142 02/08/2017 1705   K 3.7 02/08/2017 1705   CL 105 02/08/2017 1705   CO2 28 02/08/2017 1705   GLUCOSE 94 02/08/2017 1705   BUN 10 02/08/2017 1705   CREATININE  0.76 02/08/2017 1705   CALCIUM 9.7 02/08/2017 1705   GFRNONAA >89 02/08/2017 1705   GFRAA >89 02/08/2017 1705    Lipid Panel     Component Value Date/Time   CHOL 120 09/01/2016 1504   TRIG 112.0 09/01/2016 1504   HDL 40.50 09/01/2016 1504   CHOLHDL 3 09/01/2016 1504   VLDL 22.4 09/01/2016 1504   LDLCALC 57 09/01/2016 1504    CBC    Component Value Date/Time   WBC 9.5 01/12/2017 0907   RBC 5.32 (H) 01/12/2017 0907   HGB 15.0 01/12/2017 0920   HCT 44.0 01/12/2017 0920   PLT 372 01/12/2017 0907   MCV 79.5 01/12/2017 0907   MCH 26.7 01/12/2017 0907   MCHC 33.6 01/12/2017 0907   RDW 14.3 01/12/2017 0907   LYMPHSABS 1.8 01/12/2017 0907   MONOABS 0.4 01/12/2017 0907   EOSABS 0.1 01/12/2017 0907   BASOSABS 0.0 01/12/2017 0907    Hgb A1C Lab Results  Component Value Date   HGBA1C 6.0 02/08/2017          Assessment & Plan:   Preventative Health Maintenance:  Flu shot UTD Tetanus UTD Pap smear UTD Encouraged her to consume a balanced diet and exercise regimen Advised her to see a dentist annually Will check potassium, lipid, and B12 today  RTC in 1 year, sooner if needed Webb Silversmith, NP

## 2017-09-26 NOTE — Assessment & Plan Note (Signed)
Continue to follow with Dr. Cruzita Lederer

## 2017-09-26 NOTE — Assessment & Plan Note (Signed)
Controlled on Valsartan HCT, Amlodipine and Propanolol She is concerned her HR is too low, she will continue to monitor this and follow up with me or Dr. Einar Gip Potassium level today secondary to diuretic induced hypokalemia Discussed DASH diet and exercise for weight loss

## 2017-09-26 NOTE — Assessment & Plan Note (Signed)
Discussed how weight loss could improve her reflux Continue Pantoprazole for now

## 2017-09-26 NOTE — Assessment & Plan Note (Signed)
Chronic but stable on Fluoxetine and Buspar Xanax prn Support offered today Will monitor

## 2017-09-26 NOTE — Assessment & Plan Note (Signed)
Encouraged her to consume a low fat diet  Continue Simvastatin for now Lipid panel today

## 2017-09-26 NOTE — Patient Instructions (Signed)

## 2017-09-27 ENCOUNTER — Telehealth: Payer: Self-pay | Admitting: Internal Medicine

## 2017-09-27 NOTE — Telephone Encounter (Signed)
[facility]  In [facility] in box for review and signature

## 2017-09-28 ENCOUNTER — Telehealth: Payer: Self-pay | Admitting: Internal Medicine

## 2017-09-28 ENCOUNTER — Other Ambulatory Visit: Payer: Self-pay

## 2017-09-28 MED ORDER — BUSPIRONE HCL 5 MG PO TABS: 5.0000 mg | ORAL_TABLET | Freq: Every day | ORAL | 0 refills | Status: DC

## 2017-09-28 NOTE — Telephone Encounter (Signed)
Left message letting pt know paperwork has been faxed Copy for pt Copy for file Copy for scan

## 2017-09-28 NOTE — Telephone Encounter (Signed)
Copied from [place] [phone]. Topic: Quick Communication - Rx Refill/Question >> Sep 28, 2017  9:09 AM Robina Ade, Helene Kelp D wrote: Medication: busPIRone (BUSPAR) 5 MG tablet Has the patient contacted their pharmacy? Yes (Agent: If no, request that the patient contact the pharmacy for the refill.) Preferred Pharmacy (with phone number or street name): [facility] on [address] [place], [place] [address] Agent: Please be advised that RX refills may take up to 3 business days. We ask that you follow-up with your pharmacy. Patient is completely out of her medication.

## 2017-09-28 NOTE — Telephone Encounter (Signed)
Done, given back to Robin 

## 2017-09-28 NOTE — Telephone Encounter (Signed)
Copied from [place] [id number]. Topic: Quick Communication - Rx Refill/Question >> Sep 28, 2017  9:09 AM Robina Ade, Helene Kelp D wrote: Medication: busPIRone (BUSPAR) 5 MG tablet Has the patient contacted their pharmacy? Yes (Agent: If no, request that the patient contact the pharmacy for the refill.) Preferred Pharmacy (with phone number or street name): [facility] on [address] [place], [place] [address] Agent: Please be advised that RX refills may take up to 3 business days. We ask that you follow-up with your pharmacy. Patient is completely out of her medication.

## 2017-09-30 ENCOUNTER — Other Ambulatory Visit: Payer: Self-pay | Admitting: Internal Medicine

## 2017-10-05 ENCOUNTER — Other Ambulatory Visit: Payer: Self-pay | Admitting: Internal Medicine

## 2017-10-10 DIAGNOSIS — H00025 Hordeolum internum left lower eyelid: Secondary | ICD-10-CM | POA: Diagnosis not present

## 2017-10-12 ENCOUNTER — Other Ambulatory Visit: Payer: Self-pay

## 2017-10-12 ENCOUNTER — Encounter (HOSPITAL_COMMUNITY): Payer: Self-pay

## 2017-10-12 DIAGNOSIS — R110 Nausea: Secondary | ICD-10-CM | POA: Diagnosis not present

## 2017-10-12 DIAGNOSIS — M791 Myalgia, unspecified site: Secondary | ICD-10-CM | POA: Diagnosis not present

## 2017-10-12 DIAGNOSIS — R05 Cough: Secondary | ICD-10-CM | POA: Insufficient documentation

## 2017-10-12 DIAGNOSIS — J111 Influenza due to unidentified influenza virus with other respiratory manifestations: Secondary | ICD-10-CM | POA: Diagnosis not present

## 2017-10-12 DIAGNOSIS — I10 Essential (primary) hypertension: Secondary | ICD-10-CM | POA: Insufficient documentation

## 2017-10-12 DIAGNOSIS — R5383 Other fatigue: Secondary | ICD-10-CM | POA: Diagnosis not present

## 2017-10-12 DIAGNOSIS — R69 Illness, unspecified: Secondary | ICD-10-CM | POA: Insufficient documentation

## 2017-10-12 DIAGNOSIS — Z79899 Other long term (current) drug therapy: Secondary | ICD-10-CM | POA: Insufficient documentation

## 2017-10-12 DIAGNOSIS — Z7982 Long term (current) use of aspirin: Secondary | ICD-10-CM | POA: Diagnosis not present

## 2017-10-12 DIAGNOSIS — R509 Fever, unspecified: Secondary | ICD-10-CM | POA: Diagnosis not present

## 2017-10-12 MED ORDER — ACETAMINOPHEN 325 MG PO TABS: 650.0000 mg | ORAL_TABLET | Freq: Once | ORAL | Status: DC | PRN

## 2017-10-12 NOTE — ED Triage Notes (Signed)
Pt reports that her 37 year old was dx'd with strep yesterday. Today around noon, she began experiencing generalized body aches and chills. She is also endorses nausea and cough. A&Ox4. Ambulatory.

## 2017-10-13 ENCOUNTER — Emergency Department (HOSPITAL_COMMUNITY)
Admission: EM | Admit: 2017-10-13 | Discharge: 2017-10-13 | Disposition: A | Discharge status: Home / Self Care | Payer: Federal, State, Local not specified - PPO | Attending: Emergency Medicine | Admitting: Emergency Medicine

## 2017-10-13 DIAGNOSIS — J111 Influenza due to unidentified influenza virus with other respiratory manifestations: Secondary | ICD-10-CM

## 2017-10-13 DIAGNOSIS — R69 Illness, unspecified: Secondary | ICD-10-CM

## 2017-10-13 LAB — INFLUENZA PANEL BY PCR (TYPE A & B)
INFLAPCR: POSITIVE — AB
Influenza B By PCR: NEGATIVE

## 2017-10-13 LAB — POC URINE PREG, ED: Preg Test, Ur: NEGATIVE

## 2017-10-13 LAB — RAPID STREP SCREEN (MED CTR MEBANE ONLY): Streptococcus, Group A Screen (Direct): NEGATIVE

## 2017-10-13 MED ORDER — METOCLOPRAMIDE HCL 10 MG PO TABS: 10.0000 mg | ORAL_TABLET | Freq: Three times a day (TID) | ORAL | 0 refills | Status: DC | PRN

## 2017-10-13 MED ORDER — KETOROLAC TROMETHAMINE 10 MG PO TABS: 10.0000 mg | ORAL_TABLET | Freq: Three times a day (TID) | ORAL | 0 refills | Status: DC | PRN

## 2017-10-13 MED ORDER — KETOROLAC TROMETHAMINE 60 MG/2ML IM SOLN
60.0000 mg | Freq: Once | INTRAMUSCULAR | Status: AC
  Administered 2017-10-13: 60 mg via INTRAMUSCULAR
  Filled 2017-10-13: qty 2

## 2017-10-13 NOTE — ED Notes (Signed)
Patient given a ginger ale and water

## 2017-10-13 NOTE — ED Provider Notes (Signed)
[facility] [facility] DEPT Provider Note   CSN: [id number] Arrival date & time: 10/12/17  2042   [id number]   History   Chief Complaint Chief Complaint  Patient presents with  . Generalized Body Aches  . Chills    HPI Debbie Walter is a 37 y.o. female.  The history is provided by the patient. No language interpreter was used.  Influenza  Presenting symptoms: cough, fatigue, fever, myalgias and nausea   Presenting symptoms: no sore throat and no vomiting   Cough:    Cough characteristics:  Dry   Severity:  Mild   Duration:  1 day   Timing:  Sporadic   Progression:  Unchanged   Chronicity:  New Severity:  Moderate Onset quality:  Gradual Duration:  1 day Progression:  Worsening Chronicity:  New Relieved by:  Nothing Ineffective treatments:  None tried Associated symptoms: chills and decreased appetite   Associated symptoms: no congestion and no syncope   Risk factors: sick contacts (daughter diagnosed with strep 2 days ago)      Past Medical History:  Diagnosis Date  . Fibroids   . GERD (gastroesophageal reflux disease)    takes protonix  . Hypertension   . Kidney stones 5/14  . Panic attacks   . PCOS (polycystic ovarian syndrome)    takes metformin for this    Patient Active Problem List   Diagnosis Date Noted  . HLD (hyperlipidemia) 09/26/2017  . Multiple thyroid nodules 01/17/2016  . HTN (hypertension) 02/10/2015  . PCOS (polycystic ovarian syndrome) 02/10/2015  . Anxiety and depression 02/10/2015  . GERD (gastroesophageal reflux disease) 02/10/2015  . Elevated urine levels of catecholamines 01/14/2015  . H/O hyperparathyroidism 01/14/2015    Past Surgical History:  Procedure Laterality Date  . CESAREAN SECTION    . CESAREAN SECTION WITH BILATERAL TUBAL LIGATION Bilateral 05/15/2014   Procedure: CESAREAN SECTION WITH BILATERAL TUBAL LIGATION;  Surgeon: Cyril Mourning, MD;  Location: [facility];  Service: Obstetrics;   Laterality: Bilateral;  repeat  edc 06/10/14  . HYSTEROSCOPY    . PARATHYROIDECTOMY    . PARATHYROIDECTOMY      OB History    Gravida Para Term Preterm AB Living   6 2 1 1 4 2    SAB TAB Ectopic Multiple Live Births   3   1   2        Home Medications    Prior to Admission medications   Medication Sig Start Date End Date Taking? Authorizing Provider  ALPRAZolam (XANAX) 0.25 MG tablet Take 1 tablet (0.25 mg total) by mouth 2 (two) times daily as needed. 03/23/17   Tonia Ghent, MD  amLODipine (NORVASC) 5 MG tablet Take 5 mg by mouth daily.    [provider]  aspirin EC 81 MG tablet Take 81 mg by mouth daily.    [provider]  busPIRone (BUSPAR) 5 MG tablet Take 1 tablet (5 mg total) by mouth daily. 09/28/17   [name], NP  cholecalciferol (VITAMIN D) 1000 UNITS tablet Take 1,000 Units by mouth daily.     [provider]  cyanocobalamin 500 MCG tablet Take 1 tablet (500 mcg total) by mouth daily. Patient taking differently: Take 500 mcg by mouth every other day.  03/23/16   [name], NP  fluocinonide cream (LIDEX) 8.18 % Apply 1 application topically 2 (two) times daily. 09/19/17   [provider]  FLUoxetine (PROZAC) 10 MG tablet TAKE 1 TABLET BY MOUTH EVERY DAY  10/09/17   [name], NP  ibuprofen (ADVIL,MOTRIN) 200 MG tablet Take 400 mg by mouth every 6 (six) hours as needed.    [provider]  ketorolac (TORADOL) 10 MG tablet Take 1 tablet (10 mg total) by mouth every 8 (eight) hours as needed for moderate pain or severe pain. 10/13/17   Antonietta Breach, PA-C  KLOR-CON M20 20 MEQ tablet TAKE 2 TABLETS BY MOUTH EVERY MORNING AND TAKE 1 TABLET EVERY EVENING 09/15/17   Baity, Coralie Keens, NP  LOTEMAX 0.5 % GEL 1 drop 3 (three) times daily.  09/19/16   [provider]  metFORMIN (GLUCOPHAGE) 500 MG tablet Take 1 tablet (500 mg total) by mouth 2 (two) times daily with a meal. 01/14/15   Philemon Kingdom, MD  metoCLOPramide  (REGLAN) 10 MG tablet Take 1 tablet (10 mg total) by mouth every 8 (eight) hours as needed (headache or nausea). 10/13/17   Antonietta Breach, PA-C  nitroGLYCERIN (NITROSTAT) 0.4 MG SL tablet DISSOLVE 1 TABLET UNDER THE TONUGE EVERY MINUTE AS NEEDED FOR CHEST PAIN 02/29/16   [provider]  nystatin (NYSTATIN) powder Apply topically 4 (four) times daily. 06/20/17   Nafziger, Tommi Rumps, NP  pantoprazole (PROTONIX) 40 MG tablet TAKE 1 TABLET BY MOUTH EVERY DAY 10/01/17   [name], NP  Prenat w/o A-FeCbGl-DSS-FA-DHA (CITRANATAL ASSURE) 35-1 & 300 MG tablet Take 1 tablet by mouth daily. 01/28/15   [provider]  propranolol ER (INDERAL LA) 160 MG SR capsule Take 160 mg by mouth daily. 01/18/17   [provider]  pseudoephedrine-acetaminophen (TYLENOL SINUS) 30-500 MG TABS tablet Take 1 tablet by mouth every 4 (four) hours as needed.    [provider]  simvastatin (ZOCOR) 40 MG tablet Take 20 mg by mouth at bedtime. 12/20/16   [provider]  valsartan-hydrochlorothiazide (DIOVAN-HCT) 160-12.5 MG tablet Take 2 tablets by mouth every morning.  02/11/16   [provider]    Family History Family History  Problem Relation Age of Onset  . Hyperlipidemia Mother   . Heart disease Mother   . Diabetes Mother   . Hypertension Mother   . Hyperlipidemia Father   . Hypertension Father   . Cancer Father        rectal  . Hypertension Sister   . Anesthesia problems Neg Hx   . Hypotension Neg Hx   . Malignant hyperthermia Neg Hx   . Pseudochol deficiency Neg Hx     Social History Social History   Tobacco Use  . Smoking status: Never Smoker  . Smokeless tobacco: Never Used  Substance Use Topics  . Alcohol use: No    Alcohol/week: 0.0 oz  . Drug use: No     Allergies   Procardia [nifedipine]; Wellbutrin [bupropion]; Labetalol; and Zithromax [azithromycin]   Review of Systems Review of Systems  Constitutional: Positive for chills, decreased  appetite, fatigue and fever.  HENT: Negative for congestion and sore throat.   Respiratory: Positive for cough.   Gastrointestinal: Positive for nausea. Negative for vomiting.  Musculoskeletal: Positive for myalgias.   Ten systems reviewed and are negative for acute change, except as noted in the HPI.    Physical Exam Updated Vital Signs BP (!) 131/52 (BP Location: Left Arm)   Pulse 82   Temp (!) 100.8 F (38.2 C) (Oral)   Resp 16   SpO2 99%   Physical Exam  Constitutional: She is oriented to person, place, and time. She appears well-developed and well-nourished. No distress.  HENT:  Head: Normocephalic and atraumatic.  Mild posterior oropharyngeal erythema without edema or exudates.  Uvula midline.  Patient tolerating secretions without difficulty.  Eyes: Conjunctivae and EOM are normal. No scleral icterus.  Neck: Normal range of motion.  No nuchal rigidity or meningismus  Cardiovascular: Normal rate, regular rhythm and intact distal pulses.  Pulmonary/Chest: Effort normal. No respiratory distress.  Respirations even and unlabored  Musculoskeletal: Normal range of motion.  Neurological: She is alert and oriented to person, place, and time. No cranial nerve deficit. She exhibits normal muscle tone. Coordination normal.  GCS 15. Speech is goal oriented. No cranial nerve deficits appreciated; symmetric eyebrow raise, no facial drooping, tongue midline. Patient has equal grip strength bilaterally with 5/5 strength against resistance in all major muscle groups bilaterally. Sensation to light touch intact. Patient moves extremities without ataxia. Patient ambulatory with steady gait.  Skin: Skin is warm and dry. No rash noted. She is not diaphoretic. No erythema. No pallor.  Psychiatric: She has a normal mood and affect. Her behavior is normal.  Nursing note and vitals reviewed.    ED Treatments / Results  Labs (all labs ordered are listed, but only abnormal results are  displayed) Labs Reviewed  INFLUENZA PANEL BY PCR (TYPE A & B) - Abnormal; Notable for the following components:      Result Value   Influenza A By PCR POSITIVE (*)    All other components within normal limits  RAPID STREP SCREEN (NOT AT [facility])  CULTURE, GROUP A STREP ([place])  POC URINE PREG, ED    EKG  EKG Interpretation None       Radiology No results found.  Procedures Procedures (including critical care time)  Medications Ordered in ED Medications  ketorolac (TORADOL) injection 60 mg (60 mg Intramuscular Given 10/13/17 0426)    8:04 PM Called patient to notify of positive influenza A swab. Patient verbalizes understanding of results and need to continue with rest, NSAIDs, fluids.  Initial Impression / Assessment and Plan / ED Course  I have reviewed the triage vital signs and the nursing notes.  Pertinent labs & imaging results that were available during my care of the patient were reviewed by me and considered in my medical decision making (see chart for details).     Patient with symptoms consistent with influenza.  Vitals are stable, low-grade fever.  No signs of dehydration, tolerating PO's.  Lungs are clear.  Due to patient's presentation and physical exam a chest x-ray was not ordered because likely diagnosis of flu.  Patient will be discharged with instructions to orally hydrate, rest, and use over-the-counter medications such as NSAIDs for muscle aches and Tylenol for fever. Return precautions discussed and provided. Patient discharged in stable condition with no unaddressed concerns.   Final Clinical Impressions(s) / ED Diagnoses   Final diagnoses:  Influenza-like illness    ED Discharge Orders        Ordered    ketorolac (TORADOL) 10 MG tablet  Every 8 hours PRN     10/13/17 0534    metoCLOPramide (REGLAN) 10 MG tablet  Every 8 hours PRN     10/13/17 0534       Antonietta Breach, PA-C 10/13/17 2013    Palumbo, April, MD 10/13/17 2304

## 2017-10-13 NOTE — Discharge Instructions (Signed)
We recommend continued rest and fluid hydration.  You may take Toradol as needed for persistent headaches or body aches.  Supplement this with Tylenol as needed.  If you experience nausea, take Reglan as prescribed.  Follow-up with your primary care doctor to ensure resolution of symptoms.

## 2017-10-15 LAB — CULTURE, GROUP A STREP (THRC)

## 2017-10-19 ENCOUNTER — Other Ambulatory Visit: Payer: Self-pay

## 2017-10-19 ENCOUNTER — Encounter: Payer: Self-pay | Admitting: Physician Assistant

## 2017-10-19 ENCOUNTER — Ambulatory Visit: Payer: Federal, State, Local not specified - PPO | Admitting: Physician Assistant

## 2017-10-19 ENCOUNTER — Emergency Department (HOSPITAL_COMMUNITY): Payer: Federal, State, Local not specified - PPO

## 2017-10-19 ENCOUNTER — Encounter (HOSPITAL_COMMUNITY): Payer: Self-pay | Admitting: Oncology

## 2017-10-19 ENCOUNTER — Emergency Department (HOSPITAL_COMMUNITY)
Admission: EM | Admit: 2017-10-19 | Discharge: 2017-10-20 | Disposition: A | Discharge status: Home / Self Care | Payer: Federal, State, Local not specified - PPO | Attending: Emergency Medicine | Admitting: Emergency Medicine

## 2017-10-19 VITALS — BP 150/88 | HR 93 | Temp 98.1°F | Resp 16 | Ht 61.0 in | Wt 222.2 lb

## 2017-10-19 DIAGNOSIS — Z79899 Other long term (current) drug therapy: Secondary | ICD-10-CM | POA: Insufficient documentation

## 2017-10-19 DIAGNOSIS — I10 Essential (primary) hypertension: Secondary | ICD-10-CM | POA: Diagnosis not present

## 2017-10-19 DIAGNOSIS — G43A0 Cyclical vomiting, not intractable: Secondary | ICD-10-CM

## 2017-10-19 DIAGNOSIS — R1013 Epigastric pain: Secondary | ICD-10-CM | POA: Diagnosis not present

## 2017-10-19 DIAGNOSIS — K8020 Calculus of gallbladder without cholecystitis without obstruction: Secondary | ICD-10-CM | POA: Diagnosis not present

## 2017-10-19 DIAGNOSIS — Z7984 Long term (current) use of oral hypoglycemic drugs: Secondary | ICD-10-CM | POA: Insufficient documentation

## 2017-10-19 DIAGNOSIS — E876 Hypokalemia: Secondary | ICD-10-CM | POA: Diagnosis not present

## 2017-10-19 DIAGNOSIS — R112 Nausea with vomiting, unspecified: Secondary | ICD-10-CM

## 2017-10-19 DIAGNOSIS — Z7982 Long term (current) use of aspirin: Secondary | ICD-10-CM | POA: Insufficient documentation

## 2017-10-19 LAB — COMPREHENSIVE METABOLIC PANEL
ALK PHOS: 57 U/L (ref 38–126)
ALT: 27 U/L (ref 14–54)
ANION GAP: 9 (ref 5–15)
AST: 22 U/L (ref 15–41)
Albumin: 3.9 g/dL (ref 3.5–5.0)
BUN: 14 mg/dL (ref 6–20)
CALCIUM: 8.7 mg/dL — AB (ref 8.9–10.3)
CO2: 26 mmol/L (ref 22–32)
Chloride: 103 mmol/L (ref 101–111)
Creatinine, Ser: 0.6 mg/dL (ref 0.44–1.00)
GFR calc non Af Amer: >60 mL/min (ref >60)
GLUCOSE: 101 mg/dL — AB (ref 65–99)
Potassium: 3.3 mmol/L — ABNORMAL LOW (ref 3.5–5.1)
Sodium: 138 mmol/L (ref 135–145)
Total Bilirubin: 0.5 mg/dL (ref 0.3–1.2)
Total Protein: 7.3 g/dL (ref 6.5–8.1)

## 2017-10-19 LAB — URINALYSIS, ROUTINE W REFLEX MICROSCOPIC
BILIRUBIN URINE: NEGATIVE
Glucose, UA: NEGATIVE mg/dL
Hgb urine dipstick: NEGATIVE
KETONES UR: NEGATIVE mg/dL
Leukocytes, UA: NEGATIVE
NITRITE: NEGATIVE
PH: 6 (ref 5.0–8.0)
Protein, ur: 30 mg/dL — AB
SPECIFIC GRAVITY, URINE: 1.023 (ref 1.005–1.030)

## 2017-10-19 LAB — CBC
HCT: 42.4 % (ref 36.0–46.0)
HEMOGLOBIN: 14.2 g/dL (ref 12.0–15.0)
MCH: 26.9 pg (ref 26.0–34.0)
MCHC: 33.5 g/dL (ref 30.0–36.0)
MCV: 80.3 fL (ref 78.0–100.0)
Platelets: 323 10*3/uL (ref 150–400)
RBC: 5.28 MIL/uL — ABNORMAL HIGH (ref 3.87–5.11)
RDW: 15 % (ref 11.5–15.5)
WBC: 11.9 10*3/uL — ABNORMAL HIGH (ref 4.0–10.5)

## 2017-10-19 LAB — LIPASE, BLOOD: Lipase: 25 U/L (ref 11–51)

## 2017-10-19 LAB — I-STAT BETA HCG BLOOD, ED (MC, WL, AP ONLY)

## 2017-10-19 MED ORDER — GI COCKTAIL ~~LOC~~
30.0000 mL | Freq: Once | ORAL | Status: AC
  Administered 2017-10-19: 30 mL via ORAL

## 2017-10-19 MED ORDER — ONDANSETRON 4 MG PO TBDP
4.0000 mg | ORAL_TABLET | Freq: Once | ORAL | Status: AC | PRN
  Administered 2017-10-19: 4 mg via ORAL
  Filled 2017-10-19: qty 1

## 2017-10-19 MED ORDER — AMLODIPINE BESYLATE 5 MG PO TABS
5.0000 mg | ORAL_TABLET | Freq: Once | ORAL | Status: AC
  Administered 2017-10-19: 5 mg via ORAL
  Filled 2017-10-19: qty 1

## 2017-10-19 MED ORDER — MORPHINE SULFATE (PF) 4 MG/ML IV SOLN
4.0000 mg | Freq: Once | INTRAVENOUS | Status: AC
Start: 2017-10-19 — End: 2017-10-19
  Administered 2017-10-19: 4 mg via INTRAVENOUS
  Filled 2017-10-19: qty 1

## 2017-10-19 MED ORDER — SODIUM CHLORIDE 0.9 % IV BOLUS (SEPSIS)
1000.0000 mL | Freq: Once | INTRAVENOUS | Status: AC
  Administered 2017-10-19: 1000 mL via INTRAVENOUS

## 2017-10-19 MED ORDER — FAMOTIDINE IN NACL 20-0.9 MG/50ML-% IV SOLN
20.0000 mg | Freq: Once | INTRAVENOUS | Status: AC
  Administered 2017-10-19: 20 mg via INTRAVENOUS
  Filled 2017-10-19: qty 50

## 2017-10-19 MED ORDER — ONDANSETRON 4 MG PO TBDP
4.0000 mg | ORAL_TABLET | Freq: Once | ORAL | Status: AC
Start: 2017-10-19 — End: 2017-10-19
  Administered 2017-10-19: 4 mg via ORAL

## 2017-10-19 NOTE — Progress Notes (Signed)
10/19/2017 6:55 PM   DOB: 04-12-1981 / MRN: [id number]  SUBJECTIVE:  Debbie Walter is a 37 y.o. female presenting for nausea and vomiting.  She associates epigastric pain.  Symptoms started this morning.  She feels she is getting worse.  She denies chest pain and shortness of breath.  She denies diaphoresis.  She has had a C-section and a tubal ligation.  Her last bowel movement was this morning and normal for her.  It was nonbloody.  She still has her appendix and her gallbladder.     She is allergic to procardia [nifedipine]; wellbutrin [bupropion]; labetalol; and zithromax [azithromycin].   She  has a past medical history of Fibroids, GERD (gastroesophageal reflux disease), Hypertension, Kidney stones (5/14), Panic attacks, and PCOS (polycystic ovarian syndrome).    She  reports that  has never smoked. she has never used smokeless tobacco. She reports that she does not drink alcohol or use drugs. She  reports that she currently engages in sexual activity. She reports using the following method of birth control/protection: None. The patient  has a past surgical history that includes Parathyroidectomy; Cesarean section; Parathyroidectomy; Hysteroscopy; and Cesarean section with bilateral tubal ligation (Bilateral, 05/15/2014).  Her family history includes Cancer in her father; Diabetes in her mother; Heart disease in her mother; Hyperlipidemia in her father and mother; Hypertension in her father, mother, and sister.  Review of Systems  Constitutional: Negative for chills, diaphoresis and fever.  Respiratory: Negative for cough, hemoptysis, sputum production, shortness of breath and wheezing.   Cardiovascular: Negative for chest pain, orthopnea and leg swelling.  Gastrointestinal: Negative for nausea.  Skin: Negative for rash.  Neurological: Negative for dizziness.    The problem list and medications were reviewed and updated by myself where necessary and exist elsewhere in the encounter.     OBJECTIVE:  BP (!) 150/88 (BP Location: Right Arm, Patient Position: Sitting, Cuff Size: Large)   Pulse 93   Temp 98.1 F (36.7 C) (Oral)   Resp 16   Ht 5\' 1"  (1.549 m)   Wt 222 lb 3.2 oz (100.8 kg)   LMP 08/06/2017 Comment: PCOS  SpO2 96%   BMI 41.98 kg/m   Wt Readings from Last 3 Encounters:  10/19/17 222 lb 3.2 oz (100.8 kg)  09/26/17 219 lb (99.3 kg)  08/29/17 211 lb (95.7 kg)   Temp Readings from Last 3 Encounters:  10/19/17 98.1 F (36.7 C) (Oral)  10/12/17 (!) 100.8 F (38.2 C) (Oral)  09/26/17 97.9 F (36.6 C) (Oral)   BP Readings from Last 3 Encounters:  10/19/17 (!) 150/88  10/13/17 (!) 131/52  09/26/17 120/80   Pulse Readings from Last 3 Encounters:  10/19/17 93  10/13/17 82  09/26/17 64     Physical Exam  Constitutional: She is active.  Non-toxic appearance.  Cardiovascular: Normal rate, regular rhythm, S1 normal, S2 normal, normal heart sounds and intact distal pulses. Exam reveals no gallop, no friction rub and no decreased pulses.  No murmur heard. Pulmonary/Chest: Effort normal. No tachypnea. She has no rales.  Abdominal: She exhibits no distension. There is tenderness.    Musculoskeletal: She exhibits no edema.  Neurological: She is alert.  Skin: Skin is warm and dry. She is not diaphoretic. No pallor.   Lab Results  Component Value Date   WBC 9.5 01/12/2017   HGB 15.0 01/12/2017   HCT 44.0 01/12/2017   MCV 79.5 01/12/2017   PLT 372 01/12/2017    Lab Results  Component  Value Date   CREATININE 0.76 02/08/2017   BUN 10 02/08/2017   NA 142 02/08/2017   K 3.5 09/26/2017   CL 105 02/08/2017   CO2 28 02/08/2017    Lab Results  Component Value Date   ALT 27 09/01/2016   AST 19 09/01/2016   ALKPHOS 59 09/01/2016   BILITOT 0.4 09/01/2016    Lab Results  Component Value Date   TSH 1.79 08/20/2015    Lab Results  Component Value Date   HGBA1C 6.0 02/08/2017    Lab Results  Component Value Date   CHOL 123 09/26/2017    HDL 38.50 (L) 09/26/2017   LDLCALC 50 09/26/2017   TRIG 170.0 (H) 09/26/2017   CHOLHDL 3 09/26/2017     No results found for this or any previous visit (from the past 72 hour(s)).  No results found.  ASSESSMENT AND PLAN:   Debbie Walter was seen today for abdominal pain and emesis.  Diagnoses and all orders for this visit:  Cyclical vomiting with nausea, intractability of vomiting not specified -     ondansetron (ZOFRAN-ODT) disintegrating tablet 4 mg  Acute epigastric pain: Given she was just vomiting I cannot really trust my CBC here in the office. In regardless of those results, high or low, she is still tender in the epigastrium.  I am concerned for possible pancreatitis, cholecystitis, gastric ulcer.  I think she needs a CT scan tonight.  Options discussed with patient.  She has elected to go to the ED tonight for further workup.  I have spoken to nurse to generate that [facility] and made him aware that she will be coming. -     gi cocktail (Maalox,Lidocaine,Donnatal)     The patient is advised to call or return to clinic if she does not see an improvement in symptoms, or to seek the care of the closest emergency department if she worsens with the above plan.   Philis Fendt, MHS, PA-C [facility] 10/19/2017 6:55 PM

## 2017-10-19 NOTE — ED Provider Notes (Signed)
[facility] DEPT Provider Note   CSN: [id number] Arrival date & time: 10/19/17  1900     History   Chief Complaint Chief Complaint  Patient presents with  . Abdominal Pain    HPI Debbie Walter is a 37 y.o. female with a hx of fibroids, GERD, hypertension, kidney stones, PCOS presents to the Emergency Department complaining of gradual, persistent, progressively worsening gastric abdominal pain onset 6 AM this morning.  Patient reports the pain is primarily epigastric but does radiate some into her chest.  She reports this feels similar to her previous episodes of acid reflux.  Associated symptoms include nausea and vomiting.  Emesis is nonbloody nonbilious.  She reports 5-6 episodes today.  She reports taking Zofran at home without relief of her symptoms.  No known aggravating or alleviating factors.  She reports she was evaluated at urgent care earlier today who referred her here to the emergency department for CT scan.  She also reports she was given GI cocktail at urgent care that did improve her symptoms some.  She denies fevers, chills, headache, neck pain, shortness of breath, diarrhea, weakness, dizziness, syncope, dysuria, vaginal discharge..    Record review shows that patient was seen for influenza-like illness on 10/13/2017.  Her influenza A was positive at that time.  Patient reports she was not given Tamiflu.   The history is provided by the patient and medical records. No language interpreter was used.    Past Medical History:  Diagnosis Date  . Fibroids   . GERD (gastroesophageal reflux disease)    takes protonix  . Hypertension   . Kidney stones 5/14  . Panic attacks   . PCOS (polycystic ovarian syndrome)    takes metformin for this    Patient Active Problem List   Diagnosis Date Noted  . HLD (hyperlipidemia) 09/26/2017  . Multiple thyroid nodules 01/17/2016  . HTN (hypertension) 02/10/2015  . PCOS (polycystic ovarian syndrome)  02/10/2015  . Anxiety and depression 02/10/2015  . GERD (gastroesophageal reflux disease) 02/10/2015  . Elevated urine levels of catecholamines 01/14/2015  . H/O hyperparathyroidism 01/14/2015    Past Surgical History:  Procedure Laterality Date  . CESAREAN SECTION    . CESAREAN SECTION WITH BILATERAL TUBAL LIGATION Bilateral 05/15/2014   Procedure: CESAREAN SECTION WITH BILATERAL TUBAL LIGATION;  Surgeon: Cyril Mourning, MD;  Location: [facility];  Service: Obstetrics;  Laterality: Bilateral;  repeat  edc 06/10/14  . HYSTEROSCOPY    . PARATHYROIDECTOMY    . PARATHYROIDECTOMY      OB History    Gravida Para Term Preterm AB Living   6 2 1 1 4 2    SAB TAB Ectopic Multiple Live Births   3   1   2        Home Medications    Prior to Admission medications   Medication Sig Start Date End Date Taking? Authorizing Provider  ALPRAZolam (XANAX) 0.25 MG tablet Take 1 tablet (0.25 mg total) by mouth 2 (two) times daily as needed. 03/23/17   Tonia Ghent, MD  amLODipine (NORVASC) 5 MG tablet Take 5 mg by mouth daily.    [provider]  aspirin EC 81 MG tablet Take 81 mg by mouth daily.    [provider]  busPIRone (BUSPAR) 5 MG tablet Take 1 tablet (5 mg total) by mouth daily. 09/28/17   [name], NP  cholecalciferol (VITAMIN D) 1000 UNITS tablet Take 1,000 Units by mouth daily.  [provider]  cyanocobalamin 500 MCG tablet Take 1 tablet (500 mcg total) by mouth daily. Patient taking differently: Take 500 mcg by mouth every other day.  03/23/16   [name], NP  fluocinonide cream (LIDEX) 9.03 % Apply 1 application topically 2 (two) times daily. 09/19/17   [provider]  FLUoxetine (PROZAC) 10 MG tablet TAKE 1 TABLET BY MOUTH EVERY DAY 10/09/17   [name], NP  ibuprofen (ADVIL,MOTRIN) 200 MG tablet Take 400 mg by mouth every 6 (six) hours as needed.    [provider]  ketorolac (TORADOL) 10 MG tablet Take 1 tablet (10 mg  total) by mouth every 8 (eight) hours as needed for moderate pain or severe pain. 10/13/17   Antonietta Breach, PA-C  LOTEMAX 0.5 % GEL 1 drop 3 (three) times daily.  09/19/16   [provider]  metFORMIN (GLUCOPHAGE) 500 MG tablet Take 1 tablet (500 mg total) by mouth 2 (two) times daily with a meal. 01/14/15   Philemon Kingdom, MD  metoCLOPramide (REGLAN) 10 MG tablet Take 1 tablet (10 mg total) by mouth every 8 (eight) hours as needed (headache or nausea). 10/13/17   Antonietta Breach, PA-C  nitroGLYCERIN (NITROSTAT) 0.4 MG SL tablet DISSOLVE 1 TABLET UNDER THE TONUGE EVERY MINUTE AS NEEDED FOR CHEST PAIN 02/29/16   [provider]  nystatin (NYSTATIN) powder Apply topically 4 (four) times daily. 06/20/17   Nafziger, Tommi Rumps, NP  ondansetron (ZOFRAN ODT) 8 MG disintegrating tablet 8mg  ODT q4 hours prn nausea 10/20/17   Javonte Elenes, Jarrett Soho, PA-C  oxyCODONE (ROXICODONE) 5 MG immediate release tablet Take 1 tablet (5 mg total) by mouth every 6 (six) hours as needed for severe pain. 10/20/17   Querida Beretta, Jarrett Soho, PA-C  pantoprazole (PROTONIX) 40 MG tablet TAKE 1 TABLET BY MOUTH EVERY DAY 10/01/17   [name], NP  potassium chloride SA (K-DUR,KLOR-CON) 20 MEQ tablet Take 1 tablet (20 mEq total) by mouth daily. 10/20/17   Zhamir Pirro, Jarrett Soho, PA-C  Prenat w/o A-FeCbGl-DSS-FA-DHA (CITRANATAL ASSURE) 35-1 & 300 MG tablet Take 1 tablet by mouth daily. 01/28/15   [provider]  propranolol ER (INDERAL LA) 160 MG SR capsule Take 160 mg by mouth daily. 01/18/17   [provider]  pseudoephedrine-acetaminophen (TYLENOL SINUS) 30-500 MG TABS tablet Take 1 tablet by mouth every 4 (four) hours as needed.    [provider]  simvastatin (ZOCOR) 40 MG tablet Take 20 mg by mouth at bedtime. 12/20/16   [provider]  valsartan-hydrochlorothiazide (DIOVAN-HCT) 160-12.5 MG tablet Take 2 tablets by mouth every morning.  02/11/16   [provider]    Family History Family  History  Problem Relation Age of Onset  . Hyperlipidemia Mother   . Heart disease Mother   . Diabetes Mother   . Hypertension Mother   . Hyperlipidemia Father   . Hypertension Father   . Cancer Father        rectal  . Hypertension Sister   . Anesthesia problems Neg Hx   . Hypotension Neg Hx   . Malignant hyperthermia Neg Hx   . Pseudochol deficiency Neg Hx     Social History Social History   Tobacco Use  . Smoking status: Never Smoker  . Smokeless tobacco: Never Used  Substance Use Topics  . Alcohol use: No    Alcohol/week: 0.0 oz  . Drug use: No     Allergies   Procardia [nifedipine]; Wellbutrin [bupropion]; Labetalol; and Zithromax [azithromycin]   Review  of Systems Review of Systems  Constitutional: Negative for appetite change, diaphoresis, fatigue, fever and unexpected weight change.  HENT: Negative for mouth sores.   Eyes: Negative for visual disturbance.  Respiratory: Negative for cough, chest tightness, shortness of breath and wheezing.   Cardiovascular: Negative for chest pain.  Gastrointestinal: Positive for abdominal pain, nausea and vomiting. Negative for constipation and diarrhea.  Endocrine: Negative for polydipsia, polyphagia and polyuria.  Genitourinary: Negative for dysuria, frequency, hematuria and urgency.  Musculoskeletal: Negative for back pain and neck stiffness.  Skin: Negative for rash.  Allergic/Immunologic: Negative for immunocompromised state.  Neurological: Negative for syncope, light-headedness and headaches.  Hematological: Does not bruise/bleed easily.  Psychiatric/Behavioral: Negative for sleep disturbance. The patient is not nervous/anxious.      Physical Exam Updated Vital Signs BP (!) 146/89 (BP Location: Left Arm)   Pulse 88   Temp 98.4 F (36.9 C) (Oral)   Resp 20   LMP  (LMP Unknown)   SpO2 99%   Physical Exam  Constitutional: She appears well-developed and well-nourished. No distress.  Awake, alert, nontoxic  appearance  HENT:  Head: Normocephalic and atraumatic.  Mouth/Throat: Oropharynx is clear and moist. No oropharyngeal exudate.  Eyes: Conjunctivae are normal. No scleral icterus.  Neck: Normal range of motion. Neck supple.  Cardiovascular: Normal rate, regular rhythm and intact distal pulses.  Pulmonary/Chest: Effort normal and breath sounds normal. No respiratory distress. She has no wheezes.  Equal chest expansion  Abdominal: Soft. Bowel sounds are normal. She exhibits no mass. There is tenderness in the right upper quadrant and epigastric area. There is no rigidity, no rebound, no guarding, no CVA tenderness, no tenderness at McBurney's point and negative Murphy's sign.  Exam limited by body habitus  Musculoskeletal: Normal range of motion. She exhibits no edema.  Neurological: She is alert.  Speech is clear and goal oriented Moves extremities without ataxia  Skin: Skin is warm and dry. She is not diaphoretic.  Psychiatric: She has a normal mood and affect.  Nursing note and vitals reviewed.    ED Treatments / Results  Labs (all labs ordered are listed, but only abnormal results are displayed) Labs Reviewed  COMPREHENSIVE METABOLIC PANEL - Abnormal; Notable for the following components:      Result Value   Potassium 3.3 (*)    Glucose, Bld 101 (*)    Calcium 8.7 (*)    All other components within normal limits  CBC - Abnormal; Notable for the following components:   WBC 11.9 (*)    RBC 5.28 (*)    All other components within normal limits  URINALYSIS, ROUTINE W REFLEX MICROSCOPIC - Abnormal; Notable for the following components:   Protein, ur 30 (*)    Bacteria, UA RARE (*)    Squamous Epithelial / LPF 0-5 (*)    All other components within normal limits  LIPASE, BLOOD  I-STAT BETA HCG BLOOD, ED ([facility], [facility], [facility] ONLY)     Radiology US Abdomen Limited  Result Date: 10/20/2017 CLINICAL DATA:  RIGHT upper quadrant pain. EXAM: ULTRASOUND ABDOMEN LIMITED RIGHT UPPER QUADRANT  COMPARISON:  None. FINDINGS: Gallbladder: 1.2 cm echogenic gallstone with acoustic shadowing. Small volume layering gallbladder sludge. No gallbladder wall thickening or pericholecystic fluid. No sonographic Murphy's sign elicited. Common bile duct: Diameter: 5 mm Liver: No focal lesion identified. Increased parenchymal echogenicity. Portal vein is patent on color Doppler imaging with normal direction of blood flow towards the liver. IMPRESSION: Cholelithiasis without sonographic findings of acute cholecystitis. Hepatic steatosis/hepatocellular  disease. Electronically Signed   By: Elon Alas M.D.   On: 10/20/2017 00:29    Procedures Procedures (including critical care time)  Medications Ordered in ED Medications  ondansetron (ZOFRAN-ODT) disintegrating tablet 4 mg (4 mg Oral Given 10/19/17 2033)  sodium chloride 0.9 % bolus 1,000 mL (0 mLs Intravenous Stopped 10/20/17 0030)  morphine 4 MG/ML injection 4 mg (4 mg Intravenous Given 10/19/17 2319)  famotidine (PEPCID) IVPB 20 mg premix (0 mg Intravenous Stopped 10/19/17 2356)  amLODipine (NORVASC) tablet 5 mg (5 mg Oral Given 10/19/17 2328)  oxyCODONE-acetaminophen (PERCOCET/ROXICET) 5-325 MG per tablet 2 tablet (2 tablets Oral Given 10/20/17 0212)  dicyclomine (BENTYL) injection 20 mg (20 mg Intramuscular Given 10/20/17 0221)     Initial Impression / Assessment and Plan / ED Course  I have reviewed the triage vital signs and the nursing notes.  Pertinent labs & imaging results that were available during my care of the patient were reviewed by me and considered in my medical decision making (see chart for details).  Clinical Course as of Oct 20 625  Fri Oct 19, 2017  2233 Mild leukocytosis WBC: (!) 11.9 [HM]  2233 Mild hypokalemia Potassium: (!) 3.3 [HM]  2233 No evidence of UTI  [HM]  Sat Oct 20, 2017  0208 On repeat exam, abdomen is soft and nontender.  She reports her pain is well controlled.  She has tolerated p.o. fluids without emesis.   [HM]  0210 Narcotic database accessed.  No recurrent narcotic prescriptions.  Last narcotic was 05/11/16  [HM]  0530 On repeat exam, abdomen remains soft and nontender.  Patient has tolerated p.o. without difficulty and without emesis.  [HM]  [id number] Tension and it resolved with patient's pain. BP: 127/72 [HM]    Clinical Course User Index [HM] Kadarious Dikes, Jarrett Soho, PA-C    Presents with epigastric and right upper quadrant abdominal pain with associated nausea and vomiting.  Labs are reassuring.  Mild hypokalemia.  Negative Murphy sign.  Ultrasound shows gallstones and sludge.  No evidence of cholecystitis on ultrasound.  Numerous repeat abdominal exams have improved and patient remains without guarding or Murphy sign.  Her labs are reassuring.  She is afebrile without tachycardia or hypotension.  No evidence of sepsis.  Patient has tolerated p.o. here in the emergency department without emesis.  She is to follow-up with [place] surgery early next week.   Long discussion with patient as she is nervous about discharge home stating concerns that her pain may return.  I have been clear with patient that if her pain returns, she begins vomiting again or develops a fever she is to return to the emergency department for additional evaluation and treatment.  She states understanding and is in agreement with this plan.  Final Clinical Impressions(s) / ED Diagnoses   Final diagnoses:  Gallstones  Non-intractable vomiting with nausea, unspecified vomiting type  Hypokalemia    ED Discharge Orders        Ordered    oxyCODONE (ROXICODONE) 5 MG immediate release tablet  Every 6 hours PRN     10/20/17 0211    ondansetron (ZOFRAN ODT) 8 MG disintegrating tablet     10/20/17 0211    potassium chloride SA (K-DUR,KLOR-CON) 20 MEQ tablet  Daily     10/20/17 0214       Jaque Dacy, Gwenlyn Perking 10/20/17 1610    Duffy Bruce, MD 10/20/17 1137

## 2017-10-19 NOTE — ED Notes (Signed)
Ultrasound in progress  

## 2017-10-19 NOTE — ED Triage Notes (Signed)
Pt c/o upper abdominal pain, N/V since this AM.  Pt states that the abdominal pain would get worse however if pt belched pain would subside.  Pt is A&O x 4.

## 2017-10-19 NOTE — Patient Instructions (Signed)
     IF you received an x-ray today, you will receive an invoice from [facility]. Please contact [facility] at [phone] with questions or concerns regarding your invoice.   IF you received labwork today, you will receive an invoice from [facility]. Please contact [facility] at [phone] with questions or concerns regarding your invoice.   Our billing staff will not be able to assist you with questions regarding bills from these companies.  You will be contacted with the lab results as soon as they are available. The fastest way to get your results is to activate your My Chart account. Instructions are located on the last page of this paperwork. If you have not heard from us regarding the results in 2 weeks, please contact this office.     

## 2017-10-20 ENCOUNTER — Ambulatory Visit: Payer: Self-pay | Admitting: Physician Assistant

## 2017-10-20 DIAGNOSIS — K8020 Calculus of gallbladder without cholecystitis without obstruction: Secondary | ICD-10-CM | POA: Diagnosis not present

## 2017-10-20 MED ORDER — POTASSIUM CHLORIDE CRYS ER 20 MEQ PO TBCR: 20.0000 meq | EXTENDED_RELEASE_TABLET | Freq: Every day | ORAL | 0 refills | Status: DC

## 2017-10-20 MED ORDER — DICYCLOMINE HCL 10 MG/ML IM SOLN
20.0000 mg | Freq: Once | INTRAMUSCULAR | Status: AC
  Administered 2017-10-20: 20 mg via INTRAMUSCULAR
  Filled 2017-10-20: qty 2

## 2017-10-20 MED ORDER — OXYCODONE HCL 5 MG PO TABS: 5.0000 mg | ORAL_TABLET | Freq: Four times a day (QID) | ORAL | 0 refills | Status: DC | PRN

## 2017-10-20 MED ORDER — OXYCODONE-ACETAMINOPHEN 5-325 MG PO TABS
2.0000 | ORAL_TABLET | Freq: Once | ORAL | Status: AC
  Administered 2017-10-20: 2 via ORAL
  Filled 2017-10-20: qty 2

## 2017-10-20 MED ORDER — ONDANSETRON 8 MG PO TBDP: ORAL_TABLET | ORAL | 0 refills | Status: DC

## 2017-10-20 NOTE — Discharge Instructions (Signed)
1. Medications: Zofran, Oxycodone, usual home medications 2. Treatment: rest, drink plenty of fluids, advance diet slowly 3. Follow Up: Please followup with your primary doctor in 2 days for discussion of your diagnoses and further evaluation after today's visit; if you do not have a primary care doctor use the resource guide provided to find one; Please return to the ER for persistent vomiting, high fevers or worsening symptoms

## 2017-10-24 ENCOUNTER — Other Ambulatory Visit: Payer: Self-pay | Admitting: Surgery

## 2017-10-24 DIAGNOSIS — K8020 Calculus of gallbladder without cholecystitis without obstruction: Secondary | ICD-10-CM | POA: Diagnosis not present

## 2017-10-29 NOTE — Pre-Procedure Instructions (Signed)
Debbie Walter  10/29/2017      [id number] [name], [name] - [address] [name] [name] [address] Phone: [phone] Fax: [phone]    Your procedure is scheduled on  Wednesday 10/31/17  Report to [facility] Admitting at 800 A.M.  Call this number if you have problems the morning of surgery:  [phone]   Remember:  Do not eat food or drink liquids after midnight.  Take these medicines the morning of surgery with A SIP OF WATER - PROPRANOLOL (INDERAL), PANTOPRAZOLE (PROTONIX), FLUOXETINE (PROZAC), BUSPIRONE (BUSPAR), AMLODIPINE (NORVASC), ALPRAZOLAM (XANAX)  7 days prior to surgery STOP taking any Aspirin(unless otherwise instructed by your surgeon), Aleve, Naproxen, Ibuprofen, Motrin, Advil, Goody's, BC's, all herbal medications, fish oil, and all vitamins    Do not wear jewelry, make-up or nail polish.  Do not wear lotions, powders, or perfumes, or deodorant.  Do not shave 48 hours prior to surgery.  Men may shave face and neck.  Do not bring valuables to the hospital.  [facility] is not responsible for any belongings or valuables.  Contacts, dentures or bridgework may not be worn into surgery.  Leave your suitcase in the car.  After surgery it may be brought to your room.  For patients admitted to the hospital, discharge time will be determined by your treatment team.  Patients discharged the day of surgery will not be allowed to drive home.   Name and phone number of your driver:    Special instructions:  [name] - Preparing for Surgery  Before surgery, you can play an important role.  Because skin is not sterile, your skin needs to be as free of germs as possible.  You can reduce the number of germs on you skin by washing with CHG (chlorahexidine gluconate) soap before surgery.  CHG is an antiseptic cleaner which kills germs and bonds with the skin to continue killing germs even after  washing.  Please DO NOT use if you have an allergy to CHG or antibacterial soaps.  If your skin becomes reddened/irritated stop using the CHG and inform your nurse when you arrive at Short Stay.  Do not shave (including legs and underarms) for at least 48 hours prior to the first CHG shower.  You may shave your face.  Please follow these instructions carefully:   1.  Shower with CHG Soap the night before surgery and the                                morning of Surgery.  2.  If you choose to wash your hair, wash your hair first as usual with your       normal shampoo.  3.  After you shampoo, rinse your hair and body thoroughly to remove the                      Shampoo.  4.  Use CHG as you would any other liquid soap.  You can apply chg directly       to the skin and wash gently with scrungie or a clean washcloth.  5.  Apply the CHG Soap to your body ONLY FROM THE NECK DOWN.        Do not use on open wounds or open sores.  Avoid contact with your eyes,  ears, mouth and genitals (private parts).  Wash genitals (private parts)       with your normal soap.  6.  Wash thoroughly, paying special attention to the area where your surgery        will be performed.  7.  Thoroughly rinse your body with warm water from the neck down.  8.  DO NOT shower/wash with your normal soap after using and rinsing off       the CHG Soap.  9.  Pat yourself dry with a clean towel.            10.  Wear clean pajamas.            11.  Place clean sheets on your bed the night of your first shower and do not        sleep with pets.  Day of Surgery  Do not apply any lotions/deoderants the morning of surgery.  Please wear clean clothes to the hospital/surgery center.    Please read over the following fact sheets that you were given. Pain Booklet

## 2017-10-30 ENCOUNTER — Encounter (HOSPITAL_COMMUNITY)
Admission: RE | Admit: 2017-10-30 | Discharge: 2017-10-30 | Disposition: A | Discharge status: Home / Self Care | Payer: Federal, State, Local not specified - PPO | Source: Ambulatory Visit | Attending: Orthopaedic Surgery | Admitting: Orthopaedic Surgery

## 2017-10-30 ENCOUNTER — Encounter (HOSPITAL_COMMUNITY): Payer: Self-pay

## 2017-10-30 ENCOUNTER — Other Ambulatory Visit: Payer: Self-pay

## 2017-10-30 DIAGNOSIS — I10 Essential (primary) hypertension: Secondary | ICD-10-CM | POA: Diagnosis not present

## 2017-10-30 DIAGNOSIS — F419 Anxiety disorder, unspecified: Secondary | ICD-10-CM | POA: Diagnosis not present

## 2017-10-30 DIAGNOSIS — K8010 Calculus of gallbladder with chronic cholecystitis without obstruction: Secondary | ICD-10-CM | POA: Diagnosis not present

## 2017-10-30 DIAGNOSIS — K219 Gastro-esophageal reflux disease without esophagitis: Secondary | ICD-10-CM | POA: Diagnosis not present

## 2017-10-30 DIAGNOSIS — Z6841 Body Mass Index (BMI) 40.0 and over, adult: Secondary | ICD-10-CM | POA: Diagnosis not present

## 2017-10-30 DIAGNOSIS — K828 Other specified diseases of gallbladder: Secondary | ICD-10-CM | POA: Diagnosis not present

## 2017-10-30 HISTORY — DX: Methicillin resistant Staphylococcus aureus infection, unspecified site: A49.02

## 2017-10-30 HISTORY — DX: Personal history of urinary calculi: Z87.442

## 2017-10-30 LAB — BASIC METABOLIC PANEL
ANION GAP: 11 (ref 5–15)
BUN: 5 mg/dL — ABNORMAL LOW (ref 6–20)
CALCIUM: 9.4 mg/dL (ref 8.9–10.3)
CO2: 27 mmol/L (ref 22–32)
Chloride: 103 mmol/L (ref 101–111)
Creatinine, Ser: 0.79 mg/dL (ref 0.44–1.00)
GLUCOSE: 97 mg/dL (ref 65–99)
POTASSIUM: 3.6 mmol/L (ref 3.5–5.1)
Sodium: 141 mmol/L (ref 135–145)

## 2017-10-30 LAB — CBC
HEMATOCRIT: 40.9 % (ref 36.0–46.0)
Hemoglobin: 13.3 g/dL (ref 12.0–15.0)
MCH: 26.5 pg (ref 26.0–34.0)
MCHC: 32.5 g/dL (ref 30.0–36.0)
MCV: 81.6 fL (ref 78.0–100.0)
Platelets: 376 10*3/uL (ref 150–400)
RBC: 5.01 MIL/uL (ref 3.87–5.11)
RDW: 15 % (ref 11.5–15.5)
WBC: 10.5 10*3/uL (ref 4.0–10.5)

## 2017-10-30 NOTE — Progress Notes (Signed)
Anesthesia Chart Review: Patient is a 37 year old female scheduled for laparoscopic cholecystectomy on 10/31/17 by Dr. Coralie Keens. PAT was at 3:00 PM on 10/30/17.  History includes never smoker, HTN, PCOS, fibroids, panic attacks, GERD, nephrolithiasis, parathyroidectomy.  BMI is consistent with morbid obesity.   - PCP is listed as Webb Silversmith, NP.  - Endocrinologist is Dr. Philemon Kingdom. - Cardiologist is Dr. Adrian Prows. Patient reports she is seen ~ every 6 months. Last office note from 06/2017 requested, but is till pending. I did find the 04/25/17 office note scanned under Media tab. She was seen for HTN and chest pain (with underlying "severe anxiety") follow-up.   Meds include Xanax, amlodipine, aspirin 81 mg, BuSpar, Prozac, metformin, Reglan, nitro, fish oil, Protonix, KCl, Citranatal Assure, Inderal LA, Zocor, Diovan-HCT.  BP (!) 142/70   Pulse 66   Temp 36.8 C   Resp 18   Ht 5\' 1"  (1.549 m)   Wt 214 lb 6.4 oz (97.3 kg)   LMP 07/30/2017   SpO2 99%   BMI 40.51 kg/m   EKG 10/30/17: SB at 58 bpm, T wave abnormality, consider inferior ischemia. She has at least a non-specific lateral ST abnormality. Lateral T wave changes are new or more prominent when compared to 01/18/16 or 02/26/15 tracings; however, according to cardiology notes, 02/16/17 tracing at [facility] showed minimal ST depression in inferior and lateral leads felt likely due to uncontrolled hypertension.    Echo 02/17/16 [facility] CV): Conclusion: 1.  Left ventricle cavity is normal in size.  Mild concentric hypertrophy of the left ventricle.  Normal global wall motion.  Normal diastolic filling pattern.  Calculated EF 72%. 2.  Left atrial cavity is normal size.  Aneurysmal interatrial septum with probable small incidental PFO. 3.  Trace mitral regurgitation. 4.  Trace tricuspid regurgitation.  Unable to estimate PA pressure due to absence/minimal TR signal.  Nuclear Stress Test (exercise) 02/21/16 [facility]  CV): Impression: 1. Resting EKG demonstrates NSR, normal axis, non-specific inferior and lateral ST segment depression with T-wave inversion. Cannot exclude ischemia. Stress EKG equivocal for ischemia with PVCs noted during peak exercise but no additional ST-T wave changes of ischemia. Patient exercised on a Bruce protocol for 4 minutes and 48 seconds and achieved 6.74 METS. Stress terminated due to fatigue.  Normal blood pressure response.  Low exercise tolerance for age. 2.  Raw images reveal prominent breast attenuation.  Perfusion imaging study demonstrates the left ventricle to be normal on both rest and stress images.  There is a small sized mild inferior ischemia in the basal anterior and anteroseptal region and the basal to mid inferior wall.  Left ventricular systolic function calculated QGS was mildly depressed at 47%. This is an intermediate risk study, clinical correlation recommended.  (In review of her 04/04/16 office note by Dr. Einar Gip scanned under Media tab, he felt her chronic stable chest pains were stable and recommended continued medical therapy at that time. EF was 72% by echo. )  Preoperative labs noted. CBC WNL. Cr 0.79. Glucose 97. She is for a urine pregnancy test on the day of surgery.   I am still awaiting her last cardiology note, but in review of available records it appears that she has had regular follow-up with cardiology since at least 02/2016. I do not see mention of any additional cardiac testing recommended. BP control has been stressed. By description, it also sounds like her EKG is stable as well. Anesthesiologist to evaluate on the day of surgery.  George Hugh [facility] Short Stay Center/Anesthesiology Phone [phone] 10/30/2017 5:26 PM

## 2017-10-30 NOTE — Progress Notes (Signed)
NOTIFIED ALLISON ZELENAK OF EKG.  REQUESTED LAST OFFICE NOTE AND EKG FROM DR. GANJI'S OFFICE.

## 2017-10-30 NOTE — H&P (Signed)
Debbie Walter Documented: 10/24/2017 3:45 PM Location: [facility] Patient #: [id number] DOB: 10-Jun-1981 Married / Language: Undefined / Race: Black or African American Female   History of Present Illness (Debbie Walter A. Ninfa Linden MD; 10/24/2017 4:12 PM) The patient is a 38 year old female who presents for evaluation of gall stones. This patient is referred by the emergency department for symptomatic cholelithiasis. She presented there on January 2 with epigastric abdominal pain hurting through to the back and right upper quadrant and associated nausea and vomiting. The pain was quite severe. She improved in the emergency department so was sent home. She is still having some discomfort and nausea but is still not as bad as she was in the emergency department. She denies jaundice. She had an ultrasound showing multiple gallstones including one measuring 1.2 cm. Liver function tests were normal. Her white blood count was elevated. She is otherwise without complaints. This was her first attack of gallstones   Past Surgical History Dalbert Mayotte, [place]; 10/24/2017 3:45 PM) Cesarean Section - Multiple   Diagnostic Studies History Dalbert Mayotte, [place]; 10/24/2017 3:45 PM) Colonoscopy  never Mammogram  never Pap Smear  1-5 years ago  Allergies Dalbert Mayotte, CMA; 10/24/2017 3:47 PM) No Known Drug Allergies [10/24/2017]:  Medication History Dalbert Mayotte, CMA; 10/24/2017 3:47 PM) Valsartan-Hydrochlorothiazide (320-25MG  Tablet, Oral) Active. OxyCODONE HCl (5MG  Tablet, Oral) Active. AmLODIPine Besylate (5MG  Tablet, Oral) Active. Amoxicillin (500MG  Capsule, Oral) Active. BusPIRone HCl (15MG  Tablet, Oral) Active. BusPIRone HCl (5MG  Tablet, Oral) Active. CitraNatal Assure (35-1 & 300MG  Misc, Oral) Active. Doxycycline Hyclate (100MG  Capsule, Oral) Active. Fluconazole (150MG  Tablet, Oral) Active. Fluocinonide (0.05% Cream, External) Active. FLUoxetine HCl (10MG  Tablet, Oral)  Active. Fluzone Quadrivalent (0.5ML Susp Pref Syr, Intramuscular) Active. Ketorolac Tromethamine (10MG  Tablet, Oral) Active. Klor-Con M20 [facility] Tablet ER, Oral) Active. MetFORMIN HCl (500MG  Tablet, Oral) Active. Metoclopramide HCl (10MG  Tablet, Oral) Active. Nitroglycerin (0.4MG  Tab Sublingual, Sublingual) Active. Nystatin (100000 UNIT/GM Powder, External) Active. Ofloxacin (0.3% Solution, Ophthalmic) Active. Ondansetron (8MG  Tablet Disint, Oral) Active. Pantoprazole Sodium (40MG  Tablet DR, Oral) Active. Propranolol HCl ER (160MG  Capsule ER 24HR, Oral) Active. Simvastatin (40MG  Tablet, Oral) Active. Sulfamethoxazole-Trimethoprim (800-160MG  Tablet, Oral) Active. Valsartan-Hydrochlorothiazide (160-12.5MG  Tablet, Oral) Active.  Social History Dalbert Mayotte, [place]; 10/24/2017 3:45 PM) Caffeine use  Carbonated beverages, Tea. No alcohol use  No drug use  Tobacco use  Never smoker.  Family History Dalbert Mayotte, [place]; 10/24/2017 3:45 PM) Diabetes Mellitus  Mother. Heart Disease  Mother. Hypertension  Father, Mother. Rectal Cancer  Father.  Pregnancy / Birth History Dalbert Mayotte, [place]; 10/24/2017 3:45 PM) Age at menarche  20 years. Gravida  6 Irregular periods  Maternal age  68-30 Para  2  Other Problems Dalbert Mayotte, [place]; 10/24/2017 3:45 PM) Gastroesophageal Reflux Disease  High blood pressure  Kidney Stone     Review of Systems Dalbert Mayotte CMA; 10/24/2017 3:45 PM) General Not Present- Appetite Loss, Chills, Fatigue, Fever, Night Sweats, Weight Gain and Weight Loss. Skin Not Present- Change in Wart/Mole, Dryness, Hives, Jaundice, New Lesions, Non-Healing Wounds, Rash and Ulcer. HEENT Present- Hoarseness. Not Present- Earache, Hearing Loss, Nose Bleed, Oral Ulcers, Ringing in the Ears, Seasonal Allergies, Sinus Pain, Sore Throat, Visual Disturbances, Wears glasses/contact lenses and Yellow Eyes. Respiratory Not Present- Bloody sputum, Chronic  Cough, Difficulty Breathing, Snoring and Wheezing. Breast Not Present- Breast Mass, Breast Pain, Nipple Discharge and Skin Changes. Cardiovascular Not Present- Chest Pain, Difficulty Breathing Lying Down, Leg Cramps, Palpitations, Rapid Heart Rate, Shortness of Breath and Swelling of Extremities. Gastrointestinal Present- Abdominal  Pain, Indigestion and Nausea. Not Present- Bloating, Bloody Stool, Change in Bowel Habits, Chronic diarrhea, Constipation, Difficulty Swallowing, Excessive gas, Gets full quickly at meals, Hemorrhoids, Rectal Pain and Vomiting. Female Genitourinary Not Present- Frequency, Nocturia, Painful Urination, Pelvic Pain and Urgency. Musculoskeletal Not Present- Back Pain, Joint Pain, Joint Stiffness, Muscle Pain, Muscle Weakness and Swelling of Extremities. Neurological Not Present- Decreased Memory, Fainting, Headaches, Numbness, Seizures, Tingling, Tremor, Trouble walking and Weakness. Endocrine Not Present- Cold Intolerance, Excessive Hunger, Hair Changes, Heat Intolerance, Hot flashes and New Diabetes. Hematology Not Present- Blood Thinners, Easy Bruising, Excessive bleeding, Gland problems, HIV and Persistent Infections.  Vitals Dalbert Mayotte CMA; 10/24/2017 3:48 PM) 10/24/2017 3:47 PM Weight: 216.6 lb Height: 60in Body Surface Area: 1.93 m Body Mass Index: 42.3 kg/m  Temp.: 98.63F  Pulse: 68 (Regular)  BP: 140/90 (Sitting, Left Arm, Standard)       Physical Exam (Aiven Kampe A. Ninfa Linden MD; 10/24/2017 4:12 PM) General Mental Status-Alert. General Appearance-Consistent with stated age. Hydration-Well hydrated. Voice-Normal.  Head and Neck Head-normocephalic, atraumatic with no lesions or palpable masses.  Eye Eyeball - Bilateral-Extraocular movements intact. Sclera/Conjunctiva - Bilateral-No scleral icterus.  Chest and Lung Exam Chest and lung exam reveals -quiet, even and easy respiratory effort with no use of accessory muscles and  on auscultation, normal breath sounds, no adventitious sounds and normal vocal resonance. Inspection Chest Wall - Normal. Back - normal.  Cardiovascular Cardiovascular examination reveals -on palpation PMI is normal in location and amplitude, no palpable S3 or S4. Normal cardiac borders., normal heart sounds, regular rate and rhythm with no murmurs, carotid auscultation reveals no bruits and normal pedal pulses bilaterally.  Abdomen Inspection Inspection of the abdomen reveals - No Hernias. Skin - Scar - no surgical scars. Palpation/Percussion Palpation and Percussion of the abdomen reveal - Soft, No Rebound tenderness, No Rigidity (guarding) and No hepatosplenomegaly. Tenderness - Epigastrium and Right Upper Quadrant. Note: There is mild to moderate tenderness in the epigastrium and right upper quadrant. Auscultation Auscultation of the abdomen reveals - Bowel sounds normal.  Neurologic - Did not examine.  Musculoskeletal - Did not examine.    Assessment & Plan (Jenalyn Girdner A. Ninfa Linden MD; 10/24/2017 4:13 PM) SYMPTOMATIC CHOLELITHIASIS (K80.20) Impression: I had a long discussion with the patient and her friend regarding gallbladder disease. We discussed laparoscopic cholecystectomy and I gave her literature regarding this. I discussed the surgical procedure in detail. I discussed the risks which includes but is not limited to bleeding, infection, injury to surrounding structures, bile duct injury, bile leak, the need to convert to an open procedure, cardiopulmonary issues, DVT, postoperative recovery, etc. I believe she does have some mild cholecystitis as well. Surgery is recommended urgently. She understands and is very proceed with surgery

## 2017-10-31 ENCOUNTER — Ambulatory Visit (HOSPITAL_COMMUNITY): Payer: Federal, State, Local not specified - PPO | Admitting: Anesthesiology

## 2017-10-31 ENCOUNTER — Ambulatory Visit (HOSPITAL_COMMUNITY): Payer: Federal, State, Local not specified - PPO | Admitting: Vascular Surgery

## 2017-10-31 ENCOUNTER — Encounter (HOSPITAL_COMMUNITY): Payer: Self-pay

## 2017-10-31 ENCOUNTER — Ambulatory Visit (HOSPITAL_COMMUNITY)
Admission: RE | Admit: 2017-10-31 | Discharge: 2017-10-31 | Disposition: A | Discharge status: Home / Self Care | Payer: Federal, State, Local not specified - PPO | Source: Ambulatory Visit | Attending: Surgery | Admitting: Surgery

## 2017-10-31 ENCOUNTER — Encounter (HOSPITAL_COMMUNITY)
Admission: RE | Disposition: A | Discharge status: Home / Self Care | Payer: Self-pay | Source: Ambulatory Visit | Attending: Surgery

## 2017-10-31 DIAGNOSIS — F419 Anxiety disorder, unspecified: Secondary | ICD-10-CM | POA: Diagnosis not present

## 2017-10-31 DIAGNOSIS — E785 Hyperlipidemia, unspecified: Secondary | ICD-10-CM | POA: Diagnosis not present

## 2017-10-31 DIAGNOSIS — K828 Other specified diseases of gallbladder: Secondary | ICD-10-CM | POA: Diagnosis not present

## 2017-10-31 DIAGNOSIS — K219 Gastro-esophageal reflux disease without esophagitis: Secondary | ICD-10-CM | POA: Insufficient documentation

## 2017-10-31 DIAGNOSIS — I10 Essential (primary) hypertension: Secondary | ICD-10-CM | POA: Insufficient documentation

## 2017-10-31 DIAGNOSIS — K8010 Calculus of gallbladder with chronic cholecystitis without obstruction: Secondary | ICD-10-CM | POA: Insufficient documentation

## 2017-10-31 DIAGNOSIS — Z6841 Body Mass Index (BMI) 40.0 and over, adult: Secondary | ICD-10-CM | POA: Diagnosis not present

## 2017-10-31 DIAGNOSIS — E282 Polycystic ovarian syndrome: Secondary | ICD-10-CM | POA: Diagnosis not present

## 2017-10-31 HISTORY — PX: CHOLECYSTECTOMY: SHX55

## 2017-10-31 LAB — SURGICAL PCR SCREEN
MRSA, PCR: POSITIVE — AB
STAPHYLOCOCCUS AUREUS: POSITIVE — AB

## 2017-10-31 LAB — POCT PREGNANCY, URINE: PREG TEST UR: NEGATIVE

## 2017-10-31 SURGERY — LAPAROSCOPIC CHOLECYSTECTOMY
Anesthesia: General | Site: Abdomen

## 2017-10-31 MED ORDER — KETOROLAC TROMETHAMINE 30 MG/ML IJ SOLN
INTRAMUSCULAR | Status: AC
  Filled 2017-10-31: qty 1

## 2017-10-31 MED ORDER — SODIUM CHLORIDE 0.9% FLUSH: 3.0000 mL | INTRAVENOUS | Status: DC | PRN

## 2017-10-31 MED ORDER — SUGAMMADEX SODIUM 200 MG/2ML IV SOLN
INTRAVENOUS | Status: DC | PRN
  Administered 2017-10-31: 200 mg via INTRAVENOUS

## 2017-10-31 MED ORDER — CHLORHEXIDINE GLUCONATE CLOTH 2 % EX PADS: 6.0000 | MEDICATED_PAD | Freq: Once | CUTANEOUS | Status: DC

## 2017-10-31 MED ORDER — LIDOCAINE HCL (CARDIAC) 20 MG/ML IV SOLN
INTRAVENOUS | Status: DC | PRN
  Administered 2017-10-31: 60 mg via INTRAVENOUS

## 2017-10-31 MED ORDER — OXYCODONE HCL 5 MG PO TABS
5.0000 mg | ORAL_TABLET | ORAL | Status: DC | PRN
  Administered 2017-10-31: 5 mg via ORAL

## 2017-10-31 MED ORDER — ROCURONIUM BROMIDE 10 MG/ML (PF) SYRINGE
PREFILLED_SYRINGE | INTRAVENOUS | Status: AC
  Filled 2017-10-31: qty 5

## 2017-10-31 MED ORDER — CEFAZOLIN SODIUM-DEXTROSE 2-4 GM/100ML-% IV SOLN
2.0000 g | INTRAVENOUS | Status: AC
  Administered 2017-10-31: 2 g via INTRAVENOUS
  Filled 2017-10-31: qty 100

## 2017-10-31 MED ORDER — MUPIROCIN 2 % EX OINT
TOPICAL_OINTMENT | CUTANEOUS | Status: AC
  Administered 2017-10-31: 1
  Filled 2017-10-31: qty 22

## 2017-10-31 MED ORDER — ACETAMINOPHEN 325 MG PO TABS: 650.0000 mg | ORAL_TABLET | ORAL | Status: DC | PRN

## 2017-10-31 MED ORDER — STERILE WATER FOR INJECTION IJ SOLN
INTRAMUSCULAR | Status: DC | PRN
  Administered 2017-10-31: 100 mL

## 2017-10-31 MED ORDER — KETOROLAC TROMETHAMINE 30 MG/ML IJ SOLN
30.0000 mg | Freq: Once | INTRAMUSCULAR | Status: AC
  Administered 2017-10-31: 30 mg via INTRAVENOUS

## 2017-10-31 MED ORDER — LACTATED RINGERS IV SOLN
INTRAVENOUS | Status: DC
  Administered 2017-10-31: 09:00:00 via INTRAVENOUS

## 2017-10-31 MED ORDER — MIDAZOLAM HCL 2 MG/2ML IJ SOLN
INTRAMUSCULAR | Status: AC
  Filled 2017-10-31: qty 2

## 2017-10-31 MED ORDER — OXYCODONE HCL 5 MG PO TABS
ORAL_TABLET | ORAL | Status: AC
  Filled 2017-10-31: qty 1

## 2017-10-31 MED ORDER — SUGAMMADEX SODIUM 200 MG/2ML IV SOLN
INTRAVENOUS | Status: AC
  Filled 2017-10-31: qty 2

## 2017-10-31 MED ORDER — FENTANYL CITRATE (PF) 100 MCG/2ML IJ SOLN: 25.0000 ug | INTRAMUSCULAR | Status: DC | PRN

## 2017-10-31 MED ORDER — LIDOCAINE 2% (20 MG/ML) 5 ML SYRINGE
INTRAMUSCULAR | Status: AC
  Filled 2017-10-31: qty 5

## 2017-10-31 MED ORDER — FENTANYL CITRATE (PF) 250 MCG/5ML IJ SOLN
INTRAMUSCULAR | Status: AC
  Filled 2017-10-31: qty 5

## 2017-10-31 MED ORDER — DEXAMETHASONE SODIUM PHOSPHATE 10 MG/ML IJ SOLN
INTRAMUSCULAR | Status: AC
  Filled 2017-10-31: qty 1

## 2017-10-31 MED ORDER — PROPOFOL 10 MG/ML IV BOLUS
INTRAVENOUS | Status: DC | PRN
  Administered 2017-10-31: 150 mg via INTRAVENOUS
  Administered 2017-10-31: 20 mg via INTRAVENOUS

## 2017-10-31 MED ORDER — PROPOFOL 10 MG/ML IV BOLUS
INTRAVENOUS | Status: AC
  Filled 2017-10-31: qty 20

## 2017-10-31 MED ORDER — HYDROMORPHONE HCL 1 MG/ML IJ SOLN: 0.2500 mg | INTRAMUSCULAR | Status: DC | PRN

## 2017-10-31 MED ORDER — SODIUM CHLORIDE 0.9 % IR SOLN
Status: DC | PRN
  Administered 2017-10-31: 1000 mL

## 2017-10-31 MED ORDER — ROCURONIUM BROMIDE 100 MG/10ML IV SOLN
INTRAVENOUS | Status: DC | PRN
  Administered 2017-10-31: 30 mg via INTRAVENOUS

## 2017-10-31 MED ORDER — FENTANYL CITRATE (PF) 100 MCG/2ML IJ SOLN
INTRAMUSCULAR | Status: DC | PRN
  Administered 2017-10-31: 100 ug via INTRAVENOUS
  Administered 2017-10-31 (×2): 50 ug via INTRAVENOUS

## 2017-10-31 MED ORDER — BUPIVACAINE-EPINEPHRINE 0.25% -1:200000 IJ SOLN
INTRAMUSCULAR | Status: AC
  Filled 2017-10-31: qty 1

## 2017-10-31 MED ORDER — DEXAMETHASONE SODIUM PHOSPHATE 10 MG/ML IJ SOLN
INTRAMUSCULAR | Status: DC | PRN
  Administered 2017-10-31: 10 mg via INTRAVENOUS

## 2017-10-31 MED ORDER — ONDANSETRON HCL 4 MG/2ML IJ SOLN
INTRAMUSCULAR | Status: DC | PRN
  Administered 2017-10-31: 4 mg via INTRAVENOUS

## 2017-10-31 MED ORDER — BUPIVACAINE-EPINEPHRINE 0.25% -1:200000 IJ SOLN
INTRAMUSCULAR | Status: DC | PRN
  Administered 2017-10-31: 20 mL

## 2017-10-31 MED ORDER — OXYCODONE HCL 5 MG PO TABS: 5.0000 mg | ORAL_TABLET | Freq: Four times a day (QID) | ORAL | 0 refills | Status: DC | PRN

## 2017-10-31 MED ORDER — SODIUM CHLORIDE 0.9% FLUSH: 3.0000 mL | Freq: Two times a day (BID) | INTRAVENOUS | Status: DC

## 2017-10-31 MED ORDER — ACETAMINOPHEN 650 MG RE SUPP: 650.0000 mg | RECTAL | Status: DC | PRN

## 2017-10-31 MED ORDER — 0.9 % SODIUM CHLORIDE (POUR BTL) OPTIME
TOPICAL | Status: DC | PRN
  Administered 2017-10-31: 1000 mL

## 2017-10-31 MED ORDER — ONDANSETRON HCL 4 MG/2ML IJ SOLN
INTRAMUSCULAR | Status: AC
  Filled 2017-10-31: qty 2

## 2017-10-31 MED ORDER — MIDAZOLAM HCL 5 MG/5ML IJ SOLN
INTRAMUSCULAR | Status: DC | PRN
  Administered 2017-10-31: 2 mg via INTRAVENOUS

## 2017-10-31 MED ORDER — SODIUM CHLORIDE 0.9 % IV SOLN: 250.0000 mL | INTRAVENOUS | Status: DC | PRN

## 2017-10-31 SURGICAL SUPPLY — 42 items
ADH SKN CLS APL DERMABOND .7 (GAUZE/BANDAGES/DRESSINGS) ×1
APPLIER CLIP 5 13 M/L LIGAMAX5 (MISCELLANEOUS) ×2
APR CLP MED LRG 5 ANG JAW (MISCELLANEOUS) ×1
BAG SPEC RTRVL LRG 6X4 10 (ENDOMECHANICALS) ×1
CANISTER SUCT 3000ML PPV (MISCELLANEOUS) ×2 IMPLANT
CHLORAPREP W/TINT 26ML (MISCELLANEOUS) ×2 IMPLANT
CLIP APPLIE 5 13 M/L LIGAMAX5 (MISCELLANEOUS) ×1 IMPLANT
COVER SURGICAL LIGHT HANDLE (MISCELLANEOUS) ×2 IMPLANT
DERMABOND ADVANCED (GAUZE/BANDAGES/DRESSINGS) ×1
DERMABOND ADVANCED .7 DNX12 (GAUZE/BANDAGES/DRESSINGS) ×1 IMPLANT
ELECT REM PT RETURN 9FT ADLT (ELECTROSURGICAL) ×2
ELECTRODE REM PT RTRN 9FT ADLT (ELECTROSURGICAL) ×1 IMPLANT
GLOVE BIOGEL PI IND STRL 6.5 (GLOVE) IMPLANT
GLOVE BIOGEL PI IND STRL 7.0 (GLOVE) IMPLANT
GLOVE BIOGEL PI IND STRL 8 (GLOVE) IMPLANT
GLOVE BIOGEL PI INDICATOR 6.5 (GLOVE) ×1
GLOVE BIOGEL PI INDICATOR 7.0 (GLOVE) ×1
GLOVE BIOGEL PI INDICATOR 8 (GLOVE) ×1
GLOVE ECLIPSE 6.0 STRL STRAW (GLOVE) ×1 IMPLANT
GLOVE ECLIPSE 8.0 STRL XLNG CF (GLOVE) ×2 IMPLANT
GLOVE SURG SIGNA 7.5 PF LTX (GLOVE) ×2 IMPLANT
GOWN STRL REUS W/ TWL LRG LVL3 (GOWN DISPOSABLE) ×2 IMPLANT
GOWN STRL REUS W/ TWL XL LVL3 (GOWN DISPOSABLE) ×1 IMPLANT
GOWN STRL REUS W/TWL LRG LVL3 (GOWN DISPOSABLE) ×4
GOWN STRL REUS W/TWL XL LVL3 (GOWN DISPOSABLE) ×2
KIT BASIN OR (CUSTOM PROCEDURE TRAY) ×2 IMPLANT
KIT ROOM TURNOVER OR (KITS) ×2 IMPLANT
NS IRRIG 1000ML POUR BTL (IV SOLUTION) ×2 IMPLANT
PAD ARMBOARD 7.5X6 YLW CONV (MISCELLANEOUS) ×2 IMPLANT
POUCH SPECIMEN RETRIEVAL 10MM (ENDOMECHANICALS) ×2 IMPLANT
SCISSORS LAP 5X35 DISP (ENDOMECHANICALS) ×2 IMPLANT
SET IRRIG TUBING LAPAROSCOPIC (IRRIGATION / IRRIGATOR) ×2 IMPLANT
SLEEVE ENDOPATH XCEL 5M (ENDOMECHANICALS) ×4 IMPLANT
SPECIMEN JAR SMALL (MISCELLANEOUS) ×2 IMPLANT
SUT MNCRL AB 4-0 PS2 18 (SUTURE) ×2 IMPLANT
TOWEL OR 17X24 6PK STRL BLUE (TOWEL DISPOSABLE) ×2 IMPLANT
TOWEL OR 17X26 10 PK STRL BLUE (TOWEL DISPOSABLE) ×2 IMPLANT
TRAY LAPAROSCOPIC MC (CUSTOM PROCEDURE TRAY) ×2 IMPLANT
TROCAR XCEL BLUNT TIP 100MML (ENDOMECHANICALS) ×2 IMPLANT
TROCAR XCEL NON-BLD 5MMX100MML (ENDOMECHANICALS) ×2 IMPLANT
TUBING INSUFFLATION (TUBING) ×2 IMPLANT
WATER STERILE IRR 1000ML POUR (IV SOLUTION) ×2 IMPLANT

## 2017-10-31 NOTE — Discharge Instructions (Signed)
[facility] ______[facility] LAPAROSCOPIC SURGERY: POST OP INSTRUCTIONS Always review your discharge instruction sheet given to you by the facility where your surgery was performed. IF YOU HAVE DISABILITY OR FAMILY LEAVE FORMS, YOU MUST BRING THEM TO THE OFFICE FOR PROCESSING.   DO NOT GIVE THEM TO YOUR DOCTOR.  1. A prescription for pain medication may be given to you upon discharge.  Take your pain medication as prescribed, if needed.  If narcotic pain medicine is not needed, then you may take acetaminophen (Tylenol) or ibuprofen (Advil) as needed. 2. Take your usually prescribed medications unless otherwise directed. 3. If you need a refill on your pain medication, please contact your pharmacy.  They will contact our office to request authorization. Prescriptions will not be filled after 5pm or on week-ends. 4. You should follow a light diet the first few days after arrival home, such as soup and crackers, etc.  Be sure to include lots of fluids daily. 5. Most patients will experience some swelling and bruising in the area of the incisions.  Ice packs will help.  Swelling and bruising can take several days to resolve.  6. It is common to experience some constipation if taking pain medication after surgery.  Increasing fluid intake and taking a stool softener (such as Colace) will usually help or prevent this problem from occurring.  A mild laxative (Milk of Magnesia or Miralax) should be taken according to package instructions if there are no bowel movements after 48 hours. 7. Unless discharge instructions indicate otherwise, you may remove your bandages 24-48 hours after surgery, and you may shower at that time.  You may have steri-strips (small skin tapes) in place directly over the incision.  These strips should be left on the skin for 7-10 days.  If your surgeon used skin glue on the incision, you may shower in 24 hours.  The glue will flake off over the next 2-3 weeks.  Any sutures or  staples will be removed at the office during your follow-up visit. 8. ACTIVITIES:  You may resume regular (light) daily activities beginning the next day--such as daily self-care, walking, climbing stairs--gradually increasing activities as tolerated.  You may have sexual intercourse when it is comfortable.  Refrain from any heavy lifting or straining until approved by your doctor. a. You may drive when you are no longer taking prescription pain medication, you can comfortably wear a seatbelt, and you can safely maneuver your car and apply brakes. b. RETURN TO WORK:  __________________________________________________________ 9. You should see your doctor in the office for a follow-up appointment approximately 2-3 weeks after your surgery.  Make sure that you call for this appointment within a day or two after you arrive home to insure a convenient appointment time. 10. OTHER INSTRUCTIONS:NO LIFTING MORE THAN 15 POUNDS FOR 2 WEEKS 11. OK TO SHOWER STARTING TOMORROW 12. ICE PACK, TYLENOL, IBUPROFEN ALSO FOR PAIN __________________________________________________________________________________________________________________________ __________________________________________________________________________________________________________________________ WHEN TO CALL YOUR DOCTOR: 1. Fever over 101.0 2. Inability to urinate 3. Continued bleeding from incision. 4. Increased pain, redness, or drainage from the incision. 5. Increasing abdominal pain  The clinic staff is available to answer your questions during regular business hours.  Please dont hesitate to call and ask to speak to one of the nurses for clinical concerns.  If you have a medical emergency, go to the nearest emergency room or call 911.  A surgeon from [facility] is always on call at the hospital. [address], [place], [place],  [place]  [address] ? [address], [place], [place]   [address] [phone] ?  [phone] ? FAX [phone] Web site: [email or web address]

## 2017-10-31 NOTE — Op Note (Signed)

## 2017-10-31 NOTE — Interval H&P Note (Signed)
History and Physical Interval Note:no change in H and P  10/31/2017 9:08 AM  Debbie Walter  has presented today for surgery, with the diagnosis of SYMPTOMATIC GALLSTONES  The various methods of treatment have been discussed with the patient and family. After consideration of risks, benefits and other options for treatment, the patient has consented to  Procedure(s): LAPAROSCOPIC CHOLECYSTECTOMY (N/A) as a surgical intervention .  The patient's history has been reviewed, patient examined, no change in status, stable for surgery.  I have reviewed the patient's chart and labs.  Questions were answered to the patient's satisfaction.     Saurav Crumble A

## 2017-10-31 NOTE — Anesthesia Preprocedure Evaluation (Addendum)
Anesthesia Evaluation  Patient identified by MRN, date of birth, ID band Patient awake    Reviewed: Allergy & Precautions, H&P , NPO status , Patient's Chart, lab work & pertinent test results, reviewed documented beta blocker date and time   Airway Mallampati: II  TM Distance: >3 FB Neck ROM: Full    Dental no notable dental hx. (+) Teeth Intact, Dental Advisory Given   Pulmonary neg pulmonary ROS,    Pulmonary exam normal breath sounds clear to auscultation       Cardiovascular hypertension, Pt. on medications and Pt. on home beta blockers  Rhythm:Regular Rate:Normal     Neuro/Psych Anxiety negative neurological ROS  negative psych ROS   GI/Hepatic Neg liver ROS, GERD  Medicated and Controlled,  Endo/Other  Morbid obesity  Renal/GU negative Renal ROS  negative genitourinary   Musculoskeletal   Abdominal   Peds  Hematology negative hematology ROS (+)   Anesthesia Other Findings   Reproductive/Obstetrics negative OB ROS                            Anesthesia Physical Anesthesia Plan  ASA: III  Anesthesia Plan: General   Post-op Pain Management:    Induction: Intravenous  PONV Risk Score and Plan: 4 or greater and Ondansetron, Dexamethasone and Midazolam  Airway Management Planned: Oral ETT  Additional Equipment:   Intra-op Plan:   Post-operative Plan: Extubation in OR  Informed Consent: I have reviewed the patients History and Physical, chart, labs and discussed the procedure including the risks, benefits and alternatives for the proposed anesthesia with the patient or authorized representative who has indicated his/her understanding and acceptance.   Dental advisory given  Plan Discussed with: CRNA  Anesthesia Plan Comments:         Anesthesia Quick Evaluation

## 2017-10-31 NOTE — Progress Notes (Signed)
Dr. Ninfa Linden, D., made aware of Positive MRSA and STAPH pcr screen. Per Dr. Ninfa Linden, no additional antibiotics are needed.

## 2017-10-31 NOTE — Anesthesia Postprocedure Evaluation (Signed)
Anesthesia Post Note  Patient: Debbie Walter  Procedure(s) Performed: LAPAROSCOPIC CHOLECYSTECTOMY (N/A Abdomen)     Patient location during evaluation: PACU Anesthesia Type: General Level of consciousness: awake and alert Pain management: pain level controlled Vital Signs Assessment: post-procedure vital signs reviewed and stable Respiratory status: spontaneous breathing, nonlabored ventilation and respiratory function stable Cardiovascular status: blood pressure returned to baseline and stable Postop Assessment: no apparent nausea or vomiting Anesthetic complications: no    Last Vitals:  Vitals:   10/31/17 1047 10/31/17 1130  BP:  127/61  Pulse: 74 70  Resp: (!) 22 18  Temp:  (!) 36.1 C  SpO2: 96% 94%    Last Pain:  Vitals:   10/31/17 1047  TempSrc:   PainSc: 4                  Alhassan Everingham,W. EDMOND

## 2017-10-31 NOTE — Anesthesia Procedure Notes (Signed)
Procedure Name: Intubation Date/Time: 10/31/2017 9:38 AM Performed by: Kyung Rudd, CRNA Pre-anesthesia Checklist: Patient identified, Emergency Drugs available, Suction available and Patient being monitored Patient Re-evaluated:Patient Re-evaluated prior to induction Oxygen Delivery Method: Circle system utilized Preoxygenation: Pre-oxygenation with 100% oxygen Induction Type: IV induction Ventilation: Mask ventilation without difficulty Laryngoscope Size: Mac and 4 Grade View: Grade I Tube type: Oral Tube size: 7.0 mm Number of attempts: 1 Airway Equipment and Method: Stylet Placement Confirmation: ETT inserted through vocal cords under direct vision,  positive ETCO2 and breath sounds checked- equal and bilateral Secured at: 20 cm Tube secured with: Tape Dental Injury: Teeth and Oropharynx as per pre-operative assessment

## 2017-10-31 NOTE — Transfer of Care (Signed)
Immediate Anesthesia Transfer of Care Note  Patient: Debbie Walter  Procedure(s) Performed: LAPAROSCOPIC CHOLECYSTECTOMY (N/A Abdomen)  Patient Location: PACU  Anesthesia Type:General  Level of Consciousness: drowsy  Airway & Oxygen Therapy: Patient Spontanous Breathing and Patient connected to nasal cannula oxygen  Post-op Assessment: Report given to RN, Post -op Vital signs reviewed and stable and Patient moving all extremities  Post vital signs: Reviewed and stable  Last Vitals:  Vitals:   10/31/17 1027 10/31/17 1030  BP:  (!) 149/76  Pulse: 79 60  Resp: (!) 9 (!) 9  Temp: (!) 36.1 C   SpO2: 94% 96%    Last Pain:  Vitals:   10/31/17 0852  TempSrc:   PainSc: 0-No pain      Patients Stated Pain Goal: 2 (86/75/44 9201)  Complications: No apparent anesthesia complications

## 2017-11-01 ENCOUNTER — Encounter (HOSPITAL_COMMUNITY): Payer: Self-pay | Admitting: Surgery

## 2017-12-03 ENCOUNTER — Other Ambulatory Visit: Payer: Self-pay | Admitting: Internal Medicine

## 2017-12-03 MED ORDER — BUSPIRONE HCL 5 MG PO TABS: 5.0000 mg | ORAL_TABLET | Freq: Every day | ORAL | 2 refills | Status: DC

## 2017-12-14 ENCOUNTER — Other Ambulatory Visit: Payer: Self-pay | Admitting: Internal Medicine

## 2017-12-14 NOTE — Telephone Encounter (Signed)
Please advise if pt is supposed to continue medication 

## 2018-01-11 ENCOUNTER — Telehealth: Payer: Self-pay | Admitting: Internal Medicine

## 2018-01-11 NOTE — Telephone Encounter (Signed)
Do we know what alternatives they will cover?

## 2018-01-11 NOTE — Telephone Encounter (Signed)
Pharmacy named a few such as omeprazole.

## 2018-01-11 NOTE — Telephone Encounter (Signed)
Patient is willing to try something different.

## 2018-01-11 NOTE — Telephone Encounter (Signed)
Call pt:  We just need to know if she has tried anything prior to Pantoprazole.

## 2018-01-11 NOTE — Telephone Encounter (Signed)
Called and spoke with pharmacy they state that with insurance it will only cover 90 day supply for a 365 day period. I informed Debbie Walter and gave her the information. Being that patient hasn't tried an alterative medication a PA is unnecessary. Debbie Walter is currently working on this.

## 2018-01-11 NOTE — Telephone Encounter (Signed)
Copied from [place] [id number]. Topic: Quick Communication - See Telephone Encounter >> Jan 11, 2018  9:53 AM Aurelio Brash B wrote: CRM for notification. See Telephone encounter for: 01/11/18. PT requested refill on pantoprazole (PROTONIX) 40 MG tablet,  pharmacy says  office needs approval from insurance -but pt is totally out.     She can get 90 day supply for $16  with insurance approval  [id number] [name], [name] - [address] [phone] (Phone) [phone] (Fax)

## 2018-01-13 MED ORDER — DEXLANSOPRAZOLE 30 MG PO CPDR: 30.0000 mg | DELAYED_RELEASE_CAPSULE | Freq: Every day | ORAL | 0 refills | Status: DC

## 2018-01-13 NOTE — Addendum Note (Signed)
Addended by: [name] on: 01/13/2018 12:06 PM   Modules accepted: Orders

## 2018-01-13 NOTE — Telephone Encounter (Signed)
Dexilant sent to pharmacy

## 2018-02-08 ENCOUNTER — Ambulatory Visit: Payer: Federal, State, Local not specified - PPO | Admitting: Internal Medicine

## 2018-02-10 ENCOUNTER — Other Ambulatory Visit: Payer: Self-pay | Admitting: Family Medicine

## 2018-02-10 DIAGNOSIS — F430 Acute stress reaction: Secondary | ICD-10-CM

## 2018-02-12 ENCOUNTER — Telehealth: Payer: Self-pay | Admitting: Internal Medicine

## 2018-02-12 NOTE — Telephone Encounter (Signed)
Please refill encounter... Refill was denied as this Rx was from 1 yr ago and prescribed by another provider... Pt needs an appt for any refills, [facility] will not fill without an office visit

## 2018-02-12 NOTE — Telephone Encounter (Signed)
Spoke with pt she needs a refill on xanax [facility]

## 2018-02-12 NOTE — Telephone Encounter (Signed)
This was last filled 03/2017 by Dr Damita Dunnings... Please advise

## 2018-03-05 ENCOUNTER — Other Ambulatory Visit: Payer: Self-pay | Admitting: Internal Medicine

## 2018-03-05 DIAGNOSIS — F430 Acute stress reaction: Secondary | ICD-10-CM

## 2018-03-06 ENCOUNTER — Other Ambulatory Visit: Payer: Self-pay | Admitting: Internal Medicine

## 2018-03-06 DIAGNOSIS — F430 Acute stress reaction: Secondary | ICD-10-CM

## 2018-03-06 NOTE — Telephone Encounter (Addendum)
Patient stated she was in the office to see Webb Silversmith in January 2019 for a physical.  So she doesn't know why she needs to come in again to get her ALPRAZolam Duanne Moron) 0.25 MG tablet prescription refilled.  Please advise.

## 2018-03-07 NOTE — Telephone Encounter (Signed)
Left detailed msg on VM per HIPAA  

## 2018-03-07 NOTE — Telephone Encounter (Signed)
This medication was not discussed at office visit and the Rx was never filled by [facility], it was another provider. This is a controlled substance and [facility] needs to have a face-to-face with pt if she is going to fill this medication as she has NEVER filled the medication

## 2018-03-12 ENCOUNTER — Other Ambulatory Visit: Payer: Self-pay | Admitting: Internal Medicine

## 2018-03-19 ENCOUNTER — Telehealth: Payer: Self-pay | Admitting: Internal Medicine

## 2018-03-19 NOTE — Telephone Encounter (Signed)
fmla paperwork in Debbie Walter's in box Pt aware Debbie Walter is out of office

## 2018-03-25 NOTE — Telephone Encounter (Signed)
Paperwork faxed °

## 2018-03-25 NOTE — Telephone Encounter (Signed)
Done, given back to robin

## 2018-03-26 NOTE — Telephone Encounter (Signed)
Left message letting pt know paperwork has been faxed °Copy for pt  °Copy for scan °

## 2018-04-03 ENCOUNTER — Other Ambulatory Visit: Payer: Self-pay | Admitting: Internal Medicine

## 2018-04-17 DIAGNOSIS — L83 Acanthosis nigricans: Secondary | ICD-10-CM | POA: Diagnosis not present

## 2018-04-17 DIAGNOSIS — L700 Acne vulgaris: Secondary | ICD-10-CM | POA: Diagnosis not present

## 2018-04-17 DIAGNOSIS — L918 Other hypertrophic disorders of the skin: Secondary | ICD-10-CM | POA: Diagnosis not present

## 2018-04-17 DIAGNOSIS — L308 Other specified dermatitis: Secondary | ICD-10-CM | POA: Diagnosis not present

## 2018-05-01 DIAGNOSIS — I10 Essential (primary) hypertension: Secondary | ICD-10-CM | POA: Diagnosis not present

## 2018-05-01 DIAGNOSIS — Z6841 Body Mass Index (BMI) 40.0 and over, adult: Secondary | ICD-10-CM | POA: Diagnosis not present

## 2018-05-01 DIAGNOSIS — Z0189 Encounter for other specified special examinations: Secondary | ICD-10-CM | POA: Diagnosis not present

## 2018-05-08 ENCOUNTER — Telehealth: Payer: Self-pay | Admitting: Internal Medicine

## 2018-05-08 ENCOUNTER — Ambulatory Visit: Payer: Federal, State, Local not specified - PPO | Admitting: Internal Medicine

## 2018-05-08 DIAGNOSIS — Z0289 Encounter for other administrative examinations: Secondary | ICD-10-CM

## 2018-05-08 DIAGNOSIS — F430 Acute stress reaction: Secondary | ICD-10-CM

## 2018-05-08 NOTE — Telephone Encounter (Signed)
Pt scheduled med refill for 10.9.19 are we able to fill Xanax until then.

## 2018-05-08 NOTE — Telephone Encounter (Signed)
Yes, but she will not be getting #60, which was changed without my authorization. We will go back down to #30 as I was previously prescribing.

## 2018-05-09 MED ORDER — ALPRAZOLAM 0.25 MG PO TABS: 0.2500 mg | ORAL_TABLET | Freq: Every day | ORAL | 0 refills | Status: DC | PRN

## 2018-05-09 NOTE — Telephone Encounter (Signed)
Sh said she does need a refill

## 2018-05-09 NOTE — Telephone Encounter (Signed)
Ok, will not be able to pick up until 9/6

## 2018-05-09 NOTE — Addendum Note (Signed)
Addended by: [name] on: 05/09/2018 11:19 AM   Modules accepted: Orders

## 2018-05-09 NOTE — Telephone Encounter (Signed)
Does she need a refill now?

## 2018-05-09 NOTE — Telephone Encounter (Signed)
Pt is okay with #30 of Xanax.Debbie KitchenMarland Walter

## 2018-05-10 ENCOUNTER — Telehealth: Payer: Self-pay

## 2018-05-10 NOTE — Telephone Encounter (Signed)
Pt called to see why she had to wait until 05/24/18 to get Xanax filled. I spoke with Avie Echevaria NP; R [facility] was changing pt back to # 30 and per last refill of # 60 next rx would be due 05/24/18. Pt said she does not take med every day and she should have enough to last until 05/24/18. Pt voiced understanding and nothing further needed.

## 2018-05-15 DIAGNOSIS — H10413 Chronic giant papillary conjunctivitis, bilateral: Secondary | ICD-10-CM | POA: Diagnosis not present

## 2018-05-15 DIAGNOSIS — H04123 Dry eye syndrome of bilateral lacrimal glands: Secondary | ICD-10-CM | POA: Diagnosis not present

## 2018-05-21 ENCOUNTER — Other Ambulatory Visit: Payer: Self-pay | Admitting: Internal Medicine

## 2018-05-22 DIAGNOSIS — I10 Essential (primary) hypertension: Secondary | ICD-10-CM | POA: Diagnosis not present

## 2018-05-31 ENCOUNTER — Telehealth: Payer: Self-pay | Admitting: Internal Medicine

## 2018-06-10 ENCOUNTER — Encounter

## 2018-06-13 ENCOUNTER — Ambulatory Visit: Payer: Federal, State, Local not specified - PPO | Admitting: Internal Medicine

## 2018-06-13 ENCOUNTER — Encounter: Payer: Self-pay | Admitting: Internal Medicine

## 2018-06-13 VITALS — BP 132/80 | HR 74 | Temp 98.7°F | Wt 224.0 lb

## 2018-06-13 DIAGNOSIS — N611 Abscess of the breast and nipple: Secondary | ICD-10-CM

## 2018-06-13 MED ORDER — DEXLANSOPRAZOLE 30 MG PO CPDR: 30.0000 mg | DELAYED_RELEASE_CAPSULE | Freq: Every day | ORAL | 0 refills | Status: DC

## 2018-06-13 MED ORDER — PANTOPRAZOLE SODIUM 40 MG PO TBEC: 40.0000 mg | DELAYED_RELEASE_TABLET | Freq: Every day | ORAL | 0 refills | Status: DC

## 2018-06-13 MED ORDER — SULFAMETHOXAZOLE-TRIMETHOPRIM 800-160 MG PO TABS: 1.0000 | ORAL_TABLET | Freq: Two times a day (BID) | ORAL | 0 refills | Status: DC

## 2018-06-13 NOTE — Telephone Encounter (Addendum)
Pt states that she has been in contact with the pharmacy, and they are telling her she needs a PA for this medication.  She also states that she is now completely out of her medication.  Please advise.

## 2018-06-13 NOTE — Patient Instructions (Signed)
Skin Abscess A skin abscess is an infected area on or under your skin that contains pus and other material. An abscess can happen almost anywhere on your body. Some abscesses break open (rupture) on their own. Most continue to get worse unless they are treated. The infection can spread deeper into the body and into your blood, which can make you feel sick. Treatment usually involves draining the abscess. Follow these instructions at home: Abscess Care  If you have an abscess that has not drained, place a warm, clean, wet washcloth over the abscess several times a day. Do this as told by your doctor.  Follow instructions from your doctor about how to take care of your abscess. Make sure you: ? Cover the abscess with a bandage (dressing). ? Change your bandage or gauze as told by your doctor. ? Wash your hands with soap and water before you change the bandage or gauze. If you cannot use soap and water, use hand sanitizer.  Check your abscess every day for signs that the infection is getting worse. Check for: ? More redness, swelling, or pain. ? More fluid or blood. ? Warmth. ? More pus or a bad smell. Medicines   Take over-the-counter and prescription medicines only as told by your doctor.  If you were prescribed an antibiotic medicine, take it as told by your doctor. Do not stop taking the antibiotic even if you start to feel better. General instructions  To avoid spreading the infection: ? Do not share personal care items, towels, or hot tubs with others. ? Avoid making skin-to-skin contact with other people.  Keep all follow-up visits as told by your doctor. This is important. Contact a doctor if:  You have more redness, swelling, or pain around your abscess.  You have more fluid or blood coming from your abscess.  Your abscess feels warm when you touch it.  You have more pus or a bad smell coming from your abscess.  You have a fever.  Your muscles ache.  You have  chills.  You feel sick. Get help right away if:  You have very bad (severe) pain.  You see red streaks on your skin spreading away from the abscess. This information is not intended to replace advice given to you by your health care provider. Make sure you discuss any questions you have with your health care provider. Document Released: 02/21/2008 Document Revised: 04/30/2016 Document Reviewed: 07/14/2015 Elsevier Interactive Patient Education  2018 Elsevier Inc.  

## 2018-06-13 NOTE — Progress Notes (Signed)
Subjective:    Patient ID: Debbie Walter, female    DOB: 1980/10/19, 37 y.o.   MRN: [id number]  HPI  Pt presents to the clinic today with c/o a wound to her left breast. She reports 5 days ago, a bump popped up. She squeezed it and white discharge came out. There was no smell. Now she is noticing yellow/blood tinged discharge from the area. She has applied a warm compress, cleansed the area with peroxide and used Neosporin with some relief. She denies fever, chills or body aches.  Review of Systems      Past Medical History:  Diagnosis Date  . Fibroids   . GERD (gastroesophageal reflux disease)    takes protonix  . History of kidney stones   . Hypertension   . MRSA infection    under arm  . Panic attacks   . PCOS (polycystic ovarian syndrome)    takes metformin for this    Current Outpatient Medications  Medication Sig Dispense Refill  . ALPRAZolam (XANAX) 0.25 MG tablet Take 1 tablet (0.25 mg total) by mouth daily as needed. 30 tablet 0  . amLODipine (NORVASC) 5 MG tablet Take 5 mg by mouth daily.    Marland Kitchen aspirin EC 81 MG tablet Take 81 mg by mouth daily.    . busPIRone (BUSPAR) 5 MG tablet Take 1 tablet (5 mg total) by mouth daily. 90 tablet 2  . cholecalciferol (VITAMIN D) 1000 UNITS tablet Take 1,000 Units by mouth daily.     . cyanocobalamin 500 MCG tablet Take 1 tablet (500 mcg total) by mouth daily. (Patient taking differently: Take 500 mcg by mouth every other day. )    . Dexlansoprazole 30 MG capsule Take 1 capsule (30 mg total) by mouth daily. 90 capsule 0  . FLUoxetine (PROZAC) 10 MG tablet TAKE 1 TABLET BY MOUTH EVERY DAY 90 tablet 2  . KLOR-CON M20 20 MEQ tablet TAKE 2 TABLETS BY MOUTH EVERY MORNING AND TAKE 1 TABLET EVERY EVENING 270 tablet 0  . metFORMIN (GLUCOPHAGE) 500 MG tablet Take 1 tablet (500 mg total) by mouth 2 (two) times daily with a meal. 180 tablet 3  . metoCLOPramide (REGLAN) 10 MG tablet Take 1 tablet (10 mg total) by mouth every 8 (eight) hours as  needed (headache or nausea). 30 tablet 0  . nitroGLYCERIN (NITROSTAT) 0.4 MG SL tablet DISSOLVE 1 TABLET UNDER THE TONUGE EVERY MINUTE AS NEEDED FOR CHEST PAIN  1  . Omega-3 Fatty Acids (FISH OIL) 1000 MG CAPS Take 1 capsule by mouth daily.    Marland Kitchen oxyCODONE (OXY IR/ROXICODONE) 5 MG immediate release tablet Take 1-2 tablets (5-10 mg total) by mouth every 6 (six) hours as needed for moderate pain, severe pain or breakthrough pain. 20 tablet 0  . pantoprazole (PROTONIX) 40 MG tablet TAKE 1 TABLET BY MOUTH EVERY DAY 90 tablet 0  . potassium chloride SA (K-DUR,KLOR-CON) 20 MEQ tablet Take 1 tablet (20 mEq total) by mouth daily. (Patient taking differently: Take 20-40 mEq by mouth See admin instructions. 40 meq in the morning, 20 meq in the evening) 3 tablet 0  . Prenat w/o A-FeCbGl-DSS-FA-DHA (CITRANATAL ASSURE) 35-1 & 300 MG tablet Take 1 tablet by mouth daily.  11  . Probiotic Product (PROBIOTIC DAILY PO) Take 1 capsule by mouth daily.    . propranolol ER (INDERAL LA) 160 MG SR capsule Take 160 mg by mouth daily.  0  . simvastatin (ZOCOR) 20 MG tablet Take 20 mg by mouth  at bedtime.  5  . valsartan-hydrochlorothiazide (DIOVAN-HCT) 320-25 MG tablet Take 1 tablet by mouth every morning.   1   No current facility-administered medications for this visit.     Allergies  Allergen Reactions  . Procardia [Nifedipine] Other (See Comments)    headache  . Wellbutrin [Bupropion] Other (See Comments)    "Makes my blood pressure go up - I don't ever want to take it again"  . Labetalol Other (See Comments)    "makes me feel like things are crawling through my head"  . Hydromorphone Other (See Comments)    Makes pt feel out of this world. Patient states it needs to be pushed slow, but she can take it.  . Morphine And Related     Needs to be pushed slow or pt feels like she will code  . Sudafed [Pseudoephedrine Hcl]     Heart race / dries out mucus membranes  . Zithromax [Azithromycin] Other (See Comments)     Gi upset.  Diarrhea    Family History  Problem Relation Age of Onset  . Hyperlipidemia Mother   . Heart disease Mother   . Diabetes Mother   . Hypertension Mother   . Hyperlipidemia Father   . Hypertension Father   . Cancer Father        rectal  . Hypertension Sister   . Anesthesia problems Neg Hx   . Hypotension Neg Hx   . Malignant hyperthermia Neg Hx   . Pseudochol deficiency Neg Hx     Social History   Socioeconomic History  . Marital status: Married    Spouse name: Not on file  . Number of children: Not on file  . Years of education: Not on file  . Highest education level: Not on file  Occupational History  . Not on file  Social Needs  . Financial resource strain: Not on file  . Food insecurity:    Worry: Not on file    Inability: Not on file  . Transportation needs:    Medical: Not on file    Non-medical: Not on file  Tobacco Use  . Smoking status: Never Smoker  . Smokeless tobacco: Never Used  Substance and Sexual Activity  . Alcohol use: No    Alcohol/week: 0.0 standard drinks  . Drug use: No  . Sexual activity: Yes    Birth control/protection: None  Lifestyle  . Physical activity:    Days per week: Not on file    Minutes per session: Not on file  . Stress: Not on file  Relationships  . Social connections:    Talks on phone: Not on file    Gets together: Not on file    Attends religious service: Not on file    Active member of club or organization: Not on file    Attends meetings of clubs or organizations: Not on file    Relationship status: Not on file  . Intimate partner violence:    Fear of current or ex partner: Not on file    Emotionally abused: Not on file    Physically abused: Not on file    Forced sexual activity: Not on file  Other Topics Concern  . Not on file  Social History Narrative  . Not on file     Constitutional: Denies fever, malaise, fatigue, headache or abrupt weight changes.  Skin: Pt reports wound to left breast.      No other specific complaints in a complete review of systems (  except as listed in HPI above).  Objective:   Physical Exam  BP 132/80   Pulse 74   Temp 98.7 F (37.1 C) (Oral)   Wt 224 lb (101.6 kg)   SpO2 98%   BMI 42.32 kg/m  Wt Readings from Last 3 Encounters:  06/13/18 224 lb (101.6 kg)  10/31/17 214 lb (97.1 kg)  10/30/17 214 lb 6.4 oz (97.3 kg)    General: Appears her stated age, obese in NAD. Skin: 1 cm open, non draining abscess noted to left lateral breast. Surrounding induration noted.   BMET    Component Value Date/Time   NA 141 10/30/2017 1605   K 3.6 10/30/2017 1605   CL 103 10/30/2017 1605   CO2 27 10/30/2017 1605   GLUCOSE 97 10/30/2017 1605   BUN 5 (L) 10/30/2017 1605   CREATININE 0.79 10/30/2017 1605   CREATININE 0.76 02/08/2017 1705   CALCIUM 9.4 10/30/2017 1605   GFRNONAA >60 10/30/2017 1605   GFRNONAA >89 02/08/2017 1705   GFRAA >60 10/30/2017 1605   GFRAA >89 02/08/2017 1705    Lipid Panel     Component Value Date/Time   CHOL 123 09/26/2017 1456   TRIG 170.0 (H) 09/26/2017 1456   HDL 38.50 (L) 09/26/2017 1456   CHOLHDL 3 09/26/2017 1456   VLDL 34.0 09/26/2017 1456   LDLCALC 50 09/26/2017 1456    CBC    Component Value Date/Time   WBC 10.5 10/30/2017 1605   RBC 5.01 10/30/2017 1605   HGB 13.3 10/30/2017 1605   HCT 40.9 10/30/2017 1605   PLT 376 10/30/2017 1605   MCV 81.6 10/30/2017 1605   MCH 26.5 10/30/2017 1605   MCHC 32.5 10/30/2017 1605   RDW 15.0 10/30/2017 1605   LYMPHSABS 1.8 01/12/2017 0907   MONOABS 0.4 01/12/2017 0907   EOSABS 0.1 01/12/2017 0907   BASOSABS 0.0 01/12/2017 0907    Hgb A1C Lab Results  Component Value Date   HGBA1C 6.0 02/08/2017            Assessment & Plan:   Breast Abscess:  Continue warm compresses TID Ibuprofen as needed for pain and inflammation eRx for Septra DS BID x 10 days  Return precautions discussed Webb Silversmith, NP

## 2018-06-14 ENCOUNTER — Telehealth: Payer: Self-pay | Admitting: Internal Medicine

## 2018-06-14 ENCOUNTER — Ambulatory Visit: Payer: Federal, State, Local not specified - PPO | Admitting: Internal Medicine

## 2018-06-14 NOTE — Telephone Encounter (Signed)
Copied from [place] [id number]. Topic: Quick Communication - See Telephone Encounter >> Jun 14, 2018  8:28 AM Conception Chancy, NT wrote: CRM for notification. See Telephone encounter for: 06/14/18.  Patient is calling and states when she went to the pharmacy to get sulfamethoxazole-trimethoprim (BACTRIM DS,SEPTRA DS) 800-160 MG tablet they told her that this could interfere with her dieretic. She would like to know is it okay to continue taking. She gets her potassium checked 06/19/18. She would like a call back and a voicemail if she does not answer. [phone]

## 2018-06-14 NOTE — Telephone Encounter (Signed)
Yes, she should continue taking.

## 2018-06-14 NOTE — Telephone Encounter (Signed)
PA was completed via telephone and has been approved, will fax approval to pharmacy

## 2018-06-17 NOTE — Telephone Encounter (Signed)
Pt is aware as instructed 

## 2018-06-19 DIAGNOSIS — I10 Essential (primary) hypertension: Secondary | ICD-10-CM | POA: Diagnosis not present

## 2018-06-26 ENCOUNTER — Ambulatory Visit: Payer: Federal, State, Local not specified - PPO | Admitting: Internal Medicine

## 2018-06-30 ENCOUNTER — Other Ambulatory Visit: Payer: Self-pay | Admitting: Internal Medicine

## 2018-07-10 DIAGNOSIS — Z3200 Encounter for pregnancy test, result unknown: Secondary | ICD-10-CM | POA: Diagnosis not present

## 2018-07-10 DIAGNOSIS — Z6841 Body Mass Index (BMI) 40.0 and over, adult: Secondary | ICD-10-CM | POA: Diagnosis not present

## 2018-07-10 DIAGNOSIS — Z01419 Encounter for gynecological examination (general) (routine) without abnormal findings: Secondary | ICD-10-CM | POA: Diagnosis not present

## 2018-07-25 ENCOUNTER — Other Ambulatory Visit: Payer: Self-pay | Admitting: Internal Medicine

## 2018-07-29 ENCOUNTER — Telehealth: Payer: Self-pay | Admitting: *Deleted

## 2018-07-29 DIAGNOSIS — T502X5A Adverse effect of carbonic-anhydrase inhibitors, benzothiadiazides and other diuretics, initial encounter: Principal | ICD-10-CM

## 2018-07-29 DIAGNOSIS — E876 Hypokalemia: Secondary | ICD-10-CM

## 2018-07-29 NOTE — Telephone Encounter (Signed)
Patient called stating that her pharmacy has sent 3 refill request over starting on Wednesday for a refill on Potassium. Patient requested that this be done today. Pharmacy [facility]

## 2018-07-29 NOTE — Telephone Encounter (Signed)
I have called the pt's mobile # 2 times and error msg states there are call restrictions unable to complete call.... Called home number, no answer and no VM...   PLEASE tell pt that she needs to have her Potassium labs rechecked, SCHEDULE LAB only appointment, as it has been almost 12 mths since last checked and her blood pressure medication had been changed.   The medication should not cause low potassium readings anymore and may no longer need prescription

## 2018-07-31 ENCOUNTER — Other Ambulatory Visit: Payer: Self-pay | Admitting: Internal Medicine

## 2018-08-02 NOTE — Telephone Encounter (Signed)
Called pt and it seem like someone picked up then hung up.... Pt needs to call Cardiology to fill potassium supplement as her last lab provided from their office was normal and her medication was changed from a diuretic, current medication should not cause low potassium readings.  Pt needs to call cardiology for refills

## 2018-08-07 DIAGNOSIS — L308 Other specified dermatitis: Secondary | ICD-10-CM | POA: Diagnosis not present

## 2018-08-07 NOTE — Telephone Encounter (Signed)
Please sign and close encounter when completed. 

## 2018-08-20 ENCOUNTER — Other Ambulatory Visit: Payer: Self-pay | Admitting: Internal Medicine

## 2018-08-21 DIAGNOSIS — I10 Essential (primary) hypertension: Secondary | ICD-10-CM | POA: Diagnosis not present

## 2018-08-28 DIAGNOSIS — I10 Essential (primary) hypertension: Secondary | ICD-10-CM | POA: Diagnosis not present

## 2018-08-28 DIAGNOSIS — M7989 Other specified soft tissue disorders: Secondary | ICD-10-CM | POA: Diagnosis not present

## 2018-08-28 DIAGNOSIS — Z6841 Body Mass Index (BMI) 40.0 and over, adult: Secondary | ICD-10-CM | POA: Diagnosis not present

## 2018-08-30 ENCOUNTER — Ambulatory Visit: Payer: Federal, State, Local not specified - PPO | Admitting: Internal Medicine

## 2018-09-02 ENCOUNTER — Other Ambulatory Visit: Payer: Self-pay | Admitting: Internal Medicine

## 2018-09-20 ENCOUNTER — Telehealth: Payer: Self-pay | Admitting: Internal Medicine

## 2018-09-20 NOTE — Telephone Encounter (Signed)
Done, placed in my outbox.

## 2018-09-20 NOTE — Telephone Encounter (Signed)
Paperwork faxed °

## 2018-09-20 NOTE — Telephone Encounter (Signed)
Pt called stating her fmla is about to expire.  She stated she call her fmla contact and was told that all she needed to do was change the date on the last one and initial and date if you don't have any changes.  I changed dates to 09/20/2018 if you are ok with this all you need to do is initial and date changes

## 2018-09-23 NOTE — Telephone Encounter (Signed)
Pt called back stated end date needed to be 03/25/2019  Please initial and date Paperwork in [facility] in box

## 2018-09-24 NOTE — Telephone Encounter (Signed)
Done, placed in my outbox.

## 2018-09-25 NOTE — Telephone Encounter (Signed)
Left message asking pt to call office  Please let her know fmla paperwork was refaxed 09/24/2018 and there is a copy here for her.   Copy for pt Copy for scan

## 2018-09-27 ENCOUNTER — Encounter: Payer: Federal, State, Local not specified - PPO | Admitting: Internal Medicine

## 2018-09-29 ENCOUNTER — Other Ambulatory Visit: Payer: Self-pay | Admitting: Internal Medicine

## 2018-10-15 DIAGNOSIS — J019 Acute sinusitis, unspecified: Secondary | ICD-10-CM | POA: Diagnosis not present

## 2018-10-15 DIAGNOSIS — F419 Anxiety disorder, unspecified: Secondary | ICD-10-CM | POA: Insufficient documentation

## 2018-10-15 DIAGNOSIS — R05 Cough: Secondary | ICD-10-CM | POA: Diagnosis not present

## 2018-10-16 ENCOUNTER — Ambulatory Visit: Payer: Federal, State, Local not specified - PPO | Admitting: Internal Medicine

## 2018-10-19 ENCOUNTER — Encounter (HOSPITAL_COMMUNITY): Payer: Self-pay | Admitting: Emergency Medicine

## 2018-10-19 ENCOUNTER — Other Ambulatory Visit: Payer: Self-pay

## 2018-10-19 ENCOUNTER — Emergency Department (HOSPITAL_COMMUNITY): Payer: Federal, State, Local not specified - PPO

## 2018-10-19 ENCOUNTER — Emergency Department (HOSPITAL_COMMUNITY)
Admission: EM | Admit: 2018-10-19 | Discharge: 2018-10-19 | Disposition: A | Discharge status: Home / Self Care | Payer: Federal, State, Local not specified - PPO | Attending: Emergency Medicine | Admitting: Emergency Medicine

## 2018-10-19 DIAGNOSIS — F419 Anxiety disorder, unspecified: Secondary | ICD-10-CM | POA: Insufficient documentation

## 2018-10-19 DIAGNOSIS — Z79899 Other long term (current) drug therapy: Secondary | ICD-10-CM | POA: Insufficient documentation

## 2018-10-19 DIAGNOSIS — Z9049 Acquired absence of other specified parts of digestive tract: Secondary | ICD-10-CM | POA: Diagnosis not present

## 2018-10-19 DIAGNOSIS — Z7982 Long term (current) use of aspirin: Secondary | ICD-10-CM | POA: Diagnosis not present

## 2018-10-19 DIAGNOSIS — R0789 Other chest pain: Secondary | ICD-10-CM | POA: Diagnosis not present

## 2018-10-19 DIAGNOSIS — R05 Cough: Secondary | ICD-10-CM | POA: Diagnosis not present

## 2018-10-19 DIAGNOSIS — I10 Essential (primary) hypertension: Secondary | ICD-10-CM | POA: Diagnosis not present

## 2018-10-19 DIAGNOSIS — R079 Chest pain, unspecified: Secondary | ICD-10-CM | POA: Diagnosis not present

## 2018-10-19 DIAGNOSIS — F329 Major depressive disorder, single episode, unspecified: Secondary | ICD-10-CM | POA: Diagnosis not present

## 2018-10-19 DIAGNOSIS — J40 Bronchitis, not specified as acute or chronic: Secondary | ICD-10-CM | POA: Diagnosis not present

## 2018-10-19 DIAGNOSIS — R062 Wheezing: Secondary | ICD-10-CM | POA: Diagnosis not present

## 2018-10-19 LAB — INFLUENZA PANEL BY PCR (TYPE A & B)
Influenza A By PCR: NEGATIVE
Influenza B By PCR: NEGATIVE

## 2018-10-19 MED ORDER — HYDROCOD POLST-CPM POLST ER 10-8 MG/5ML PO SUER
5.0000 mL | Freq: Once | ORAL | Status: AC
  Administered 2018-10-19: 5 mL via ORAL
  Filled 2018-10-19: qty 5

## 2018-10-19 MED ORDER — IPRATROPIUM-ALBUTEROL 0.5-2.5 (3) MG/3ML IN SOLN
3.0000 mL | Freq: Once | RESPIRATORY_TRACT | Status: AC
  Administered 2018-10-19: 3 mL via RESPIRATORY_TRACT
  Filled 2018-10-19: qty 3

## 2018-10-19 MED ORDER — GUAIFENESIN-CODEINE 100-10 MG/5ML PO SOLN: 5.0000 mL | Freq: Four times a day (QID) | ORAL | 0 refills | Status: DC | PRN

## 2018-10-19 MED ORDER — ALBUTEROL SULFATE (5 MG/ML) 0.5% IN NEBU: 2.5000 mg | INHALATION_SOLUTION | Freq: Four times a day (QID) | RESPIRATORY_TRACT | 1 refills | Status: DC | PRN

## 2018-10-19 MED ORDER — ALBUTEROL SULFATE HFA 108 (90 BASE) MCG/ACT IN AERS
2.0000 | INHALATION_SPRAY | RESPIRATORY_TRACT | Status: DC | PRN
  Administered 2018-10-19: 2 via RESPIRATORY_TRACT
  Filled 2018-10-19: qty 6.7

## 2018-10-19 MED ORDER — HYDROCOD POLST-CPM POLST ER 10-8 MG/5ML PO SUER: 5.0000 mL | Freq: Two times a day (BID) | ORAL | 0 refills | Status: DC | PRN

## 2018-10-19 NOTE — ED Provider Notes (Signed)
[facility] DEPT Provider Note   CSN: [id number] Arrival date & time: 10/19/18  1149     History   Chief Complaint Chief Complaint  Patient presents with  . Cough    HPI Debbie Walter is a 38 y.o. female who presents to the ED via EMS for a cough. Patient reports being dx with sinus infection last week at Urgent Care. She was prescribed Augmentin and prednisone. Patient has been taking the medication as prescribed but continues to have cough. Neb treatment administered in route by EMS.  HPI  Past Medical History:  Diagnosis Date  . Fibroids   . GERD (gastroesophageal reflux disease)    takes protonix  . History of kidney stones   . Hypertension   . MRSA infection    under arm  . Panic attacks   . PCOS (polycystic ovarian syndrome)    takes metformin for this    Patient Active Problem List   Diagnosis Date Noted  . HLD (hyperlipidemia) 09/26/2017  . Multiple thyroid nodules 01/17/2016  . HTN (hypertension) 02/10/2015  . PCOS (polycystic ovarian syndrome) 02/10/2015  . Anxiety and depression 02/10/2015  . GERD (gastroesophageal reflux disease) 02/10/2015  . Elevated urine levels of catecholamines 01/14/2015  . H/O hyperparathyroidism 01/14/2015    Past Surgical History:  Procedure Laterality Date  . CESAREAN SECTION    . CESAREAN SECTION WITH BILATERAL TUBAL LIGATION Bilateral 05/15/2014   Procedure: CESAREAN SECTION WITH BILATERAL TUBAL LIGATION;  Surgeon: Cyril Mourning, MD;  Location: [facility];  Service: Obstetrics;  Laterality: Bilateral;  repeat  edc 06/10/14  . CHOLECYSTECTOMY N/A 10/31/2017   Procedure: LAPAROSCOPIC CHOLECYSTECTOMY;  Surgeon: Coralie Keens, MD;  Location: [facility];  Service: General;  Laterality: N/A;  . HYSTEROSCOPY    . PARATHYROIDECTOMY     REMOVED PART OF THYROID   . PARATHYROIDECTOMY       OB History    Gravida  6   Para  2   Term  1   Preterm  1   AB  4   Living  2     SAB  3   TAB     Ectopic  1   Multiple      Live Births  2            Home Medications    Prior to Admission medications   Medication Sig Start Date End Date Taking? Authorizing Provider  ALPRAZolam (XANAX) 0.25 MG tablet Take 1 tablet (0.25 mg total) by mouth daily as needed. 05/09/18   [name], NP  amLODipine (NORVASC) 5 MG tablet Take by mouth.    [provider]  aspirin EC 81 MG tablet Take 81 mg by mouth daily.    [provider]  busPIRone (BUSPAR) 5 MG tablet TAKE 1 TABLET BY MOUTH EVERY DAY 08/21/18   [name], NP  cholecalciferol (VITAMIN D) 1000 UNITS tablet Take 1,000 Units by mouth daily.     [provider]  cyanocobalamin 500 MCG tablet Take 1 tablet (500 mcg total) by mouth daily. Patient taking differently: Take 500 mcg by mouth every other day.  03/23/16   [name], NP  FLUoxetine (PROZAC) 10 MG tablet TAKE 1 TABLET BY MOUTH EVERY DAY 10/02/18   [name], NP  guaiFENesin-codeine 100-10 MG/5ML syrup Take 5 mLs by mouth every 6 (six) hours as needed for cough. 10/19/18   Ashley Murrain, NP  KLOR-CON M20 20 MEQ tablet  TAKE 2 TABLETS BY MOUTH EVERY MORNING AND TAKE 1 TABLET EVERY EVENING 03/12/18   [name], NP  metFORMIN (GLUCOPHAGE) 500 MG tablet Take 1 tablet (500 mg total) by mouth 2 (two) times daily with a meal. 01/14/15   Philemon Kingdom, MD  metoCLOPramide (REGLAN) 10 MG tablet Take 1 tablet (10 mg total) by mouth every 8 (eight) hours as needed (headache or nausea). 10/13/17   Antonietta Breach, PA-C  nitroGLYCERIN (NITROSTAT) 0.4 MG SL tablet DISSOLVE 1 TABLET UNDER THE TONUGE EVERY MINUTE AS NEEDED FOR CHEST PAIN 02/29/16   [provider]  Omega-3 Fatty Acids (FISH OIL) 1000 MG CAPS Take 1 capsule by mouth daily.    [provider]  oxyCODONE (OXY IR/ROXICODONE) 5 MG immediate release tablet Take 1-2 tablets (5-10 mg total) by mouth every 6 (six) hours as needed for moderate pain, severe pain or breakthrough  pain. 10/31/17   Coralie Keens, MD  pantoprazole (PROTONIX) 40 MG tablet TAKE 1 TABLET BY MOUTH EVERY DAY 09/03/18   [name], NP  potassium chloride SA (K-DUR,KLOR-CON) 20 MEQ tablet Take 1 tablet (20 mEq total) by mouth daily. Patient taking differently: Take 20-40 mEq by mouth See admin instructions. 40 meq in the morning, 20 meq in the evening 10/20/17   Muthersbaugh, Jarrett Soho, PA-C  Prenat w/o A-FeCbGl-DSS-FA-DHA (CITRANATAL ASSURE) 35-1 & 300 MG tablet Take 1 tablet by mouth daily. 01/28/15   [provider]  Probiotic Product (PROBIOTIC DAILY PO) Take 1 capsule by mouth daily.    [provider]  propranolol ER (INDERAL LA) 160 MG SR capsule Take 160 mg by mouth daily. 01/18/17   [provider]  simvastatin (ZOCOR) 20 MG tablet Take 20 mg by mouth at bedtime. 12/20/16   [provider]  spironolactone (ALDACTONE) 25 MG tablet TAKE 1 (ONE) TABLET DAILY IN THE MORNING 05/27/18   [provider]  sulfamethoxazole-trimethoprim (BACTRIM DS,SEPTRA DS) 800-160 MG tablet Take 1 tablet by mouth 2 (two) times daily. 06/13/18   [name], NP  valsartan (DIOVAN) 320 MG tablet Take 320 mg by mouth daily. 05/26/18   [provider]    Family History Family History  Problem Relation Age of Onset  . Hyperlipidemia Mother   . Heart disease Mother   . Diabetes Mother   . Hypertension Mother   . Hyperlipidemia Father   . Hypertension Father   . Cancer Father        rectal  . Hypertension Sister   . Anesthesia problems Neg Hx   . Hypotension Neg Hx   . Malignant hyperthermia Neg Hx   . Pseudochol deficiency Neg Hx     Social History Social History   Tobacco Use  . Smoking status: Never Smoker  . Smokeless tobacco: Never Used  Substance Use Topics  . Alcohol use: No    Alcohol/week: 0.0 standard drinks  . Drug use: No     Allergies   Procardia [nifedipine]; Wellbutrin [bupropion]; Labetalol; Hydromorphone; Morphine and related;  Sudafed [pseudoephedrine hcl]; and Zithromax [azithromycin]   Review of Systems Review of Systems  Constitutional: Negative for chills and fever.  HENT: Positive for rhinorrhea, sinus pressure and sinus pain. Negative for dental problem, ear pain, sore throat and trouble swallowing.   Eyes: Negative for discharge, redness and visual disturbance.  Respiratory: Positive for cough, shortness of breath and wheezing.   Genitourinary: Negative for dysuria, frequency and urgency.  Musculoskeletal: Positive for myalgias. Negative for neck pain and neck stiffness.  Skin: Negative for rash.  Neurological: Negative for syncope and headaches.  Psychiatric/Behavioral: Negative for confusion.     Physical Exam Updated Vital Signs BP (!) 176/91 (BP Location: Right Arm)   Pulse 69   Temp 98.5 F (36.9 C) (Oral)   Resp 18   SpO2 100%   Physical Exam Vitals signs and nursing note reviewed.  Constitutional:      Appearance: She is well-developed.  HENT:     Head: Normocephalic.     Right Ear: Tympanic membrane normal.     Left Ear: Tympanic membrane normal.     Nose: Congestion present.     Mouth/Throat:     Mouth: Mucous membranes are moist.     Pharynx: No oropharyngeal exudate or posterior oropharyngeal erythema.  Eyes:     Extraocular Movements: Extraocular movements intact.     Conjunctiva/sclera: Conjunctivae normal.  Neck:     Musculoskeletal: Neck supple.  Cardiovascular:     Rate and Rhythm: Normal rate and regular rhythm.  Pulmonary:     Effort: Pulmonary effort is normal.     Breath sounds: Wheezing present. No rales.  Chest:     Chest wall: Tenderness present.  Musculoskeletal: Normal range of motion.  Lymphadenopathy:     Cervical: No cervical adenopathy.  Skin:    General: Skin is warm and dry.  Neurological:     Mental Status: She is alert and oriented to person, place, and time.  Psychiatric:        Mood and Affect: Mood normal.      ED Treatments / Results   Labs (all labs ordered are listed, but only abnormal results are displayed) Labs Reviewed  INFLUENZA PANEL BY PCR (TYPE A & B)    Radiology Dg Chest 2 View  Result Date: 10/19/2018 CLINICAL DATA:  Pt states that she has been wheezing for about a week. Wheezing is worse at night. Pt states productive cough with yellow mucous. No known heart or lung conditions.Hx of HTN. Non smoker. EXAM: CHEST - 2 VIEW COMPARISON:  12/13/2011 FINDINGS: Cardiac silhouette is normal in size. No mediastinal or hilar masses. No evidence of adenopathy. Clear lungs.  No pleural effusion or pneumothorax. Stable changes from previous parathyroid surgery. Skeletal structures are intact. IMPRESSION: No active cardiopulmonary disease. Electronically Signed   By: Lajean Manes M.D.   On: 10/19/2018 13:00    Procedures Procedures (including critical care time)  Medications Ordered in ED Medications  albuterol (PROVENTIL HFA;VENTOLIN HFA) 108 (90 Base) MCG/ACT inhaler 2 puff (has no administration in time range)  ipratropium-albuterol (DUONEB) 0.5-2.5 (3) MG/3ML nebulizer solution 3 mL (3 mLs Nebulization Given 10/19/18 1333)  chlorpheniramine-HYDROcodone (TUSSIONEX) 10-8 MG/5ML suspension 5 mL (5 mLs Oral Given 10/19/18 1437)   Patient's symptoms improved with treatment in the ED. Patient appears stable for d/c without respiratory distress and O2 SAT 100% on R/A. Discussed with the patient lab and x-ray results and plan of care. Patient voices understanding and agrees with plan. She will f/u with her PCP for recheck. Return precautions discussed.   Initial Impression / Assessment and Plan / ED Course  I have reviewed the triage vital signs and the nursing notes.   Final Clinical Impressions(s) / ED Diagnoses   Final diagnoses:  Bronchitis    ED Discharge Orders         Ordered    guaiFENesin-codeine 100-10 MG/5ML syrup  Every 6 hours PRN     10/19/18 1537  Debroah Baller [place], [place] 10/19/18 0721      Isla Pence, MD 10/19/18 1544

## 2018-10-19 NOTE — ED Triage Notes (Signed)
Per EMS, patient diagnosed with sinus infection last week at [facility]. Reports compliance with Augmentin and prednisone without relief. C/o productive cough.   Breathing treatment with EMS

## 2018-10-19 NOTE — ED Notes (Signed)
Bed: WTR6 Expected date:  Expected time:  Means of arrival:  Comments: 

## 2018-10-19 NOTE — Discharge Instructions (Addendum)
Continue your medications that you are currently taking. Use the inhaler 2 puffs every 4 hours as needed. Do not drive while taking the cough medication as it will make you sleepy. Follow up with your doctor in the next few days for recheck. Return here for worsening symptoms.

## 2018-10-22 ENCOUNTER — Other Ambulatory Visit: Payer: Self-pay | Admitting: Cardiology

## 2018-11-06 ENCOUNTER — Encounter: Payer: Federal, State, Local not specified - PPO | Admitting: Internal Medicine

## 2018-11-13 ENCOUNTER — Other Ambulatory Visit: Payer: Self-pay | Admitting: Cardiology

## 2018-11-22 ENCOUNTER — Other Ambulatory Visit: Payer: Self-pay | Admitting: Internal Medicine

## 2018-11-25 NOTE — Progress Notes (Signed)
Subjective:   Debbie Walter, female    DOB: 06/04/1981, 38 y.o.   MRN: [id number]  Debbie Fenton, NP:  Chief Complaint  Patient presents with  . Hypertension  . Follow-up    last EKG 05/01/18    HPI: Debbie Walter  is a 38 y.o. female  with hypertension, morbid obesity, polycystic ovarian syndrome, severe anxiety, and insulin resistance.  Presents today for 3 month office visit follow-up for hypertension. Patient has had difficulty with controlling her blood pressure for the last several years and has had negative renal Dopplers in the past. Amlodipine was discontinued at her last office visit due to leg edema, which has now resolved.  Denies chest pain, PND, orthopnea, palpitations, syncopal episodes, or symptoms of claudication or TIA. Has chronic shortness of breath on exertion.   She was seen in the ER on 10/19/2018 for coughing, wheezing, and shortness of breath found to be related to bronchitis. States that she is just now beginning to feel better. States that she has not been able to focus on herself to try to lose weight with being sick and also with her family stress.    Reports that she has recently seperated from her husband. She has not been watching her diet or exercising lately.   Past Medical History:  Diagnosis Date  . Fibroids   . GERD (gastroesophageal reflux disease)    takes protonix  . History of kidney stones   . Hypertension   . MRSA infection    under arm  . Panic attacks   . PCOS (polycystic ovarian syndrome)    takes metformin for this    Past Surgical History:  Procedure Laterality Date  . CESAREAN SECTION    . CESAREAN SECTION WITH BILATERAL TUBAL LIGATION Bilateral 05/15/2014   Procedure: CESAREAN SECTION WITH BILATERAL TUBAL LIGATION;  Surgeon: Cyril Mourning, MD;  Location: [facility];  Service: Obstetrics;  Laterality: Bilateral;  repeat  edc 06/10/14  . CHOLECYSTECTOMY N/A 10/31/2017   Procedure: LAPAROSCOPIC CHOLECYSTECTOMY;  Surgeon:  Coralie Keens, MD;  Location: [facility];  Service: General;  Laterality: N/A;  . HYSTEROSCOPY    . PARATHYROIDECTOMY     REMOVED PART OF THYROID   . PARATHYROIDECTOMY      Family History  Problem Relation Age of Onset  . Hyperlipidemia Mother   . Heart disease Mother   . Diabetes Mother   . Hypertension Mother   . Hyperlipidemia Father   . Hypertension Father   . Cancer Father        rectal  . Hypertension Sister   . Anesthesia problems Neg Hx   . Hypotension Neg Hx   . Malignant hyperthermia Neg Hx   . Pseudochol deficiency Neg Hx     Social History   Socioeconomic History  . Marital status: Married    Spouse name: Not on file  . Number of children: Not on file  . Years of education: Not on file  . Highest education level: Not on file  Occupational History  . Not on file  Social Needs  . Financial resource strain: Not on file  . Food insecurity:    Worry: Not on file    Inability: Not on file  . Transportation needs:    Medical: Not on file    Non-medical: Not on file  Tobacco Use  . Smoking status: Never Smoker  . Smokeless tobacco: Never Used  Substance and Sexual Activity  . Alcohol use:  No    Alcohol/week: 0.0 standard drinks  . Drug use: No  . Sexual activity: Yes    Birth control/protection: None  Lifestyle  . Physical activity:    Days per week: Not on file    Minutes per session: Not on file  . Stress: Not on file  Relationships  . Social connections:    Talks on phone: Not on file    Gets together: Not on file    Attends religious service: Not on file    Active member of club or organization: Not on file    Attends meetings of clubs or organizations: Not on file    Relationship status: Not on file  . Intimate partner violence:    Fear of current or ex partner: Not on file    Emotionally abused: Not on file    Physically abused: Not on file    Forced sexual activity: Not on file  Other Topics Concern  . Not on file  Social History  Narrative  . Not on file    Current Meds  Medication Sig  . albuterol (PROVENTIL) (5 MG/ML) 0.5% nebulizer solution Take 0.5 mLs (2.5 mg total) by nebulization every 6 (six) hours as needed for wheezing or shortness of breath.  . ALPRAZolam (XANAX) 0.25 MG tablet Take 1 tablet (0.25 mg total) by mouth daily as needed.  Marland Kitchen aspirin EC 81 MG tablet Take 81 mg by mouth daily.  . busPIRone (BUSPAR) 5 MG tablet TAKE 1 TABLET BY MOUTH EVERY DAY  . cholecalciferol (VITAMIN D) 1000 UNITS tablet Take 1,000 Units by mouth daily.   . cyanocobalamin 500 MCG tablet Take 1 tablet (500 mcg total) by mouth daily.  Marland Kitchen FLUoxetine (PROZAC) 10 MG tablet TAKE 1 TABLET BY MOUTH EVERY DAY  . fluticasone (FLONASE ALLERGY RELIEF) 50 MCG/ACT nasal spray Place 1 spray into the nose as needed.  . metFORMIN (GLUCOPHAGE) 500 MG tablet Take 1 tablet (500 mg total) by mouth 2 (two) times daily with a meal.  . nitroGLYCERIN (NITROSTAT) 0.4 MG SL tablet DISSOLVE 1 TABLET UNDER THE TONUGE EVERY MINUTE AS NEEDED FOR CHEST PAIN  . Omega-3 Fatty Acids (FISH OIL) 1000 MG CAPS Take 1 capsule by mouth daily.  . pantoprazole (PROTONIX) 40 MG tablet TAKE 1 TABLET BY MOUTH EVERY DAY  . Prenat w/o A-[id number]-DHA (CITRANATAL ASSURE) 35-1 & 300 MG tablet Take 1 tablet by mouth daily.  . Probiotic Product (PROBIOTIC DAILY PO) Take 1 capsule by mouth daily.  . propranolol ER (INDERAL LA) 160 MG SR capsule Take 160 mg by mouth daily.  . simvastatin (ZOCOR) 20 MG tablet Take 20 mg by mouth at bedtime.  Marland Kitchen spironolactone (ALDACTONE) 25 MG tablet TAKE 1 (ONE) TABLET DAILY IN THE MORNING  . valsartan (DIOVAN) 320 MG tablet TAKE 1 TABLET BY MOUTH EVERY DAY     Review of Systems  Constitution: Negative for decreased appetite, malaise/fatigue, weight gain and weight loss.  Eyes: Negative for visual disturbance.  Cardiovascular: Positive for dyspnea on exertion and leg swelling. Negative for chest pain, claudication, orthopnea, palpitations  and syncope.  Respiratory: Negative for hemoptysis and wheezing.   Endocrine: Negative for cold intolerance and heat intolerance.  Hematologic/Lymphatic: Does not bruise/bleed easily.  Skin: Negative for nail changes.  Musculoskeletal: Negative for muscle weakness and myalgias.  Gastrointestinal: Negative for abdominal pain, change in bowel habit, nausea and vomiting.  Neurological: Negative for difficulty with concentration, dizziness, focal weakness and headaches.  Psychiatric/Behavioral: Positive for depression (and anxiety).  Negative for altered mental status and suicidal ideas.  All other systems reviewed and are negative.      Objective:     Blood pressure (!) 146/80, pulse 73, height 5\' 1"  (1.549 m), weight 230 lb 1.6 oz (104.4 kg), SpO2 96 %.  Cardiac studies:  EKG 11/27/2018: Normal sinus rhythm at 70 bpm, normal axis, nonspecific ST and T wave changes in inferior leads, cannot exclude ischemia. Compared to previous EKG 05/01/2018, ST and T wave changes are more prominent in leads V5 and V6.   Echocardiogram 02/17/2016: Left ventricle cavity is normal in size. Mild concentric hypertrophy of the left ventricle. Normal global wall motion. Normal diastolic filling pattern. Calculated EF 72%. Left atrial cavity is normal in size. Aneurysmal interatrial septum with probable small incidental PFO. Trace mitral regurgitation. Trace tricuspid regurgitation. Unable to estimate PA pressure due to absence/minimal TR signal.  Exercise sestamibi stress test 02/21/2016: 1. Resting EKG demonstrates normal sinus rhythm, normal axis, nonspecific inferior and lateral ST segment depression with T-wave inversion. Cannot exclude ischemia. Stress EKG was equivocal for ischemia with PVCs noted during peak exercise but no additional ST-T wave changes of ischemia. Patient exercised on a Bruce protocol for 4 minutes and 48 seconds and achieved 6.74 METS. Stress terminated due to fatigue. Normal blood  pressure response. Low exercise tolerance for age. 2. Raw images reveal prominent breast attenuation. The perfusion imaging study demonstrates the left ventricle to be normal in both rest and stress images. There is a small sized mild ischemia in the basal anterior and anteroseptal region and basal to mid inferior wall. Left ventricular systolic function calculated QGS was mildly depressed at 47%. This is an intermediate risk study, clinical correlation recommended.   Recent Labs:  Lipid Panel     Component Value Date/Time   CHOL 123 09/26/2017 1456   TRIG 170.0 (H) 09/26/2017 1456   HDL 38.50 (L) 09/26/2017 1456   CHOLHDL 3 09/26/2017 1456   VLDL 34.0 09/26/2017 1456   LDLCALC 50 09/26/2017 1456   CMP Latest Ref Rng & Units 10/30/2017  Glucose 65 - 99 mg/dL 97  BUN 6 - 20 mg/dL 5(L)  Creatinine 0.44 - 1.00 mg/dL 0.79  Sodium 135 - 145 mmol/L 141  Potassium 3.5 - 5.1 mmol/L 3.6  Chloride 101 - 111 mmol/L 103  CO2 22 - 32 mmol/L 27  Calcium 8.9 - 10.3 mg/dL 9.4  Total Protein 6.5 - 8.1 g/dL -  Total Bilirubin 0.3 - 1.2 mg/dL -  Alkaline Phos 38 - 126 U/L -  AST 15 - 41 U/L -  ALT 14 - 54 U/L -   CBC Latest Ref Rng & Units 10/30/2017  WBC 4.0 - 10.5 K/uL 10.5  Hemoglobin 12.0 - 15.0 g/dL 13.3  Hematocrit 36.0 - 46.0 % 40.9  Platelets 150 - 400 K/uL 376   01/14/2016: HbA1c 6.3%, vitamin D 37.07  08/20/2015: TSH 1.79  Physical Exam  Constitutional: She appears well-developed and well-nourished. No distress.  Morbidly obese  HENT:  Head: Atraumatic.  Eyes: Conjunctivae are normal.  Neck: Neck supple. No JVD present. No thyromegaly present.  Short neck and difficult to evaluate JVP  Cardiovascular: Normal rate, regular rhythm and intact distal pulses. Exam reveals no gallop.  Murmur heard.  Early systolic murmur is present with a grade of 1/6. Aortic Area - Radiates to carotids Pulses:      Carotid pulses are on the right side with bruit and 2+ on the left side.  Dorsalis pedis pulses are 2+ on the right side and 2+ on the left side.       Posterior tibial pulses are 2+ on the right side and 2+ on the left side.  Femoral and popliteal pulse difficult to feel due to patient's body habitus.   Carotid arteries - Right-Soft Bruit(conducted sound from aortic SEM  Pulmonary/Chest: Effort normal and breath sounds normal.  Abdominal: Soft. Bowel sounds are normal.  Musculoskeletal: Normal range of motion.        General: No edema.  Neurological: She is alert.  Skin: Skin is warm and dry.  Psychiatric: She has a normal mood and affect.       Assessment & Recommendations:  1. Essential hypertension Blood pressure continues to be mildly elevated, but improved from previous. Had leg swelling with amlodipine. Suspect will improve with weight loss.   2. Dyspnea on exertion Chronic and unchanged. Likely related to obesity and inactivity.   3. Abnormal EKG Slightly more prominent ST andT wave flattening and inversion in lateral leads. Asymptomatic at this time. Suspect related to hypertension. Will continue to monitor.   4. Morbid obesity ([place]) Unfortunately she continues to gain weight despite multiple discussions regarding weight loss. Advised that hypertension, shortness of breath, and lipids would all likely improve with weight loss. Diet modifications and regular exercise were again stressed. She is interested in weight loss programs and possible medical therapy. I will refer her to weight loss center for evaluation and management.  Plan: I will see her back in 3 months for follow up on hypertension and compliance with weight loss.    Jeri Lager, MSN, APRN, FNP-C [facility] [place] Office: [phone] Fax: [phone]

## 2018-11-26 ENCOUNTER — Encounter: Payer: Federal, State, Local not specified - PPO | Admitting: Internal Medicine

## 2018-11-27 ENCOUNTER — Ambulatory Visit: Payer: Federal, State, Local not specified - PPO | Admitting: Cardiology

## 2018-11-27 ENCOUNTER — Other Ambulatory Visit: Payer: Self-pay

## 2018-11-27 VITALS — BP 146/80 | HR 73 | Ht 61.0 in | Wt 230.1 lb

## 2018-11-27 DIAGNOSIS — R9431 Abnormal electrocardiogram [ECG] [EKG]: Secondary | ICD-10-CM

## 2018-11-27 DIAGNOSIS — R0600 Dyspnea, unspecified: Secondary | ICD-10-CM

## 2018-11-27 DIAGNOSIS — I10 Essential (primary) hypertension: Secondary | ICD-10-CM | POA: Diagnosis not present

## 2018-11-27 DIAGNOSIS — R0609 Other forms of dyspnea: Secondary | ICD-10-CM

## 2018-11-28 ENCOUNTER — Encounter: Payer: Self-pay | Admitting: Cardiology

## 2018-12-02 ENCOUNTER — Other Ambulatory Visit: Payer: Self-pay

## 2018-12-02 ENCOUNTER — Telehealth: Payer: Self-pay | Admitting: Internal Medicine

## 2018-12-02 ENCOUNTER — Other Ambulatory Visit: Payer: Self-pay | Admitting: Cardiology

## 2018-12-02 MED ORDER — PROPRANOLOL HCL ER 160 MG PO CP24: 160.0000 mg | ORAL_CAPSULE | Freq: Every day | ORAL | 2 refills | Status: DC

## 2018-12-03 NOTE — Telephone Encounter (Signed)
Pt had left v/m that she was out of buspar. I spoke with Alyse Low at [facility] and pt picked up buspar 11/24/18. I spoke with pt and she will look at home for new bottle of buspar. Pt will cb if needed.

## 2018-12-04 ENCOUNTER — Other Ambulatory Visit: Payer: Self-pay | Admitting: Internal Medicine

## 2018-12-04 DIAGNOSIS — L2089 Other atopic dermatitis: Secondary | ICD-10-CM | POA: Diagnosis not present

## 2018-12-04 DIAGNOSIS — L83 Acanthosis nigricans: Secondary | ICD-10-CM | POA: Diagnosis not present

## 2018-12-04 DIAGNOSIS — L819 Disorder of pigmentation, unspecified: Secondary | ICD-10-CM | POA: Diagnosis not present

## 2018-12-04 MED ORDER — BUSPIRONE HCL 5 MG PO TABS: 5.0000 mg | ORAL_TABLET | Freq: Every day | ORAL | 0 refills | Status: DC

## 2018-12-04 NOTE — Telephone Encounter (Signed)
Pt stated she look'd for her buspar but couldn't find it. Pt need 30 day new prescription. Please advise. Pt is out of medication

## 2018-12-04 NOTE — Telephone Encounter (Signed)
Rx sent through [facility]  

## 2018-12-04 NOTE — Addendum Note (Signed)
Addended by: Lurlean Nanny on: 12/04/2018 04:46 PM   Modules accepted: Orders

## 2018-12-05 ENCOUNTER — Other Ambulatory Visit: Payer: Self-pay | Admitting: Internal Medicine

## 2018-12-06 ENCOUNTER — Ambulatory Visit: Payer: Federal, State, Local not specified - PPO | Admitting: Internal Medicine

## 2018-12-07 ENCOUNTER — Other Ambulatory Visit: Payer: Self-pay | Admitting: Cardiology

## 2018-12-25 ENCOUNTER — Other Ambulatory Visit: Payer: Self-pay | Admitting: Internal Medicine

## 2019-01-02 ENCOUNTER — Other Ambulatory Visit: Payer: Self-pay | Admitting: Cardiology

## 2019-01-08 ENCOUNTER — Encounter: Payer: Federal, State, Local not specified - PPO | Admitting: Internal Medicine

## 2019-01-24 ENCOUNTER — Other Ambulatory Visit: Payer: Self-pay | Admitting: Cardiology

## 2019-01-24 MED ORDER — VALSARTAN 320 MG PO TABS: 320.0000 mg | ORAL_TABLET | Freq: Every day | ORAL | 1 refills | Status: DC

## 2019-02-07 ENCOUNTER — Other Ambulatory Visit: Payer: Self-pay | Admitting: Cardiology

## 2019-03-03 ENCOUNTER — Other Ambulatory Visit: Payer: Self-pay | Admitting: Internal Medicine

## 2019-03-05 ENCOUNTER — Encounter: Payer: Self-pay | Admitting: Cardiology

## 2019-03-05 ENCOUNTER — Other Ambulatory Visit: Payer: Self-pay

## 2019-03-05 ENCOUNTER — Ambulatory Visit: Payer: Federal, State, Local not specified - PPO | Admitting: Cardiology

## 2019-03-05 VITALS — BP 140/77 | HR 68 | Ht 61.0 in | Wt 230.0 lb

## 2019-03-05 DIAGNOSIS — I10 Essential (primary) hypertension: Secondary | ICD-10-CM

## 2019-03-05 DIAGNOSIS — R0609 Other forms of dyspnea: Secondary | ICD-10-CM

## 2019-03-05 DIAGNOSIS — R0600 Dyspnea, unspecified: Secondary | ICD-10-CM

## 2019-03-05 MED ORDER — SPIRONOLACTONE 25 MG PO TABS: 25.0000 mg | ORAL_TABLET | Freq: Every day | ORAL | 1 refills | Status: DC

## 2019-03-05 NOTE — Progress Notes (Signed)
Primary Physician:  [name], NP   Patient ID: Debbie Walter, female    DOB: Sep 29, 1980, 38 y.o.   MRN: [id number]  Subjective:    Chief Complaint  Patient presents with  . Hypertension    3 month f/u    This visit type was conducted due to national recommendations for restrictions regarding the COVID-19 Pandemic (e.g. social distancing).  This format is felt to be most appropriate for this patient at this time.  All issues noted in this document were discussed and addressed.  No physical exam was performed (except for noted visual exam findings with Telehealth visits).  The patient has consented to conduct a Telehealth visit and understands insurance will be billed.   I discussed the limitations of evaluation and management by telemedicine and the availability of in person appointments. The patient expressed understanding and agreed to proceed.  Virtual Visit via Video Note is as below  I connected with@, on 03/05/2019 at 1400 by a video enabled telemedicine application and verified that I am speaking with the correct person using two identifiers.     I have discussed with her regarding the safety during COVID Pandemic and steps and precautions including social distancing with the patient.    HPI: Debbie Walter  is a 38 y.o. female  with hypertension, morbid obesity, polycystic ovarian syndrome, severe anxiety, and insulin resistance.  Presents today for 3 month office visit follow-up for hypertension. Patient has had difficulty with controlling her blood pressure for the last several years and has had negative renal Dopplers in the past. Denies chest pain, PND, orthopnea, palpitations, syncopal episodes, or symptoms of claudication or TIA. Has chronic shortness of breath on exertion that is stable. She had leg edema with amlodipine, but has now resolved with stopping the medication. Blood pressure has improved.  Patient has not been able to lose weight despite making some  diet changes. She does admit that she hasn't been very strict about her diet, and could definitely do better. She has not been exercising either.    States that she has now been working from home. She continues to be separated from her husband and is dealing with the stress from her personal life.  Past Medical History:  Diagnosis Date  . Fibroids   . GERD (gastroesophageal reflux disease)    takes protonix  . History of kidney stones   . Hypertension   . MRSA infection    under arm  . Panic attacks   . PCOS (polycystic ovarian syndrome)    takes metformin for this    Past Surgical History:  Procedure Laterality Date  . CESAREAN SECTION    . CESAREAN SECTION WITH BILATERAL TUBAL LIGATION Bilateral 05/15/2014   Procedure: CESAREAN SECTION WITH BILATERAL TUBAL LIGATION;  Surgeon: Cyril Mourning, MD;  Location: [facility];  Service: Obstetrics;  Laterality: Bilateral;  repeat  edc 06/10/14  . CHOLECYSTECTOMY N/A 10/31/2017   Procedure: LAPAROSCOPIC CHOLECYSTECTOMY;  Surgeon: Coralie Keens, MD;  Location: [facility];  Service: General;  Laterality: N/A;  . HYSTEROSCOPY    . PARATHYROIDECTOMY     REMOVED PART OF THYROID   . PARATHYROIDECTOMY      Social History   Socioeconomic History  . Marital status: Married    Spouse name: Not on file  . Number of children: Not on file  . Years of education: Not on file  . Highest education level: Not on file  Occupational History  . Not on file  Social Needs  . Financial resource strain: Not on file  . Food insecurity    Worry: Not on file    Inability: Not on file  . Transportation needs    Medical: Not on file    Non-medical: Not on file  Tobacco Use  . Smoking status: Never Smoker  . Smokeless tobacco: Never Used  Substance and Sexual Activity  . Alcohol use: No    Alcohol/week: 0.0 standard drinks  . Drug use: No  . Sexual activity: Yes    Birth control/protection: None  Lifestyle  . Physical activity    Days per week: Not  on file    Minutes per session: Not on file  . Stress: Not on file  Relationships  . Social Herbalist on phone: Not on file    Gets together: Not on file    Attends religious service: Not on file    Active member of club or organization: Not on file    Attends meetings of clubs or organizations: Not on file    Relationship status: Not on file  . Intimate partner violence    Fear of current or ex partner: Not on file    Emotionally abused: Not on file    Physically abused: Not on file    Forced sexual activity: Not on file  Other Topics Concern  . Not on file  Social History Narrative  . Not on file    Review of Systems  Constitution: Negative for decreased appetite, malaise/fatigue, weight gain and weight loss.  Eyes: Negative for visual disturbance.  Cardiovascular: Positive for dyspnea on exertion. Negative for chest pain, claudication, leg swelling, orthopnea, palpitations and syncope.  Respiratory: Negative for hemoptysis and wheezing.   Endocrine: Negative for cold intolerance and heat intolerance.  Hematologic/Lymphatic: Does not bruise/bleed easily.  Skin: Negative for nail changes.  Musculoskeletal: Negative for muscle weakness and myalgias.  Gastrointestinal: Negative for abdominal pain, change in bowel habit, nausea and vomiting.  Neurological: Negative for difficulty with concentration, dizziness, focal weakness and headaches.  Psychiatric/Behavioral: Positive for depression (and anxiety). Negative for altered mental status and suicidal ideas.  All other systems reviewed and are negative.     Objective:  Blood pressure 140/77, pulse 68, height 5\' 1"  (1.549 m), weight 230 lb (104.3 kg). Body mass index is 43.46 kg/m.    Physical exam not performed or limited due to virtual visit.  Patient appeared to be in no distress, Neck was supple, respiration was not labored.  Please see exam details from prior visit is as below.   Physical Exam   Constitutional: She appears well-developed and well-nourished. No distress.  Morbidly obese  HENT:  Head: Atraumatic.  Eyes: Conjunctivae are normal.  Neck: Neck supple. No JVD present. No thyromegaly present.  Short neck and difficult to evaluate JVP  Cardiovascular: Normal rate, regular rhythm and intact distal pulses. Exam reveals no gallop.  Murmur heard.  Early systolic murmur is present with a grade of 1/6. Aortic Area - Radiates to carotids Pulses:      Carotid pulses are on the right side with bruit and 2+ on the left side.      Dorsalis pedis pulses are 2+ on the right side and 2+ on the left side.       Posterior tibial pulses are 2+ on the right side and 2+ on the left side.  Femoral and popliteal pulse difficult to feel due to patient's body habitus.   Carotid arteries -  Right-Soft Bruit(conducted sound from aortic SEM  Pulmonary/Chest: Effort normal and breath sounds normal.  Abdominal: Soft. Bowel sounds are normal.  Musculoskeletal: Normal range of motion.        General: No edema.  Neurological: She is alert.  Skin: Skin is warm and dry.  Psychiatric: She has a normal mood and affect.   Radiology: No results found.  Laboratory examination:    CMP Latest Ref Rng & Units 10/30/2017 10/19/2017 09/26/2017  Glucose 65 - 99 mg/dL 97 101(H) -  BUN 6 - 20 mg/dL 5(L) 14 -  Creatinine 0.44 - 1.00 mg/dL 0.79 0.60 -  Sodium 135 - 145 mmol/L 141 138 -  Potassium 3.5 - 5.1 mmol/L 3.6 3.3(L) 3.5  Chloride 101 - 111 mmol/L 103 103 -  CO2 22 - 32 mmol/L 27 26 -  Calcium 8.9 - 10.3 mg/dL 9.4 8.7(L) -  Total Protein 6.5 - 8.1 g/dL - 7.3 -  Total Bilirubin 0.3 - 1.2 mg/dL - 0.5 -  Alkaline Phos 38 - 126 U/L - 57 -  AST 15 - 41 U/L - 22 -  ALT 14 - 54 U/L - 27 -   CBC Latest Ref Rng & Units 10/30/2017 10/19/2017 01/12/2017  WBC 4.0 - 10.5 K/uL 10.5 11.9(H) -  Hemoglobin 12.0 - 15.0 g/dL 13.3 14.2 15.0  Hematocrit 36.0 - 46.0 % 40.9 42.4 44.0  Platelets 150 - 400 K/uL 376 323 -    Lipid Panel     Component Value Date/Time   CHOL 123 09/26/2017 1456   TRIG 170.0 (H) 09/26/2017 1456   HDL 38.50 (L) 09/26/2017 1456   CHOLHDL 3 09/26/2017 1456   VLDL 34.0 09/26/2017 1456   LDLCALC 50 09/26/2017 1456   HEMOGLOBIN A1C Lab Results  Component Value Date   HGBA1C 6.0 02/08/2017   TSH No results for input(s): TSH in the last 8760 hours.  PRN Meds:. Medications Discontinued During This Encounter  Medication Reason  . chlorpheniramine-HYDROcodone (TUSSIONEX PENNKINETIC ER) 10-8 MG/5ML SUER Error  . albuterol (PROVENTIL) (5 MG/ML) 0.5% nebulizer solution Error  . fluticasone (FLONASE ALLERGY RELIEF) 50 MCG/ACT nasal spray Error  . KLOR-CON M20 20 MEQ tablet Error  . oxyCODONE (OXY IR/ROXICODONE) 5 MG immediate release tablet Error  . spironolactone (ALDACTONE) 25 MG tablet Dose change  . potassium chloride SA (K-DUR,KLOR-CON) 20 MEQ tablet Discontinued by provider   Current Meds  Medication Sig  . ALPRAZolam (XANAX) 0.25 MG tablet Take 1 tablet (0.25 mg total) by mouth daily as needed.  Marland Kitchen aspirin EC 81 MG tablet Take 81 mg by mouth daily.  . busPIRone (BUSPAR) 5 MG tablet Take 1 tablet (5 mg total) by mouth daily.  . cholecalciferol (VITAMIN D) 1000 UNITS tablet Take 1,000 Units by mouth daily.   . cyanocobalamin 500 MCG tablet Take 1 tablet (500 mcg total) by mouth daily.  Marland Kitchen FLUoxetine (PROZAC) 10 MG tablet TAKE 1 TABLET BY MOUTH EVERY DAY  . metFORMIN (GLUCOPHAGE) 500 MG tablet Take 1 tablet (500 mg total) by mouth 2 (two) times daily with a meal.  . nitroGLYCERIN (NITROSTAT) 0.4 MG SL tablet DISSOLVE 1 TABLET UNDER THE TONUGE EVERY MINUTE AS NEEDED FOR CHEST PAIN  . Omega-3 Fatty Acids (FISH OIL) 1000 MG CAPS Take 1 capsule by mouth daily.  . pantoprazole (PROTONIX) 40 MG tablet TAKE 1 TABLET BY MOUTH EVERY DAY  . Prenat w/o A-FeCbGl-DSS-FA-DHA (CITRANATAL ASSURE) 35-1 & 300 MG tablet Take 1 tablet by mouth daily.  . Probiotic Product (PROBIOTIC  DAILY PO)  Take 1 capsule by mouth daily.  . propranolol ER (INDERAL LA) 160 MG SR capsule Take 1 capsule (160 mg total) by mouth daily.  . simvastatin (ZOCOR) 20 MG tablet Take 20 mg by mouth at bedtime.  . valsartan (DIOVAN) 320 MG tablet TAKE 1 TABLET BY MOUTH EVERY DAY  . [DISCONTINUED] spironolactone (ALDACTONE) 25 MG tablet TAKE 1 (ONE) TABLET DAILY IN THE MORNING    Cardiac Studies:   Echocardiogram 02/17/2016: Left ventricle cavity is normal in size. Mild concentric hypertrophy of the left ventricle. Normal global wall motion. Normal diastolic filling pattern. Calculated EF 72%. Left atrial cavity is normal in size. Aneurysmal interatrial septum with probable small incidental PFO. Trace mitral regurgitation. Trace tricuspid regurgitation. Unable to estimate PA pressure due to absence/minimal TR signal.  Exercise sestamibi stress test 02/21/2016: 1. Resting EKG demonstrates normal sinus rhythm, normal axis, nonspecific inferior and lateral ST segment depression with T-wave inversion. Cannot exclude ischemia. Stress EKG was equivocal for ischemia with PVCs noted during peak exercise but no additional ST-T wave changes of ischemia. Patient exercised on a Bruce protocol for 4 minutes and 48 seconds and achieved 6.74 METS. Stress terminated due to fatigue. Normal blood pressure response. Low exercise tolerance for age. 2. Raw images reveal prominent breast attenuation. The perfusion imaging study demonstrates the left ventricle to be normal in both rest and stress images. There is a small sized mild ischemia in the basal anterior and anteroseptal region and basal to mid inferior wall. Left ventricular systolic function calculated QGS was mildly depressed at 47%. This is an intermediate risk study, clinical correlation recommended.  Assessment:     ICD-10-CM   1. Essential hypertension  I10   2. Dyspnea on exertion  R06.09   3. Morbid obesity ([place])  E66.01     EKG 11/27/2018: Normal sinus  rhythm at 70 bpm, normal axis, nonspecific ST and T wave changes in inferior leads, cannot exclude ischemia. Compared to previous EKG 05/01/2018, ST and T wave changes are more prominent in leads V5 and V6.   Recommendations:   Hypertension has remained stable with current medications, will continue the same. She has dyspnea on exertion that is chronic and stable, likely contributed by her weight and hypertensive heart disease. On previous EKG, she has EKG abnormalities likely related to hypertension. Will consider repeating echocardiogram at her next visit as she has not had one in the last several years given her long history of hypertension.   Continue to feel that her obesity is contributing to the majority of her issues. We are limited on diet medications due to her hypertension and cost. I will make referral to [facility] or possibly bariatric program to help with her weight management. Extensive discussion with the patient on the importance of needed diet modifications and starting regular exercise. I will see her back in 2-3 months for close monitoring of her efforts toward weight loss and hypertension.   Miquel Dunn, MSN, APRN, FNP-C [facility]. [place] Office: [phone] Fax: [phone]

## 2019-03-06 MED ORDER — SPIRONOLACTONE 50 MG PO TABS: 50.0000 mg | ORAL_TABLET | Freq: Every day | ORAL | 6 refills | Status: DC

## 2019-03-07 ENCOUNTER — Encounter: Payer: Self-pay | Admitting: Cardiology

## 2019-03-07 ENCOUNTER — Telehealth: Payer: Self-pay | Admitting: Internal Medicine

## 2019-03-07 ENCOUNTER — Other Ambulatory Visit: Payer: Self-pay

## 2019-03-07 NOTE — Telephone Encounter (Signed)
Medication: pantoprazole (PROTONIX) 40 MG tablet [[id number]]   Has the patient contacted their pharmacy? Yes (Agent: If no, request that the patient contact the pharmacy for the refill.) (Agent: If yes, when and what did the pharmacy advise?)  Preferred Pharmacy (with phone number or street name): [id number] - [place], [name] - [address] [name] [address] Phone: [phone] Fax: [phone]    Agent: Please be advised that RX refills may take up to 3 business days. We ask that you follow-up with your pharmacy.

## 2019-03-10 NOTE — Telephone Encounter (Signed)
This Rx was last filled 03/04/2019  Sent to pharmacy as: pantoprazole (PROTONIX) 40 MG tablet   E-Prescribing Status: Receipt confirmed by pharmacy (03/04/2019 9:55 PM EDT)

## 2019-03-13 ENCOUNTER — Other Ambulatory Visit: Payer: Self-pay | Admitting: Cardiology

## 2019-03-17 ENCOUNTER — Other Ambulatory Visit: Payer: Self-pay | Admitting: Cardiology

## 2019-03-20 ENCOUNTER — Encounter: Payer: Self-pay | Admitting: Internal Medicine

## 2019-03-20 ENCOUNTER — Ambulatory Visit (INDEPENDENT_AMBULATORY_CARE_PROVIDER_SITE_OTHER): Payer: Federal, State, Local not specified - PPO | Admitting: Internal Medicine

## 2019-03-20 ENCOUNTER — Other Ambulatory Visit: Payer: Self-pay

## 2019-03-20 DIAGNOSIS — E042 Nontoxic multinodular goiter: Secondary | ICD-10-CM | POA: Diagnosis not present

## 2019-03-20 DIAGNOSIS — R825 Elevated urine levels of drugs, medicaments and biological substances: Secondary | ICD-10-CM

## 2019-03-20 DIAGNOSIS — E282 Polycystic ovarian syndrome: Secondary | ICD-10-CM | POA: Diagnosis not present

## 2019-03-20 DIAGNOSIS — Z8639 Personal history of other endocrine, nutritional and metabolic disease: Secondary | ICD-10-CM

## 2019-03-20 NOTE — Progress Notes (Signed)
Patient ID: Debbie Walter, female   DOB: 1981/06/07, 39 y.o.   MRN: [id number]   Patient location: Home My location: Office  I connected with the patient on 03/20/19 at  3:00 PM EDT by a video enabled telemedicine application and verified that I am speaking with the correct person.   I discussed the limitations of evaluation and management by telemedicine and the availability of in person appointments. The patient expressed understanding and agreed to proceed.   Details of the encounter are shown below.  HPI  Debbie Walter is a 38 y.o.-year-old female, initially referred by Dr. Elijio Miles, for management of elevated metanephrines, hypercalcemia, elevated HbA1c, thyroid nodules. Last visit 2 years and 2 months ago.  She canceled 5 appointments since last visit. PCP: [name], NP  Patient has history of anxiety and she is back on BuSpar now.  She feels that this is working well for her.  She also has a history of high blood pressure, currently on: - Valsartan/HCTZ 320/25 increased - Metoprolol changed to Propranolol 160 daily  She was on: - Amlodipine 5 mg daily at night >> now off due to leg swelling  Now on: - Spironolactone 50 mg daily  On this regimen >> BP 140/80.  Elevated urinary metanephrines: Reviewed and addended history:  Pt. has been dx with hypertension dx at 50 (2003), had preeclampsia with her last pregnancy- was on Alpha methyl-dopa >> BP controlled then. This med did not help for the second pregnancy >> was on Metoprolol and Verapamil. She was on:In 2015,  - Metoprolol 50 mg bid - has been on this for years - Valsartan 160 mg daily - Chlorthalidone 25 mg daily She was on Amlodipine 5 mg daily >> dropped BP too low and developed dizziness.  During investigation for hypertension, she had a 24-hour urine fractionated metanephrine level checked in 07/20/2014 and the metanephrines returned slightly elevated as shown below. Patient mentions that she was still  drinking caffeinated drinks and wine cooler during the period of the collection: Total volume 2.6 L Metanephrine 233 g per 24 hour (36-190) Normetanephrine 382 (35-482) Total metanephrines 614 (176-160).  Of note, at that time, she had the PRA 0.44 (0.25-5.8) with a potassium of 4.1. The test was run at [facility].  She had a repeat 24-hour urine for fractionated metanephrines, which again showed a slightly elevated metanephrine, practically unchanged from prior, despite being off caffeine and alcohol (not on Buspar yet):  Total volume 1.9 L Metanephrine 238 g per 24 hour (36-190) Normetanephrine 282 (35-482) Total metanephrines 520 (737-106).   She had 2 abdominal CT scans, in 2009 and 2014 and there were no adrenal masses.  In 12/2014 she had normal plasma metanephrines, normal urinary catecholamines, slightly elevated urinary metanephrines on BuSpar, which persisted in 07/2015 off BuSpar: Component     Latest Ref Rng & Units 01/07/2015 (On BuSpar) 07/26/2015  Total Volume - CF 24Hr U     mL 1,500   Epinephrine, 24 hr Urine      undetectably low   Norepinephrine, 24 hr Ur     15 - 100 mcg/24 h 55   Calculated Total (E+NE)     26 - 121 mcg/24 h 55   Dopamine, 24 hr Urine     52 - 480 mcg/24 h 394   Creatinine, Urine mg/day-CATEUR     0.63 - 2.50 g/24 h 1.70   Metanephrine, Pl     <=57 pg/mL 45   Normetanephrine, Pl     <=  148 pg/mL 47   Total Metanephrines-Plasma     <=205 pg/mL 92   Metanephrines, Ur     36 - 190 mcg/24 h 280 (H) 250 (H)  Normetanephr.,U,24h     35 - 482 mcg/24 h 287 285  Metanephrine, Ur     115 - 695 mcg/24 h 567 535  Creatinine, Urine     mg/dL 123.2   Creatinine, 24H Ur     700 - 1,800 mg/day 1,848 (H)    Due to persistently elevated metanephrines, we checked an MIBG scan (09/10/2015) (she had to pay 1400$ out of pocket!) >>  Negative for pheochromocytoma or paraganglioma:  No abnormal accumulation of radiotracer on the whole-body  planar imaging. Physiologic uptake is noted in the liver and to lesser degree lungs and GI tract.  Focal activity is noted in the LEFT neck. Dedicated planar and SPECT imaging of the neck demonstrate this activity to localize to the blocked LEFT lobe of thyroid gland. Activity in blocked thyroid glands can be seen in a minority of cases. Presumed RIGHT hemi thyroidectomy in the past.  IMPRESSION: 1. No evidence of pheochromocytoma on whole-body MIBG scan. 2. 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  Thyroid nodules:  On her MIBG scan, there was increased activity in the left lobe of the thyroid.  Therefore, we checked a thyroid ultrasound >> only small subcentimeter nodules, not worrisome:  Thyroid ultrasound (10/01/2015):   Right thyroid lobe: Surgically absent  Left thyroid lobe: 5.9 x 1.6 x 2.5 cm. Homogeneous left thyroid gland echotexture. Small incidental hypoechoic left mid and lower pole nodules (3) all measuring 7 mm or less in size. These remain nonspecific.  Isthmus Thickness: 3 mm. No nodules visualized.  Lymphadenopathy: None visualized.  IMPRESSION: Previous right thyroidectomy. No abnormality demonstrated by ultrasound in the right thyroid bed. Incidental sub cm left mid and lower pole nodules (3), all measuring 7 mm or less in size. No adenopathy  Pt denies: - feeling nodules in neck - hoarseness - dysphagia - choking - SOB with lying down  TFTs have been normal: Lab Results  Component Value Date   TSH 1.79 08/20/2015   TSH 1.960 12/02/2014   TSH 0.943 11/11/2013   TSH 1.435 10/22/2007   No FH of thyroid cancer. FH with HTN: M,F,S. No pituitary tumor FH.  Hyperparathyroidism/hypercalcemia:  Reviewed history: Patient describes that she has a history of R parathyroidectomy at 38 y/o. She had HTN then. She saw endo then >> Dr Windell Norfolk >> had low calcium then >> anxiety >> started to take calcium. She was on calcium 600-400 mg-U  every other day (switched after calcium 10.7 on 07/16/2014). Calcium was 11 on 09/03/2014.   She had a kidney stone and had lithotripsy in 2014.  I reviewed her pertinent labs: Lab Results  Component Value Date   PTH 48 02/08/2017   PTH 53 07/16/2015   PTH Comment 07/16/2015   PTH 57 12/02/2014   CALCIUM 9.4 10/30/2017   CALCIUM 8.7 (L) 10/19/2017   CALCIUM 9.7 02/08/2017   CALCIUM 9.6 09/01/2016   CALCIUM 10.3 01/18/2016   CALCIUM 10.7 (H) 01/14/2016   CALCIUM 10.3 10/18/2015   CALCIUM 10.1 08/20/2015   CALCIUM 10.4 (H) 07/16/2015   CALCIUM 10.4 02/26/2015   12/15/2014:  Calcium 10.9 (8.4-10.5) - took Tums before the test!, magnesium 1.8 (1.5-2.5), BUN/creatinine 10/0.63  04/27/2015: Calcium 11.2 (8.7-10.2)  06/09/2015:  Ionized calcium: 5.2 (4.5-5.6), calcium 9.8 (8.7-10.2), magnesium 2.1 (1.6-2.3), potassium 3.4 (  3.5-5.2). She is on vit D 1000 units daily.  She had a normal 24-hour urine calcium: Component     Latest Ref Rng 07/26/2015          Creatinine, Urine     20 - 320 mg/dL 88  Creatinine, 24H Ur     0.63 - 2.50 g/24 h 2.29  Calcium, Ur     Not estab mg/dL 7  Calcium, 24 hour urine     35 - 250 mg/24 h 182   She is on 1000 units vitamin D daily.  Latest vitamin D level was normal: Lab Results  Component Value Date   VD25OH 34.57 02/08/2017   VD25OH 37.07 01/14/2016   VD25OH 39.42 07/16/2015   VD25OH 39.36 12/02/2014   VD25OH 60 11/11/2013   Prediabetes:  She has a diagnosis of PCOS.  She continues on metformin 500 mg twice a day, tolerated well. Her testosterone level was not elevated in the past.  She is not on OCPs.  She had tubal ligation in the past.  Not on OCPs. She had tubal ligation in the past.  Reviewed HbA1c levels: Lab Results  Component Value Date   HGBA1C 6.0 02/08/2017   HGBA1C 6.0 09/01/2016   HGBA1C 6.3 01/14/2016   She is still contemplating sleeve gastrectomy.  She was found wit have a B12 deficiency >> started B12  po.  ROS: Constitutional: + weight gain/no weight loss, no fatigue, no subjective hyperthermia, no subjective hypothermia Eyes: no blurry vision, no xerophthalmia ENT: no sore throat, no nodules palpated in neck, no dysphagia, no odynophagia, no hoarseness Cardiovascular: no CP/+ exertional SOB/no palpitations/no leg swelling Respiratory: no cough/+ SOB/no wheezing Gastrointestinal: no N/no V/no D/no C/no acid reflux Musculoskeletal: no muscle aches/no joint aches Skin: no rashes, no hair loss Neurological: no tremors/+ numbness/+ tingling/no dizziness + anxiety  I reviewed pt's medications, allergies, PMH, social hx, family hx, and changes were documented in the history of present illness. Otherwise, unchanged from my initial visit note.   Past Medical History:  Diagnosis Date  . Fibroids   . GERD (gastroesophageal reflux disease)    takes protonix  . History of kidney stones   . Hypertension   . MRSA infection    under arm  . Panic attacks   . PCOS (polycystic ovarian syndrome)    takes metformin for this   Past Surgical History:  Procedure Laterality Date  . CESAREAN SECTION    . CESAREAN SECTION WITH BILATERAL TUBAL LIGATION Bilateral 05/15/2014   Procedure: CESAREAN SECTION WITH BILATERAL TUBAL LIGATION;  Surgeon: Cyril Mourning, MD;  Location: [facility];  Service: Obstetrics;  Laterality: Bilateral;  repeat  edc 06/10/14  . CHOLECYSTECTOMY N/A 10/31/2017   Procedure: LAPAROSCOPIC CHOLECYSTECTOMY;  Surgeon: Coralie Keens, MD;  Location: [facility];  Service: General;  Laterality: N/A;  . HYSTEROSCOPY    . PARATHYROIDECTOMY     REMOVED PART OF THYROID   . PARATHYROIDECTOMY     History   Social History  . Marital Status: Married    Spouse Name: N/A    Number of Children: 2 (4 miscarriages)   Occupational History  . Nurse    Social History Main Topics  . Smoking status: Never Smoker   . Smokeless tobacco: Never Used  . Alcohol Use: No  . Drug Use: No  . Sexual  Activity: Yes   Current Outpatient Medications on File Prior to Visit  Medication Sig Dispense Refill  . ALPRAZolam (XANAX) 0.25 MG tablet Take 1  tablet (0.25 mg total) by mouth daily as needed. 30 tablet 0  . amLODipine (NORVASC) 5 MG tablet Take by mouth.    Marland Kitchen aspirin EC 81 MG tablet Take 81 mg by mouth daily.    . busPIRone (BUSPAR) 5 MG tablet Take 1 tablet (5 mg total) by mouth daily. 30 tablet 0  . cholecalciferol (VITAMIN D) 1000 UNITS tablet Take 1,000 Units by mouth daily.     . cyanocobalamin 500 MCG tablet Take 1 tablet (500 mcg total) by mouth daily.    Marland Kitchen FLUoxetine (PROZAC) 10 MG tablet TAKE 1 TABLET BY MOUTH EVERY DAY 90 tablet 0  . metFORMIN (GLUCOPHAGE) 500 MG tablet Take 1 tablet (500 mg total) by mouth 2 (two) times daily with a meal. 180 tablet 3  . metoCLOPramide (REGLAN) 10 MG tablet Take 1 tablet (10 mg total) by mouth every 8 (eight) hours as needed (headache or nausea). (Patient not taking: Reported on 11/27/2018) 30 tablet 0  . nitroGLYCERIN (NITROSTAT) 0.4 MG SL tablet DISSOLVE 1 TABLET UNDER THE TONUGE EVERY MINUTE AS NEEDED FOR CHEST PAIN  1  . Omega-3 Fatty Acids (FISH OIL) 1000 MG CAPS Take 1 capsule by mouth daily.    . pantoprazole (PROTONIX) 40 MG tablet TAKE 1 TABLET BY MOUTH EVERY DAY 90 tablet 0  . Prenat w/o A-FeCbGl-DSS-FA-DHA (CITRANATAL ASSURE) 35-1 & 300 MG tablet Take 1 tablet by mouth daily.  11  . Probiotic Product (PROBIOTIC DAILY PO) Take 1 capsule by mouth daily.    . propranolol ER (INDERAL LA) 160 MG SR capsule Take 1 capsule (160 mg total) by mouth daily. 90 capsule 2  . simvastatin (ZOCOR) 20 MG tablet Take 20 mg by mouth at bedtime.  5  . spironolactone (ALDACTONE) 50 MG tablet Take 1 tablet (50 mg total) by mouth daily. 30 tablet 6  . sulfamethoxazole-trimethoprim (BACTRIM DS,SEPTRA DS) 800-160 MG tablet Take 1 tablet by mouth 2 (two) times daily. (Patient not taking: Reported on 11/27/2018) 20 tablet 0  . valsartan (DIOVAN) 320 MG tablet TAKE  1 TABLET BY MOUTH EVERY DAY 90 tablet 1   No current facility-administered medications on file prior to visit.    Allergies  Allergen Reactions  . Procardia [Nifedipine] Other (See Comments)    headache  . Wellbutrin [Bupropion] Other (See Comments)    "Makes my blood pressure go up - I don't ever want to take it again"  . Labetalol Other (See Comments)    "makes me feel like things are crawling through my head"  . Hydromorphone Other (See Comments)    Makes pt feel out of this world. Patient states it needs to be pushed slow, but she can take it.  . Morphine And Related     Needs to be pushed slow or pt feels like she will code  . Sudafed [Pseudoephedrine Hcl]     Heart race / dries out mucus membranes  . Zithromax [Azithromycin] Other (See Comments)    Gi upset.  Diarrhea   Family History  Problem Relation Age of Onset  . Hyperlipidemia Mother   . Heart disease Mother   . Diabetes Mother   . Hypertension Mother   . Hyperlipidemia Father   . Hypertension Father   . Cancer Father        rectal  . Hypertension Sister   . Anesthesia problems Neg Hx   . Hypotension Neg Hx   . Malignant hyperthermia Neg Hx   . Pseudochol deficiency Neg Hx  diabetes in mother  Hypertension in mother and father  Acute MI in mother  Rectal cancer in father  PE: There were no vitals taken for this visit. There is no height or weight on file to calculate BMI. Wt Readings from Last 3 Encounters:  03/05/19 230 lb (104.3 kg)  11/27/18 230 lb 1.6 oz (104.4 kg)  06/13/18 224 lb (101.6 kg)   Constitutional:  in NAD  The physical exam was not performed (virtual visit).  ASSESSMENT: 1. Elevated urinary metanephrines  - Mild - previous abdominal CT scan reports (without contrast): 04/02/2008 and 02/10/2013. Both report normal adrenals and no suspicious abdominal masses.  - MIBG scan from 07/2015: Negative   - The one thing that it's concerning is that she has a history of  parathyroidectomy for primary hyperparathyroidism while very young, 38 y/o. She does not have family history of hypercalcemia, kidney stones, thyroid cancer, renal cell cancer, pheochromocytoma, to point towards an MEN2A syndrome, but this can happen spontaneously.   2. Hypercalcemia - History of right parathyroidectomy for primary hyperparathyroidism  3. PCOS  4.  Thyroid nodules  PLAN:  1. Mildly elevated urinary metanephrines   -Patient with a history of hypertension since she was 38 years old, complicated by preeclampsia with her last pregnancy. -She had mildly elevated urinary metanephrines, initially on BuSpar but persistent even after she came off the medicine.  We checked an MIBG scan and this was negative for pheochromocytoma and paraganglioma.  This was very reassuring and we stopped checking urine test at that point.  We did discuss that if she comes off BuSpar for a longer period of time, we may need to recheck these, but for now, since this is helping her and she just restarted it, we will not recheck them.  2. Hypercalcemia -Please see above -Patient has a history of primary hyperparathyroidism when she was 20 and is now status post right parathyroidectomy and right thyroidectomy -Before last visit, an ionized calcium was normal, and last total calcium was normal in 10/2017 -We will check a calcium, PTH, vitamin D level when she returns to the clinic -Latest levels were all normal at last visit  3. PCOS -She continues on metformin, not on OCPs. -She has regular menstrual cycles -She is still considering sleeve gastrectomy.  I directed her to the [facility] website for more information about this, upon her questioning. - will check an HbA1c when she returns to the clinic  4.  Thyroid nodules -Very small, subcentimeter -She denies neck compression symptoms -We will check a TSH when she returns to the clinic.  Previous levels have been normal.  Orders Placed  This Encounter  Procedures  . Hemoglobin A1c  . PTH, intact and calcium  . VITAMIN D 25 Hydroxy (Vit-D Deficiency, Fractures)  . TSH    Philemon Kingdom, MD PhD [facility]

## 2019-03-23 ENCOUNTER — Other Ambulatory Visit: Payer: Self-pay | Admitting: Internal Medicine

## 2019-03-24 ENCOUNTER — Encounter: Payer: Self-pay | Admitting: Cardiology

## 2019-03-24 ENCOUNTER — Telehealth: Payer: Self-pay | Admitting: Cardiology

## 2019-03-24 NOTE — Telephone Encounter (Signed)
Patient had requested information on coverage for Saxenda. Advised patient that it did not appear that [facility] would be an option due to out of pocket cost. As previously discussed with make referral to [facility] wellness center with Dr. Leafy Ro for help with weight management.

## 2019-03-25 ENCOUNTER — Telehealth: Payer: Self-pay | Admitting: Internal Medicine

## 2019-03-25 ENCOUNTER — Other Ambulatory Visit: Payer: Self-pay | Admitting: Internal Medicine

## 2019-03-25 NOTE — Telephone Encounter (Signed)
fmla paperwork  On cart to be delivered by carrie

## 2019-03-26 ENCOUNTER — Other Ambulatory Visit: Payer: Self-pay

## 2019-03-26 ENCOUNTER — Encounter: Payer: Self-pay | Admitting: Internal Medicine

## 2019-03-26 ENCOUNTER — Ambulatory Visit (INDEPENDENT_AMBULATORY_CARE_PROVIDER_SITE_OTHER): Payer: Federal, State, Local not specified - PPO | Admitting: Internal Medicine

## 2019-03-26 VITALS — BP 120/80 | HR 71 | Temp 98.3°F | Ht 61.0 in | Wt 229.0 lb

## 2019-03-26 DIAGNOSIS — Z0000 Encounter for general adult medical examination without abnormal findings: Secondary | ICD-10-CM

## 2019-03-26 DIAGNOSIS — E213 Hyperparathyroidism, unspecified: Secondary | ICD-10-CM | POA: Diagnosis not present

## 2019-03-26 DIAGNOSIS — F329 Major depressive disorder, single episode, unspecified: Secondary | ICD-10-CM

## 2019-03-26 DIAGNOSIS — I10 Essential (primary) hypertension: Secondary | ICD-10-CM | POA: Diagnosis not present

## 2019-03-26 DIAGNOSIS — F32A Depression, unspecified: Secondary | ICD-10-CM

## 2019-03-26 DIAGNOSIS — E7800 Pure hypercholesterolemia, unspecified: Secondary | ICD-10-CM

## 2019-03-26 DIAGNOSIS — E282 Polycystic ovarian syndrome: Secondary | ICD-10-CM

## 2019-03-26 DIAGNOSIS — E042 Nontoxic multinodular goiter: Secondary | ICD-10-CM

## 2019-03-26 DIAGNOSIS — K219 Gastro-esophageal reflux disease without esophagitis: Secondary | ICD-10-CM | POA: Diagnosis not present

## 2019-03-26 DIAGNOSIS — F419 Anxiety disorder, unspecified: Secondary | ICD-10-CM | POA: Diagnosis not present

## 2019-03-26 DIAGNOSIS — E119 Type 2 diabetes mellitus without complications: Secondary | ICD-10-CM

## 2019-03-26 NOTE — Patient Instructions (Signed)
Endoscopic Sleeve Gastroplasty  Endoscopic sleeve gastroplasty is a weight loss (bariatric) procedure. It is done by inserting a long, flexible tube (endoscope) through the mouth and into the stomach. Through the endoscope, the surgeon applies surgical staples or stitches (sutures) to make the stomach smaller. This procedure does not require any incisions. You may need this procedure if you are very overweight and have not been able to lose enough weight with diet and exercise. Your health care provider may recommend this procedure if other types of bariatric surgery are not safe for you. You are likely to lose weight after this surgery because your stomach will be smaller. You will need to work closely with your health care providers to maintain a healthy weight. Tell a health care provider about:  Any allergies you have.  All medicines you are taking, including vitamins, herbs, eye drops, creams, and over-the-counter medicines.  Any problems you or family members have had with anesthetic medicines.  Any blood disorders you have.  Any surgeries you have had.  Any medical conditions you have.  Whether you are pregnant or may be pregnant.  Any family history of stomach cancer.  Any stomach problems you have.  Any mental health problems you have. What are the risks? Generally, this is a safe procedure. However, problems may occur, including:  Stomach pain and nausea.  Infection.  Bleeding.  Allergic reactions to medicines or dyes.  Damage to the stomach or the part of the body that moves food from the mouth to the stomach (esophagus), such as a hole (perforation).  Stomach juices leaking outside of the stomach.  A blood clot that travels to a lung (pulmonary embolism). What happens before the procedure? Medicines  Ask your health care provider about: ? Changing or stopping your regular medicines. This is especially important if you are taking diabetes medicines or blood  thinners. ? Taking medicines such as aspirin and ibuprofen. These medicines can thin your blood. Do not take these medicines unless your health care provider tells you to take them. ? Taking over-the-counter medicines, vitamins, herbs, and supplements.  You may be prescribed antibiotic medicine. If so, take the medicine as told by your health care provider. Staying hydrated Follow instructions from your health care provider about hydration, which may include:  Up to 2 hours before the procedure - you may continue to drink clear liquids, such as water, clear fruit juice, black coffee, and plain tea. Eating and drinking restrictions Follow instructions from your health care provider about eating and drinking, which may include:  8 hours before the procedure - stop eating heavy meals or foods such as meat, fried foods, or fatty foods.  6 hours before the procedure - stop eating light meals or foods, such as toast or cereal.  6 hours before the procedure - stop drinking milk or drinks that contain milk.  2 hours before the procedure - stop drinking clear liquids. General instructions  You may need to meet with a diet and nutrition specialist (dietitian) and a mental health professional to make sure you can commit to maintaining a healthy diet and a healthy weight for years after this procedure.  Plan to have someone take you home from the hospital or clinic.  Plan to have a responsible adult care for you for at least 24 hours after you leave the hospital or clinic. This is important. What happens during the procedure?  To lower your risk of infection, your health care team will wash or sanitize their  hands.  An IV will be inserted into one of your veins.  You will be given one or more of the following: ? A medicine to make you fall asleep (general anesthetic). ? Antibiotics. ? Anti-nausea medicine.  You will be positioned so that you are lying on your left side.  An endoscope will  be passed through your mouth, down your esophagus, and into your stomach.  An air-like gas will be sent through the endoscope to expand (inflate) your stomach. This will give your surgeon a clearer view inside of your stomach and will make more room to do the procedure.  Your surgeon will use staples or sutures to make your stomach narrower and shorter. These will be applied using a tool at the end of the endoscope.  Your stomach will be washed out with an antibiotic solution.  The endoscope will be removed. The procedure may vary among health care providers and hospitals. What happens after the procedure?  Your blood pressure, heart rate, breathing rate, and blood oxygen level will be monitored until the medicines you were given have worn off.  You may continue to receive fluids and medicines through an IV. When you are able to drink clear fluids, your IV will be removed.  Do not drive for 24 hours if you were given a sedative during your procedure. Summary  Endoscopic sleeve gastroplasty is a procedure to help you lose weight (bariatric procedure).  This procedure is done through a long, flexible tube (endoscope). No incisions are necessary.  During this procedure, the surgeon will use staples or stitches (sutures) to make your stomach shorter and narrower.  It will be important to work with your health care providers for years after this procedure to maintain a healthy weight through diet and exercise. This information is not intended to replace advice given to you by your health care provider. Make sure you discuss any questions you have with your health care provider. Document Released: 03/29/2017 Document Revised: 12/26/2018 Document Reviewed: 03/29/2017 Elsevier Patient Education  2020 [employer].

## 2019-03-26 NOTE — Telephone Encounter (Signed)
Pt aware.

## 2019-03-26 NOTE — Telephone Encounter (Signed)
Paperwork faxed °

## 2019-03-26 NOTE — Progress Notes (Signed)
Subjective:    Patient ID: Debbie Walter, female    DOB: [birth year], 38 y.o.   MRN: [id number]  HPI  Pt presents to the clinic today for her annual exam. She is also due to follow up chronic conditions.  Anxiety and Depression: Chronic, but stable on Fluoxetine and Buspar. She no longer takes Xanax. She is not currently seeing a therapist or psychiatrist. She denies SI/HI.  HTN: Her BP today is 120/80. She is taking Valsartan, Spironolactone, Propanolol as prescribed. She takes a Potassium supplement as well. ECG from 11/2018 reviewed. She does follow with cardiology.   GERD: Triggered by tomato based and greasy foods. She denies breakthrough on Pantoprazole. There is no upper GI on file.   PCOS: She is taking Metformin BID as prescribed by endocrinology.  Multiple Thyroid Nodules: Last thyroid ultrasound from 09/2015 reviewed. She follows with endocrinology.  HLD: Her last LDL was 50, 09/2017. She denies myalgias on Simvastatin. She does not consume a low fat diet.  Flu: 06/2018 Tetanus: 02/2014 Pap Smear: 06/2015, Dr. Tressia Danas Mammogram: never Vision Screening: annually Dentist: bianually  Diet: She does eat meat. She consumes more fruits than veggies. She does eat some fried food. She drinks mostly water, some soda. Exercise: None  Review of Systems  Past Medical History:  Diagnosis Date  . Fibroids   . GERD (gastroesophageal reflux disease)    takes protonix  . History of kidney stones   . Hypertension   . MRSA infection    under arm  . Panic attacks   . PCOS (polycystic ovarian syndrome)    takes metformin for this    Current Outpatient Medications  Medication Sig Dispense Refill  . ALPRAZolam (XANAX) 0.25 MG tablet Take 1 tablet (0.25 mg total) by mouth daily as needed. 30 tablet 0  . amLODipine (NORVASC) 5 MG tablet Take by mouth.    Marland Kitchen aspirin EC 81 MG tablet Take 81 mg by mouth daily.    . busPIRone (BUSPAR) 5 MG tablet TAKE 1 TABLET BY MOUTH EVERY DAY 90  tablet 0  . cholecalciferol (VITAMIN D) 1000 UNITS tablet Take 1,000 Units by mouth daily.     . cyanocobalamin 500 MCG tablet Take 1 tablet (500 mcg total) by mouth daily.    Marland Kitchen FLUoxetine (PROZAC) 10 MG tablet TAKE 1 TABLET BY MOUTH EVERY DAY 90 tablet 0  . metFORMIN (GLUCOPHAGE) 500 MG tablet Take 1 tablet (500 mg total) by mouth 2 (two) times daily with a meal. 180 tablet 3  . metoCLOPramide (REGLAN) 10 MG tablet Take 1 tablet (10 mg total) by mouth every 8 (eight) hours as needed (headache or nausea). (Patient not taking: Reported on 11/27/2018) 30 tablet 0  . nitroGLYCERIN (NITROSTAT) 0.4 MG SL tablet DISSOLVE 1 TABLET UNDER THE TONUGE EVERY MINUTE AS NEEDED FOR CHEST PAIN  1  . Omega-3 Fatty Acids (FISH OIL) 1000 MG CAPS Take 1 capsule by mouth daily.    . pantoprazole (PROTONIX) 40 MG tablet TAKE 1 TABLET BY MOUTH EVERY DAY 90 tablet 0  . Prenat w/o A-FeCbGl-DSS-FA-DHA (CITRANATAL ASSURE) 35-1 & 300 MG tablet Take 1 tablet by mouth daily.  11  . Probiotic Product (PROBIOTIC DAILY PO) Take 1 capsule by mouth daily.    . propranolol ER (INDERAL LA) 160 MG SR capsule Take 1 capsule (160 mg total) by mouth daily. 90 capsule 2  . simvastatin (ZOCOR) 20 MG tablet Take 20 mg by mouth at bedtime.  5  . spironolactone (ALDACTONE)  50 MG tablet Take 1 tablet (50 mg total) by mouth daily. 30 tablet 6  . sulfamethoxazole-trimethoprim (BACTRIM DS,SEPTRA DS) 800-160 MG tablet Take 1 tablet by mouth 2 (two) times daily. (Patient not taking: Reported on 11/27/2018) 20 tablet 0  . valsartan (DIOVAN) 320 MG tablet TAKE 1 TABLET BY MOUTH EVERY DAY 90 tablet 1   No current facility-administered medications for this visit.     Allergies  Allergen Reactions  . Procardia [Nifedipine] Other (See Comments)    headache  . Wellbutrin [Bupropion] Other (See Comments)    "Makes my blood pressure go up - I don't ever want to take it again"  . Labetalol Other (See Comments)    "makes me feel like things are crawling  through my head"  . Hydromorphone Other (See Comments)    Makes pt feel out of this world. Patient states it needs to be pushed slow, but she can take it.  . Morphine And Related     Needs to be pushed slow or pt feels like she will code  . Sudafed [Pseudoephedrine Hcl]     Heart race / dries out mucus membranes  . Zithromax [Azithromycin] Other (See Comments)    Gi upset.  Diarrhea    Family History  Problem Relation Age of Onset  . Hyperlipidemia Mother   . Heart disease Mother   . Diabetes Mother   . Hypertension Mother   . Hyperlipidemia Father   . Hypertension Father   . Cancer Father        rectal  . Hypertension Sister   . Anesthesia problems Neg Hx   . Hypotension Neg Hx   . Malignant hyperthermia Neg Hx   . Pseudochol deficiency Neg Hx     Social History   Socioeconomic History  . Marital status: Married    Spouse name: Not on file  . Number of children: Not on file  . Years of education: Not on file  . Highest education level: Not on file  Occupational History  . Not on file  Social Needs  . Financial resource strain: Not on file  . Food insecurity    Worry: Not on file    Inability: Not on file  . Transportation needs    Medical: Not on file    Non-medical: Not on file  Tobacco Use  . Smoking status: Never Smoker  . Smokeless tobacco: Never Used  Substance and Sexual Activity  . Alcohol use: No    Alcohol/week: 0.0 standard drinks  . Drug use: No  . Sexual activity: Yes    Birth control/protection: None  Lifestyle  . Physical activity    Days per week: Not on file    Minutes per session: Not on file  . Stress: Not on file  Relationships  . Social Herbalist on phone: Not on file    Gets together: Not on file    Attends religious service: Not on file    Active member of club or organization: Not on file    Attends meetings of clubs or organizations: Not on file    Relationship status: Not on file  . Intimate partner violence     Fear of current or ex partner: Not on file    Emotionally abused: Not on file    Physically abused: Not on file    Forced sexual activity: Not on file  Other Topics Concern  . Not on file  Social History Narrative  . Not  on file     Constitutional: Pt reports weight gain. Denies fever, malaise, fatigue, headache.  HEENT: Denies eye pain, eye redness, ear pain, ringing in the ears, wax buildup, runny nose, nasal congestion, bloody nose, or sore throat. Respiratory: Denies difficulty breathing, shortness of breath, cough or sputum production.   Cardiovascular: Denies chest pain, chest tightness, palpitations or swelling in the hands or feet.  Gastrointestinal: Denies abdominal pain, bloating, constipation, diarrhea or blood in the stool.  GU: Denies urgency, frequency, pain with urination, burning sensation, blood in urine, odor or discharge. Musculoskeletal: Denies decrease in range of motion, difficulty with gait, muscle pain or joint pain and swelling.  Skin: Denies redness, rashes, lesions or ulcercations.  Neurological: Denies dizziness, difficulty with memory, difficulty with speech or problems with balance and coordination.  Psych: Pt has a history of anxiety. Denies depression, SI/HI.  No other specific complaints in a complete review of systems (except as listed in HPI above).     Objective:   Physical Exam   BP 120/80   Pulse 71   Temp 98.3 F (36.8 C)   Ht 5\' 1"  (1.549 m)   Wt 229 lb (103.9 kg)   LMP 12/25/2018   SpO2 97%   BMI 43.27 kg/m  Wt Readings from Last 3 Encounters:  03/26/19 229 lb (103.9 kg)  03/05/19 230 lb (104.3 kg)  11/27/18 230 lb 1.6 oz (104.4 kg)    General: Appears her stated age, obese, in NAD. Skin: Warm, dry and intact.  HEENT: Head: normal shape and size; Eyes: sclera white, no icterus, conjunctiva pink, PERRLA and EOMs intact; Ears: Tm's gray and intact, normal light reflex;  Neck:  Neck supple, trachea midline.  Cardiovascular: Normal  rate and rhythm. S1,S2 noted.  No murmur, rubs or gallops noted. No JVD or BLE edema.  Pulmonary/Chest: Normal effort and positive vesicular breath sounds. No respiratory distress. No wheezes, rales or ronchi noted.  Abdomen: Soft and nontender. Normal bowel sounds. No distention or masses noted. Liver, spleen and kidneys non palpable. Musculoskeletal: Strength 5/5 BUE/BLE. No difficulty with gait.  Neurological: Alert and oriented. Cranial nerves II-XII grossly intact. Coordination normal.  Psychiatric: Mood and affect normal. Behavior is normal. Judgment and thought content normal.    BMET    Component Value Date/Time   NA 141 10/30/2017 1605   K 3.6 10/30/2017 1605   CL 103 10/30/2017 1605   CO2 27 10/30/2017 1605   GLUCOSE 97 10/30/2017 1605   BUN 5 (L) 10/30/2017 1605   CREATININE 0.79 10/30/2017 1605   CREATININE 0.76 02/08/2017 1705   CALCIUM 9.4 10/30/2017 1605   GFRNONAA >60 10/30/2017 1605   GFRNONAA >89 02/08/2017 1705   GFRAA >60 10/30/2017 1605   GFRAA >89 02/08/2017 1705    Lipid Panel     Component Value Date/Time   CHOL 123 09/26/2017 1456   TRIG 170.0 (H) 09/26/2017 1456   HDL 38.50 (L) 09/26/2017 1456   CHOLHDL 3 09/26/2017 1456   VLDL 34.0 09/26/2017 1456   LDLCALC 50 09/26/2017 1456    CBC    Component Value Date/Time   WBC 10.5 10/30/2017 1605   RBC 5.01 10/30/2017 1605   HGB 13.3 10/30/2017 1605   HCT 40.9 10/30/2017 1605   PLT 376 10/30/2017 1605   MCV 81.6 10/30/2017 1605   MCH 26.5 10/30/2017 1605   MCHC 32.5 10/30/2017 1605   RDW 15.0 10/30/2017 1605   LYMPHSABS 1.8 01/12/2017 0907   MONOABS 0.4 01/12/2017 1610  EOSABS 0.1 01/12/2017 0907   BASOSABS 0.0 01/12/2017 0907    Hgb A1C Lab Results  Component Value Date   HGBA1C 6.0 02/08/2017           Assessment & Plan:   Preventative Health Maintenance:  Encouraged her to get a flu shot in the fall Tetanus UTD Pap smear UTD, will request copy of pap She prefers fer her  GYN to order her mammograms, as they will be done at the GYN office Encouraged her to consume a balanced diet and exercise regimen Advised her to see an eye doctor and dentist annually Will check CBC, CMET, TSH, Lipid, A1C and Vit D today  RTC in 6 months to follow up chronic conditions Webb Silversmith, NP

## 2019-03-26 NOTE — Telephone Encounter (Signed)
Done, placed in my outbox.

## 2019-03-27 LAB — COMPREHENSIVE METABOLIC PANEL
ALT: 28 U/L (ref 0–35)
AST: 18 U/L (ref 0–37)
Albumin: 4.5 g/dL (ref 3.5–5.2)
Alkaline Phosphatase: 71 U/L (ref 39–117)
BUN: 12 mg/dL (ref 6–23)
CO2: 30 mEq/L (ref 19–32)
Calcium: 9.9 mg/dL (ref 8.4–10.5)
Chloride: 103 mEq/L (ref 96–112)
Creatinine, Ser: 0.76 mg/dL (ref 0.40–1.20)
GFR: 102.05 mL/min (ref >60.00)
Glucose, Bld: 83 mg/dL (ref 70–99)
Potassium: 4.2 mEq/L (ref 3.5–5.1)
Sodium: 140 mEq/L (ref 135–145)
Total Bilirubin: 0.4 mg/dL (ref 0.2–1.2)
Total Protein: 6.9 g/dL (ref 6.0–8.3)

## 2019-03-27 LAB — HEMOGLOBIN A1C: Hgb A1c MFr Bld: 6.5 % (ref 4.6–6.5)

## 2019-03-27 LAB — LIPID PANEL
Cholesterol: 121 mg/dL (ref 0–200)
HDL: 37.3 mg/dL — ABNORMAL LOW (ref >39.00)
NonHDL: 83.75
Total CHOL/HDL Ratio: 3
Triglycerides: 202 mg/dL — ABNORMAL HIGH (ref 0.0–149.0)
VLDL: 40.4 mg/dL — ABNORMAL HIGH (ref 0.0–40.0)

## 2019-03-27 LAB — CBC
HCT: 40.9 % (ref 36.0–46.0)
Hemoglobin: 13.5 g/dL (ref 12.0–15.0)
MCHC: 33 g/dL (ref 30.0–36.0)
MCV: 82.3 fl (ref 78.0–100.0)
Platelets: 338 10*3/uL (ref 150.0–400.0)
RBC: 4.96 Mil/uL (ref 3.87–5.11)
RDW: 15.1 % (ref 11.5–15.5)
WBC: 9.2 10*3/uL (ref 4.0–10.5)

## 2019-03-27 LAB — LDL CHOLESTEROL, DIRECT: Direct LDL: 74 mg/dL

## 2019-03-27 LAB — TSH: TSH: 2.17 u[IU]/mL (ref 0.35–4.50)

## 2019-03-27 LAB — VITAMIN D 25 HYDROXY (VIT D DEFICIENCY, FRACTURES): VITD: 28.46 ng/mL — ABNORMAL LOW (ref 30.00–100.00)

## 2019-03-28 ENCOUNTER — Encounter: Payer: Self-pay | Admitting: Internal Medicine

## 2019-03-28 NOTE — Telephone Encounter (Signed)
Copy for pt °Copy for scan °

## 2019-03-28 NOTE — Assessment & Plan Note (Signed)
Discussed how avoiding foods and weight loss could help reduce reflux symptoms Continue Pantoprazole now CBC and C met today

## 2019-03-28 NOTE — Assessment & Plan Note (Signed)
C met and lipid profile today Encouraged her to consume a low-fat diet Continue Simvastatin for now

## 2019-03-28 NOTE — Assessment & Plan Note (Signed)
Continue Metformin as prescribed A1c today

## 2019-03-28 NOTE — Assessment & Plan Note (Signed)
Support offered today Continue Fluoxetine and BuSpar We will monitor

## 2019-03-28 NOTE — Assessment & Plan Note (Signed)
Will obtain TSH She will continue to follow with endocrinology

## 2019-03-28 NOTE — Assessment & Plan Note (Signed)
Controlled on Valsartan, Spironolactone and Propanolol C met today Reinforced DASH diet and exercise for weight loss We will monitor

## 2019-06-04 ENCOUNTER — Telehealth: Payer: Federal, State, Local not specified - PPO | Admitting: Cardiology

## 2019-06-04 ENCOUNTER — Other Ambulatory Visit: Payer: Self-pay | Admitting: Internal Medicine

## 2019-06-07 DIAGNOSIS — R6889 Other general symptoms and signs: Secondary | ICD-10-CM | POA: Diagnosis not present

## 2019-06-07 DIAGNOSIS — J0100 Acute maxillary sinusitis, unspecified: Secondary | ICD-10-CM | POA: Diagnosis not present

## 2019-06-09 ENCOUNTER — Telehealth: Payer: Self-pay

## 2019-06-09 NOTE — Telephone Encounter (Signed)
Patient was told when she called team health that despite her symptoms it would be "hours" before a nurse could call her back.  She was not happy about this and her husband took her to urgent care.    Her flu test was negative and she was tested for COVID but those results are still pending.  She is doing better but still having some flu-like symptoms.  She is under quarantine and is waiting for her test results to come back.  Patient will call and let us know if it comes back positive and update [place] on how she is doing.

## 2019-06-09 NOTE — Telephone Encounter (Signed)
[facility] Night - Client TELEPHONE ADVICE RECORD [facility] Patient Name: Debbie Walter Gender: Female DOB: [birth year] Age: 38 Y 25 D Return Phone Number: [phone] (Secondary), [phone] (Alternate) Address: [place] [address] Client [facility] Night - Client Client Site [facility] Webb Silversmith - NP Contact Type Call Who Is Calling Patient / Member / Family / Caregiver Call Type Triage / Clinical Relationship To Patient Self Return Phone Number [phone] (Alternate) Chief Complaint Flu Symptom Reason for Call Request to Speak to a Physician Initial Comment Courtesy Call - no answer - no Caller reports she has the flu and would like to speak to the Dr on call. Caller reports body aches, weakness, fever, chills, difficulty walking. Translation No Nurse Assessment Guidelines Guideline Title Affirmed Question Affirmed Notes Nurse Date/Time (Eastern Time) Disp. Time Eilene Ghazi Time) Disposition Final User 06/07/2019 5:13:05 PM Send To Nurse Ria Comment, RN, April 06/07/2019 5:16:54 PM Attempt made - no message left Bonnita Nasuti 06/07/2019 5:18:46 PM Send To Clinical Follow Up Allie Bossier 06/07/2019 5:19:45 PM Send To Nurse Ria Comment, RN, April 06/07/2019 5:29:04 PM Attempt made - message left Oren Bracket 06/07/2019 6:04:21 PM FINAL ATTEMPT MADE - message left Yes Hardin Negus, RN, [employer]

## 2019-06-10 ENCOUNTER — Other Ambulatory Visit: Payer: Self-pay

## 2019-06-10 ENCOUNTER — Encounter (HOSPITAL_COMMUNITY): Payer: Self-pay

## 2019-06-10 ENCOUNTER — Inpatient Hospital Stay (HOSPITAL_COMMUNITY)
Admission: EM | Admit: 2019-06-10 | Discharge: 2019-06-15 | DRG: 177 | Disposition: A | Discharge status: Home / Self Care | Payer: Federal, State, Local not specified - PPO | Attending: Internal Medicine | Admitting: Internal Medicine

## 2019-06-10 ENCOUNTER — Telehealth: Payer: Self-pay | Admitting: Internal Medicine

## 2019-06-10 ENCOUNTER — Emergency Department (HOSPITAL_COMMUNITY): Payer: Federal, State, Local not specified - PPO

## 2019-06-10 DIAGNOSIS — Z79899 Other long term (current) drug therapy: Secondary | ICD-10-CM

## 2019-06-10 DIAGNOSIS — Z6841 Body Mass Index (BMI) 40.0 and over, adult: Secondary | ICD-10-CM

## 2019-06-10 DIAGNOSIS — D259 Leiomyoma of uterus, unspecified: Secondary | ICD-10-CM | POA: Diagnosis present

## 2019-06-10 DIAGNOSIS — J1289 Other viral pneumonia: Secondary | ICD-10-CM | POA: Diagnosis present

## 2019-06-10 DIAGNOSIS — K219 Gastro-esophageal reflux disease without esophagitis: Secondary | ICD-10-CM | POA: Diagnosis present

## 2019-06-10 DIAGNOSIS — E119 Type 2 diabetes mellitus without complications: Secondary | ICD-10-CM | POA: Diagnosis present

## 2019-06-10 DIAGNOSIS — F329 Major depressive disorder, single episode, unspecified: Secondary | ICD-10-CM | POA: Diagnosis not present

## 2019-06-10 DIAGNOSIS — U071 COVID-19: Principal | ICD-10-CM | POA: Diagnosis present

## 2019-06-10 DIAGNOSIS — I10 Essential (primary) hypertension: Secondary | ICD-10-CM | POA: Diagnosis present

## 2019-06-10 DIAGNOSIS — Z7984 Long term (current) use of oral hypoglycemic drugs: Secondary | ICD-10-CM

## 2019-06-10 DIAGNOSIS — F419 Anxiety disorder, unspecified: Secondary | ICD-10-CM | POA: Diagnosis present

## 2019-06-10 DIAGNOSIS — R0602 Shortness of breath: Secondary | ICD-10-CM | POA: Diagnosis not present

## 2019-06-10 DIAGNOSIS — Z87442 Personal history of urinary calculi: Secondary | ICD-10-CM | POA: Diagnosis not present

## 2019-06-10 DIAGNOSIS — F410 Panic disorder [episodic paroxysmal anxiety] without agoraphobia: Secondary | ICD-10-CM | POA: Diagnosis not present

## 2019-06-10 DIAGNOSIS — Q613 Polycystic kidney, unspecified: Secondary | ICD-10-CM | POA: Diagnosis not present

## 2019-06-10 DIAGNOSIS — J1282 Pneumonia due to coronavirus disease 2019: Secondary | ICD-10-CM

## 2019-06-10 DIAGNOSIS — R1310 Dysphagia, unspecified: Secondary | ICD-10-CM | POA: Diagnosis present

## 2019-06-10 DIAGNOSIS — Z8349 Family history of other endocrine, nutritional and metabolic diseases: Secondary | ICD-10-CM

## 2019-06-10 DIAGNOSIS — Z8249 Family history of ischemic heart disease and other diseases of the circulatory system: Secondary | ICD-10-CM | POA: Diagnosis not present

## 2019-06-10 DIAGNOSIS — E282 Polycystic ovarian syndrome: Secondary | ICD-10-CM | POA: Diagnosis present

## 2019-06-10 DIAGNOSIS — Z833 Family history of diabetes mellitus: Secondary | ICD-10-CM

## 2019-06-10 DIAGNOSIS — R509 Fever, unspecified: Secondary | ICD-10-CM | POA: Diagnosis not present

## 2019-06-10 DIAGNOSIS — F32A Depression, unspecified: Secondary | ICD-10-CM | POA: Diagnosis present

## 2019-06-10 DIAGNOSIS — J9601 Acute respiratory failure with hypoxia: Secondary | ICD-10-CM | POA: Diagnosis present

## 2019-06-10 DIAGNOSIS — R0902 Hypoxemia: Secondary | ICD-10-CM | POA: Diagnosis not present

## 2019-06-10 DIAGNOSIS — Z209 Contact with and (suspected) exposure to unspecified communicable disease: Secondary | ICD-10-CM | POA: Diagnosis not present

## 2019-06-10 DIAGNOSIS — Z7982 Long term (current) use of aspirin: Secondary | ICD-10-CM

## 2019-06-10 LAB — CBG MONITORING, ED: Glucose-Capillary: 118 mg/dL — ABNORMAL HIGH (ref 70–99)

## 2019-06-10 LAB — GLUCOSE, CAPILLARY
Glucose-Capillary: 122 mg/dL — ABNORMAL HIGH (ref 70–99)
Glucose-Capillary: 157 mg/dL — ABNORMAL HIGH (ref 70–99)

## 2019-06-10 LAB — CBC WITH DIFFERENTIAL/PLATELET
Abs Immature Granulocytes: 0.07 10*3/uL (ref 0.00–0.07)
Basophils Absolute: 0 10*3/uL (ref 0.0–0.1)
Basophils Relative: 1 %
Eosinophils Absolute: 0 10*3/uL (ref 0.0–0.5)
Eosinophils Relative: 0 %
HCT: 39.9 % (ref 36.0–46.0)
Hemoglobin: 13.3 g/dL (ref 12.0–15.0)
Immature Granulocytes: 2 %
Lymphocytes Relative: 21 %
Lymphs Abs: 0.9 10*3/uL (ref 0.7–4.0)
MCH: 27.3 pg (ref 26.0–34.0)
MCHC: 33.3 g/dL (ref 30.0–36.0)
MCV: 81.9 fL (ref 80.0–100.0)
Monocytes Absolute: 0.3 10*3/uL (ref 0.1–1.0)
Monocytes Relative: 7 %
Neutro Abs: 3.1 10*3/uL (ref 1.7–7.7)
Neutrophils Relative %: 69 %
Platelets: 237 10*3/uL (ref 150–400)
RBC: 4.87 MIL/uL (ref 3.87–5.11)
RDW: 13.9 % (ref 11.5–15.5)
WBC: 4.4 10*3/uL (ref 4.0–10.5)
nRBC: 0 % (ref 0.0–0.2)

## 2019-06-10 LAB — COMPREHENSIVE METABOLIC PANEL
ALT: 24 U/L (ref 0–44)
AST: 28 U/L (ref 15–41)
Albumin: 3.6 g/dL (ref 3.5–5.0)
Alkaline Phosphatase: 49 U/L (ref 38–126)
Anion gap: 13 (ref 5–15)
BUN: 5 mg/dL — ABNORMAL LOW (ref 6–20)
CO2: 21 mmol/L — ABNORMAL LOW (ref 22–32)
Calcium: 8.8 mg/dL — ABNORMAL LOW (ref 8.9–10.3)
Chloride: 103 mmol/L (ref 98–111)
Creatinine, Ser: 0.76 mg/dL (ref 0.44–1.00)
GFR calc Af Amer: >60 mL/min (ref >60)
GFR calc non Af Amer: >60 mL/min (ref >60)
Glucose, Bld: 127 mg/dL — ABNORMAL HIGH (ref 70–99)
Potassium: 3.9 mmol/L (ref 3.5–5.1)
Sodium: 137 mmol/L (ref 135–145)
Total Bilirubin: 0.3 mg/dL (ref 0.3–1.2)
Total Protein: 6.9 g/dL (ref 6.5–8.1)

## 2019-06-10 LAB — URINALYSIS, ROUTINE W REFLEX MICROSCOPIC
Bacteria, UA: NONE SEEN
Bilirubin Urine: NEGATIVE
Glucose, UA: NEGATIVE mg/dL
Ketones, ur: NEGATIVE mg/dL
Leukocytes,Ua: NEGATIVE
Nitrite: NEGATIVE
Protein, ur: 100 mg/dL — AB
Specific Gravity, Urine: 1.006 (ref 1.005–1.030)
pH: 7 (ref 5.0–8.0)

## 2019-06-10 LAB — C-REACTIVE PROTEIN: CRP: 6.5 mg/dL — ABNORMAL HIGH (ref <1.0)

## 2019-06-10 LAB — HIV ANTIBODY (ROUTINE TESTING W REFLEX): HIV Screen 4th Generation wRfx: NONREACTIVE

## 2019-06-10 LAB — FIBRINOGEN: Fibrinogen: 586 mg/dL — ABNORMAL HIGH (ref 210–475)

## 2019-06-10 LAB — LACTATE DEHYDROGENASE: LDH: 341 U/L — ABNORMAL HIGH (ref 98–192)

## 2019-06-10 LAB — MRSA PCR SCREENING: MRSA by PCR: NEGATIVE

## 2019-06-10 LAB — D-DIMER, QUANTITATIVE: D-Dimer, Quant: 0.51 ug/mL-FEU — ABNORMAL HIGH (ref 0.00–0.50)

## 2019-06-10 LAB — LIPASE, BLOOD: Lipase: 27 U/L (ref 11–51)

## 2019-06-10 LAB — FERRITIN: Ferritin: 144 ng/mL (ref 11–307)

## 2019-06-10 LAB — SARS CORONAVIRUS 2 BY RT PCR (HOSPITAL ORDER, PERFORMED IN ~~LOC~~ HOSPITAL LAB): SARS Coronavirus 2: POSITIVE — AB

## 2019-06-10 LAB — TROPONIN I (HIGH SENSITIVITY): Troponin I (High Sensitivity): 6 ng/L (ref <18)

## 2019-06-10 MED ORDER — INSULIN ASPART 100 UNIT/ML ~~LOC~~ SOLN: 0.0000 [IU] | Freq: Every day | SUBCUTANEOUS | Status: DC

## 2019-06-10 MED ORDER — ZINC SULFATE 220 (50 ZN) MG PO CAPS
220.0000 mg | ORAL_CAPSULE | Freq: Every day | ORAL | Status: DC
  Administered 2019-06-10 – 2019-06-15 (×6): 220 mg via ORAL
  Filled 2019-06-10 (×6): qty 1

## 2019-06-10 MED ORDER — SIMVASTATIN 20 MG PO TABS
20.0000 mg | ORAL_TABLET | Freq: Every evening | ORAL | Status: DC
  Administered 2019-06-10 – 2019-06-14 (×5): 20 mg via ORAL
  Filled 2019-06-10 (×5): qty 1

## 2019-06-10 MED ORDER — INSULIN ASPART 100 UNIT/ML ~~LOC~~ SOLN: 0.0000 [IU] | Freq: Three times a day (TID) | SUBCUTANEOUS | Status: DC

## 2019-06-10 MED ORDER — ENOXAPARIN SODIUM 60 MG/0.6ML ~~LOC~~ SOLN
50.0000 mg | SUBCUTANEOUS | Status: DC
  Administered 2019-06-11 – 2019-06-15 (×5): 50 mg via SUBCUTANEOUS
  Filled 2019-06-10 (×5): qty 0.6

## 2019-06-10 MED ORDER — VITAMIN D 25 MCG (1000 UNIT) PO TABS
1000.0000 [IU] | ORAL_TABLET | Freq: Every day | ORAL | Status: DC
  Administered 2019-06-10 – 2019-06-15 (×6): 1000 [IU] via ORAL
  Filled 2019-06-10 (×6): qty 1

## 2019-06-10 MED ORDER — ACETAMINOPHEN 500 MG PO TABS
1000.0000 mg | ORAL_TABLET | Freq: Once | ORAL | Status: AC
  Administered 2019-06-10: 1000 mg via ORAL
  Filled 2019-06-10: qty 2

## 2019-06-10 MED ORDER — SODIUM CHLORIDE 0.9 % IV SOLN
200.0000 mg | Freq: Once | INTRAVENOUS | Status: AC
  Administered 2019-06-10: 200 mg via INTRAVENOUS
  Filled 2019-06-10: qty 40

## 2019-06-10 MED ORDER — PROPRANOLOL HCL ER 160 MG PO CP24
160.0000 mg | ORAL_CAPSULE | Freq: Every day | ORAL | Status: DC
  Administered 2019-06-10 – 2019-06-15 (×6): 160 mg via ORAL
  Filled 2019-06-10 (×7): qty 1

## 2019-06-10 MED ORDER — FLUOXETINE HCL 20 MG PO TABS
10.0000 mg | ORAL_TABLET | Freq: Every day | ORAL | Status: DC
  Filled 2019-06-10 (×2): qty 1

## 2019-06-10 MED ORDER — ONDANSETRON HCL 4 MG/2ML IJ SOLN
4.0000 mg | Freq: Once | INTRAMUSCULAR | Status: AC
  Administered 2019-06-10: 4 mg via INTRAVENOUS
  Filled 2019-06-10: qty 2

## 2019-06-10 MED ORDER — PANTOPRAZOLE SODIUM 40 MG PO TBEC
40.0000 mg | DELAYED_RELEASE_TABLET | Freq: Every day | ORAL | Status: DC
  Administered 2019-06-10 – 2019-06-15 (×6): 40 mg via ORAL
  Filled 2019-06-10 (×6): qty 1

## 2019-06-10 MED ORDER — ASPIRIN EC 81 MG PO TBEC
81.0000 mg | DELAYED_RELEASE_TABLET | Freq: Every day | ORAL | Status: DC
  Administered 2019-06-10 – 2019-06-15 (×6): 81 mg via ORAL
  Filled 2019-06-10 (×6): qty 1

## 2019-06-10 MED ORDER — VITAMIN B-12 1000 MCG PO TABS
500.0000 ug | ORAL_TABLET | Freq: Every day | ORAL | Status: DC
  Administered 2019-06-10 – 2019-06-15 (×6): 500 ug via ORAL
  Filled 2019-06-10 (×6): qty 1

## 2019-06-10 MED ORDER — SODIUM CHLORIDE 0.9 % IV SOLN
100.0000 mg | INTRAVENOUS | Status: AC
  Administered 2019-06-11 – 2019-06-14 (×4): 100 mg via INTRAVENOUS
  Filled 2019-06-10 (×4): qty 20

## 2019-06-10 MED ORDER — BUSPIRONE HCL 10 MG PO TABS
5.0000 mg | ORAL_TABLET | Freq: Every day | ORAL | Status: DC
  Administered 2019-06-10 – 2019-06-15 (×6): 5 mg via ORAL
  Filled 2019-06-10 (×7): qty 0.5

## 2019-06-10 MED ORDER — VITAMIN C 500 MG PO TABS
500.0000 mg | ORAL_TABLET | Freq: Every day | ORAL | Status: DC
  Administered 2019-06-10 – 2019-06-15 (×6): 500 mg via ORAL
  Filled 2019-06-10 (×6): qty 1

## 2019-06-10 MED ORDER — DEXAMETHASONE SODIUM PHOSPHATE 10 MG/ML IJ SOLN
6.0000 mg | INTRAMUSCULAR | Status: DC
  Administered 2019-06-10 – 2019-06-15 (×6): 6 mg via INTRAVENOUS
  Filled 2019-06-10 (×6): qty 1

## 2019-06-10 MED ORDER — ENOXAPARIN SODIUM 40 MG/0.4ML ~~LOC~~ SOLN
40.0000 mg | SUBCUTANEOUS | Status: DC
  Administered 2019-06-10: 40 mg via SUBCUTANEOUS
  Filled 2019-06-10 (×2): qty 0.4

## 2019-06-10 MED ORDER — ACETAMINOPHEN 325 MG PO TABS
650.0000 mg | ORAL_TABLET | Freq: Four times a day (QID) | ORAL | Status: DC | PRN
  Administered 2019-06-11: 650 mg via ORAL
  Filled 2019-06-10: qty 2

## 2019-06-10 NOTE — Telephone Encounter (Signed)
Pt called back fmla should start 06/09/2019 end 06/30/2019

## 2019-06-10 NOTE — ED Notes (Signed)
Ordered breakfast--Debbie Walter 

## 2019-06-10 NOTE — ED Provider Notes (Signed)
[facility] EMERGENCY DEPARTMENT Provider Note   CSN: [id number] Arrival date & time: 06/10/19  0219     History   Chief Complaint Chief Complaint  Patient presents with  . Shortness of Breath    HPI Debbie Walter is a 38 y.o. female with a history of prediabetes, HTN, GERD, kidney stones, hyperparathyroidism, and HLD who presents to the emergency department by EMS with a chief complaint of shortness of breath.  The patient reports that she developed fever and chills on 9/16.  She developed shortness of breath and chest tightness on 9/18 and was tested for COVID-19 at urgent care the following day.  Results are pending.  Shortness of breath, cough, and chest tightness have been worsening since onset.  She reports that earlier today that she was getting winded walking from room to room.  She reports associated loss of sense of taste and smell, generalized weakness, and decreased appetite.  She reports that she has stopped taking her metformin over the last few days because she has barely eaten.  She has been forcing herself to drink fluids.  No change in urine output.  She reports one episode of nonbloody, nonbilious vomiting yesterday and a second episode today.  She has been taking Tylenol, last dose at 1730.  She is unsure how much she took.  She is a never smoker.  No history of underlying pulmonary medical conditions.      The history is provided by the patient. No language interpreter was used.    Past Medical History:  Diagnosis Date  . Fibroids   . GERD (gastroesophageal reflux disease)    takes protonix  . History of kidney stones   . Hypertension   . MRSA infection    under arm  . Panic attacks   . PCOS (polycystic ovarian syndrome)    takes metformin for this    Patient Active Problem List   Diagnosis Date Noted  . Pneumonia due to COVID-19 virus 06/10/2019  . Acute respiratory failure with hypoxia ([place]) 06/10/2019  . HLD (hyperlipidemia)  09/26/2017  . Multiple thyroid nodules 01/17/2016  . Hyperparathyroidism ([place]) 04/28/2015  . HTN (hypertension) 02/10/2015  . PCOS (polycystic ovarian syndrome) 02/10/2015  . Anxiety and depression 02/10/2015  . GERD (gastroesophageal reflux disease) 02/10/2015  . Elevated urine levels of catecholamines 01/14/2015    Past Surgical History:  Procedure Laterality Date  . CESAREAN SECTION    . CESAREAN SECTION WITH BILATERAL TUBAL LIGATION Bilateral 05/15/2014   Procedure: CESAREAN SECTION WITH BILATERAL TUBAL LIGATION;  Surgeon: Cyril Mourning, MD;  Location: [facility];  Service: Obstetrics;  Laterality: Bilateral;  repeat  edc 06/10/14  . CHOLECYSTECTOMY N/A 10/31/2017   Procedure: LAPAROSCOPIC CHOLECYSTECTOMY;  Surgeon: Coralie Keens, MD;  Location: [facility];  Service: General;  Laterality: N/A;  . HYSTEROSCOPY    . PARATHYROIDECTOMY     REMOVED PART OF THYROID   . PARATHYROIDECTOMY       OB History    Gravida  6   Para  2   Term  1   Preterm  1   AB  4   Living  2     SAB  3   TAB      Ectopic  1   Multiple      Live Births  2            Home Medications    Prior to Admission medications   Medication Sig Start Date End Date  Taking? Authorizing Provider  amoxicillin-clavulanate (AUGMENTIN) 875-125 MG tablet Take 1 tablet by mouth 2 (two) times daily. 06/07/19  Yes [provider]  aspirin EC 81 MG tablet Take 81 mg by mouth daily.   Yes [provider]  busPIRone (BUSPAR) 5 MG tablet TAKE 1 TABLET BY MOUTH EVERY DAY Patient taking differently: Take 5 mg by mouth daily.  03/25/19  Yes [name], NP  cholecalciferol (VITAMIN D) 1000 UNITS tablet Take 1,000 Units by mouth daily.    Yes [provider]  cyanocobalamin 500 MCG tablet Take 1 tablet (500 mcg total) by mouth daily. 03/23/16  Yes Baity, Coralie Keens, NP  FLUoxetine (PROZAC) 10 MG tablet TAKE 1 TABLET BY MOUTH EVERY DAY Patient taking differently: Take 10 mg by mouth daily.   03/23/19  Yes [name], NP  metFORMIN (GLUCOPHAGE) 500 MG tablet Take 1 tablet (500 mg total) by mouth 2 (two) times daily with a meal. 01/14/15  Yes Philemon Kingdom, MD  nitroGLYCERIN (NITROSTAT) 0.4 MG SL tablet Place 0.4 mg under the tongue every 5 (five) minutes as needed for chest pain.  02/29/16  Yes [provider]  Omega-3 Fatty Acids (FISH OIL) 1000 MG CAPS Take 1 capsule by mouth daily.   Yes [provider]  pantoprazole (PROTONIX) 40 MG tablet TAKE 1 TABLET BY MOUTH EVERY DAY Patient taking differently: Take 40 mg by mouth daily.  06/05/19  Yes Baity, Coralie Keens, NP  propranolol ER (INDERAL LA) 160 MG SR capsule Take 1 capsule (160 mg total) by mouth daily. 12/02/18  Yes Adrian Prows, MD  simvastatin (ZOCOR) 40 MG tablet Take 20 mg by mouth every evening. 04/17/19  Yes [provider]  spironolactone (ALDACTONE) 50 MG tablet Take 1 tablet (50 mg total) by mouth daily. 03/06/19  Yes Miquel Dunn, NP  valsartan (DIOVAN) 320 MG tablet TAKE 1 TABLET BY MOUTH EVERY DAY Patient taking differently: Take 320 mg by mouth daily.  03/17/19  Yes Miquel Dunn, NP    Family History Family History  Problem Relation Age of Onset  . Hyperlipidemia Mother   . Heart disease Mother   . Diabetes Mother   . Hypertension Mother   . Hyperlipidemia Father   . Hypertension Father   . Cancer Father        rectal  . Hypertension Sister   . Anesthesia problems Neg Hx   . Hypotension Neg Hx   . Malignant hyperthermia Neg Hx   . Pseudochol deficiency Neg Hx     Social History Social History   Tobacco Use  . Smoking status: Never Smoker  . Smokeless tobacco: Never Used  Substance Use Topics  . Alcohol use: No    Alcohol/week: 0.0 standard drinks  . Drug use: No     Allergies   Procardia [nifedipine], Wellbutrin [bupropion], Labetalol, Hydromorphone, Morphine and related, Sudafed [pseudoephedrine hcl], and Zithromax [azithromycin]   Review of  Systems Review of Systems  Constitutional: Positive for chills and fever. Negative for activity change.  HENT: Negative for congestion and sore throat.   Eyes: Negative for visual disturbance.  Respiratory: Positive for cough and shortness of breath. Negative for wheezing.   Cardiovascular: Negative for chest pain, palpitations and leg swelling.  Gastrointestinal: Positive for nausea and vomiting. Negative for abdominal pain and constipation.  Genitourinary: Negative for dysuria.  Musculoskeletal: Negative for back pain.  Skin: Negative for rash.  Allergic/Immunologic: Negative for immunocompromised state.  Neurological: Positive for weakness (generalized). Negative  for dizziness, seizures, syncope, numbness and headaches.  Psychiatric/Behavioral: Negative for confusion.   Physical Exam Updated Vital Signs BP (!) 162/87   Pulse 86   Temp 99.8 F (37.7 C) (Oral)   Resp (!) 28   LMP 06/08/2019   SpO2 (!) 87%   Physical Exam Vitals signs and nursing note reviewed.  Constitutional:      General: She is not in acute distress. HENT:     Head: Normocephalic.  Eyes:     Conjunctiva/sclera: Conjunctivae normal.  Neck:     Musculoskeletal: Neck supple.  Cardiovascular:     Rate and Rhythm: Normal rate and regular rhythm.     Pulses: Normal pulses.     Heart sounds: Normal heart sounds. No murmur. No friction rub. No gallop.   Pulmonary:     Effort: Pulmonary effort is normal. Tachypnea present. No accessory muscle usage or respiratory distress.     Breath sounds: No stridor.     Comments: She is tachypneic with decreased air movement throughout.  Speaks in complete, fluent sentences.  Crackles in the bilateral bases.  No retractions or respiratory distress.  Nasal cannula is in place. Chest:     Chest wall: No mass or tenderness.  Abdominal:     General: There is no distension.     Palpations: Abdomen is soft.  Musculoskeletal:     Right lower leg: No edema.     Left lower  leg: No edema.  Skin:    General: Skin is warm.     Capillary Refill: Capillary refill takes less than 2 seconds.     Findings: No rash.  Neurological:     Mental Status: She is alert.  Psychiatric:        Behavior: Behavior normal.      ED Treatments / Results  Labs (all labs ordered are listed, but only abnormal results are displayed) Labs Reviewed  SARS CORONAVIRUS 2 (HOSPITAL ORDER, [place] [facility]) - Abnormal; Notable for the following components:      Result Value   SARS Coronavirus 2 POSITIVE (*)    All other components within normal limits  COMPREHENSIVE METABOLIC PANEL - Abnormal; Notable for the following components:   CO2 21 (*)    Glucose, Bld 127 (*)    BUN 5 (*)    Calcium 8.8 (*)    All other components within normal limits  URINALYSIS, ROUTINE W REFLEX MICROSCOPIC - Abnormal; Notable for the following components:   Hgb urine dipstick MODERATE (*)    Protein, ur 100 (*)    All other components within normal limits  CBG MONITORING, ED - Abnormal; Notable for the following components:   Glucose-Capillary 118 (*)    All other components within normal limits  CBC WITH DIFFERENTIAL/PLATELET  LIPASE, BLOOD  HIV ANTIBODY (ROUTINE TESTING W REFLEX)  C-REACTIVE PROTEIN  D-DIMER, QUANTITATIVE (NOT AT [facility])  LACTATE DEHYDROGENASE  FERRITIN  FIBRINOGEN  TROPONIN I (HIGH SENSITIVITY)    EKG EKG Interpretation  Date/Time:  Tuesday June 10 2019 02:30:33 EDT Ventricular Rate:  88 PR Interval:    QRS Duration: 88 QT Interval:  357 QTC Calculation: 432 R Axis:   42 Text Interpretation:  Sinus rhythm Probable left atrial enlargement Borderline repolarization abnormality No significant change since last tracing Confirmed by Pryor Curia [phone]) on 06/10/2019 2:40:01 AM   Radiology Dg Chest Portable 1 View  Result Date: 06/10/2019 CLINICAL DATA:  Fever and shortness of breath. EXAM: PORTABLE CHEST 1 VIEW  COMPARISON:  Radiograph  10/19/2018 FINDINGS: Heterogeneous opacities in the mid lower lung zone predominant distribution, right greater than left. Normal mediastinal contours with upper normal heart size. No pulmonary edema, large pleural effusion or pneumothorax. Surgical clips at the thoracic inlet. IMPRESSION: Heterogeneous mid and lower lung zone predominant opacities, suspicious for multifocal pneumonia, including COVID-19. Electronically Signed   By: Keith Rake M.D.   On: 06/10/2019 03:20    Procedures Procedures (including critical care time)  Medications Ordered in ED Medications  aspirin EC tablet 81 mg (has no administration in time range)  propranolol ER (INDERAL LA) SR capsule 160 mg (has no administration in time range)  simvastatin (ZOCOR) tablet 20 mg (has no administration in time range)  busPIRone (BUSPAR) tablet 5 mg (has no administration in time range)  FLUoxetine (PROZAC) tablet 10 mg (has no administration in time range)  pantoprazole (PROTONIX) EC tablet 40 mg (has no administration in time range)  cholecalciferol (VITAMIN D3) tablet 1,000 Units (has no administration in time range)  enoxaparin (LOVENOX) injection 40 mg (has no administration in time range)  dexamethasone (DECADRON) injection 6 mg (6 mg Intravenous Given 06/10/19 0610)  vitamin C (ASCORBIC ACID) tablet 500 mg (has no administration in time range)  zinc sulfate capsule 220 mg (has no administration in time range)  acetaminophen (TYLENOL) tablet 650 mg (has no administration in time range)  ondansetron (ZOFRAN) injection 4 mg (4 mg Intravenous Given 06/10/19 0253)  acetaminophen (TYLENOL) tablet 1,000 mg (1,000 mg Oral Given 06/10/19 0418)     Initial Impression / Assessment and Plan / ED Course  I have reviewed the triage vital signs and the nursing notes.  Pertinent labs & imaging results that were available during my care of the patient were reviewed by me and considered in my medical decision making (see chart for  details).        38 year old female with a history of prediabetes, HTN, GERD, kidney stones, hyperparathyroidism, and HLD who presents to the emergency department with fever, chills, body aches, shortness of breath, chest tightness, and loss of sense of taste and smell.  She is on 2 L nasal cannula on arrival.  At rest on room air, she is hypoxic at 87% with tachypnea.  Afebrile in the ER without tachycardia.  Chest x-ray with multifocal pneumonia in the mid and lower lungs, right greater than left.  COVID-19 test is positive.  Labs are otherwise grossly unremarkable.  She does not appear dehydrated.  Given hypoxia, consulted Dr. Marlowe Sax with the hospitalist team who will accept the patient for admission.  Patient has remained comfortable on 2 L nasal cannula with sats 92-94%. The patient appears reasonably stabilized for admission considering the current resources, flow, and capabilities available in the ED at this time, and I doubt any other [facility] requiring further screening and/or treatment in the ED prior to admission.   Final Clinical Impressions(s) / ED Diagnoses   Final diagnoses:  Pneumonia due to COVID-19 virus  Hypoxia    ED Discharge Orders    None       Joanne Gavel, PA-C 06/10/19 0659    Ward, Delice Bison, DO 06/10/19 DX:[id number]

## 2019-06-10 NOTE — ED Notes (Signed)
Report called to [facility] RN.

## 2019-06-10 NOTE — H&P (Signed)
History and Physical    NICOLET NEGA [id number] DOB: [birth year] DOA: 06/10/2019  PCP: [name], NP Patient coming from: Home  Chief Complaint: Shortness of breath  HPI: Debbie Walter is a 38 y.o. female with medical history significant of hypertension, GERD, uterine fibroids, PCOS, nephrolithiasis presenting to the hospital with a chief complaint of shortness of breath.  Patient reports 6-day history of fevers, chills, body aches, and loss of taste and smell.  States 2 to 3 days ago she started having shortness of breath and cough.  She went to an urgent care center and they did testing for coronavirus but results are currently pending.  Her symptoms continue to worsen so she decided to come into the ED.  She also vomited twice a day or two ago.  No nausea or vomiting at present.  Denies abdominal pain.  ED Course: Slightly tachypneic.  SPO2 87-88% on room air.  Placed 2 L supplemental oxygen.  SARS-CoV-2 test positive.  Afebrile and no leukocytosis.  Chest x-ray showing heterogenous mid and lower lung zone predominant opacities suspicious for multifocal pneumonia. Patient received Tylenol and Zofran.  Review of Systems:  All systems reviewed and apart from history of presenting illness, are negative.  Past Medical History:  Diagnosis Date  . Fibroids   . GERD (gastroesophageal reflux disease)    takes protonix  . History of kidney stones   . Hypertension   . MRSA infection    under arm  . Panic attacks   . PCOS (polycystic ovarian syndrome)    takes metformin for this    Past Surgical History:  Procedure Laterality Date  . CESAREAN SECTION    . CESAREAN SECTION WITH BILATERAL TUBAL LIGATION Bilateral 05/15/2014   Procedure: CESAREAN SECTION WITH BILATERAL TUBAL LIGATION;  Surgeon: Cyril Mourning, MD;  Location: [facility];  Service: Obstetrics;  Laterality: Bilateral;  repeat  edc 06/10/14  . CHOLECYSTECTOMY N/A 10/31/2017   Procedure: LAPAROSCOPIC CHOLECYSTECTOMY;   Surgeon: Coralie Keens, MD;  Location: [place];  Service: General;  Laterality: N/A;  . HYSTEROSCOPY    . PARATHYROIDECTOMY     REMOVED PART OF THYROID   . PARATHYROIDECTOMY       reports that she has never smoked. She has never used smokeless tobacco. She reports that she does not drink alcohol or use drugs.  Allergies  Allergen Reactions  . Procardia [Nifedipine] Other (See Comments)    headache  . Wellbutrin [Bupropion] Other (See Comments)    "Makes my blood pressure go up - I don't ever want to take it again"  . Labetalol Other (See Comments)    "makes me feel like things are crawling through my head"  . Hydromorphone Other (See Comments)    Makes pt feel out of this world. Patient states it needs to be pushed slow, but she can take it.  . Morphine And Related     Needs to be pushed slow or pt feels like she will code  . Sudafed [Pseudoephedrine Hcl]     Heart race / dries out mucus membranes  . Zithromax [Azithromycin] Other (See Comments)    Gi upset.  Diarrhea    Family History  Problem Relation Age of Onset  . Hyperlipidemia Mother   . Heart disease Mother   . Diabetes Mother   . Hypertension Mother   . Hyperlipidemia Father   . Hypertension Father   . Cancer Father        rectal  . Hypertension  Sister   . Anesthesia problems Neg Hx   . Hypotension Neg Hx   . Malignant hyperthermia Neg Hx   . Pseudochol deficiency Neg Hx     Prior to Admission medications   Medication Sig Start Date End Date Taking? Authorizing Provider  amoxicillin-clavulanate (AUGMENTIN) 875-125 MG tablet Take 1 tablet by mouth 2 (two) times daily. 06/07/19  Yes [provider]  aspirin EC 81 MG tablet Take 81 mg by mouth daily.   Yes [provider]  busPIRone (BUSPAR) 5 MG tablet TAKE 1 TABLET BY MOUTH EVERY DAY Patient taking differently: Take 5 mg by mouth daily.  03/25/19  Yes [name], NP  cholecalciferol (VITAMIN D) 1000 UNITS tablet Take 1,000 Units by mouth  daily.    Yes [provider]  cyanocobalamin 500 MCG tablet Take 1 tablet (500 mcg total) by mouth daily. 03/23/16  Yes Baity, Coralie Keens, NP  FLUoxetine (PROZAC) 10 MG tablet TAKE 1 TABLET BY MOUTH EVERY DAY Patient taking differently: Take 10 mg by mouth daily.  03/23/19  Yes [name], NP  metFORMIN (GLUCOPHAGE) 500 MG tablet Take 1 tablet (500 mg total) by mouth 2 (two) times daily with a meal. 01/14/15  Yes Philemon Kingdom, MD  nitroGLYCERIN (NITROSTAT) 0.4 MG SL tablet Place 0.4 mg under the tongue every 5 (five) minutes as needed for chest pain.  02/29/16  Yes [provider]  Omega-3 Fatty Acids (FISH OIL) 1000 MG CAPS Take 1 capsule by mouth daily.   Yes [provider]  pantoprazole (PROTONIX) 40 MG tablet TAKE 1 TABLET BY MOUTH EVERY DAY Patient taking differently: Take 40 mg by mouth daily.  06/05/19  Yes Baity, Coralie Keens, NP  propranolol ER (INDERAL LA) 160 MG SR capsule Take 1 capsule (160 mg total) by mouth daily. 12/02/18  Yes Adrian Prows, MD  simvastatin (ZOCOR) 40 MG tablet Take 20 mg by mouth every evening. 04/17/19  Yes [provider]  spironolactone (ALDACTONE) 50 MG tablet Take 1 tablet (50 mg total) by mouth daily. 03/06/19  Yes Miquel Dunn, NP  valsartan (DIOVAN) 320 MG tablet TAKE 1 TABLET BY MOUTH EVERY DAY Patient taking differently: Take 320 mg by mouth daily.  03/17/19  Yes Miquel Dunn, NP    Physical Exam: Vitals:   06/10/19 0227 06/10/19 0330 06/10/19 0500 06/10/19 0530  BP: 127/65 (!) 158/95 (!) 149/93 (!) 162/87  Pulse: 89 84 87 86  Resp: (!) 22 (!) 22 (!) 34 (!) 28  Temp: 99.8 F (37.7 C)     TempSrc: Oral     SpO2: 93% 94% 92% (!) 87%    Physical Exam  Constitutional: She is oriented to person, place, and time. She appears well-developed and well-nourished. No distress.  HENT:  Head: Normocephalic.  Mouth/Throat: Oropharynx is clear and moist.  Eyes: Right eye exhibits no discharge. Left eye  exhibits no discharge.  Neck: Neck supple.  Cardiovascular: Normal rate, regular rhythm and intact distal pulses.  Pulmonary/Chest: She has no wheezes.  Speaking clearly in full sentences On 2 L supplemental oxygen Crackles appreciated at the bases  Abdominal: Soft. Bowel sounds are normal. She exhibits no distension. There is no abdominal tenderness. There is no guarding.  Musculoskeletal:        General: No edema.  Neurological: She is alert and oriented to person, place, and time.  Skin: Skin is warm and dry. She is not diaphoretic.     Labs on Admission: I have  personally reviewed following labs and imaging studies  CBC: Recent Labs  Lab 06/10/19 0250  WBC 4.4  NEUTROABS 3.1  HGB 13.3  HCT 39.9  MCV 81.9  PLT [id number]   Basic Metabolic Panel: Recent Labs  Lab 06/10/19 0250  NA 137  K 3.9  CL 103  CO2 21*  GLUCOSE 127*  BUN 5*  CREATININE 0.76  CALCIUM 8.8*   GFR: CrCl cannot be calculated (Unknown ideal weight.). Liver Function Tests: Recent Labs  Lab 06/10/19 0250  AST 28  ALT 24  ALKPHOS 49  BILITOT 0.3  PROT 6.9  ALBUMIN 3.6   Recent Labs  Lab 06/10/19 0250  LIPASE 27   No results for input(s): AMMONIA in the last 168 hours. Coagulation Profile: No results for input(s): INR, PROTIME in the last 168 hours. Cardiac Enzymes: No results for input(s): CKTOTAL, CKMB, CKMBINDEX, TROPONINI in the last 168 hours. BNP (last 3 results) No results for input(s): PROBNP in the last 8760 hours. HbA1C: No results for input(s): HGBA1C in the last 72 hours. CBG: Recent Labs  Lab 06/10/19 0245  GLUCAP 118*   Lipid Profile: No results for input(s): CHOL, HDL, LDLCALC, TRIG, CHOLHDL, LDLDIRECT in the last 72 hours. Thyroid Function Tests: No results for input(s): TSH, T4TOTAL, FREET4, T3FREE, THYROIDAB in the last 72 hours. Anemia Panel: No results for input(s): VITAMINB12, FOLATE, FERRITIN, TIBC, IRON, RETICCTPCT in the last 72 hours. Urine analysis:     Component Value Date/Time   COLORURINE YELLOW 06/10/2019 0250   APPEARANCEUR CLEAR 06/10/2019 0250   LABSPEC 1.006 06/10/2019 0250   PHURINE 7.0 06/10/2019 0250   GLUCOSEU NEGATIVE 06/10/2019 0250   HGBUR MODERATE (A) 06/10/2019 0250   BILIRUBINUR NEGATIVE 06/10/2019 0250   KETONESUR NEGATIVE 06/10/2019 0250   PROTEINUR 100 (A) 06/10/2019 0250   UROBILINOGEN 0.2 04/28/2014 1620   NITRITE NEGATIVE 06/10/2019 0250   LEUKOCYTESUR NEGATIVE 06/10/2019 0250    Radiological Exams on Admission: Dg Chest Portable 1 View  Result Date: 06/10/2019 CLINICAL DATA:  Fever and shortness of breath. EXAM: PORTABLE CHEST 1 VIEW COMPARISON:  Radiograph 10/19/2018 FINDINGS: Heterogeneous opacities in the mid lower lung zone predominant distribution, right greater than left. Normal mediastinal contours with upper normal heart size. No pulmonary edema, large pleural effusion or pneumothorax. Surgical clips at the thoracic inlet. IMPRESSION: Heterogeneous mid and lower lung zone predominant opacities, suspicious for multifocal pneumonia, including COVID-19. Electronically Signed   By: Keith Rake M.D.   On: 06/10/2019 03:20    EKG: Independently reviewed.  Sinus rhythm.  Assessment/Plan Principal Problem:   Pneumonia due to COVID-19 virus Active Problems:   HTN (hypertension)   Anxiety and depression   GERD (gastroesophageal reflux disease)   Acute respiratory failure with hypoxia (HCC)   Acute hypoxic respiratory failure secondary to COVID-19 viral multifocal pneumonia SPO2 87-88% on room air.  Placed 2 L supplemental oxygen.  SARS-CoV-2 test positive.  Afebrile and no leukocytosis.  Chest x-ray showing heterogenous mid and lower lung zone predominant opacities suspicious for multifocal pneumonia.  -IV dexamethasone 6 mg daily -Pharmacy consult for Remdesivir has been placed -Check inflammatory markers STAT including ferritin, fibrinogen, d-dimer, troponin, LDH, and CRP -Start Actemra if CRP  greater than 7 -Daily CBC, CMP, LDH, d-dimer, ferritin -Vitamin C and zinc -Prone positioning -Airborne and contact precautions -Continuous pulse ox -Supplemental oxygen  Hypertension Systolic currently in the 140s to 160s. -Continue propranolol  GERD -Continue PPI  Prediabetes -Last A1c 6.5 on 03/26/2019.  Random blood glucose 127.  Anxiety and depression -Continue home BuSpar, fluoxetine  DVT prophylaxis: Lovenox Code Status: Full code Family Communication: No family available. Disposition Plan: Anticipate discharge after clinical improvement. Consults called: None Admission status: It is my clinical opinion that admission to INPATIENT is reasonable and necessary in this 38 y.o. female . presenting with acute hypoxic respiratory failure secondary to COVID-19 viral pneumonia.  Has a new supplemental oxygen requirement.  High risk of decompensation.  Given the aforementioned, the predictability of an adverse outcome is felt to be significant. I expect that the patient will require at least 2 midnights in the hospital to treat this condition.   The medical decision making on this patient was of high complexity and the patient is at high risk for clinical deterioration, therefore this is a level 3 visit.  Shela Leff MD [facility] Pager [phone]  If 7PM-7AM, please contact night-coverage [email or web address] Password [facility]  06/10/2019, 6:33 AM

## 2019-06-10 NOTE — Telephone Encounter (Signed)
She has to be seen for a hospital follow up. Can be virtual. Forms will be completed at that time.

## 2019-06-10 NOTE — ED Notes (Signed)
Called [facility] for status of transport

## 2019-06-10 NOTE — Progress Notes (Signed)
Ok to increase lovenox to 50mg  SQ qday per Dr. Barbie Banner due to BMI 43.  Onnie Boer, PharmD, BCIDP, AAHIVP, CPP Infectious Disease Pharmacist 06/10/2019 2:42 PM

## 2019-06-10 NOTE — Progress Notes (Signed)
Pt was admitted for COVID. Baseline sats<94% on RA and positive for LRTI per MD. Remdesivir has been ordered. ALT wnl, scr<1.  Remdesivir 200mg  IV x1 then 100mg  IV q24 x4d F/u with ALT  Onnie Boer, PharmD, BCIDP, AAHIVP, CPP Infectious Disease Pharmacist 06/10/2019 12:34 PM

## 2019-06-10 NOTE — ED Notes (Signed)
Called [facility] for transport to [facility]

## 2019-06-10 NOTE — ED Triage Notes (Signed)
Pt arrived via [facility]; pt from [facility]  With c/o N/V, temp, increased body aches over last week; tested for Covid on Sat with no results; worsening SOB with exertion; 132/95; 88; 20; 98; 98.9; EMS reports pt took tylenol approx 530p unknown dosage.

## 2019-06-10 NOTE — Telephone Encounter (Signed)
fmla paperwork in regina's in box °

## 2019-06-10 NOTE — Telephone Encounter (Signed)
noted 

## 2019-06-10 NOTE — Telephone Encounter (Signed)
Patient called and stated that she is in the hospital due to testing positive for Covid and also having pneumonia.  Her work will be sending over paperwork to be filled out due to her having to be out of work  They will be faxing this over to [place]   [employer]

## 2019-06-11 DIAGNOSIS — J9601 Acute respiratory failure with hypoxia: Secondary | ICD-10-CM | POA: Diagnosis not present

## 2019-06-11 DIAGNOSIS — I10 Essential (primary) hypertension: Secondary | ICD-10-CM

## 2019-06-11 DIAGNOSIS — J1289 Other viral pneumonia: Secondary | ICD-10-CM | POA: Diagnosis not present

## 2019-06-11 DIAGNOSIS — F419 Anxiety disorder, unspecified: Secondary | ICD-10-CM | POA: Diagnosis not present

## 2019-06-11 DIAGNOSIS — U071 COVID-19: Secondary | ICD-10-CM | POA: Diagnosis not present

## 2019-06-11 LAB — CBC WITH DIFFERENTIAL/PLATELET
Abs Immature Granulocytes: 0.16 10*3/uL — ABNORMAL HIGH (ref 0.00–0.07)
Basophils Absolute: 0 10*3/uL (ref 0.0–0.1)
Basophils Relative: 0 %
Eosinophils Absolute: 0 10*3/uL (ref 0.0–0.5)
Eosinophils Relative: 0 %
HCT: 37.5 % (ref 36.0–46.0)
Hemoglobin: 11.7 g/dL — ABNORMAL LOW (ref 12.0–15.0)
Immature Granulocytes: 3 %
Lymphocytes Relative: 22 %
Lymphs Abs: 1.1 10*3/uL (ref 0.7–4.0)
MCH: 26.2 pg (ref 26.0–34.0)
MCHC: 31.2 g/dL (ref 30.0–36.0)
MCV: 83.9 fL (ref 80.0–100.0)
Monocytes Absolute: 0.5 10*3/uL (ref 0.1–1.0)
Monocytes Relative: 9 %
Neutro Abs: 3.4 10*3/uL (ref 1.7–7.7)
Neutrophils Relative %: 66 %
Platelets: 276 10*3/uL (ref 150–400)
RBC: 4.47 MIL/uL (ref 3.87–5.11)
RDW: 14.4 % (ref 11.5–15.5)
WBC: 5.2 10*3/uL (ref 4.0–10.5)
nRBC: 0 % (ref 0.0–0.2)

## 2019-06-11 LAB — COMPREHENSIVE METABOLIC PANEL
ALT: 20 U/L (ref 0–44)
AST: 23 U/L (ref 15–41)
Albumin: 3.4 g/dL — ABNORMAL LOW (ref 3.5–5.0)
Alkaline Phosphatase: 45 U/L (ref 38–126)
Anion gap: 12 (ref 5–15)
BUN: 13 mg/dL (ref 6–20)
CO2: 25 mmol/L (ref 22–32)
Calcium: 8.4 mg/dL — ABNORMAL LOW (ref 8.9–10.3)
Chloride: 105 mmol/L (ref 98–111)
Creatinine, Ser: 0.78 mg/dL (ref 0.44–1.00)
GFR calc Af Amer: >60 mL/min (ref >60)
GFR calc non Af Amer: >60 mL/min (ref >60)
Glucose, Bld: 87 mg/dL (ref 70–99)
Potassium: 4.3 mmol/L (ref 3.5–5.1)
Sodium: 142 mmol/L (ref 135–145)
Total Bilirubin: 0.3 mg/dL (ref 0.3–1.2)
Total Protein: 7.2 g/dL (ref 6.5–8.1)

## 2019-06-11 LAB — D-DIMER, QUANTITATIVE: D-Dimer, Quant: 0.27 ug/mL-FEU (ref 0.00–0.50)

## 2019-06-11 LAB — GLUCOSE, CAPILLARY
Glucose-Capillary: 151 mg/dL — ABNORMAL HIGH (ref 70–99)
Glucose-Capillary: 127 mg/dL — ABNORMAL HIGH (ref 70–99)
Glucose-Capillary: 133 mg/dL — ABNORMAL HIGH (ref 70–99)
Glucose-Capillary: 136 mg/dL — ABNORMAL HIGH (ref 70–99)
Glucose-Capillary: 148 mg/dL — ABNORMAL HIGH (ref 70–99)

## 2019-06-11 LAB — FERRITIN: Ferritin: 164 ng/mL (ref 11–307)

## 2019-06-11 MED ORDER — FLUOXETINE HCL 10 MG PO CAPS
10.0000 mg | ORAL_CAPSULE | Freq: Every day | ORAL | Status: DC
  Administered 2019-06-11 – 2019-06-15 (×5): 10 mg via ORAL
  Filled 2019-06-11 (×6): qty 1

## 2019-06-11 MED ORDER — ONDANSETRON HCL 4 MG/2ML IJ SOLN: 4.0000 mg | Freq: Four times a day (QID) | INTRAMUSCULAR | Status: DC | PRN

## 2019-06-11 NOTE — Progress Notes (Signed)
Pt's husband updated on plan of care.  He appreciated the update.

## 2019-06-11 NOTE — Progress Notes (Signed)
[facility] PROGRESS NOTE    Progress Note  Debbie Walter  NAT:[id number] DOB: [birth year] DOA: 06/10/2019 PCP: [name], NP     Brief Narrative:   Debbie Walter is an 38 y.o. female past medical history significant for hypertension, uterine fibroid, polycystic ovarian syndrome, nephrolithiasis presented to the hospital with chief complaint of fever chills and body aches that started 3 days prior to admission she eventually developed shortness of breath 2 to 3 days prior to admission went to urgent care was set for COVID-19 on 06/09/2019, she went home and started vomiting and throwing up came back to the ED was found to be satting 87% on room air.  COVID-19 medication: 06/09/2019 IV Remdesivir 06/09/2019 IV Decadron  Assessment/Plan:   Acute respiratory failure with hypoxia secondary to Pneumonia due to COVID-19 virus She is currently requiring 2 L of oxygen greater 92%. Inflammatory markers are elevated. Continue IV Decadron and Remdesivir. Vitamin C and zinc are elevated. Try to keep the patient prone for at least 16 hours a day.  Essential  HTN (hypertension) Continue propranolol blood pressures stable.  GERD: Continue PPI.  Diabetes mellitus type 2: A1c was 6.5. Continue sliding scale  Anxiety and depression Continue BuSpar and Prozac.    DVT prophylaxis: lovenox Family Communication:none Disposition Plan/Barrier to D/C: once she is off oxygen and completed her Remdesivir treatment Code Status:     Code Status Orders  (From admission, onward)         Start     Ordered   06/10/19 0534  Full code  Continuous     06/10/19 0537        Code Status History    Date Active Date Inactive Code Status Order ID Comments User Context   05/15/2014 1626 05/17/2014 1527 Full Code [id number]  Cyril Mourning, MD Inpatient   04/28/2014 2027 05/01/2014 1604 Full Code [id number]  Manya Silvas, CNM Inpatient   Advance Care Planning Activity        IV  Access:    Peripheral IV   Procedures and diagnostic studies:   Dg Chest Portable 1 View  Result Date: 06/10/2019 CLINICAL DATA:  Fever and shortness of breath. EXAM: PORTABLE CHEST 1 VIEW COMPARISON:  Radiograph 10/19/2018 FINDINGS: Heterogeneous opacities in the mid lower lung zone predominant distribution, right greater than left. Normal mediastinal contours with upper normal heart size. No pulmonary edema, large pleural effusion or pneumothorax. Surgical clips at the thoracic inlet. IMPRESSION: Heterogeneous mid and lower lung zone predominant opacities, suspicious for multifocal pneumonia, including COVID-19. Electronically Signed   By: Keith Rake M.D.   On: 06/10/2019 03:20     Medical Consultants:    None.  Anti-Infectives:   none  Subjective:    Debbie Walter she relates her breathing is significantly improved compared to yesterday.  She still desaturates when she moves to sit in the chair or goes to the bathroom  Objective:    Vitals:   06/10/19 1542 06/10/19 1944 06/11/19 0430 06/11/19 0500  BP: (!) 142/84 129/79 119/64   Pulse: 83 81    Resp: (!) 21 18 (!) 27 20  Temp: 98.9 F (37.2 C) 98.7 F (37.1 C) 99 F (37.2 C)   TempSrc: Oral Oral Oral   SpO2: 91% 94% 94%   Weight:      Height:       SpO2: 94 % O2 Flow Rate (L/min): 2 L/min   Intake/Output Summary (Last 24 hours) at 06/11/2019 0742 Last  data filed at 06/10/2019 1944 Gross per 24 hour  Intake 490 ml  Output -  Net 490 ml   Filed Weights   06/10/19 1300  Weight: 104.3 kg    Exam: General exam: In no acute distress. Respiratory system: Good air movement and diffuse crackles Cardiovascular system: S1 & S2 heard, RRR. No JVD.  Gastrointestinal system: Abdomen is nondistended, soft and nontender.  Central nervous system: Alert and oriented. No focal neurological deficits. Extremities: No pedal edema. Skin: No rashes, lesions or ulcers Psychiatry: Judgement and insight appear  normal. Mood & affect appropriate.   Data Reviewed:    Labs: Basic Metabolic Panel: Recent Labs  Lab 06/10/19 0250  NA 137  K 3.9  CL 103  CO2 21*  GLUCOSE 127*  BUN 5*  CREATININE 0.76  CALCIUM 8.8*   GFR Estimated Creatinine Clearance: 103.9 mL/min (by C-G formula based on SCr of 0.76 mg/dL). Liver Function Tests: Recent Labs  Lab 06/10/19 0250  AST 28  ALT 24  ALKPHOS 49  BILITOT 0.3  PROT 6.9  ALBUMIN 3.6   Recent Labs  Lab 06/10/19 0250  LIPASE 27   No results for input(s): AMMONIA in the last 168 hours. Coagulation profile No results for input(s): INR, PROTIME in the last 168 hours. COVID-19 Labs  Recent Labs    06/10/19 0533 06/11/19 0245  DDIMER 0.51* 0.27  FERRITIN 144  --   LDH 341*  --   CRP 6.5*  --     Lab Results  Component Value Date   SARSCOV2NAA POSITIVE (A) 06/10/2019    CBC: Recent Labs  Lab 06/10/19 0250 06/11/19 0245  WBC 4.4 5.2  NEUTROABS 3.1 3.4  HGB 13.3 11.7*  HCT 39.9 37.5  MCV 81.9 83.9  PLT 237 276   Cardiac Enzymes: No results for input(s): CKTOTAL, CKMB, CKMBINDEX, TROPONINI in the last 168 hours. BNP (last 3 results) No results for input(s): PROBNP in the last 8760 hours. CBG: Recent Labs  Lab 06/10/19 0245 06/10/19 1545 06/10/19 2115  GLUCAP 118* 122* 157*   D-Dimer: Recent Labs    06/10/19 0533 06/11/19 0245  DDIMER 0.51* 0.27   Hgb A1c: No results for input(s): HGBA1C in the last 72 hours. Lipid Profile: No results for input(s): CHOL, HDL, LDLCALC, TRIG, CHOLHDL, LDLDIRECT in the last 72 hours. Thyroid function studies: No results for input(s): TSH, T4TOTAL, T3FREE, THYROIDAB in the last 72 hours.  Invalid input(s): FREET3 Anemia work up: Recent Labs    06/10/19 0533  FERRITIN 144   Sepsis Labs: Recent Labs  Lab 06/10/19 0250 06/11/19 0245  WBC 4.4 5.2   Microbiology Recent Results (from the past 240 hour(s))  SARS Coronavirus 2 [facility] order, Performed in [facility]  hospital lab) Nasopharyngeal Nasopharyngeal Swab     Status: Abnormal   Collection Time: 06/10/19  2:45 AM   Specimen: Nasopharyngeal Swab  Result Value Ref Range Status   SARS Coronavirus 2 POSITIVE (A) NEGATIVE Final    Comment: RESULT CALLED TO, READ BACK BY AND VERIFIED WITH: RN DAVIS RAMON AT 0404 ON 06/10/2019 BY MESSAN HOUEGNIFIO (NOTE) If result is NEGATIVE SARS-CoV-2 target nucleic acids are NOT DETECTED. The SARS-CoV-2 RNA is generally detectable in upper and lower  respiratory specimens during the acute phase of infection. The lowest  concentration of SARS-CoV-2 viral copies this assay can detect is 250  copies / mL. A negative result does not preclude SARS-CoV-2 infection  and should not be used as the sole basis for  treatment or other  patient management decisions.  A negative result may occur with  improper specimen collection / handling, submission of specimen other  than nasopharyngeal swab, presence of viral mutation(s) within the  areas targeted by this assay, and inadequate number of viral copies  (<250 copies / mL). A negative result must be combined with clinical  observations, patient history, and epidemiological information. If result is POSITIVE SARS-CoV-2 target nucleic a cids are DETECTED. The SARS-CoV-2 RNA is generally detectable in upper and lower  respiratory specimens during the acute phase of infection.  Positive  results are indicative of active infection with SARS-CoV-2.  Clinical  correlation with patient history and other diagnostic information is  necessary to determine patient infection status.  Positive results do  not rule out bacterial infection or co-infection with other viruses. If result is PRESUMPTIVE POSTIVE SARS-CoV-2 nucleic acids MAY BE PRESENT.   A presumptive positive result was obtained on the submitted specimen  and confirmed on repeat testing.  While 2019 novel coronavirus  (SARS-CoV-2) nucleic acids may be present in the  submitted sample  additional confirmatory testing may be necessary for epidemiological  and / or clinical management purposes  to differentiate between  SARS-CoV-2 and other Sarbecovirus currently known to infect humans.  If clinically indicated additional testing with an alternate test  met hodology [phone]) is advised. The SARS-CoV-2 RNA is generally  detectable in upper and lower respiratory specimens during the acute  phase of infection. The expected result is Negative. Fact Sheet for Patients:  StrictlyIdeas.no Fact Sheet for Healthcare Providers: BankingDealers.co.za This test is not yet approved or cleared by the [place] FDA and has been authorized for detection and/or diagnosis of SARS-CoV-2 by FDA under an Emergency Use Authorization (EUA).  This EUA will remain in effect (meaning this test can be used) for the duration of the COVID-19 declaration under Section 564(b)(1) of the Act, 21 U.S.C. section 360bbb-3(b)(1), unless the authorization is terminated or revoked sooner. Performed at [facility], [place] [address], [place], [place] [address]   MRSA PCR Screening     Status: None   Collection Time: 06/10/19 12:31 PM   Specimen: Nasal Mucosa; Nasopharyngeal  Result Value Ref Range Status   MRSA by PCR NEGATIVE NEGATIVE Final    Comment:        The GeneXpert MRSA Assay (FDA approved for NASAL specimens only), is one component of a comprehensive MRSA colonization surveillance program. It is not intended to diagnose MRSA infection nor to guide or monitor treatment for MRSA infections. Performed at [facility], [place] [address] [place], [place] [address]      Medications:   . aspirin EC  81 mg Oral Daily  . busPIRone  5 mg Oral Daily  . cholecalciferol  1,000 Units Oral Daily  . dexamethasone (DECADRON) injection  6 mg Intravenous Q24H  . enoxaparin (LOVENOX) injection  50 mg Subcutaneous  Q24H  . FLUoxetine  10 mg Oral Daily  . pantoprazole  40 mg Oral Daily  . propranolol ER  160 mg Oral Daily  . simvastatin  20 mg Oral QPM  . vitamin B-12  500 mcg Oral Daily  . vitamin C  500 mg Oral Daily  . zinc sulfate  220 mg Oral Daily   Continuous Infusions: . remdesivir 100 mg in NS 250 mL        LOS: 1 day   Charlynne Cousins  [facility]  06/11/2019, 7:42 AM

## 2019-06-11 NOTE — Plan of Care (Signed)
  Problem: Respiratory: Goal: Will maintain a patent airway Outcome: Progressing Goal: Complications related to the disease process, condition or treatment will be avoided or minimized Outcome: Progressing   

## 2019-06-12 ENCOUNTER — Telehealth: Payer: Self-pay | Admitting: Internal Medicine

## 2019-06-12 DIAGNOSIS — I10 Essential (primary) hypertension: Secondary | ICD-10-CM | POA: Diagnosis not present

## 2019-06-12 DIAGNOSIS — U071 COVID-19: Secondary | ICD-10-CM | POA: Diagnosis not present

## 2019-06-12 DIAGNOSIS — F419 Anxiety disorder, unspecified: Secondary | ICD-10-CM | POA: Diagnosis not present

## 2019-06-12 DIAGNOSIS — F329 Major depressive disorder, single episode, unspecified: Secondary | ICD-10-CM

## 2019-06-12 DIAGNOSIS — J9601 Acute respiratory failure with hypoxia: Secondary | ICD-10-CM | POA: Diagnosis not present

## 2019-06-12 LAB — COMPREHENSIVE METABOLIC PANEL
ALT: 23 U/L (ref 0–44)
AST: 23 U/L (ref 15–41)
Albumin: 3.4 g/dL — ABNORMAL LOW (ref 3.5–5.0)
Alkaline Phosphatase: 45 U/L (ref 38–126)
Anion gap: 10 (ref 5–15)
BUN: 19 mg/dL (ref 6–20)
CO2: 25 mmol/L (ref 22–32)
Calcium: 8.1 mg/dL — ABNORMAL LOW (ref 8.9–10.3)
Chloride: 105 mmol/L (ref 98–111)
Creatinine, Ser: 0.76 mg/dL (ref 0.44–1.00)
GFR calc Af Amer: >60 mL/min (ref >60)
GFR calc non Af Amer: >60 mL/min (ref >60)
Glucose, Bld: 103 mg/dL — ABNORMAL HIGH (ref 70–99)
Potassium: 3.9 mmol/L (ref 3.5–5.1)
Sodium: 140 mmol/L (ref 135–145)
Total Bilirubin: 0.2 mg/dL — ABNORMAL LOW (ref 0.3–1.2)
Total Protein: 7.1 g/dL (ref 6.5–8.1)

## 2019-06-12 LAB — CBC WITH DIFFERENTIAL/PLATELET
Abs Immature Granulocytes: 0.26 10*3/uL — ABNORMAL HIGH (ref 0.00–0.07)
Basophils Absolute: 0 10*3/uL (ref 0.0–0.1)
Basophils Relative: 0 %
Eosinophils Absolute: 0 10*3/uL (ref 0.0–0.5)
Eosinophils Relative: 0 %
HCT: 39.4 % (ref 36.0–46.0)
Hemoglobin: 12.2 g/dL (ref 12.0–15.0)
Immature Granulocytes: 5 %
Lymphocytes Relative: 22 %
Lymphs Abs: 1.2 10*3/uL (ref 0.7–4.0)
MCH: 26.2 pg (ref 26.0–34.0)
MCHC: 31 g/dL (ref 30.0–36.0)
MCV: 84.7 fL (ref 80.0–100.0)
Monocytes Absolute: 0.4 10*3/uL (ref 0.1–1.0)
Monocytes Relative: 7 %
Neutro Abs: 3.6 10*3/uL (ref 1.7–7.7)
Neutrophils Relative %: 66 %
Platelets: 349 10*3/uL (ref 150–400)
RBC: 4.65 MIL/uL (ref 3.87–5.11)
RDW: 14.5 % (ref 11.5–15.5)
WBC: 5.5 10*3/uL (ref 4.0–10.5)
nRBC: 0 % (ref 0.0–0.2)

## 2019-06-12 LAB — FERRITIN: Ferritin: 186 ng/mL (ref 11–307)

## 2019-06-12 LAB — C-REACTIVE PROTEIN: CRP: 1.6 mg/dL — ABNORMAL HIGH (ref <1.0)

## 2019-06-12 LAB — D-DIMER, QUANTITATIVE: D-Dimer, Quant: 0.35 ug/mL-FEU (ref 0.00–0.50)

## 2019-06-12 LAB — GLUCOSE, CAPILLARY
Glucose-Capillary: 107 mg/dL — ABNORMAL HIGH (ref 70–99)
Glucose-Capillary: 127 mg/dL — ABNORMAL HIGH (ref 70–99)
Glucose-Capillary: 105 mg/dL — ABNORMAL HIGH (ref 70–99)
Glucose-Capillary: 161 mg/dL — ABNORMAL HIGH (ref 70–99)

## 2019-06-12 MED ORDER — CHLORHEXIDINE GLUCONATE CLOTH 2 % EX PADS: 6.0000 | MEDICATED_PAD | Freq: Every day | CUTANEOUS | Status: DC

## 2019-06-12 NOTE — Telephone Encounter (Signed)
[employer] faxed short term disability paperwork In regina's in box for review and signature

## 2019-06-12 NOTE — Telephone Encounter (Signed)
Done, placed in my outbox.

## 2019-06-12 NOTE — Plan of Care (Signed)
Spoke with patient's husband and discussed current POC and status.  No questions at this time.

## 2019-06-12 NOTE — Progress Notes (Addendum)
[facility] PROGRESS NOTE    Progress Note  Debbie Walter  KGS:[id number] DOB: 17-May-1981 DOA: 06/10/2019 PCP: [name], NP     Brief Narrative:   Debbie Walter is an 38 y.o. female past medical history significant for hypertension, uterine fibroid, polycystic ovarian syndrome, nephrolithiasis presented to the hospital with chief complaint of fever chills and body aches that started 3 days prior to admission she eventually developed shortness of breath 2 to 3 days prior to admission went to urgent care was set for COVID-19 on 06/09/2019, she went home and started vomiting and throwing up came back to the ED was found to be satting 87% on room air.  COVID-19 medication: 06/09/2019 IV Remdesivir 06/09/2019 IV Decadron  Assessment/Plan:   Acute respiratory failure with hypoxia secondary to Pneumonia due to COVID-19 virus Still requiring 2 L of oxygen to keep saturations greater than 92%. Her inflammatory markers are trending down. Continue IV Remdesivir for total of 5 days, continue IV Decadron. Continue vitamin C and zinc. Try to keep patient prone for at least 16 hours a day.  Essential  HTN (hypertension) Continue propranolol blood pressures stable.  GERD: Continue PPI.  Newly diagnosed diabetes mellitus type 2: A1c was 6.5. Excellent control of her blood glucose in house, continue sliding scale. At home she is on metformin for polycystic ovarian syndrome.  Polycystic ovarian syndrome: We are holding metformin for this admission can be resumed as an outpatient at a higher dose.  Anxiety and depression Continue BuSpar and Prozac.  Morbid obesity: She has been counseled.   DVT prophylaxis: lovenox Family Communication:none Disposition Plan/Barrier to D/C: once she is off oxygen and completed her Remdesivir treatment Code Status:     Code Status Orders  (From admission, onward)           Start     Ordered   06/10/19 0534  Full code  Continuous      06/10/19 0537           Code Status History     Date Active Date Inactive Code Status Order ID Comments User Context   05/15/2014 1626 05/17/2014 1527 Full Code [id number]  Cyril Mourning, MD Inpatient   04/28/2014 2027 05/01/2014 1604 Full Code [id number]  Manya Silvas, [place] Inpatient   Advance Care Planning Activity         IV Access:   Peripheral IV   Procedures and diagnostic studies:   No results found.   Medical Consultants:   None.  Anti-Infectives:   none  Subjective:    Debbie Walter she relates she still feels winded when going to the bathroom, she desatted when we took her oxygen off and she was at rest while speaking to me.  Objective:    Vitals:   06/11/19 1534 06/11/19 2104 06/12/19 0310 06/12/19 0730  BP: (!) 105/59 140/74 128/69 127/83  Pulse: 70 74 62 64  Resp: 20 16 20  (!) 23  Temp: 97.7 F (36.5 C) 98 F (36.7 C) 98.6 F (37 C) 98.6 F (37 C)  TempSrc: Axillary Oral Oral Axillary  SpO2: 92% 92% 90% 95%  Weight:      Height:       SpO2: 95 % O2 Flow Rate (L/min): 2 L/min   Intake/Output Summary (Last 24 hours) at 06/12/2019 0746 Last data filed at 06/12/2019 0625 Gross per 24 hour  Intake 970 ml  Output 400 ml  Net 570 ml   Filed Weights   06/10/19  1300  Weight: 104.3 kg    Exam: General exam: In no acute distress. Respiratory system: Good air movement and diffuse crackles bilaterally. Cardiovascular system: S1 & S2 heard, RRR. No JVD. Gastrointestinal system: Abdomen is nondistended, soft and nontender.  Central nervous system: Alert and oriented. No focal neurological deficits. Extremities: No pedal edema. Skin: No rashes, lesions or ulcers Psychiatry: Judgement and insight appear normal. Mood & affect appropriate.    Data Reviewed:    Labs: Basic Metabolic Panel: Recent Labs  Lab 06/10/19 0250 06/11/19 0245 06/12/19 0150  NA 137 142 140  K 3.9 4.3 3.9  CL 103 105 105  CO2 21* 25 25  GLUCOSE 127*  87 103*  BUN 5* 13 19  CREATININE 0.76 0.78 0.76  CALCIUM 8.8* 8.4* 8.1*   GFR Estimated Creatinine Clearance: 103.9 mL/min (by C-G formula based on SCr of 0.76 mg/dL). Liver Function Tests: Recent Labs  Lab 06/10/19 0250 06/11/19 0245 06/12/19 0150  AST 28 23 23   ALT 24 20 23   ALKPHOS 49 45 45  BILITOT 0.3 0.3 0.2*  PROT 6.9 7.2 7.1  ALBUMIN 3.6 3.4* 3.4*   Recent Labs  Lab 06/10/19 0250  LIPASE 27   No results for input(s): AMMONIA in the last 168 hours. Coagulation profile No results for input(s): INR, PROTIME in the last 168 hours. COVID-19 Labs  Recent Labs    06/10/19 0533 06/11/19 0245 06/12/19 0150  DDIMER 0.51* 0.27 0.35  FERRITIN 144 164 186  LDH 341*  --   --   CRP 6.5*  --   --     Lab Results  Component Value Date   SARSCOV2NAA POSITIVE (A) 06/10/2019    CBC: Recent Labs  Lab 06/10/19 0250 06/11/19 0245 06/12/19 0150  WBC 4.4 5.2 5.5  NEUTROABS 3.1 3.4 3.6  HGB 13.3 11.7* 12.2  HCT 39.9 37.5 39.4  MCV 81.9 83.9 84.7  PLT 237 276 349   Cardiac Enzymes: No results for input(s): CKTOTAL, CKMB, CKMBINDEX, TROPONINI in the last 168 hours. BNP (last 3 results) No results for input(s): PROBNP in the last 8760 hours. CBG: Recent Labs  Lab 06/11/19 0741 06/11/19 1537 06/11/19 1614 06/11/19 2041 06/12/19 0728  GLUCAP 127* 133* 136* 148* 107*   D-Dimer: Recent Labs    06/11/19 0245 06/12/19 0150  DDIMER 0.27 0.35   Hgb A1c: No results for input(s): HGBA1C in the last 72 hours. Lipid Profile: No results for input(s): CHOL, HDL, LDLCALC, TRIG, CHOLHDL, LDLDIRECT in the last 72 hours. Thyroid function studies: No results for input(s): TSH, T4TOTAL, T3FREE, THYROIDAB in the last 72 hours.  Invalid input(s): FREET3 Anemia work up: Recent Labs    06/11/19 0245 06/12/19 0150  FERRITIN 164 186   Sepsis Labs: Recent Labs  Lab 06/10/19 0250 06/11/19 0245 06/12/19 0150  WBC 4.4 5.2 5.5   Microbiology Recent Results (from  the past 240 hour(s))  SARS Coronavirus 2 [facility] order, Performed in [facility] hospital lab) Nasopharyngeal Nasopharyngeal Swab     Status: Abnormal   Collection Time: 06/10/19  2:45 AM   Specimen: Nasopharyngeal Swab  Result Value Ref Range Status   SARS Coronavirus 2 POSITIVE (A) NEGATIVE Final    Comment: RESULT CALLED TO, READ BACK BY AND VERIFIED WITH: RN DAVIS RAMON AT 0404 ON 06/10/2019 BY MESSAN HOUEGNIFIO (NOTE) If result is NEGATIVE SARS-CoV-2 target nucleic acids are NOT DETECTED. The SARS-CoV-2 RNA is generally detectable in upper and lower  respiratory specimens during the acute phase of  infection. The lowest  concentration of SARS-CoV-2 viral copies this assay can detect is 250  copies / mL. A negative result does not preclude SARS-CoV-2 infection  and should not be used as the sole basis for treatment or other  patient management decisions.  A negative result may occur with  improper specimen collection / handling, submission of specimen other  than nasopharyngeal swab, presence of viral mutation(s) within the  areas targeted by this assay, and inadequate number of viral copies  (<250 copies / mL). A negative result must be combined with clinical  observations, patient history, and epidemiological information. If result is POSITIVE SARS-CoV-2 target nucleic a cids are DETECTED. The SARS-CoV-2 RNA is generally detectable in upper and lower  respiratory specimens during the acute phase of infection.  Positive  results are indicative of active infection with SARS-CoV-2.  Clinical  correlation with patient history and other diagnostic information is  necessary to determine patient infection status.  Positive results do  not rule out bacterial infection or co-infection with other viruses. If result is PRESUMPTIVE POSTIVE SARS-CoV-2 nucleic acids MAY BE PRESENT.   A presumptive positive result was obtained on the submitted specimen  and confirmed on repeat testing.   While 2019 novel coronavirus  (SARS-CoV-2) nucleic acids may be present in the submitted sample  additional confirmatory testing may be necessary for epidemiological  and / or clinical management purposes  to differentiate between  SARS-CoV-2 and other Sarbecovirus currently known to infect humans.  If clinically indicated additional testing with an alternate test  met hodology [phone]) is advised. The SARS-CoV-2 RNA is generally  detectable in upper and lower respiratory specimens during the acute  phase of infection. The expected result is Negative. Fact Sheet for Patients:  StrictlyIdeas.no Fact Sheet for Healthcare Providers: BankingDealers.co.za This test is not yet approved or cleared by the [place] FDA and has been authorized for detection and/or diagnosis of SARS-CoV-2 by FDA under an Emergency Use Authorization (EUA).  This EUA will remain in effect (meaning this test can be used) for the duration of the COVID-19 declaration under Section 564(b)(1) of the Act, 21 U.S.C. section 360bbb-3(b)(1), unless the authorization is terminated or revoked sooner. Performed at [facility], [place] [address] [place], [place] [address]   MRSA PCR Screening     Status: None   Collection Time: 06/10/19 12:31 PM   Specimen: Nasal Mucosa; Nasopharyngeal  Result Value Ref Range Status   MRSA by PCR NEGATIVE NEGATIVE Final    Comment:        The GeneXpert MRSA Assay (FDA approved for NASAL specimens only), is one component of a comprehensive MRSA colonization surveillance program. It is not intended to diagnose MRSA infection nor to guide or monitor treatment for MRSA infections. Performed at [facility], [place] [address] [place], [name] [address]      Medications:    aspirin EC  81 mg Oral Daily   busPIRone  5 mg Oral Daily   cholecalciferol  1,000 Units Oral Daily   dexamethasone (DECADRON) injection   6 mg Intravenous Q24H   enoxaparin (LOVENOX) injection  50 mg Subcutaneous Q24H   FLUoxetine  10 mg Oral Daily   pantoprazole  40 mg Oral Daily   propranolol ER  160 mg Oral Daily   simvastatin  20 mg Oral QPM   vitamin B-12  500 mcg Oral Daily   vitamin C  500 mg Oral Daily   zinc sulfate  220 mg  Oral Daily   Continuous Infusions:  remdesivir 100 mg in NS 250 mL Stopped (06/11/19 2000)      LOS: 2 days   Charlynne Cousins  [facility]  06/12/2019, 7:46 AM

## 2019-06-13 DIAGNOSIS — J9601 Acute respiratory failure with hypoxia: Secondary | ICD-10-CM | POA: Diagnosis not present

## 2019-06-13 DIAGNOSIS — U071 COVID-19: Secondary | ICD-10-CM | POA: Diagnosis not present

## 2019-06-13 DIAGNOSIS — F419 Anxiety disorder, unspecified: Secondary | ICD-10-CM | POA: Diagnosis not present

## 2019-06-13 DIAGNOSIS — I10 Essential (primary) hypertension: Secondary | ICD-10-CM | POA: Diagnosis not present

## 2019-06-13 LAB — CBC WITH DIFFERENTIAL/PLATELET
Abs Immature Granulocytes: 0.59 10*3/uL — ABNORMAL HIGH (ref 0.00–0.07)
Basophils Absolute: 0.1 10*3/uL (ref 0.0–0.1)
Basophils Relative: 1 %
Eosinophils Absolute: 0 10*3/uL (ref 0.0–0.5)
Eosinophils Relative: 0 %
HCT: 38.9 % (ref 36.0–46.0)
Hemoglobin: 12.2 g/dL (ref 12.0–15.0)
Immature Granulocytes: 8 %
Lymphocytes Relative: 22 %
Lymphs Abs: 1.7 10*3/uL (ref 0.7–4.0)
MCH: 26.3 pg (ref 26.0–34.0)
MCHC: 31.4 g/dL (ref 30.0–36.0)
MCV: 84 fL (ref 80.0–100.0)
Monocytes Absolute: 0.8 10*3/uL (ref 0.1–1.0)
Monocytes Relative: 11 %
Neutro Abs: 4.6 10*3/uL (ref 1.7–7.7)
Neutrophils Relative %: 58 %
Platelets: 383 10*3/uL (ref 150–400)
RBC: 4.63 MIL/uL (ref 3.87–5.11)
RDW: 14.3 % (ref 11.5–15.5)
WBC: 7.9 10*3/uL (ref 4.0–10.5)
nRBC: 0 % (ref 0.0–0.2)

## 2019-06-13 LAB — COMPREHENSIVE METABOLIC PANEL
ALT: 25 U/L (ref 0–44)
AST: 18 U/L (ref 15–41)
Albumin: 3.2 g/dL — ABNORMAL LOW (ref 3.5–5.0)
Alkaline Phosphatase: 39 U/L (ref 38–126)
Anion gap: 8 (ref 5–15)
BUN: 18 mg/dL (ref 6–20)
CO2: 26 mmol/L (ref 22–32)
Calcium: 7.9 mg/dL — ABNORMAL LOW (ref 8.9–10.3)
Chloride: 108 mmol/L (ref 98–111)
Creatinine, Ser: 0.7 mg/dL (ref 0.44–1.00)
GFR calc Af Amer: >60 mL/min (ref >60)
GFR calc non Af Amer: >60 mL/min (ref >60)
Glucose, Bld: 83 mg/dL (ref 70–99)
Potassium: 3.9 mmol/L (ref 3.5–5.1)
Sodium: 142 mmol/L (ref 135–145)
Total Bilirubin: 0.4 mg/dL (ref 0.3–1.2)
Total Protein: 6.3 g/dL — ABNORMAL LOW (ref 6.5–8.1)

## 2019-06-13 LAB — GLUCOSE, CAPILLARY
Glucose-Capillary: 109 mg/dL — ABNORMAL HIGH (ref 70–99)
Glucose-Capillary: 131 mg/dL — ABNORMAL HIGH (ref 70–99)
Glucose-Capillary: 184 mg/dL — ABNORMAL HIGH (ref 70–99)

## 2019-06-13 LAB — D-DIMER, QUANTITATIVE: D-Dimer, Quant: <0.27 ug/mL-FEU (ref 0.00–0.50)

## 2019-06-13 LAB — FERRITIN: Ferritin: 156 ng/mL (ref 11–307)

## 2019-06-13 LAB — C-REACTIVE PROTEIN: CRP: <0.8 mg/dL (ref <1.0)

## 2019-06-13 NOTE — Telephone Encounter (Signed)
I can not fill this out until I see her for her hospital follow up. They are wanting to know what my treatment plan is, which will all depend on how she is doing after her hospitalization.

## 2019-06-13 NOTE — Progress Notes (Signed)
[facility] PROGRESS NOTE    Progress Note  Debbie Walter  MWU:[id number] DOB: 11/24/1980 DOA: 06/10/2019 PCP: [name], NP     Brief Narrative:   Debbie Walter is an 38 y.o. female past medical history significant for hypertension, uterine fibroid, polycystic ovarian syndrome, nephrolithiasis presented to the hospital with chief complaint of fever chills and body aches that started 3 days prior to admission she eventually developed shortness of breath 2 to 3 days prior to admission went to urgent care was set for COVID-19 on 06/09/2019, she went home and started vomiting and throwing up came back to the ED was found to be satting 87% on room air.  COVID-19 medication: 06/09/2019 IV Remdesivir 06/09/2019 IV Decadron  Assessment/Plan:   Acute respiratory failure with hypoxia secondary to Pneumonia due to COVID-19 virus She has been wean to room air currently satting greater 93%. She is to finish her IV Remdesivir and Decadron course of 5 days. Continue vitamin C and zinc. Inflammatory markers are below the cutoff.    Essential  HTN (hypertension) Continue current antihypertensive regimen, blood pressure seems to be well controlled.  GERD: Continue PPI.  Newly diagnosed diabetes mellitus type 2: Hemoglobin A1c 6.5, blood glucose under good control. We will probably have to go on higher dose of metformin home.  It is to note that she is on metformin for polycystic ovarian syndrome  Polycystic ovarian syndrome: Can resume metformin as an outpatient at higher dose.  Anxiety and depression Continue BuSpar and Prozac.  Morbid obesity: She has been counseled.   DVT prophylaxis: lovenox Family Communication:none Disposition Plan/Barrier to D/C: once she is off oxygen and completed her Remdesivir treatment Code Status:     Code Status Orders  (From admission, onward)           Start     Ordered   06/10/19 0534  Full code  Continuous     06/10/19 0537           Code Status History     Date Active Date Inactive Code Status Order ID Comments User Context   05/15/2014 1626 05/17/2014 1527 Full Code [id number]  Cyril Mourning, MD Inpatient   04/28/2014 2027 05/01/2014 1604 Full Code [id number]  Manya Silvas, [place] Inpatient   Advance Care Planning Activity         IV Access:    Peripheral IV   Procedures and diagnostic studies:   No results found.   Medical Consultants:    None.  Anti-Infectives:   none  Subjective:    [name] she relates she feels great when she is in bed or in the chair but when she ambulates like with going to the bathroom she feels slightly dyspneic.  Objective:    Vitals:   06/12/19 1700 06/12/19 1900 06/12/19 2330 06/13/19 0340  BP:  (!) 119/58  (!) 109/54  Pulse: (!) 52 62  (!) 59  Resp:  16  14  Temp:  98.2 F (36.8 C)  98.3 F (36.8 C)  TempSrc:  Oral  Oral  SpO2: 96% 95% 91% 93%  Weight:      Height:       SpO2: 93 % O2 Flow Rate (L/min): 1 L/min   Intake/Output Summary (Last 24 hours) at 06/13/2019 0732 Last data filed at 06/13/2019 0340 Gross per 24 hour  Intake 1330 ml  Output -  Net 1330 ml   Filed Weights   06/10/19 1300  Weight: 104.3 kg  Exam: General exam: In no acute distress. Respiratory system: Good air movement and diffuse crackles bilaterally. Cardiovascular system: S1 & S2 heard, RRR. No JVD. Gastrointestinal system: Abdomen is nondistended, soft and nontender.  Central nervous system: Alert and oriented. No focal neurological deficits. Extremities: No pedal edema. Skin: No rashes, lesions or ulcers Psychiatry: Judgement and insight appear normal. Mood & affect appropriate.   Data Reviewed:    Labs: Basic Metabolic Panel: Recent Labs  Lab 06/10/19 0250 06/11/19 0245 06/12/19 0150 06/13/19 0322  NA 137 142 140 142  K 3.9 4.3 3.9 3.9  CL 103 105 105 108  CO2 21* 25 25 26   GLUCOSE 127* 87 103* 83  BUN 5* 13 19 18   CREATININE  0.76 0.78 0.76 0.70  CALCIUM 8.8* 8.4* 8.1* 7.9*   GFR Estimated Creatinine Clearance: 103.9 mL/min (by C-G formula based on SCr of 0.7 mg/dL). Liver Function Tests: Recent Labs  Lab 06/10/19 0250 06/11/19 0245 06/12/19 0150 06/13/19 0322  AST 28 23 23 18   ALT 24 20 23 25   ALKPHOS 49 45 45 39  BILITOT 0.3 0.3 0.2* 0.4  PROT 6.9 7.2 7.1 6.3*  ALBUMIN 3.6 3.4* 3.4* 3.2*   Recent Labs  Lab 06/10/19 0250  LIPASE 27   No results for input(s): AMMONIA in the last 168 hours. Coagulation profile No results for input(s): INR, PROTIME in the last 168 hours. COVID-19 Labs  Recent Labs    06/11/19 0245 06/12/19 0150 06/13/19 0322  DDIMER 0.27 0.35 <0.27  FERRITIN 164 186 156  CRP  --  1.6* <0.8    Lab Results  Component Value Date   SARSCOV2NAA POSITIVE (A) 06/10/2019    CBC: Recent Labs  Lab 06/10/19 0250 06/11/19 0245 06/12/19 0150 06/13/19 0322  WBC 4.4 5.2 5.5 7.9  NEUTROABS 3.1 3.4 3.6 4.6  HGB 13.3 11.7* 12.2 12.2  HCT 39.9 37.5 39.4 38.9  MCV 81.9 83.9 84.7 84.0  PLT 237 276 349 383   Cardiac Enzymes: No results for input(s): CKTOTAL, CKMB, CKMBINDEX, TROPONINI in the last 168 hours. BNP (last 3 results) No results for input(s): PROBNP in the last 8760 hours. CBG: Recent Labs  Lab 06/11/19 2041 06/12/19 0728 06/12/19 1201 06/12/19 1642 06/12/19 2119  GLUCAP 148* 107* 127* 105* 161*   D-Dimer: Recent Labs    06/12/19 0150 06/13/19 0322  DDIMER 0.35 <0.27   Hgb A1c: No results for input(s): HGBA1C in the last 72 hours. Lipid Profile: No results for input(s): CHOL, HDL, LDLCALC, TRIG, CHOLHDL, LDLDIRECT in the last 72 hours. Thyroid function studies: No results for input(s): TSH, T4TOTAL, T3FREE, THYROIDAB in the last 72 hours.  Invalid input(s): FREET3 Anemia work up: Recent Labs    06/12/19 0150 06/13/19 0322  FERRITIN 186 156   Sepsis Labs: Recent Labs  Lab 06/10/19 0250 06/11/19 0245 06/12/19 0150 06/13/19 0322  WBC 4.4  5.2 5.5 7.9   Microbiology Recent Results (from the past 240 hour(s))  SARS Coronavirus 2 [facility] order, Performed in [facility] hospital lab) Nasopharyngeal Nasopharyngeal Swab     Status: Abnormal   Collection Time: 06/10/19  2:45 AM   Specimen: Nasopharyngeal Swab  Result Value Ref Range Status   SARS Coronavirus 2 POSITIVE (A) NEGATIVE Final    Comment: RESULT CALLED TO, READ BACK BY AND VERIFIED WITH: RN DAVIS RAMON AT 0404 ON 06/10/2019 BY MESSAN HOUEGNIFIO (NOTE) If result is NEGATIVE SARS-CoV-2 target nucleic acids are NOT DETECTED. The SARS-CoV-2 RNA is generally detectable in upper and  lower  respiratory specimens during the acute phase of infection. The lowest  concentration of SARS-CoV-2 viral copies this assay can detect is 250  copies / mL. A negative result does not preclude SARS-CoV-2 infection  and should not be used as the sole basis for treatment or other  patient management decisions.  A negative result may occur with  improper specimen collection / handling, submission of specimen other  than nasopharyngeal swab, presence of viral mutation(s) within the  areas targeted by this assay, and inadequate number of viral copies  (<250 copies / mL). A negative result must be combined with clinical  observations, patient history, and epidemiological information. If result is POSITIVE SARS-CoV-2 target nucleic a cids are DETECTED. The SARS-CoV-2 RNA is generally detectable in upper and lower  respiratory specimens during the acute phase of infection.  Positive  results are indicative of active infection with SARS-CoV-2.  Clinical  correlation with patient history and other diagnostic information is  necessary to determine patient infection status.  Positive results do  not rule out bacterial infection or co-infection with other viruses. If result is PRESUMPTIVE POSTIVE SARS-CoV-2 nucleic acids MAY BE PRESENT.   A presumptive positive result was obtained on the  submitted specimen  and confirmed on repeat testing.  While 2019 novel coronavirus  (SARS-CoV-2) nucleic acids may be present in the submitted sample  additional confirmatory testing may be necessary for epidemiological  and / or clinical management purposes  to differentiate between  SARS-CoV-2 and other Sarbecovirus currently known to infect humans.  If clinically indicated additional testing with an alternate test  met hodology [phone]) is advised. The SARS-CoV-2 RNA is generally  detectable in upper and lower respiratory specimens during the acute  phase of infection. The expected result is Negative. Fact Sheet for Patients:  StrictlyIdeas.no Fact Sheet for Healthcare Providers: BankingDealers.co.za This test is not yet approved or cleared by the [place] FDA and has been authorized for detection and/or diagnosis of SARS-CoV-2 by FDA under an Emergency Use Authorization (EUA).  This EUA will remain in effect (meaning this test can be used) for the duration of the COVID-19 declaration under Section 564(b)(1) of the Act, 21 U.S.C. section 360bbb-3(b)(1), unless the authorization is terminated or revoked sooner. Performed at [facility], [place] [address], [place], [place] [address]   MRSA PCR Screening     Status: None   Collection Time: 06/10/19 12:31 PM   Specimen: Nasal Mucosa; Nasopharyngeal  Result Value Ref Range Status   MRSA by PCR NEGATIVE NEGATIVE Final    Comment:        The GeneXpert MRSA Assay (FDA approved for NASAL specimens only), is one component of a comprehensive MRSA colonization surveillance program. It is not intended to diagnose MRSA infection nor to guide or monitor treatment for MRSA infections. Performed at [facility], [place] [address] [place], [place] [address]      Medications:   . aspirin EC  81 mg Oral Daily  . busPIRone  5 mg Oral Daily  . cholecalciferol   1,000 Units Oral Daily  . dexamethasone (DECADRON) injection  6 mg Intravenous Q24H  . enoxaparin (LOVENOX) injection  50 mg Subcutaneous Q24H  . FLUoxetine  10 mg Oral Daily  . pantoprazole  40 mg Oral Daily  . propranolol ER  160 mg Oral Daily  . simvastatin  20 mg Oral QPM  . vitamin B-12  500 mcg Oral Daily  . vitamin C  500 mg  Oral Daily  . zinc sulfate  220 mg Oral Daily   Continuous Infusions: . remdesivir 100 mg in NS 250 mL Stopped (06/12/19 2000)      LOS: 3 days   Charlynne Cousins  [facility]  06/13/2019, 7:32 AM

## 2019-06-14 DIAGNOSIS — U071 COVID-19: Secondary | ICD-10-CM | POA: Diagnosis not present

## 2019-06-14 DIAGNOSIS — F419 Anxiety disorder, unspecified: Secondary | ICD-10-CM | POA: Diagnosis not present

## 2019-06-14 DIAGNOSIS — J9601 Acute respiratory failure with hypoxia: Secondary | ICD-10-CM | POA: Diagnosis not present

## 2019-06-14 DIAGNOSIS — I10 Essential (primary) hypertension: Secondary | ICD-10-CM | POA: Diagnosis not present

## 2019-06-14 LAB — COMPREHENSIVE METABOLIC PANEL
ALT: 39 U/L (ref 0–44)
AST: 25 U/L (ref 15–41)
Albumin: 3.2 g/dL — ABNORMAL LOW (ref 3.5–5.0)
Alkaline Phosphatase: 43 U/L (ref 38–126)
Anion gap: 10 (ref 5–15)
BUN: 18 mg/dL (ref 6–20)
CO2: 23 mmol/L (ref 22–32)
Calcium: 8.3 mg/dL — ABNORMAL LOW (ref 8.9–10.3)
Chloride: 104 mmol/L (ref 98–111)
Creatinine, Ser: 0.85 mg/dL (ref 0.44–1.00)
GFR calc Af Amer: >60 mL/min (ref >60)
GFR calc non Af Amer: >60 mL/min (ref >60)
Glucose, Bld: 101 mg/dL — ABNORMAL HIGH (ref 70–99)
Potassium: 4 mmol/L (ref 3.5–5.1)
Sodium: 137 mmol/L (ref 135–145)
Total Bilirubin: 0.3 mg/dL (ref 0.3–1.2)
Total Protein: 6.1 g/dL — ABNORMAL LOW (ref 6.5–8.1)

## 2019-06-14 LAB — GLUCOSE, CAPILLARY
Glucose-Capillary: 96 mg/dL (ref 70–99)
Glucose-Capillary: 112 mg/dL — ABNORMAL HIGH (ref 70–99)

## 2019-06-14 MED ORDER — METFORMIN HCL 500 MG PO TABS
1000.0000 mg | ORAL_TABLET | Freq: Two times a day (BID) | ORAL | Status: DC
  Administered 2019-06-14 – 2019-06-15 (×3): 1000 mg via ORAL
  Filled 2019-06-14 (×3): qty 2

## 2019-06-14 MED ORDER — POLYETHYLENE GLYCOL 3350 17 G PO PACK
17.0000 g | PACK | Freq: Every day | ORAL | Status: DC
  Administered 2019-06-14: 17 g via ORAL
  Filled 2019-06-14 (×2): qty 1

## 2019-06-14 NOTE — Progress Notes (Signed)
[facility] PROGRESS NOTE    Progress Note  ROSHAWNDA Walter  ZOX:[id number] DOB: [birth year] DOA: 06/10/2019 PCP: [name], NP     Brief Narrative:   Debbie Walter is an 38 y.o. female past medical history significant for hypertension, uterine fibroid, polycystic ovarian syndrome, nephrolithiasis presented to the hospital with chief complaint of fever chills and body aches that started 3 days prior to admission she eventually developed shortness of breath 2 to 3 days prior to admission went to urgent care was set for COVID-19 on 06/09/2019, she went home and started vomiting and throwing up came back to the ED was found to be satting 87% on room air.  COVID-19 medication: 06/09/2019 IV Remdesivir 06/09/2019 IV Decadron  Assessment/Plan:   Acute respiratory failure with hypoxia secondary to Pneumonia due to COVID-19 virus She continues to be satting greater than 90% on room air. She is to complete course of IV Remdesivir, currently on Decadron Continue vitamin C and zinc. Inflammatory markers have decreased to below the cutoff level. Ambulating check saturations with ambulation.  Essential  HTN (hypertension) Continue current antihypertensive regimen, blood pressure seems to be well controlled.  GERD: Continue PPI.  Newly diagnosed diabetes mellitus type 2: Hemoglobin A1c 6.5, blood glucose under good control. We will restart her metformin at higher dose.  Polycystic ovarian syndrome: Restarted metformin.  Anxiety and depression Continue BuSpar and Prozac.  Morbid obesity: She has been counseled.   DVT prophylaxis: lovenox Family Communication:none Disposition Plan/Barrier to D/C: Hopefully home in the morning. Code Status:     Code Status Orders  (From admission, onward)           Start     Ordered   06/10/19 0534  Full code  Continuous     06/10/19 0537           Code Status History     Date Active Date Inactive Code Status Order ID  Comments User Context   05/15/2014 1626 05/17/2014 1527 Full Code [id number]  Cyril Mourning, MD Inpatient   04/28/2014 2027 05/01/2014 1604 Full Code [id number]  Manya Silvas, CNM Inpatient   Advance Care Planning Activity         IV Access:    Peripheral IV   Procedures and diagnostic studies:   No results found.   Medical Consultants:    None.  Anti-Infectives:   none  Subjective:    Debbie Walter she relates she continues to improve on a daily basis.  Objective:    Vitals:   06/13/19 0840 06/13/19 1500 06/13/19 1940 06/14/19 0447  BP: 135/71 (!) 147/84 127/67 (!) 134/56  Pulse:    (!) 58  Resp: 19 18 18 14   Temp: 98.2 F (36.8 C) 98.6 F (37 C) 97.9 F (36.6 C) 98.1 F (36.7 C)  TempSrc: Oral Oral Oral Oral  SpO2: 94% 90% 96% 94%  Weight:      Height:       SpO2: 94 % O2 Flow Rate (L/min): 1 L/min   Intake/Output Summary (Last 24 hours) at 06/14/2019 5784 Last data filed at 06/13/2019 1839 Gross per 24 hour  Intake 250 ml  Output -  Net 250 ml   Filed Weights   06/10/19 1300  Weight: 104.3 kg    Exam: General exam: In no acute distress, morbidly obese appears tired. Respiratory system: Good air movement and diffuse crackles bilaterally. Cardiovascular system: S1 & S2 heard, RRR. No JVD. Gastrointestinal system: Abdomen is nondistended, soft  and nontender.  Central nervous system: Alert and oriented. No focal neurological deficits. Extremities: No pedal edema. Skin: No rashes, lesions or ulcers Psychiatry: Judgement and insight appear normal. Mood & affect appropriate.   Data Reviewed:    Labs: Basic Metabolic Panel: Recent Labs  Lab 06/10/19 0250 06/11/19 0245 06/12/19 0150 06/13/19 0322  NA 137 142 140 142  K 3.9 4.3 3.9 3.9  CL 103 105 105 108  CO2 21* 25 25 26   GLUCOSE 127* 87 103* 83  BUN 5* 13 19 18   CREATININE 0.76 0.78 0.76 0.70  CALCIUM 8.8* 8.4* 8.1* 7.9*   GFR Estimated Creatinine Clearance: 103.9 mL/min  (by C-G formula based on SCr of 0.7 mg/dL). Liver Function Tests: Recent Labs  Lab 06/10/19 0250 06/11/19 0245 06/12/19 0150 06/13/19 0322  AST 28 23 23 18   ALT 24 20 23 25   ALKPHOS 49 45 45 39  BILITOT 0.3 0.3 0.2* 0.4  PROT 6.9 7.2 7.1 6.3*  ALBUMIN 3.6 3.4* 3.4* 3.2*   Recent Labs  Lab 06/10/19 0250  LIPASE 27   No results for input(s): AMMONIA in the last 168 hours. Coagulation profile No results for input(s): INR, PROTIME in the last 168 hours. COVID-19 Labs  Recent Labs    06/12/19 0150 06/13/19 0322  DDIMER 0.35 <0.27  FERRITIN 186 156  CRP 1.6* <0.8    Lab Results  Component Value Date   SARSCOV2NAA POSITIVE (A) 06/10/2019    CBC: Recent Labs  Lab 06/10/19 0250 06/11/19 0245 06/12/19 0150 06/13/19 0322  WBC 4.4 5.2 5.5 7.9  NEUTROABS 3.1 3.4 3.6 4.6  HGB 13.3 11.7* 12.2 12.2  HCT 39.9 37.5 39.4 38.9  MCV 81.9 83.9 84.7 84.0  PLT 237 276 349 383   Cardiac Enzymes: No results for input(s): CKTOTAL, CKMB, CKMBINDEX, TROPONINI in the last 168 hours. BNP (last 3 results) No results for input(s): PROBNP in the last 8760 hours. CBG: Recent Labs  Lab 06/12/19 1642 06/12/19 2119 06/13/19 0807 06/13/19 1203 06/13/19 2035  GLUCAP 105* 161* 109* 131* 184*   D-Dimer: Recent Labs    06/12/19 0150 06/13/19 0322  DDIMER 0.35 <0.27   Hgb A1c: No results for input(s): HGBA1C in the last 72 hours. Lipid Profile: No results for input(s): CHOL, HDL, LDLCALC, TRIG, CHOLHDL, LDLDIRECT in the last 72 hours. Thyroid function studies: No results for input(s): TSH, T4TOTAL, T3FREE, THYROIDAB in the last 72 hours.  Invalid input(s): FREET3 Anemia work up: Recent Labs    06/12/19 0150 06/13/19 0322  FERRITIN 186 156   Sepsis Labs: Recent Labs  Lab 06/10/19 0250 06/11/19 0245 06/12/19 0150 06/13/19 0322  WBC 4.4 5.2 5.5 7.9   Microbiology Recent Results (from the past 240 hour(s))  SARS Coronavirus 2 [facility] order, Performed in [facility] hospital lab) Nasopharyngeal Nasopharyngeal Swab     Status: Abnormal   Collection Time: 06/10/19  2:45 AM   Specimen: Nasopharyngeal Swab  Result Value Ref Range Status   SARS Coronavirus 2 POSITIVE (A) NEGATIVE Final    Comment: RESULT CALLED TO, READ BACK BY AND VERIFIED WITH: RN DAVIS RAMON AT 0404 ON 06/10/2019 BY MESSAN HOUEGNIFIO (NOTE) If result is NEGATIVE SARS-CoV-2 target nucleic acids are NOT DETECTED. The SARS-CoV-2 RNA is generally detectable in upper and lower  respiratory specimens during the acute phase of infection. The lowest  concentration of SARS-CoV-2 viral copies this assay can detect is 250  copies / mL. A negative result does not preclude SARS-CoV-2 infection  and  should not be used as the sole basis for treatment or other  patient management decisions.  A negative result may occur with  improper specimen collection / handling, submission of specimen other  than nasopharyngeal swab, presence of viral mutation(s) within the  areas targeted by this assay, and inadequate number of viral copies  (<250 copies / mL). A negative result must be combined with clinical  observations, patient history, and epidemiological information. If result is POSITIVE SARS-CoV-2 target nucleic a cids are DETECTED. The SARS-CoV-2 RNA is generally detectable in upper and lower  respiratory specimens during the acute phase of infection.  Positive  results are indicative of active infection with SARS-CoV-2.  Clinical  correlation with patient history and other diagnostic information is  necessary to determine patient infection status.  Positive results do  not rule out bacterial infection or co-infection with other viruses. If result is PRESUMPTIVE POSTIVE SARS-CoV-2 nucleic acids MAY BE PRESENT.   A presumptive positive result was obtained on the submitted specimen  and confirmed on repeat testing.  While 2019 novel coronavirus  (SARS-CoV-2) nucleic acids may be present in the  submitted sample  additional confirmatory testing may be necessary for epidemiological  and / or clinical management purposes  to differentiate between  SARS-CoV-2 and other Sarbecovirus currently known to infect humans.  If clinically indicated additional testing with an alternate test  met hodology [phone]) is advised. The SARS-CoV-2 RNA is generally  detectable in upper and lower respiratory specimens during the acute  phase of infection. The expected result is Negative. Fact Sheet for Patients:  StrictlyIdeas.no Fact Sheet for Healthcare Providers: BankingDealers.co.za This test is not yet approved or cleared by the [place] FDA and has been authorized for detection and/or diagnosis of SARS-CoV-2 by FDA under an Emergency Use Authorization (EUA).  This EUA will remain in effect (meaning this test can be used) for the duration of the COVID-19 declaration under Section 564(b)(1) of the Act, 21 U.S.C. section 360bbb-3(b)(1), unless the authorization is terminated or revoked sooner. Performed at [facility], [place] [address] [place], [place] [address]   MRSA PCR Screening     Status: None   Collection Time: 06/10/19 12:31 PM   Specimen: Nasal Mucosa; Nasopharyngeal  Result Value Ref Range Status   MRSA by PCR NEGATIVE NEGATIVE Final    Comment:        The GeneXpert MRSA Assay (FDA approved for NASAL specimens only), is one component of a comprehensive MRSA colonization surveillance program. It is not intended to diagnose MRSA infection nor to guide or monitor treatment for MRSA infections. Performed at [facility], [place] [address] [place], [place] [address]      Medications:   . aspirin EC  81 mg Oral Daily  . busPIRone  5 mg Oral Daily  . cholecalciferol  1,000 Units Oral Daily  . dexamethasone (DECADRON) injection  6 mg Intravenous Q24H  . enoxaparin (LOVENOX) injection  50 mg Subcutaneous  Q24H  . FLUoxetine  10 mg Oral Daily  . pantoprazole  40 mg Oral Daily  . propranolol ER  160 mg Oral Daily  . simvastatin  20 mg Oral QPM  . vitamin B-12  500 mcg Oral Daily  . vitamin C  500 mg Oral Daily  . zinc sulfate  220 mg Oral Daily   Continuous Infusions: . remdesivir 100 mg in NS 250 mL 100 mg (06/13/19 1553)      LOS: 4 days   Tammi Klippel  Aileen Fass  [facility]  06/14/2019, 8:08 AM

## 2019-06-14 NOTE — Progress Notes (Signed)
Late entry for 09.25.20 @ 1944. Patient seen and assessed. Physical assessment completed via computerized charting per [facility] policy. Patient resting in bed. No acute distress noted. Pt uses incentive spirometer-1500 ml inspired air noted. Patient tolerated well. Encouraged use of Incentive Spirometer 10x in an hour. Verbalized understanding.

## 2019-06-14 NOTE — Discharge Planning (Signed)
Patient ambulated in hall 1x around floor.  Remained in mid 90s SPO2 the entire walk.  For today, patient will not need O2 sent home if discharged. Staff will re-assess as needed.

## 2019-06-14 NOTE — Progress Notes (Addendum)
Late night nursing rounds completed. Patient seen ambulating out of restroom. Staff nurse makes inquiry related to calling family on patient behalf to update condition. Pt declines at this time.   06/14/19 @ 0456: On call provider notified r/t pt concern about no labs being obtained this AM (pt has dose of IV Remdesivir this AM)

## 2019-06-14 NOTE — Plan of Care (Signed)
Offered to update husband, but patient stated she has been talking to him all day.

## 2019-06-15 ENCOUNTER — Other Ambulatory Visit: Payer: Self-pay | Admitting: Internal Medicine

## 2019-06-15 ENCOUNTER — Encounter (HOSPITAL_COMMUNITY): Payer: Self-pay

## 2019-06-15 DIAGNOSIS — U071 COVID-19: Secondary | ICD-10-CM | POA: Diagnosis not present

## 2019-06-15 DIAGNOSIS — J9601 Acute respiratory failure with hypoxia: Secondary | ICD-10-CM | POA: Diagnosis not present

## 2019-06-15 DIAGNOSIS — I10 Essential (primary) hypertension: Secondary | ICD-10-CM | POA: Diagnosis not present

## 2019-06-15 LAB — D-DIMER, QUANTITATIVE: D-Dimer, Quant: <0.27 ug/mL-FEU (ref 0.00–0.50)

## 2019-06-15 LAB — GLUCOSE, CAPILLARY
Glucose-Capillary: 114 mg/dL — ABNORMAL HIGH (ref 70–99)
Glucose-Capillary: 91 mg/dL (ref 70–99)

## 2019-06-15 MED ORDER — METFORMIN HCL 500 MG PO TABS: 1000.0000 mg | ORAL_TABLET | Freq: Two times a day (BID) | ORAL | 3 refills | Status: DC

## 2019-06-15 MED ORDER — ONDANSETRON 4 MG PO TBDP: 4.0000 mg | ORAL_TABLET | Freq: Three times a day (TID) | ORAL | 0 refills | Status: DC | PRN

## 2019-06-15 NOTE — TOC Transition Note (Signed)
Transition of Care [facility]) - CM/SW Discharge Note   Patient Details  Name: Debbie Walter MRN: [id number] Date of Birth: 11/18/1980  [facility]) CM/SW Contact:  Ninfa Meeker, RN Phone Number: [phone] (working remotely) 06/15/2019, 8:31 AM   Clinical Narrative:   38 yr old female admitted and treated for COVID 19, thankfully she is improving and will discharge home. No needs identified.            Patient Goals and CMS Choice        Discharge Placement                       Discharge Plan and Services                                     Social Determinants of Health (SDOH) Interventions     Readmission Risk Interventions No flowsheet data found.

## 2019-06-15 NOTE — Progress Notes (Signed)
Spoke with patients husband and gave him a clinical update of patient's current inpatient status. We discussed o2sats, vitals and possible d/c plan. He is agreeable with care and denied any concerns. He is encouraged to call should he have questions or concerns

## 2019-06-15 NOTE — Discharge Instructions (Signed)
Debbie Walter was admitted to the Hospital on 06/10/2019 and Discharged on Discharge Date 06/15/2019 and should be excused from work/school   for 12 days starting 06/10/2019 , may return to work/school without any restrictions.  Call Bess Harvest MD, [facility] [phone] with questions.  Charlynne Cousins M.D on 06/15/2019,at 10:48 AM  [facility] Office  [phone]

## 2019-06-15 NOTE — Discharge Planning (Signed)
IV removed.  RN assessment and VS revealed stability for DC to home (No O2 needs).  Discussed self home care and need for additional time to heal.  Talked about self quarantining for additional 2 weeks beyond DC.  Continue self monitoring and everyone else in the home - contact PCP if s/sx of difficulty breathing or uncontrolled temps.  Signed/agreed to DC contract and placed on chart.  Script printed and given, zofran script sent to pharm, per patient request. Husband coming to  [facility] and transport home via car.

## 2019-06-15 NOTE — Discharge Summary (Signed)
Physician Discharge Summary  Debbie Walter GMW:[id number] DOB: March 15, 1981 DOA: 06/10/2019  PCP: [name], NP  Admit date: 06/10/2019 Discharge date: 06/15/2019  Admitted From: Home Disposition:  Home  Recommendations for Outpatient Follow-up:  1. Follow up with PCP in 1-2 weeks 2. Please obtain BMP/CBC in one week 3.   Home Health:No Equipment/Devices:None  Discharge Condition:Stable CODE STATUS:Full Diet recommendation: Heart Healthy / Carb Modified / Regular / Dysphagia   Brief/Interim Summary: 38 y.o. female past medical history significant for hypertension, uterine fibroid, polycystic ovarian syndrome, nephrolithiasis presented to the hospital with chief complaint of fever chills and body aches that started 3 days prior to admission she eventually developed shortness of breath 2 to 3 days prior to admission went to urgent care was set for COVID-19 on 06/09/2019, she went home and started vomiting and throwing up came back to the ED was found to be satting 87% on room air  Discharge Diagnoses:  Principal Problem:   Pneumonia due to COVID-19 virus Active Problems:   HTN (hypertension)   Anxiety and depression   GERD (gastroesophageal reflux disease)   Acute respiratory failure with hypoxia (HCC)   Obesity, Class III, BMI 40-49.9 (morbid obesity) ([place]) Acute respiratory failure with hypoxia secondary to COVID-19 viral pneumonia: She was started on IV steroids and inhalers she had to be placed on oxygen 2 to 4 L. She was also started on vitamin C and zinc. After few days her inflammatory markers started to improve, she was ambulated and saturations were checked on the day of discharge and saturations remained greater than 90%. She finished her course of IV Remdesivir and steroids in house.  Essential hypertension: No changes made to her medication.  Newly diagnosed diabetes mellitus type 2: An A1c of 6.5 her blood glucose remained well controlled in house at home she is  on metformin for her polycystic kidney disease. Her metformin was increased to thousand milligrams twice a day which she will continue as an outpatient.  Polycystic ovarian syndrome: Continue metformin.  Anxiety and depression: Continue BuSpar and Prozac.   Discharge Instructions  Discharge Instructions    Diet - low sodium heart healthy   Complete by: As directed    Increase activity slowly   Complete by: As directed    MyChart COVID-19 home monitoring program   Complete by: Jun 15, 2019    Is the patient willing to use the [place] for home monitoring?: Yes   Temperature monitoring   Complete by: Jun 15, 2019    After how many days would you like to receive a notification of this patient's flowsheet entries?: 1     Allergies as of 06/15/2019      Reactions   Procardia [nifedipine] Other (See Comments)   headache   Wellbutrin [bupropion] Other (See Comments)   "Makes my blood pressure go up - I don't ever want to take it again"   Labetalol Other (See Comments)   "makes me feel like things are crawling through my head"   Hydromorphone Other (See Comments)   Makes pt feel out of this world. Patient states it needs to be pushed slow, but she can take it.   Morphine And Related    Needs to be pushed slow or pt feels like she will code   Sudafed [pseudoephedrine Hcl]    Heart race / dries out mucus membranes   Zithromax [azithromycin] Other (See Comments)   Gi upset.  Diarrhea      Medication  List    STOP taking these medications   amoxicillin-clavulanate 875-125 MG tablet Commonly known as: AUGMENTIN     TAKE these medications   aspirin EC 81 MG tablet Take 81 mg by mouth daily.   busPIRone 5 MG tablet Commonly known as: BUSPAR TAKE 1 TABLET BY MOUTH EVERY DAY   cholecalciferol 1000 units tablet Commonly known as: VITAMIN D Take 1,000 Units by mouth daily.   Fish Oil 1000 MG Caps Take 1 capsule by mouth daily.   FLUoxetine 10 MG tablet Commonly  known as: PROZAC TAKE 1 TABLET BY MOUTH EVERY DAY   metFORMIN 500 MG tablet Commonly known as: GLUCOPHAGE Take 2 tablets (1,000 mg total) by mouth 2 (two) times daily with a meal. What changed: how much to take   nitroGLYCERIN 0.4 MG SL tablet Commonly known as: NITROSTAT Place 0.4 mg under the tongue every 5 (five) minutes as needed for chest pain.   ondansetron 4 MG disintegrating tablet Commonly known as: Zofran ODT Take 1 tablet (4 mg total) by mouth every 8 (eight) hours as needed for nausea or vomiting.   pantoprazole 40 MG tablet Commonly known as: PROTONIX TAKE 1 TABLET BY MOUTH EVERY DAY   propranolol ER 160 MG SR capsule Commonly known as: INDERAL LA Take 1 capsule (160 mg total) by mouth daily.   simvastatin 40 MG tablet Commonly known as: ZOCOR Take 20 mg by mouth every evening.   spironolactone 50 MG tablet Commonly known as: Aldactone Take 1 tablet (50 mg total) by mouth daily.   valsartan 320 MG tablet Commonly known as: DIOVAN TAKE 1 TABLET BY MOUTH EVERY DAY   vitamin B-12 500 MCG tablet Commonly known as: CYANOCOBALAMIN Take 1 tablet (500 mcg total) by mouth daily.       Allergies  Allergen Reactions  . Procardia [Nifedipine] Other (See Comments)    headache  . Wellbutrin [Bupropion] Other (See Comments)    "Makes my blood pressure go up - I don't ever want to take it again"  . Labetalol Other (See Comments)    "makes me feel like things are crawling through my head"  . Hydromorphone Other (See Comments)    Makes pt feel out of this world. Patient states it needs to be pushed slow, but she can take it.  . Morphine And Related     Needs to be pushed slow or pt feels like she will code  . Sudafed [Pseudoephedrine Hcl]     Heart race / dries out mucus membranes  . Zithromax [Azithromycin] Other (See Comments)    Gi upset.  Diarrhea    Consultations:  None   Procedures/Studies: Dg Chest Portable 1 View  Result Date:  06/10/2019 CLINICAL DATA:  Fever and shortness of breath. EXAM: PORTABLE CHEST 1 VIEW COMPARISON:  Radiograph 10/19/2018 FINDINGS: Heterogeneous opacities in the mid lower lung zone predominant distribution, right greater than left. Normal mediastinal contours with upper normal heart size. No pulmonary edema, large pleural effusion or pneumothorax. Surgical clips at the thoracic inlet. IMPRESSION: Heterogeneous mid and lower lung zone predominant opacities, suspicious for multifocal pneumonia, including COVID-19. Electronically Signed   By: Keith Rake M.D.   On: 06/10/2019 03:20   Subjective: No complains feels great  Discharge Exam: Vitals:   06/15/19 0420 06/15/19 0759  BP: 114/66 129/74  Pulse:  61  Resp: 16 18  Temp: 99 F (37.2 C) 98.1 F (36.7 C)  SpO2:  97%   Vitals:   06/14/19 1342 06/14/19  2015 06/15/19 0420 06/15/19 0759  BP:  (!) 111/58 114/66 129/74  Pulse:    61  Resp:  '16 16 18  '$ Temp:  98.5 F (36.9 C) 99 F (37.2 C) 98.1 F (36.7 C)  TempSrc:  Oral Oral Oral  SpO2: 95% 96%  97%  Weight:      Height:        General: Pt is alert, awake, not in acute distress Cardiovascular: RRR, S1/S2 +, no rubs, no gallops Respiratory: CTA bilaterally, no wheezing, no rhonchi Abdominal: Soft, NT, ND, bowel sounds + Extremities: no edema, no cyanosis    The results of significant diagnostics from this hospitalization (including imaging, microbiology, ancillary and laboratory) are listed below for reference.     Microbiology: Recent Results (from the past 240 hour(s))  SARS Coronavirus 2 [facility] order, Performed in [facility] hospital lab) Nasopharyngeal Nasopharyngeal Swab     Status: Abnormal   Collection Time: 06/10/19  2:45 AM   Specimen: Nasopharyngeal Swab  Result Value Ref Range Status   SARS Coronavirus 2 POSITIVE (A) NEGATIVE Final    Comment: RESULT CALLED TO, READ BACK BY AND VERIFIED WITH: RN DAVIS RAMON AT 0404 ON 06/10/2019 BY MESSAN  HOUEGNIFIO (NOTE) If result is NEGATIVE SARS-CoV-2 target nucleic acids are NOT DETECTED. The SARS-CoV-2 RNA is generally detectable in upper and lower  respiratory specimens during the acute phase of infection. The lowest  concentration of SARS-CoV-2 viral copies this assay can detect is 250  copies / mL. A negative result does not preclude SARS-CoV-2 infection  and should not be used as the sole basis for treatment or other  patient management decisions.  A negative result may occur with  improper specimen collection / handling, submission of specimen other  than nasopharyngeal swab, presence of viral mutation(s) within the  areas targeted by this assay, and inadequate number of viral copies  (<250 copies / mL). A negative result must be combined with clinical  observations, patient history, and epidemiological information. If result is POSITIVE SARS-CoV-2 target nucleic a cids are DETECTED. The SARS-CoV-2 RNA is generally detectable in upper and lower  respiratory specimens during the acute phase of infection.  Positive  results are indicative of active infection with SARS-CoV-2.  Clinical  correlation with patient history and other diagnostic information is  necessary to determine patient infection status.  Positive results do  not rule out bacterial infection or co-infection with other viruses. If result is PRESUMPTIVE POSTIVE SARS-CoV-2 nucleic acids MAY BE PRESENT.   A presumptive positive result was obtained on the submitted specimen  and confirmed on repeat testing.  While 2019 novel coronavirus  (SARS-CoV-2) nucleic acids may be present in the submitted sample  additional confirmatory testing may be necessary for epidemiological  and / or clinical management purposes  to differentiate between  SARS-CoV-2 and other Sarbecovirus currently known to infect humans.  If clinically indicated additional testing with an alternate test  met hodology [phone]) is advised. The  SARS-CoV-2 RNA is generally  detectable in upper and lower respiratory specimens during the acute  phase of infection. The expected result is Negative. Fact Sheet for Patients:  StrictlyIdeas.no Fact Sheet for Healthcare Providers: BankingDealers.co.za This test is not yet approved or cleared by the [place] FDA and has been authorized for detection and/or diagnosis of SARS-CoV-2 by FDA under an Emergency Use Authorization (EUA).  This EUA will remain in effect (meaning this test can be used) for the duration of the COVID-19 declaration under Section  564(b)(1) of the Act, 21 U.S.C. section 360bbb-3(b)(1), unless the authorization is terminated or revoked sooner. Performed at [facility], [place] [address], [place], [place] [address]   MRSA PCR Screening     Status: None   Collection Time: 06/10/19 12:31 PM   Specimen: Nasal Mucosa; Nasopharyngeal  Result Value Ref Range Status   MRSA by PCR NEGATIVE NEGATIVE Final    Comment:        The GeneXpert MRSA Assay (FDA approved for NASAL specimens only), is one component of a comprehensive MRSA colonization surveillance program. It is not intended to diagnose MRSA infection nor to guide or monitor treatment for MRSA infections. Performed at [facility], [place] [address], [place], [place] [address]      Labs: BNP (last 3 results) No results for input(s): BNP in the last 8760 hours. Basic Metabolic Panel: Recent Labs  Lab 06/10/19 0250 06/11/19 0245 06/12/19 0150 06/13/19 0322 06/14/19 1610  NA 137 142 140 142 137  K 3.9 4.3 3.9 3.9 4.0  CL 103 105 105 108 104  CO2 21* '25 25 26 23  '$ GLUCOSE 127* 87 103* 83 101*  BUN 5* '13 19 18 18  '$ CREATININE 0.76 0.78 0.76 0.70 0.85  CALCIUM 8.8* 8.4* 8.1* 7.9* 8.3*   Liver Function Tests: Recent Labs  Lab 06/10/19 0250 06/11/19 0245 06/12/19 0150 06/13/19 0322 06/14/19 1610  AST '28 23 23 18 25  '$ ALT '24 20  23 25 '$ 39  ALKPHOS 49 45 45 39 43  BILITOT 0.3 0.3 0.2* 0.4 0.3  PROT 6.9 7.2 7.1 6.3* 6.1*  ALBUMIN 3.6 3.4* 3.4* 3.2* 3.2*   Recent Labs  Lab 06/10/19 0250  LIPASE 27   No results for input(s): AMMONIA in the last 168 hours. CBC: Recent Labs  Lab 06/10/19 0250 06/11/19 0245 06/12/19 0150 06/13/19 0322  WBC 4.4 5.2 5.5 7.9  NEUTROABS 3.1 3.4 3.6 4.6  HGB 13.3 11.7* 12.2 12.2  HCT 39.9 37.5 39.4 38.9  MCV 81.9 83.9 84.7 84.0  PLT 237 276 349 383   Cardiac Enzymes: No results for input(s): CKTOTAL, CKMB, CKMBINDEX, TROPONINI in the last 168 hours. BNP: Invalid input(s): POCBNP CBG: Recent Labs  Lab 06/13/19 2035 06/14/19 0733 06/14/19 1054 06/14/19 2215 06/15/19 0800  GLUCAP 184* 96 114* 112* 91   D-Dimer Recent Labs    06/13/19 0322 06/15/19 0235  DDIMER <0.27 <0.27   Hgb A1c No results for input(s): HGBA1C in the last 72 hours. Lipid Profile No results for input(s): CHOL, HDL, LDLCALC, TRIG, CHOLHDL, LDLDIRECT in the last 72 hours. Thyroid function studies No results for input(s): TSH, T4TOTAL, T3FREE, THYROIDAB in the last 72 hours.  Invalid input(s): FREET3 Anemia work up Recent Labs    06/13/19 0322  FERRITIN 156   Urinalysis    Component Value Date/Time   COLORURINE YELLOW 06/10/2019 [place] 06/10/2019 0250   LABSPEC 1.006 06/10/2019 0250   PHURINE 7.0 06/10/2019 0250   GLUCOSEU NEGATIVE 06/10/2019 0250   HGBUR MODERATE (A) 06/10/2019 0250   BILIRUBINUR NEGATIVE 06/10/2019 0250   KETONESUR NEGATIVE 06/10/2019 0250   PROTEINUR 100 (A) 06/10/2019 0250   UROBILINOGEN 0.2 04/28/2014 1620   NITRITE NEGATIVE 06/10/2019 0250   LEUKOCYTESUR NEGATIVE 06/10/2019 0250   Sepsis Labs Invalid input(s): PROCALCITONIN,  WBC,  LACTICIDVEN Microbiology Recent Results (from the past 240 hour(s))  SARS Coronavirus 2 [facility] order, Performed in [facility] hospital lab) Nasopharyngeal Nasopharyngeal Swab     Status:  Abnormal    Collection Time: 06/10/19  2:45 AM   Specimen: Nasopharyngeal Swab  Result Value Ref Range Status   SARS Coronavirus 2 POSITIVE (A) NEGATIVE Final    Comment: RESULT CALLED TO, READ BACK BY AND VERIFIED WITH: RN DAVIS RAMON AT 0404 ON 06/10/2019 BY MESSAN HOUEGNIFIO (NOTE) If result is NEGATIVE SARS-CoV-2 target nucleic acids are NOT DETECTED. The SARS-CoV-2 RNA is generally detectable in upper and lower  respiratory specimens during the acute phase of infection. The lowest  concentration of SARS-CoV-2 viral copies this assay can detect is 250  copies / mL. A negative result does not preclude SARS-CoV-2 infection  and should not be used as the sole basis for treatment or other  patient management decisions.  A negative result may occur with  improper specimen collection / handling, submission of specimen other  than nasopharyngeal swab, presence of viral mutation(s) within the  areas targeted by this assay, and inadequate number of viral copies  (<250 copies / mL). A negative result must be combined with clinical  observations, patient history, and epidemiological information. If result is POSITIVE SARS-CoV-2 target nucleic a cids are DETECTED. The SARS-CoV-2 RNA is generally detectable in upper and lower  respiratory specimens during the acute phase of infection.  Positive  results are indicative of active infection with SARS-CoV-2.  Clinical  correlation with patient history and other diagnostic information is  necessary to determine patient infection status.  Positive results do  not rule out bacterial infection or co-infection with other viruses. If result is PRESUMPTIVE POSTIVE SARS-CoV-2 nucleic acids MAY BE PRESENT.   A presumptive positive result was obtained on the submitted specimen  and confirmed on repeat testing.  While 2019 novel coronavirus  (SARS-CoV-2) nucleic acids may be present in the submitted sample  additional confirmatory testing may be necessary for  epidemiological  and / or clinical management purposes  to differentiate between  SARS-CoV-2 and other Sarbecovirus currently known to infect humans.  If clinically indicated additional testing with an alternate test  met hodology [phone]) is advised. The SARS-CoV-2 RNA is generally  detectable in upper and lower respiratory specimens during the acute  phase of infection. The expected result is Negative. Fact Sheet for Patients:  StrictlyIdeas.no Fact Sheet for Healthcare Providers: BankingDealers.co.za This test is not yet approved or cleared by the [place] FDA and has been authorized for detection and/or diagnosis of SARS-CoV-2 by FDA under an Emergency Use Authorization (EUA).  This EUA will remain in effect (meaning this test can be used) for the duration of the COVID-19 declaration under Section 564(b)(1) of the Act, 21 U.S.C. section 360bbb-3(b)(1), unless the authorization is terminated or revoked sooner. Performed at [name], [place] [address], [place], [place] [address]   MRSA PCR Screening     Status: None   Collection Time: 06/10/19 12:31 PM   Specimen: Nasal Mucosa; Nasopharyngeal  Result Value Ref Range Status   MRSA by PCR NEGATIVE NEGATIVE Final    Comment:        The GeneXpert MRSA Assay (FDA approved for NASAL specimens only), is one component of a comprehensive MRSA colonization surveillance program. It is not intended to diagnose MRSA infection nor to guide or monitor treatment for MRSA infections. Performed at [facility], [place] [address] [place], [place] [address]      Time coordinating discharge: Over 30 minutes  SIGNED:   Charlynne Cousins, MD  [facility] 06/15/2019, 9:10 AM Pager  If 7PM-7AM, please contact night-coverage [email or web address] Password [email or web address]

## 2019-06-16 ENCOUNTER — Ambulatory Visit: Payer: Federal, State, Local not specified - PPO | Admitting: Internal Medicine

## 2019-06-16 LAB — GLUCOSE, CAPILLARY: Glucose-Capillary: 99 mg/dL (ref 70–99)

## 2019-06-17 ENCOUNTER — Encounter: Payer: Self-pay | Admitting: Internal Medicine

## 2019-06-17 ENCOUNTER — Ambulatory Visit (INDEPENDENT_AMBULATORY_CARE_PROVIDER_SITE_OTHER): Payer: Federal, State, Local not specified - PPO | Admitting: Internal Medicine

## 2019-06-17 DIAGNOSIS — R5383 Other fatigue: Secondary | ICD-10-CM

## 2019-06-17 DIAGNOSIS — J9601 Acute respiratory failure with hypoxia: Secondary | ICD-10-CM

## 2019-06-17 DIAGNOSIS — Z0279 Encounter for issue of other medical certificate: Secondary | ICD-10-CM

## 2019-06-17 DIAGNOSIS — U071 COVID-19: Secondary | ICD-10-CM | POA: Diagnosis not present

## 2019-06-17 DIAGNOSIS — J129 Viral pneumonia, unspecified: Secondary | ICD-10-CM

## 2019-06-17 NOTE — Progress Notes (Signed)
Virtual Visit via Video Note  I connected with Debbie Walter on 06/17/19 at  8:45 AM EDT by a video enabled telemedicine application and verified that I am speaking with the correct person using two identifiers.  Location: Patient: Home Provider: Office   I discussed the limitations of evaluation and management by telemedicine and the availability of in person appointments. The patient expressed understanding and agreed to proceed.  History of Present Illness:  Pt due for hospital follow up. She went to the ER 9/22 with c/o fever, chills, body aches, shortness of breath, nausea and vomiting. She reports this started 3 days prior to admission. She had actually went to [facility] for the same, diagnosed with COVID 19. When she got to the ER, her sats were 87% on room air. Chest xray was concerning for pneumonia. She was treated with oxygen via [place], IV steroids, IV antiviral Remdesivir and inhalers. She is also taking Vit C and Zinc. Since discharge on 9/27, she reports most of her symptoms have improved. She is still fatigued and weak, and her sense of taste has not resolved. She is resting, drinking fluids. She has not continued the Vit C or Zinc. She does need FMLA paperwork filled out.   Past Medical History:  Diagnosis Date  . Fibroids   . GERD (gastroesophageal reflux disease)    takes protonix  . History of kidney stones   . Hypertension   . MRSA infection    under arm  . Panic attacks   . PCOS (polycystic ovarian syndrome)    takes metformin for this    Current Outpatient Medications  Medication Sig Dispense Refill  . aspirin EC 81 MG tablet Take 81 mg by mouth daily.    . busPIRone (BUSPAR) 5 MG tablet TAKE 1 TABLET BY MOUTH EVERY DAY (Patient taking differently: Take 5 mg by mouth daily. ) 90 tablet 0  . cholecalciferol (VITAMIN D) 1000 UNITS tablet Take 1,000 Units by mouth daily.     . cyanocobalamin 500 MCG tablet Take 1 tablet (500 mcg total) by mouth daily.    Marland Kitchen FLUoxetine  (PROZAC) 10 MG tablet TAKE 1 TABLET BY MOUTH EVERY DAY (Patient taking differently: Take 10 mg by mouth daily. ) 90 tablet 0  . metFORMIN (GLUCOPHAGE) 500 MG tablet Take 2 tablets (1,000 mg total) by mouth 2 (two) times daily with a meal. 180 tablet 3  . nitroGLYCERIN (NITROSTAT) 0.4 MG SL tablet Place 0.4 mg under the tongue every 5 (five) minutes as needed for chest pain.   1  . Omega-3 Fatty Acids (FISH OIL) 1000 MG CAPS Take 1 capsule by mouth daily.    . ondansetron (ZOFRAN ODT) 4 MG disintegrating tablet Take 1 tablet (4 mg total) by mouth every 8 (eight) hours as needed for nausea or vomiting. 20 tablet 0  . pantoprazole (PROTONIX) 40 MG tablet TAKE 1 TABLET BY MOUTH EVERY DAY (Patient taking differently: Take 40 mg by mouth daily. ) 90 tablet 2  . propranolol ER (INDERAL LA) 160 MG SR capsule Take 1 capsule (160 mg total) by mouth daily. 90 capsule 2  . simvastatin (ZOCOR) 40 MG tablet Take 20 mg by mouth every evening.    Marland Kitchen spironolactone (ALDACTONE) 50 MG tablet Take 1 tablet (50 mg total) by mouth daily. 30 tablet 6  . valsartan (DIOVAN) 320 MG tablet TAKE 1 TABLET BY MOUTH EVERY DAY (Patient taking differently: Take 320 mg by mouth daily. ) 90 tablet 1   No  current facility-administered medications for this visit.     Allergies  Allergen Reactions  . Procardia [Nifedipine] Other (See Comments)    headache  . Wellbutrin [Bupropion] Other (See Comments)    "Makes my blood pressure go up - I don't ever want to take it again"  . Labetalol Other (See Comments)    "makes me feel like things are crawling through my head"  . Hydromorphone Other (See Comments)    Makes pt feel out of this world. Patient states it needs to be pushed slow, but she can take it.  . Morphine And Related     Needs to be pushed slow or pt feels like she will code  . Sudafed [Pseudoephedrine Hcl]     Heart race / dries out mucus membranes  . Zithromax [Azithromycin] Other (See Comments)    Gi upset.  Diarrhea     Family History  Problem Relation Age of Onset  . Hyperlipidemia Mother   . Heart disease Mother   . Diabetes Mother   . Hypertension Mother   . Hyperlipidemia Father   . Hypertension Father   . Cancer Father        rectal  . Hypertension Sister   . Anesthesia problems Neg Hx   . Hypotension Neg Hx   . Malignant hyperthermia Neg Hx   . Pseudochol deficiency Neg Hx     Social History   Socioeconomic History  . Marital status: Married    Spouse name: Not on file  . Number of children: Not on file  . Years of education: Not on file  . Highest education level: Not on file  Occupational History  . Not on file  Social Needs  . Financial resource strain: Not on file  . Food insecurity    Worry: Not on file    Inability: Not on file  . Transportation needs    Medical: Not on file    Non-medical: Not on file  Tobacco Use  . Smoking status: Never Smoker  . Smokeless tobacco: Never Used  Substance and Sexual Activity  . Alcohol use: No    Alcohol/week: 0.0 standard drinks  . Drug use: No  . Sexual activity: Yes    Birth control/protection: None  Lifestyle  . Physical activity    Days per week: Not on file    Minutes per session: Not on file  . Stress: Not on file  Relationships  . Social Herbalist on phone: Not on file    Gets together: Not on file    Attends religious service: Not on file    Active member of club or organization: Not on file    Attends meetings of clubs or organizations: Not on file    Relationship status: Not on file  . Intimate partner violence    Fear of current or ex partner: Not on file    Emotionally abused: Not on file    Physically abused: Not on file    Forced sexual activity: Not on file  Other Topics Concern  . Not on file  Social History Narrative  . Not on file     Constitutional: Pt reports fatigue, malaise. Denies fever, headache or abrupt weight changes.  HEENT: Denies eye pain, eye redness, ear pain, ringing  in the ears, wax buildup, runny nose, nasal congestion, bloody nose, or sore throat. Respiratory: Denies difficulty breathing, shortness of breath, cough or sputum production.   Cardiovascular: Denies chest pain, chest tightness, palpitations  or swelling in the hands or feet.  Gastrointestinal: Pt reports loss of taste. Denies abdominal pain, bloating, constipation, diarrhea or blood in the stool.  GU: Denies urgency, frequency, pain with urination, burning sensation, blood in urine, odor or discharge.  No other specific complaints in a complete review of systems (except as listed in HPI above).    Observations/Objective:  LMP 06/08/2019  Wt Readings from Last 3 Encounters:  06/10/19 230 lb (104.3 kg)  03/26/19 229 lb (103.9 kg)  03/05/19 230 lb (104.3 kg)    General: Appears her stated age, obese, in NAD. Pulmonary/Chest: Normal effort. No respiratory distress. No wheezes, rales or ronchi noted.  Neurological: Alert and oriented.    BMET    Component Value Date/Time   NA 137 06/14/2019 1610   K 4.0 06/14/2019 1610   CL 104 06/14/2019 1610   CO2 23 06/14/2019 1610   GLUCOSE 101 (H) 06/14/2019 1610   BUN 18 06/14/2019 1610   CREATININE 0.85 06/14/2019 1610   CREATININE 0.76 02/08/2017 1705   CALCIUM 8.3 (L) 06/14/2019 1610   GFRNONAA >60 06/14/2019 1610   GFRNONAA >89 02/08/2017 1705   GFRAA >60 06/14/2019 1610   GFRAA >89 02/08/2017 1705    Lipid Panel     Component Value Date/Time   CHOL 121 03/26/2019 1540   TRIG 202.0 (H) 03/26/2019 1540   HDL 37.30 (L) 03/26/2019 1540   CHOLHDL 3 03/26/2019 1540   VLDL 40.4 (H) 03/26/2019 1540   LDLCALC 50 09/26/2017 1456    CBC    Component Value Date/Time   WBC 7.9 06/13/2019 0322   RBC 4.63 06/13/2019 0322   HGB 12.2 06/13/2019 0322   HCT 38.9 06/13/2019 0322   PLT 383 06/13/2019 0322   MCV 84.0 06/13/2019 0322   MCH 26.3 06/13/2019 0322   MCHC 31.4 06/13/2019 0322   RDW 14.3 06/13/2019 0322   LYMPHSABS 1.7  06/13/2019 0322   MONOABS 0.8 06/13/2019 0322   EOSABS 0.0 06/13/2019 0322   BASOSABS 0.1 06/13/2019 0322    Hgb A1C Lab Results  Component Value Date   HGBA1C 6.5 03/26/2019       Assessment and Plan:  Hospital Follow Up for COVID 19, Pneumonia, Acute Hypoxic Respiratory Failure:  Hospital notes, labs and imaging reviewed Advised her to continue Zinc and Vit C OTC Encouraged rest and plenty of fluids FMLA paperwork provided  Return precautions discussed Webb Silversmith, NP  Follow Up Instructions:    I discussed the assessment and treatment plan with the patient. The patient was provided an opportunity to ask questions and all were answered. The patient agreed with the plan and demonstrated an understanding of the instructions.   The patient was advised to call back or seek an in-person evaluation if the symptoms worsen or if the condition fails to improve as anticipated.     Webb Silversmith, NP

## 2019-06-17 NOTE — Patient Instructions (Signed)
COVID-19 Frequently Asked Questions °COVID-19 (coronavirus disease) is an infection that is caused by a large family of viruses. Some viruses cause illness in people and others cause illness in animals like camels, cats, and bats. In some cases, the viruses that cause illness in animals can spread to humans. °Where did the coronavirus come from? °In December 2019, [place] told the World Health Organization (WHO) of several cases of lung disease (human respiratory illness). These cases were linked to an open seafood and livestock market in the city of [place]. The link to the seafood and livestock market suggests that the virus may have spread from animals to humans. However, since that first outbreak in December, the virus has also been shown to spread from person to person. °What is the name of the disease and the virus? °Disease name °Early on, this disease was called novel coronavirus. This is because scientists determined that the disease was caused by a new (novel) respiratory virus. The World Health Organization (WHO) has now named the disease COVID-19, or coronavirus disease. °Virus name °The virus that causes the disease is called severe acute respiratory syndrome coronavirus 2 (SARS-CoV-2). °More information on disease and virus naming °World Health Organization (WHO): www.who.int/emergencies/diseases/novel-coronavirus-2019/technical-guidance/naming-the-coronavirus-disease-(covid-2019)-and-the-virus-that-causes-it °Who is at risk for complications from coronavirus disease? °Some people may be at higher risk for complications from coronavirus disease. This includes older adults and people who have chronic diseases, such as heart disease, diabetes, and lung disease. °If you are at higher risk for complications, take these extra precautions: °· Avoid close contact with people who are sick or have a fever or cough. Stay at least 3-6 ft (1-2 m) away from them, if possible. °· Wash your hands often with soap and  water for at least 20 seconds. °· Avoid touching your face, mouth, nose, or eyes. °· Keep supplies on hand at home, such as food, medicine, and cleaning supplies. °· Stay home as much as possible. °· Avoid social gatherings and travel. °How does coronavirus disease spread? °The virus that causes coronavirus disease spreads easily from person to person (is contagious). There are also cases of community-spread disease. This means the disease has spread to: °· People who have no known contact with other infected people. °· People who have not traveled to areas where there are known cases. °It appears to spread from one person to another through droplets from coughing or sneezing. °Can I get the virus from touching surfaces or objects? °There is still a lot that we do not know about the virus that causes coronavirus disease. Scientists are basing a lot of information on what they know about similar viruses, such as: °· Viruses cannot generally survive on surfaces for long. They need a human body (host) to survive. °· It is more likely that the virus is spread by close contact with people who are sick (direct contact), such as through: °? Shaking hands or hugging. °? Breathing in respiratory droplets that travel through the air. This can happen when an infected person coughs or sneezes on or near other people. °· It is less likely that the virus is spread when a person touches a surface or object that has the virus on it (indirect contact). The virus may be able to enter the body if the person touches a surface or object and then touches his or her face, eyes, nose, or mouth. °Can a person spread the virus without having symptoms of the disease? °It may be possible for the virus to spread before a person   has symptoms of the disease, but this is most likely not the main way the virus is spreading. It is more likely for the virus to spread by being in close contact with people who are sick and breathing in the respiratory  droplets of a sick person's cough or sneeze. °What are the symptoms of coronavirus disease? °Symptoms vary from person to person and can range from mild to severe. Symptoms may include: °· Fever. °· Cough. °· Tiredness, weakness, or fatigue. °· Fast breathing or feeling short of breath. °These symptoms can appear anywhere from 2 to 14 days after you have been exposed to the virus. If you develop symptoms, call your health care provider. People with severe symptoms may need hospital care. °If I am exposed to the virus, how long does it take before symptoms start? °Symptoms of coronavirus disease may appear anywhere from 2 to 14 days after a person has been exposed to the virus. If you develop symptoms, call your health care provider. °Should I be tested for this virus? °Your health care provider will decide whether to test you based on your symptoms, history of exposure, and your risk factors. °How does a health care provider test for this virus? °Health care providers will collect samples to send for testing. Samples may include: °· Taking a swab of fluid from the nose. °· Taking fluid from the lungs by having you cough up mucus (sputum) into a sterile cup. °· Taking a blood sample. °· Taking a stool or urine sample. °Is there a treatment or vaccine for this virus? °Currently, there is no vaccine to prevent coronavirus disease. Also, there are no medicines like antibiotics or antivirals to treat the virus. A person who becomes sick is given supportive care, which means rest and fluids. A person may also relieve his or her symptoms by using over-the-counter medicines that treat sneezing, coughing, and runny nose. These are the same medicines that a person takes for the common cold. °If you develop symptoms, call your health care provider. People with severe symptoms may need hospital care. °What can I do to protect myself and my family from this virus? ° °  ° °You can protect yourself and your family by taking the  same actions that you would take to prevent the spread of other viruses. Take the following actions: °· Wash your hands often with soap and water for at least 20 seconds. If soap and water are not available, use alcohol-based hand sanitizer. °· Avoid touching your face, mouth, nose, or eyes. °· Cough or sneeze into a tissue, sleeve, or elbow. Do not cough or sneeze into your hand or the air. °? If you cough or sneeze into a tissue, throw it away immediately and wash your hands. °· Disinfect objects and surfaces that you frequently touch every day. °· Avoid close contact with people who are sick or have a fever or cough. Stay at least 3-6 ft (1-2 m) away from them, if possible. °· Stay home if you are sick, except to get medical care. Call your health care provider before you get medical care. °· Make sure your vaccines are up to date. Ask your health care provider what vaccines you need. °What should I do if I need to travel? °Follow travel recommendations from your local health authority, the CDC, and WHO. °Travel information and advice °· Centers for Disease Control and Prevention (CDC): www.cdc.gov/coronavirus/2019-ncov/travelers/index.html °· World Health Organization (WHO): www.who.int/emergencies/diseases/novel-coronavirus-2019/travel-advice °Know the risks and take action to protect your health °·   You are at higher risk of getting coronavirus disease if you are traveling to areas with an outbreak or if you are exposed to travelers from areas with an outbreak. °· Wash your hands often and practice good hygiene to lower the risk of catching or spreading the virus. °What should I do if I am sick? °General instructions to stop the spread of infection °· Wash your hands often with soap and water for at least 20 seconds. If soap and water are not available, use alcohol-based hand sanitizer. °· Cough or sneeze into a tissue, sleeve, or elbow. Do not cough or sneeze into your hand or the air. °· If you cough or  sneeze into a tissue, throw it away immediately and wash your hands. °· Stay home unless you must get medical care. Call your health care provider or local health authority before you get medical care. °· Avoid public areas. Do not take public transportation, if possible. °· If you can, wear a mask if you must go out of the house or if you are in close contact with someone who is not sick. °Keep your home clean °· Disinfect objects and surfaces that are frequently touched every day. This may include: °? Counters and tables. °? Doorknobs and light switches. °? Sinks and faucets. °? Electronics such as phones, remote controls, keyboards, computers, and tablets. °· Wash dishes in hot, soapy water or use a dishwasher. Air-dry your dishes. °· Wash laundry in hot water. °Prevent infecting other household members °· Let healthy household members care for children and pets, if possible. If you have to care for children or pets, wash your hands often and wear a mask. °· Sleep in a different bedroom or bed, if possible. °· Do not share personal items, such as razors, toothbrushes, deodorant, combs, brushes, towels, and washcloths. °Where to find more information °Centers for Disease Control and Prevention (CDC) °· Information and news updates: www.cdc.gov/coronavirus/2019-ncov °World Health Organization (WHO) °· Information and news updates: www.who.int/emergencies/diseases/novel-coronavirus-2019 °· Coronavirus health topic: www.who.int/health-topics/coronavirus °· Questions and answers on COVID-19: www.who.int/news-room/q-a-detail/q-a-coronaviruses °· Global tracker: who.sprinklr.com °American Academy of Pediatrics (AAP) °· Information for families: www.healthychildren.org/English/health-issues/conditions/chest-lungs/Pages/2019-Novel-Coronavirus.aspx °The coronavirus situation is changing rapidly. Check your local health authority website or the CDC and WHO websites for updates and news. °When should I contact a health care  provider? °· Contact your health care provider if you have symptoms of an infection, such as fever or cough, and you: °? Have been near anyone who is known to have coronavirus disease. °? Have come into contact with a person who is suspected to have coronavirus disease. °? Have traveled outside of the country. °When should I get emergency medical care? °· Get help right away by calling your local emergency services (911 in the [place] if you have: °? Trouble breathing. °? Pain or pressure in your chest. °? Confusion. °? Blue-tinged lips and fingernails. °? Difficulty waking from sleep. °? Symptoms that get worse. °Let the emergency medical personnel know if you think you have coronavirus disease. °Summary °· A new respiratory virus is spreading from person to person and causing COVID-19 (coronavirus disease). °· The virus that causes COVID-19 appears to spread easily. It spreads from one person to another through droplets from coughing or sneezing. °· Older adults and those with chronic diseases are at higher risk of disease. If you are at higher risk for complications, take extra precautions. °· There is currently no vaccine to prevent coronavirus disease. There are no medicines, such as antibiotics or   antivirals, to treat the virus. °· You can protect yourself and your family by washing your hands often, avoiding touching your face, and covering your coughs and sneezes. °This information is not intended to replace advice given to you by your health care provider. Make sure you discuss any questions you have with your health care provider. °Document Released: 12/31/2018 Document Revised: 12/31/2018 Document Reviewed: 12/31/2018 °Elsevier Patient Education © 2020 [employer] Inc[employer] ° °

## 2019-06-17 NOTE — Telephone Encounter (Signed)
Paperwork faxed Pt aware Copy for pt Copy for scan Copy for billing

## 2019-06-18 ENCOUNTER — Telehealth: Payer: Self-pay | Admitting: Internal Medicine

## 2019-06-18 NOTE — Telephone Encounter (Signed)
Patient called to make sure you were aware that on her leave of absence she will be out until Oct 12th. She just wanted to make sure that this was the date you had on her paperwork

## 2019-06-18 NOTE — Telephone Encounter (Signed)
Spoke with pt her leave is for 9/21 to 10/12

## 2019-06-20 ENCOUNTER — Telehealth: Payer: Self-pay | Admitting: Internal Medicine

## 2019-06-20 NOTE — Telephone Encounter (Signed)
Left message asking pt to call office Regarding fmla paperwork  Please see Shirlean Mylar

## 2019-06-20 NOTE — Telephone Encounter (Signed)
Spoke with pt she wanted me to refaxed the original paperwork back to [employer]

## 2019-06-20 NOTE — Telephone Encounter (Signed)
[employer] faxed paperwork wanting additional information  In Debbie Walter's in box

## 2019-06-20 NOTE — Telephone Encounter (Signed)
Patient returned Robin's call.  Patient can be reached at [phone]. Patient said she was sorry she missed Robin's call.

## 2019-06-21 NOTE — Telephone Encounter (Signed)
Done, will get back to Robin on Monday

## 2019-06-23 ENCOUNTER — Telehealth: Payer: Self-pay | Admitting: Internal Medicine

## 2019-06-23 NOTE — Telephone Encounter (Signed)
Patient stated that she is needing a refill on her Pantoprazole. She spoke with the pharmacy and they stated she is needing a PA done for the medication for the 90 day supply.  Patient stated she is completely out of medication and needs this as soon as possible since she has been without it for a few days now.  [place]

## 2019-06-24 NOTE — Telephone Encounter (Signed)
Paperwork faxed °

## 2019-06-26 NOTE — Telephone Encounter (Signed)
Best number [phone]  Pt called checking on her PA for protonix For 90 days.  Pt has been out of her meds over a week.  If you cannot send 90 day supply please send 30day so pt can have some meds  [facility]  Please advise pt when this has been done

## 2019-06-27 ENCOUNTER — Other Ambulatory Visit: Payer: Self-pay

## 2019-06-27 NOTE — Telephone Encounter (Signed)
Patient asked to have a 30 day rx called in to pharmacy.  Patient's hoping it can be authorized, so she won't have to pay.  Patient uses [facility].  Patient's phone# [phone].

## 2019-06-27 NOTE — Telephone Encounter (Signed)
PA was completed via [email or web address]--Insurance ID# [id number]   The Prior Authorization request has been approved for Pantoprazole Sodium 40MG  OR TBEC. The authorization is valid from 05/28/2019 through 06/26/2020  Called pharmacy to confirm  Pt is aware as instructed

## 2019-06-27 NOTE — Telephone Encounter (Signed)
Copy for pt °Copy for scan °

## 2019-07-09 ENCOUNTER — Other Ambulatory Visit: Payer: Self-pay

## 2019-07-09 ENCOUNTER — Telehealth: Payer: Federal, State, Local not specified - PPO | Admitting: Cardiology

## 2019-07-09 ENCOUNTER — Encounter: Payer: Self-pay | Admitting: Cardiology

## 2019-07-09 VITALS — Ht 61.0 in | Wt 220.0 lb

## 2019-07-09 DIAGNOSIS — I10 Essential (primary) hypertension: Secondary | ICD-10-CM | POA: Diagnosis not present

## 2019-07-09 DIAGNOSIS — R0609 Other forms of dyspnea: Secondary | ICD-10-CM

## 2019-07-09 DIAGNOSIS — R0600 Dyspnea, unspecified: Secondary | ICD-10-CM

## 2019-07-09 NOTE — Progress Notes (Signed)
Primary Physician:  [name], NP   Patient ID: [name], female    DOB: [birth year], 38 y.o.   MRN: [id number]  Subjective:    Chief Complaint  Patient presents with   Hypertension   Obesity   Follow-up    55mo   This visit type was conducted due to national recommendations for restrictions regarding the COVID-19 Pandemic (e.g. social distancing).  This format is felt to be most appropriate for this patient at this time.  All issues noted in this document were discussed and addressed.  No physical exam was performed (except for noted visual exam findings with Telehealth visits).  The patient has consented to conduct a Telehealth visit and understands insurance will be billed.   I discussed the limitations of evaluation and management by telemedicine and the availability of in person appointments. The patient expressed understanding and agreed to proceed.  Virtual Visit via Video Note is as below  I connected with@, on 07/09/2019 at 1500 by a video enabled telemedicine application and verified that I am speaking with the correct person using two identifiers. Due to call continuing to be dropped, visit was finished with telephone appointment.     I have discussed with her regarding the safety during COVID Pandemic and steps and precautions including social distancing with the patient.    HPI: Debbie Walter  is a 38 y.o. female  with hypertension, morbid obesity, polycystic ovarian syndrome, severe anxiety, and insulin resistance.  Presents today for 3 month office visit follow-up for hypertension. Patient has had difficulty with controlling her blood pressure for the last several years and has had negative renal Dopplers in the past. Denies chest pain, PND, orthopnea, palpitations, syncopal episodes, or symptoms of claudication or TIA. Has chronic shortness of breath on exertion.  Patient was found to have Covid and viral pneumonia requiring hospitalization for 1 week in  the end of September, she is feeling much better over the last few weeks, but does continue to have dyspnea on exertion that is worse from her baseline.  She does feel that this is slowly getting better with time.  She did not have a way to check her blood pressure today, but states that it has been stable and was actually low during her hospitalization.    She states that she lost weight during her hospitalization, but has gained some of her weight back since being at home.  She was referred to Dr. Leafy Ro after last visit, but has yet to hear about an appointment.  She is interested in bariatric procedure and request information regarding this.    Past Medical History:  Diagnosis Date   Fibroids    GERD (gastroesophageal reflux disease)    takes protonix   History of kidney stones    Hypertension    MRSA infection    under arm   Panic attacks    PCOS (polycystic ovarian syndrome)    takes metformin for this    Past Surgical History:  Procedure Laterality Date   CESAREAN SECTION     CESAREAN SECTION WITH BILATERAL TUBAL LIGATION Bilateral 05/15/2014   Procedure: CESAREAN SECTION WITH BILATERAL TUBAL LIGATION;  Surgeon: Cyril Mourning, MD;  Location: [facility];  Service: Obstetrics;  Laterality: Bilateral;  repeat  edc 06/10/14   CHOLECYSTECTOMY N/A 10/31/2017   Procedure: LAPAROSCOPIC CHOLECYSTECTOMY;  Surgeon: Coralie Keens, MD;  Location: [facility];  Service: General;  Laterality: N/A;   HYSTEROSCOPY     PARATHYROIDECTOMY  REMOVED PART OF THYROID    PARATHYROIDECTOMY      Social History   Socioeconomic History   Marital status: Married    Spouse name: Not on file   Number of children: 2   Years of education: Not on file   Highest education level: Not on file  Occupational History   Not on file  Social Needs   Financial resource strain: Not on file   Food insecurity    Worry: Not on file    Inability: Not on file   Transportation needs     Medical: Not on file    Non-medical: Not on file  Tobacco Use   Smoking status: Never Smoker   Smokeless tobacco: Never Used  Substance and Sexual Activity   Alcohol use: No    Alcohol/week: 0.0 standard drinks   Drug use: No   Sexual activity: Yes    Birth control/protection: None  Lifestyle   Physical activity    Days per week: Not on file    Minutes per session: Not on file   Stress: Not on file  Relationships   Social connections    Talks on phone: Not on file    Gets together: Not on file    Attends religious service: Not on file    Active member of club or organization: Not on file    Attends meetings of clubs or organizations: Not on file    Relationship status: Not on file   Intimate partner violence    Fear of current or ex partner: Not on file    Emotionally abused: Not on file    Physically abused: Not on file    Forced sexual activity: Not on file  Other Topics Concern   Not on file  Social History Narrative   Not on file    Review of Systems  Constitution: Negative for decreased appetite, malaise/fatigue, weight gain and weight loss.  Eyes: Negative for visual disturbance.  Cardiovascular: Positive for dyspnea on exertion. Negative for chest pain, claudication, leg swelling, orthopnea, palpitations and syncope.  Respiratory: Negative for hemoptysis and wheezing.   Endocrine: Negative for cold intolerance and heat intolerance.  Hematologic/Lymphatic: Does not bruise/bleed easily.  Skin: Negative for nail changes.  Musculoskeletal: Negative for muscle weakness and myalgias.  Gastrointestinal: Negative for abdominal pain, change in bowel habit, nausea and vomiting.  Neurological: Negative for difficulty with concentration, dizziness, focal weakness and headaches.  Psychiatric/Behavioral: Positive for depression (and anxiety). Negative for altered mental status and suicidal ideas.  All other systems reviewed and are negative.     Objective:    Height 5\' 1"  (1.549 m), weight 220 lb (99.8 kg). Body mass index is 41.57 kg/m.     Physical Exam  Constitutional: She appears well-developed and well-nourished. No distress.  Morbidly obese  HENT:  Head: Atraumatic.  Eyes: Conjunctivae are normal.  Neck: Neck supple. No JVD present. No thyromegaly present.  Short neck and difficult to evaluate JVP  Cardiovascular: Normal rate, regular rhythm and intact distal pulses. Exam reveals no gallop.  Murmur heard.  Early systolic murmur is present with a grade of 1/6. Aortic Area - Radiates to carotids Pulses:      Carotid pulses are on the right side with bruit and 2+ on the left side.      Dorsalis pedis pulses are 2+ on the right side and 2+ on the left side.       Posterior tibial pulses are 2+ on the right side and  2+ on the left side.  Femoral and popliteal pulse difficult to feel due to patient's body habitus.   Carotid arteries - Right-Soft Bruit(conducted sound from aortic SEM  Pulmonary/Chest: Effort normal and breath sounds normal.  Abdominal: Soft. Bowel sounds are normal.  Musculoskeletal: Normal range of motion.        General: No edema.  Neurological: She is alert.  Skin: Skin is warm and dry.  Psychiatric: She has a normal mood and affect.   Radiology: No results found.  Laboratory examination:    CMP Latest Ref Rng & Units 06/14/2019 06/13/2019 06/12/2019  Glucose 70 - 99 mg/dL 101(H) 83 103(H)  BUN 6 - 20 mg/dL 18 18 19   Creatinine 0.44 - 1.00 mg/dL 0.85 0.70 0.76  Sodium 135 - 145 mmol/L 137 142 140  Potassium 3.5 - 5.1 mmol/L 4.0 3.9 3.9  Chloride 98 - 111 mmol/L 104 108 105  CO2 22 - 32 mmol/L 23 26 25   Calcium 8.9 - 10.3 mg/dL 8.3(L) 7.9(L) 8.1(L)  Total Protein 6.5 - 8.1 g/dL 6.1(L) 6.3(L) 7.1  Total Bilirubin 0.3 - 1.2 mg/dL 0.3 0.4 0.2(L)  Alkaline Phos 38 - 126 U/L 43 39 45  AST 15 - 41 U/L 25 18 23   ALT 0 - 44 U/L 39 25 23   CBC Latest Ref Rng & Units 06/13/2019 06/12/2019 06/11/2019  WBC 4.0 -  10.5 K/uL 7.9 5.5 5.2  Hemoglobin 12.0 - 15.0 g/dL 12.2 12.2 11.7(L)  Hematocrit 36.0 - 46.0 % 38.9 39.4 37.5  Platelets 150 - 400 K/uL 383 349 276   Lipid Panel     Component Value Date/Time   CHOL 121 03/26/2019 1540   TRIG 202.0 (H) 03/26/2019 1540   HDL 37.30 (L) 03/26/2019 1540   CHOLHDL 3 03/26/2019 1540   VLDL 40.4 (H) 03/26/2019 1540   LDLCALC 50 09/26/2017 1456   LDLDIRECT 74.0 03/26/2019 1540   HEMOGLOBIN A1C Lab Results  Component Value Date   HGBA1C 6.5 03/26/2019   TSH Recent Labs    03/26/19 1540  TSH 2.17    PRN Meds:. There are no discontinued medications. Current Meds  Medication Sig   Ascorbic Acid (VITAMIN C) 1000 MG tablet Take 1,000 mg by mouth daily.   aspirin EC 81 MG tablet Take 81 mg by mouth daily.   busPIRone (BUSPAR) 5 MG tablet TAKE 1 TABLET BY MOUTH EVERY DAY   cholecalciferol (VITAMIN D) 1000 UNITS tablet Take 1,000 Units by mouth daily.    cyanocobalamin 500 MCG tablet Take 1 tablet (500 mcg total) by mouth daily.   FLUoxetine (PROZAC) 10 MG tablet TAKE 1 TABLET BY MOUTH EVERY DAY   fluticasone (FLONASE) 50 MCG/ACT nasal spray as needed.   metFORMIN (GLUCOPHAGE) 500 MG tablet Take 2 tablets (1,000 mg total) by mouth 2 (two) times daily with a meal.   nitroGLYCERIN (NITROSTAT) 0.4 MG SL tablet Place 0.4 mg under the tongue every 5 (five) minutes as needed for chest pain.    Omega-3 Fatty Acids (FISH OIL) 1000 MG CAPS Take 1 capsule by mouth daily.   ondansetron (ZOFRAN ODT) 4 MG disintegrating tablet Take 1 tablet (4 mg total) by mouth every 8 (eight) hours as needed for nausea or vomiting.   pantoprazole (PROTONIX) 40 MG tablet TAKE 1 TABLET BY MOUTH EVERY DAY (Patient taking differently: Take 40 mg by mouth daily. )   propranolol ER (INDERAL LA) 160 MG SR capsule Take 1 capsule (160 mg total) by mouth daily.   simvastatin (  ZOCOR) 40 MG tablet Take 20 mg by mouth every evening.   spironolactone (ALDACTONE) 50 MG tablet Take  1 tablet (50 mg total) by mouth daily.   valsartan (DIOVAN) 320 MG tablet TAKE 1 TABLET BY MOUTH EVERY DAY (Patient taking differently: Take 320 mg by mouth daily. )   zinc gluconate 50 MG tablet Take 50 mg by mouth daily.    Cardiac Studies:   Echocardiogram 02/17/2016: Left ventricle cavity is normal in size. Mild concentric hypertrophy of the left ventricle. Normal global wall motion. Normal diastolic filling pattern. Calculated EF 72%. Left atrial cavity is normal in size. Aneurysmal interatrial septum with probable small incidental PFO. Trace mitral regurgitation. Trace tricuspid regurgitation. Unable to estimate PA pressure due to absence/minimal TR signal.  Exercise sestamibi stress test 02/21/2016: 1. Resting EKG demonstrates normal sinus rhythm, normal axis, nonspecific inferior and lateral ST segment depression with T-wave inversion. Cannot exclude ischemia. Stress EKG was equivocal for ischemia with PVCs noted during peak exercise but no additional ST-T wave changes of ischemia. Patient exercised on a Bruce protocol for 4 minutes and 48 seconds and achieved 6.74 METS. Stress terminated due to fatigue. Normal blood pressure response. Low exercise tolerance for age. 2. Raw images reveal prominent breast attenuation. The perfusion imaging study demonstrates the left ventricle to be normal in both rest and stress images. There is a small sized mild ischemia in the basal anterior and anteroseptal region and basal to mid inferior wall. Left ventricular systolic function calculated QGS was mildly depressed at 47%. This is an intermediate risk study, clinical correlation recommended.  Assessment:     ICD-10-CM   1. Essential hypertension  I10   2. Dyspnea on exertion  R06.00   3. Morbid obesity ([place])  E66.01     EKG 11/27/2018: Normal sinus rhythm at 70 bpm, normal axis, nonspecific ST and T wave changes in inferior leads, cannot exclude ischemia. Compared to previous EKG  05/01/2018, ST and T wave changes are more prominent in leads V5 and V6.   Recommendations:   Patient is here for 1-month office visit and follow-up for hypertension.  She did not have a way to check her blood pressure today, but during her recent hospitalization her blood pressure was stable.  I have encouraged her to get a home blood pressure cuff to start monitoring regularly.  Her dyspnea on exertion has worsened since pneumonia and Covid infection, but is improving.  Advised to that this may take several weeks to fully recover.  Encouraged her to notify me if her dyspnea does not return to her baseline.  I suspect her obesity is also contributing to her dyspnea.  She was referred to weight loss center at her last office visit, but has not yet heard about an appointment.  She is requesting information for bariatric procedure, will give her information regarding bariatric program with Dr. Hassell Done.  Encouraged her to continue to work on her diet and start regular exercise to also help with this.  No changes were made to her medications today.  I will plan to see her in approximately 3 to 4 months for follow-up on her weight and hypertension.  Miquel Dunn, MSN, APRN, FNP-C [facility]. [place] Office: [phone] Fax: [phone]

## 2019-07-23 ENCOUNTER — Other Ambulatory Visit: Payer: Self-pay

## 2019-07-23 MED ORDER — SIMVASTATIN 40 MG PO TABS: 20.0000 mg | ORAL_TABLET | Freq: Every evening | ORAL | 1 refills | Status: DC

## 2019-09-02 ENCOUNTER — Telehealth: Payer: Self-pay | Admitting: Internal Medicine

## 2019-09-02 NOTE — Telephone Encounter (Signed)
Left message asking pt to call office  Regarding FMLA See robin

## 2019-09-04 NOTE — Telephone Encounter (Signed)
FMLA paperwork in [employer] in box  For review and signature

## 2019-09-04 NOTE — Telephone Encounter (Signed)
Done, given back to Robin 

## 2019-09-04 NOTE — Telephone Encounter (Signed)
Paperwork faxed °

## 2019-09-10 NOTE — Telephone Encounter (Signed)
Left message asking pt to call office  Please let pt know paperwork faxed 12/17  There is a copy here for her  Copy for pt Copy for scan

## 2019-09-18 NOTE — Telephone Encounter (Signed)
Patient called. Advised of message below

## 2019-09-25 ENCOUNTER — Other Ambulatory Visit: Payer: Self-pay

## 2019-09-25 ENCOUNTER — Other Ambulatory Visit: Payer: Self-pay | Admitting: Internal Medicine

## 2019-09-25 MED ORDER — VALSARTAN 320 MG PO TABS: 320.0000 mg | ORAL_TABLET | Freq: Every day | ORAL | 0 refills | Status: DC

## 2019-09-26 DIAGNOSIS — Z01419 Encounter for gynecological examination (general) (routine) without abnormal findings: Secondary | ICD-10-CM | POA: Diagnosis not present

## 2019-09-26 DIAGNOSIS — E282 Polycystic ovarian syndrome: Secondary | ICD-10-CM | POA: Diagnosis not present

## 2019-09-26 DIAGNOSIS — Z1231 Encounter for screening mammogram for malignant neoplasm of breast: Secondary | ICD-10-CM | POA: Diagnosis not present

## 2019-09-26 DIAGNOSIS — H10503 Unspecified blepharoconjunctivitis, bilateral: Secondary | ICD-10-CM | POA: Diagnosis not present

## 2019-09-26 DIAGNOSIS — Z6841 Body Mass Index (BMI) 40.0 and over, adult: Secondary | ICD-10-CM | POA: Diagnosis not present

## 2019-09-30 ENCOUNTER — Other Ambulatory Visit: Payer: Self-pay | Admitting: Cardiology

## 2019-10-05 ENCOUNTER — Other Ambulatory Visit: Payer: Self-pay | Admitting: Internal Medicine

## 2019-10-08 ENCOUNTER — Ambulatory Visit: Payer: Federal, State, Local not specified - PPO | Admitting: Internal Medicine

## 2019-10-08 ENCOUNTER — Encounter: Payer: Self-pay | Admitting: Internal Medicine

## 2019-10-08 ENCOUNTER — Other Ambulatory Visit: Payer: Self-pay

## 2019-10-08 ENCOUNTER — Other Ambulatory Visit: Payer: Self-pay | Admitting: Cardiology

## 2019-10-08 VITALS — BP 136/86 | HR 76 | Temp 97.6°F | Wt 228.0 lb

## 2019-10-08 DIAGNOSIS — F329 Major depressive disorder, single episode, unspecified: Secondary | ICD-10-CM

## 2019-10-08 DIAGNOSIS — F32A Depression, unspecified: Secondary | ICD-10-CM

## 2019-10-08 DIAGNOSIS — F419 Anxiety disorder, unspecified: Secondary | ICD-10-CM

## 2019-10-08 DIAGNOSIS — L0232 Furuncle of buttock: Secondary | ICD-10-CM

## 2019-10-08 MED ORDER — HYDROXYZINE HCL 10 MG PO TABS: 10.0000 mg | ORAL_TABLET | Freq: Every day | ORAL | 0 refills | Status: DC | PRN

## 2019-10-08 MED ORDER — CHLORHEXIDINE GLUCONATE 4 % EX SOLN: 1.0000 "application " | CUTANEOUS | 5 refills | Status: DC

## 2019-10-08 NOTE — Patient Instructions (Signed)

## 2019-10-08 NOTE — Progress Notes (Signed)
Subjective:    Patient ID: Debbie Walter, female    DOB: Oct 04, 1980, 39 y.o.   MRN: [id number]  HPI  Pt presents to the clinic today with c/o a boil. She noticed this 1 week ago. It is located in between her buttocks. It has drained bloody drainage. She reports a history of boils under her armpits. She denies fever, chills, nausea, vomiting or body aches. She reports pain with sitting but otherwise it is not painful. She has been sitting in a warm bath with some relief.   She would also like a refill of Xanax that has not been filled since 04/2018. She is currently taking Buspar and Fluoxetine. She is not seeing a therapist.  Review of Systems  Past Medical History:  Diagnosis Date  . Fibroids   . GERD (gastroesophageal reflux disease)    takes protonix  . History of kidney stones   . Hypertension   . MRSA infection    under arm  . Panic attacks   . PCOS (polycystic ovarian syndrome)    takes metformin for this    Current Outpatient Medications  Medication Sig Dispense Refill  . Ascorbic Acid (VITAMIN C) 1000 MG tablet Take 1,000 mg by mouth daily.    Marland Kitchen aspirin EC 81 MG tablet Take 81 mg by mouth daily.    . busPIRone (BUSPAR) 5 MG tablet TAKE 1 TABLET BY MOUTH EVERY DAY 90 tablet 0  . cholecalciferol (VITAMIN D) 1000 UNITS tablet Take 1,000 Units by mouth daily.     . cyanocobalamin 500 MCG tablet Take 1 tablet (500 mcg total) by mouth daily.    Marland Kitchen FLUoxetine (PROZAC) 10 MG tablet TAKE 1 TABLET BY MOUTH EVERY DAY 90 tablet 0  . fluticasone (FLONASE) 50 MCG/ACT nasal spray as needed.    . metFORMIN (GLUCOPHAGE) 500 MG tablet Take 2 tablets (1,000 mg total) by mouth 2 (two) times daily with a meal. 180 tablet 3  . nitroGLYCERIN (NITROSTAT) 0.4 MG SL tablet Place 0.4 mg under the tongue every 5 (five) minutes as needed for chest pain.   1  . Omega-3 Fatty Acids (FISH OIL) 1000 MG CAPS Take 1 capsule by mouth daily.    . ondansetron (ZOFRAN ODT) 4 MG disintegrating tablet Take 1  tablet (4 mg total) by mouth every 8 (eight) hours as needed for nausea or vomiting. 20 tablet 0  . pantoprazole (PROTONIX) 40 MG tablet TAKE 1 TABLET BY MOUTH EVERY DAY (Patient taking differently: Take 40 mg by mouth daily. ) 90 tablet 2  . propranolol ER (INDERAL LA) 160 MG SR capsule TAKE 1 CAPSULE (160 MG TOTAL) BY MOUTH DAILY. 90 capsule 0  . simvastatin (ZOCOR) 40 MG tablet Take 0.5 tablets (20 mg total) by mouth every evening. 90 tablet 1  . spironolactone (ALDACTONE) 50 MG tablet TAKE 1 TABLET BY MOUTH EVERY DAY 90 tablet 2  . valsartan (DIOVAN) 320 MG tablet Take 1 tablet (320 mg total) by mouth daily. 90 tablet 0  . zinc gluconate 50 MG tablet Take 50 mg by mouth daily.     No current facility-administered medications for this visit.    Allergies  Allergen Reactions  . Procardia [Nifedipine] Other (See Comments)    headache  . Wellbutrin [Bupropion] Other (See Comments)    "Makes my blood pressure go up - I don't ever want to take it again"  . Labetalol Other (See Comments)    "makes me feel like things are crawling through my  head"  . Hydromorphone Other (See Comments)    Makes pt feel out of this world. Patient states it needs to be pushed slow, but she can take it.  . Morphine And Related     Needs to be pushed slow or pt feels like she will code  . Sudafed [Pseudoephedrine Hcl]     Heart race / dries out mucus membranes  . Zithromax [Azithromycin] Other (See Comments)    Gi upset.  Diarrhea    Family History  Problem Relation Age of Onset  . Hyperlipidemia Mother   . Heart disease Mother   . Diabetes Mother   . Hypertension Mother   . Hyperlipidemia Father   . Hypertension Father   . Cancer Father        rectal  . Hypertension Sister   . Anesthesia problems Neg Hx   . Hypotension Neg Hx   . Malignant hyperthermia Neg Hx   . Pseudochol deficiency Neg Hx     Social History   Socioeconomic History  . Marital status: Married    Spouse name: Not on file  .  Number of children: 2  . Years of education: Not on file  . Highest education level: Not on file  Occupational History  . Not on file  Tobacco Use  . Smoking status: Never Smoker  . Smokeless tobacco: Never Used  Substance and Sexual Activity  . Alcohol use: No    Alcohol/week: 0.0 standard drinks  . Drug use: No  . Sexual activity: Yes    Birth control/protection: None  Other Topics Concern  . Not on file  Social History Narrative  . Not on file   Social Determinants of Health   Financial Resource Strain:   . Difficulty of Paying Living Expenses: Not on file  Food Insecurity:   . Worried About Charity fundraiser in the Last Year: Not on file  . Ran Out of Food in the Last Year: Not on file  Transportation Needs:   . Lack of Transportation (Medical): Not on file  . Lack of Transportation (Non-Medical): Not on file  Physical Activity:   . Days of Exercise per Week: Not on file  . Minutes of Exercise per Session: Not on file  Stress:   . Feeling of Stress : Not on file  Social Connections:   . Frequency of Communication with Friends and Family: Not on file  . Frequency of Social Gatherings with Friends and Family: Not on file  . Attends Religious Services: Not on file  . Active Member of Clubs or Organizations: Not on file  . Attends Archivist Meetings: Not on file  . Marital Status: Not on file  Intimate Partner Violence:   . Fear of Current or Ex-Partner: Not on file  . Emotionally Abused: Not on file  . Physically Abused: Not on file  . Sexually Abused: Not on file     Constitutional: Denies fever, malaise, fatigue, headache or abrupt weight changes.  Skin: Pt reports boil.in between buttocks.  Psych: Pt has a history of anxiety and depression. Denies SI/HI.   No other specific complaints in a complete review of systems (except as listed in HPI above).     Objective:   Physical Exam  BP 136/86   Pulse 76   Temp 97.6 F (36.4 C) (Temporal)    Wt 228 lb (103.4 kg)   SpO2 98%   BMI 43.08 kg/m   Wt Readings from Last 3 Encounters:  07/09/19 220 lb (99.8 kg)  06/10/19 230 lb (104.3 kg)  03/26/19 229 lb (103.9 kg)    General: Appears herstated age, obese, in NAD. Skin: Open area right gluteal fold, bloody drainage. No surrounding redness. Psych: Normal mood and affect.   BMET    Component Value Date/Time   NA 137 06/14/2019 1610   K 4.0 06/14/2019 1610   CL 104 06/14/2019 1610   CO2 23 06/14/2019 1610   GLUCOSE 101 (H) 06/14/2019 1610   BUN 18 06/14/2019 1610   CREATININE 0.85 06/14/2019 1610   CREATININE 0.76 02/08/2017 1705   CALCIUM 8.3 (L) 06/14/2019 1610   GFRNONAA >60 06/14/2019 1610   GFRNONAA >89 02/08/2017 1705   GFRAA >60 06/14/2019 1610   GFRAA >89 02/08/2017 1705    Lipid Panel     Component Value Date/Time   CHOL 121 03/26/2019 1540   TRIG 202.0 (H) 03/26/2019 1540   HDL 37.30 (L) 03/26/2019 1540   CHOLHDL 3 03/26/2019 1540   VLDL 40.4 (H) 03/26/2019 1540   LDLCALC 50 09/26/2017 1456    CBC    Component Value Date/Time   WBC 7.9 06/13/2019 0322   RBC 4.63 06/13/2019 0322   HGB 12.2 06/13/2019 0322   HCT 38.9 06/13/2019 0322   PLT 383 06/13/2019 0322   MCV 84.0 06/13/2019 0322   MCH 26.3 06/13/2019 0322   MCHC 31.4 06/13/2019 0322   RDW 14.3 06/13/2019 0322   LYMPHSABS 1.7 06/13/2019 0322   MONOABS 0.8 06/13/2019 0322   EOSABS 0.0 06/13/2019 0322   BASOSABS 0.1 06/13/2019 0322    Hgb A1C Lab Results  Component Value Date   HGBA1C 6.5 03/26/2019           Assessment & Plan:   Hidradenitis:  Rx sent for Chlorhexdine solution- use twice weekly No indication for abx at this time Continue warm baths  Anxiety and Depression:  Continue Buspar and Fluoxetine Rx sent for Hydroxyzine 10mg  daily PRN- sedation caution given  Webb Silversmith, NP This visit occurred during the SARS-CoV-2 public health emergency.  Safety protocols were in place, including screening questions  prior to the visit, additional usage of staff PPE, and extensive cleaning of exam room while observing appropriate contact time as indicated for disinfecting solutions.

## 2019-10-28 IMAGING — CR DG CHEST 2V
2 series · 2 of 2 positions shown · non-contrast
Comparison: 12/13/2011

CLINICAL DATA: Pt states that she has been wheezing for about a
week. Wheezing is worse at night. Pt states productive cough with
yellow mucous. No known heart or lung conditions.Hx of HTN. Non
smoker.

EXAM:
CHEST - 2 VIEW

[w chest pa]
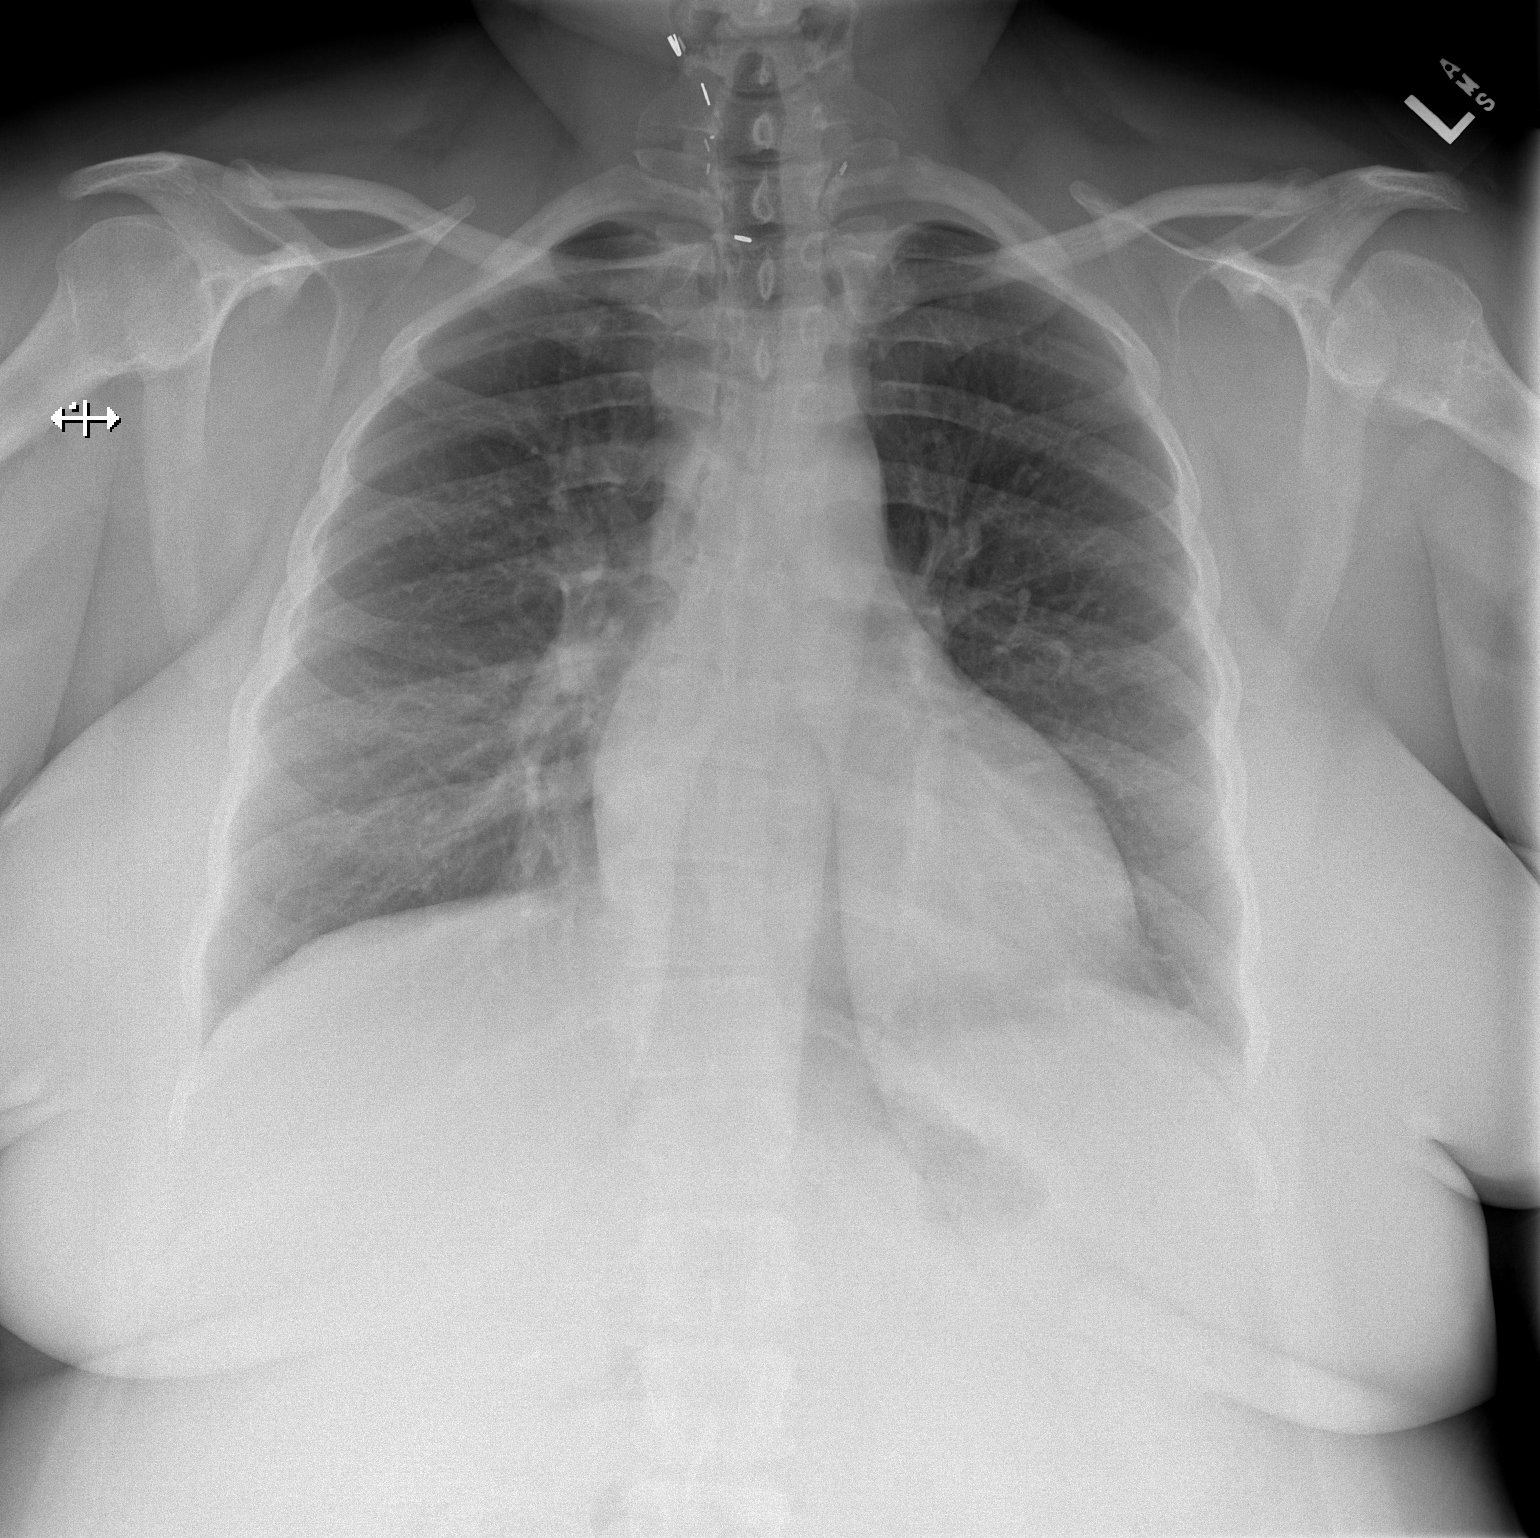

[w chest lat]
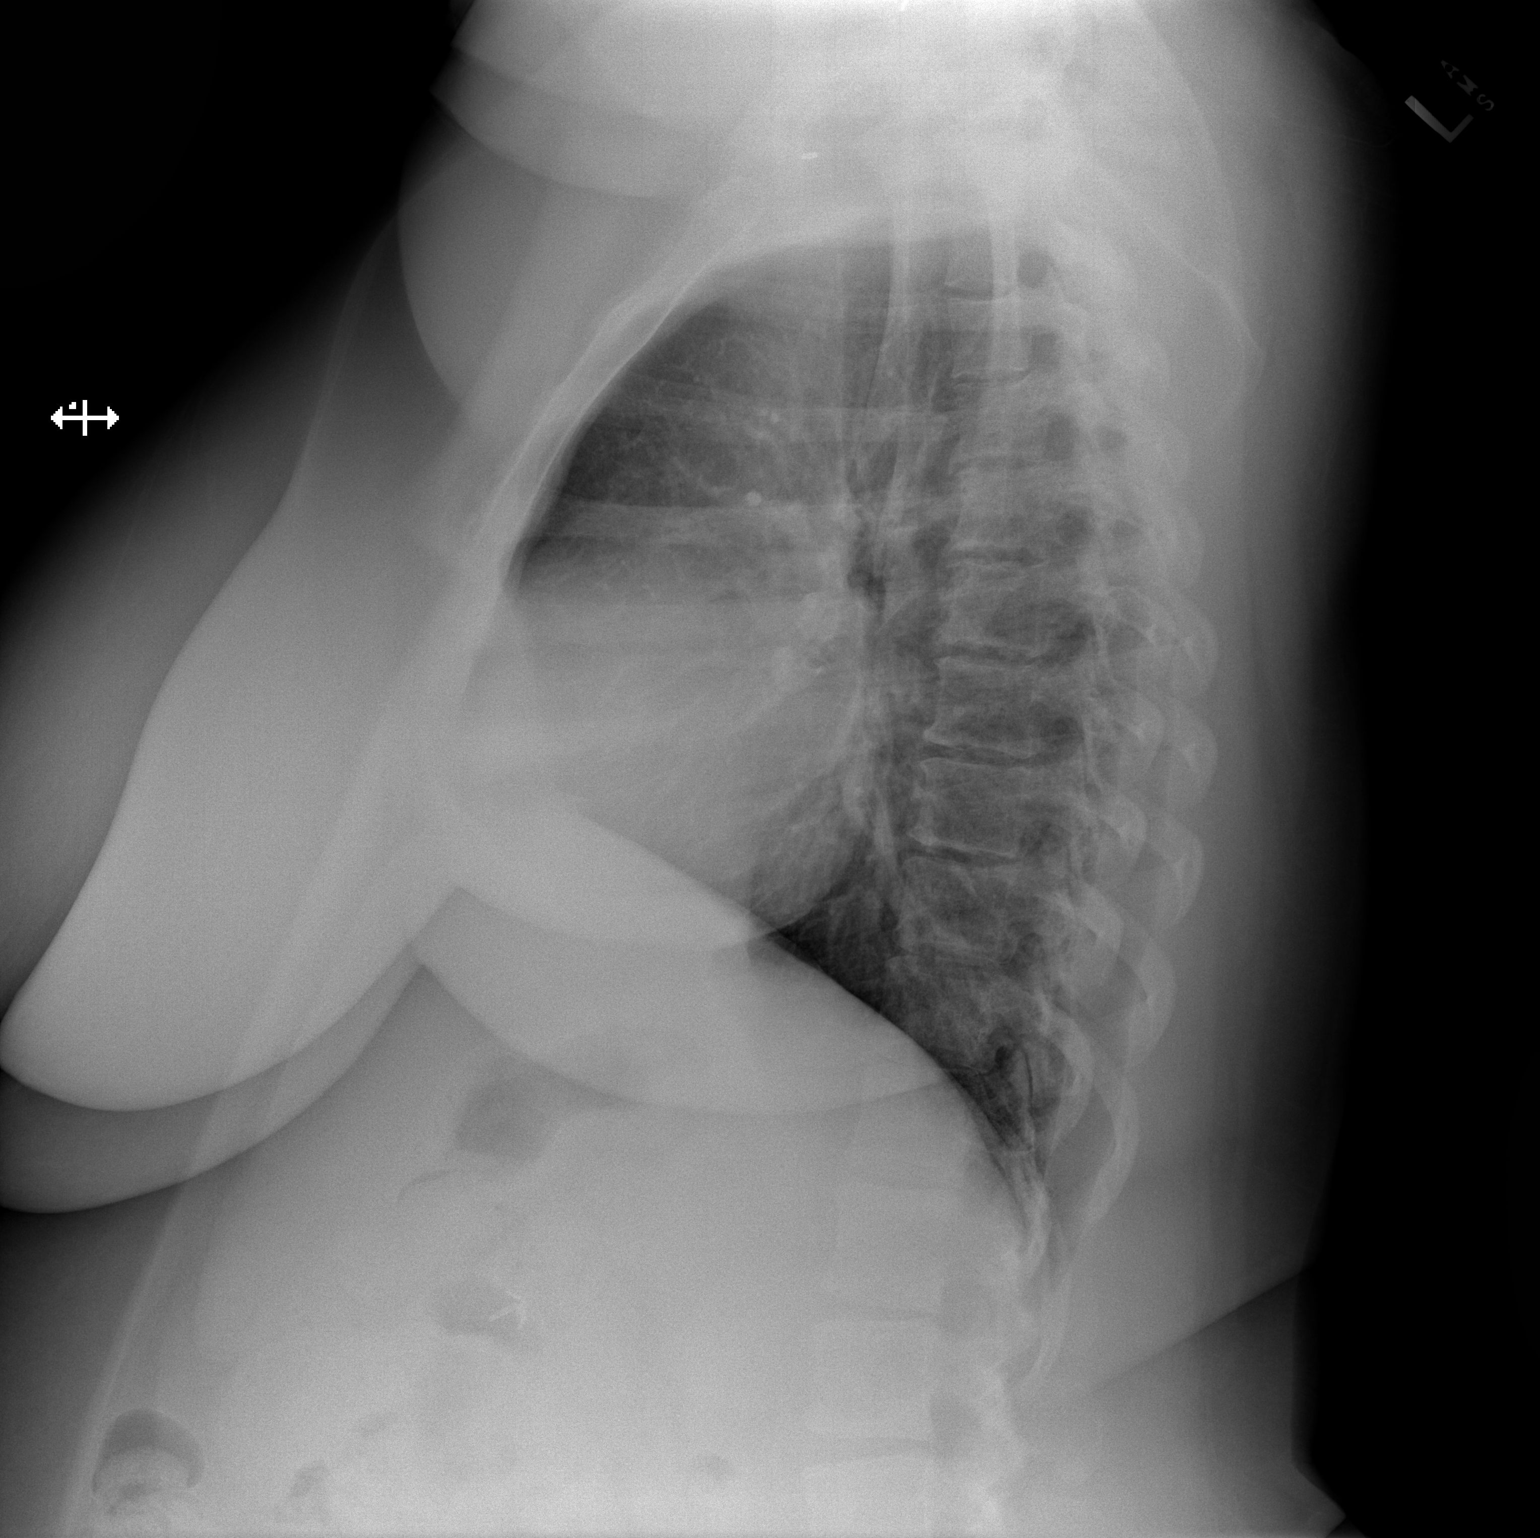

[2 of 2 positions shown; findings below may reference images not displayed]

FINDINGS: Cardiac silhouette is normal in size. No mediastinal or hilar
masses. No evidence of adenopathy.

Clear lungs.  No pleural effusion or pneumothorax.

Stable changes from previous parathyroid surgery.

Skeletal structures are intact.
IMPRESSION: No active cardiopulmonary disease.

## 2019-11-07 ENCOUNTER — Telehealth: Payer: Self-pay | Admitting: Internal Medicine

## 2019-11-07 NOTE — Telephone Encounter (Signed)
Patient called today She stated she was in a few weeks ago from a boil that popped. Patient said that another one has come up in the exact same spot and she doesn't believe the first one healed properly. Patient would like to see if an antibiotic could be called him for her.  She stated she did not think it was right for her to have to come in again if it is the same exact thing she was just seen for and she did not want to waste another 30 dollars.   Please advise

## 2019-11-07 NOTE — Telephone Encounter (Signed)
We don't just give out antibiotics back to back. She needs to be evaluated.

## 2019-11-11 ENCOUNTER — Ambulatory Visit: Payer: Federal, State, Local not specified - PPO | Admitting: Cardiology

## 2019-11-12 ENCOUNTER — Telehealth: Payer: Self-pay | Admitting: Internal Medicine

## 2019-11-12 ENCOUNTER — Ambulatory Visit: Payer: Federal, State, Local not specified - PPO | Admitting: Cardiology

## 2019-11-12 MED ORDER — SULFAMETHOXAZOLE-TRIMETHOPRIM 800-160 MG PO TABS: 1.0000 | ORAL_TABLET | Freq: Two times a day (BID) | ORAL | 0 refills | Status: DC

## 2019-11-12 MED ORDER — DOXYCYCLINE HYCLATE 100 MG PO TABS: 100.0000 mg | ORAL_TABLET | Freq: Two times a day (BID) | ORAL | 0 refills | Status: DC

## 2019-11-12 NOTE — Telephone Encounter (Signed)
Per verbal order change Rx to Doxycycline 100 mg BID #14

## 2019-11-12 NOTE — Telephone Encounter (Signed)
Pt reports the area has improved, still discharge coming from boil.... denies additional redness or warmth.... there was no indication for Ax at that time... she is having difficulty with getting off work... pt would like to see if see can take Ax and if no improvement she will have to schedule an OV for reevaluation...Marland Kitchen please advise

## 2019-11-12 NOTE — Addendum Note (Signed)
Addended by: [name] on: 11/12/2019 01:38 PM   Modules accepted: Orders

## 2019-11-12 NOTE — Telephone Encounter (Signed)
Melanie, [facility], called about a possible drug interaction.  Pt prescribed generic Septra and it has contraindication with Spironolactone.

## 2019-11-12 NOTE — Telephone Encounter (Signed)
Septra sent to pharmacy

## 2019-11-20 ENCOUNTER — Encounter: Payer: Federal, State, Local not specified - PPO | Admitting: Cardiology

## 2019-11-20 ENCOUNTER — Encounter: Payer: Self-pay | Admitting: Cardiology

## 2019-11-20 ENCOUNTER — Other Ambulatory Visit: Payer: Self-pay

## 2019-11-26 NOTE — Progress Notes (Signed)
Erroneous encounter

## 2019-12-03 ENCOUNTER — Ambulatory Visit: Payer: Federal, State, Local not specified - PPO | Admitting: Cardiology

## 2019-12-03 ENCOUNTER — Encounter: Payer: Self-pay | Admitting: Cardiology

## 2019-12-03 ENCOUNTER — Other Ambulatory Visit: Payer: Self-pay

## 2019-12-03 VITALS — BP 187/115 | HR 75 | Temp 97.4°F | Resp 16 | Ht 61.0 in | Wt 223.3 lb

## 2019-12-03 DIAGNOSIS — R0600 Dyspnea, unspecified: Secondary | ICD-10-CM | POA: Diagnosis not present

## 2019-12-03 DIAGNOSIS — I10 Essential (primary) hypertension: Secondary | ICD-10-CM | POA: Diagnosis not present

## 2019-12-03 DIAGNOSIS — R0609 Other forms of dyspnea: Secondary | ICD-10-CM

## 2019-12-03 MED ORDER — HYDRALAZINE HCL 25 MG PO TABS: 25.0000 mg | ORAL_TABLET | Freq: Four times a day (QID) | ORAL | 3 refills | Status: DC | PRN

## 2019-12-03 NOTE — Progress Notes (Signed)
Primary Physician:  [name], NP   Patient ID: [name], female    DOB: [birth year], 39 y.o.   MRN: [id number]  Subjective:   HPI: Debbie Walter  is a 39 y.o. female  with hypertension, morbid obesity, polycystic ovarian syndrome, severe anxiety, and insulin resistance.  Patient present today for follow up of hypertension and weight management. Last visit in 06/2019. She has history of difficulty controlling blood pressure for last several years. Negative renal dopplers in the past. She is currently treating her blood pressure with Propranolol 160mg  QD, Spironolactone 50mg  QD, and Valsartan 320mg  QD.  She notes her blood pressures are usually not that high, the usually run in the 130's/80's. Denies any issues with the medications    Denies any chest pain, PND, orthopnea, palpitations, syncopal episodes, or sympotoms of claudication or TIA.  Denies excessive daytime sleepiness or snoring. Denies any headaches, vision changes, LE swelling.   She had DOE during last visit following COVID and viral pneumonia requiring hospitalization. She notes that this has resolved.    She is also following up on her weight management. She notes she is trying to watch what she is eating more often. Trying to limit sodas and juices and drink more water. She is playing with her children.   She just found out that her mom will be transitioning to dialysis soon and feels she will be following in her mother's footsteps which she thinks is contributing to her higher blood pressure and weight loss.  She notes she has reached out to bariatric clinic but they have not contacted her back with more information. She notes she plans on trying to contact them again.  Past Medical History:  Diagnosis Date  . Fibroids   . GERD (gastroesophageal reflux disease)    takes protonix  . History of kidney stones   . Hypertension   . MRSA infection    under arm  . Panic attacks   . PCOS (polycystic ovarian  syndrome)    takes metformin for this    Past Surgical History:  Procedure Laterality Date  . CESAREAN SECTION    . CESAREAN SECTION WITH BILATERAL TUBAL LIGATION Bilateral 05/15/2014   Procedure: CESAREAN SECTION WITH BILATERAL TUBAL LIGATION;  Surgeon: Cyril Mourning, MD;  Location: [facility];  Service: Obstetrics;  Laterality: Bilateral;  repeat  edc 06/10/14  . CHOLECYSTECTOMY N/A 10/31/2017   Procedure: LAPAROSCOPIC CHOLECYSTECTOMY;  Surgeon: Coralie Keens, MD;  Location: [facility];  Service: General;  Laterality: N/A;  . HYSTEROSCOPY    . PARATHYROIDECTOMY     REMOVED PART OF THYROID   . PARATHYROIDECTOMY      Social History   Socioeconomic History  . Marital status: Married    Spouse name: Not on file  . Number of children: 2  . Years of education: Not on file  . Highest education level: Not on file  Occupational History  . Not on file  Tobacco Use  . Smoking status: Never Smoker  . Smokeless tobacco: Never Used  Substance and Sexual Activity  . Alcohol use: No    Alcohol/week: 0.0 standard drinks  . Drug use: No  . Sexual activity: Yes    Birth control/protection: None  Other Topics Concern  . Not on file  Social History Narrative  . Not on file   Social Determinants of Health   Financial Resource Strain:   . Difficulty of Paying Living Expenses:   Food Insecurity:   .  Worried About Charity fundraiser in the Last Year:   . Arboriculturist in the Last Year:   Transportation Needs:   . Film/video editor (Medical):   Marland Kitchen Lack of Transportation (Non-Medical):   Physical Activity:   . Days of Exercise per Week:   . Minutes of Exercise per Session:   Stress:   . Feeling of Stress :   Social Connections:   . Frequency of Communication with Friends and Family:   . Frequency of Social Gatherings with Friends and Family:   . Attends Religious Services:   . Active Member of Clubs or Organizations:   . Attends Archivist Meetings:   Marland Kitchen Marital  Status:   Intimate Partner Violence:   . Fear of Current or Ex-Partner:   . Emotionally Abused:   Marland Kitchen Physically Abused:   . Sexually Abused:     Review of Systems  Constitution: Negative for decreased appetite, malaise/fatigue, weight gain and weight loss.  Eyes: Negative for visual disturbance.  Cardiovascular: Negative for chest pain, claudication, dyspnea on exertion, leg swelling, orthopnea, palpitations and syncope.  Respiratory: Negative for hemoptysis and wheezing.   Endocrine: Negative for cold intolerance and heat intolerance.  Hematologic/Lymphatic: Does not bruise/bleed easily.  Skin: Negative for nail changes.  Musculoskeletal: Negative for muscle weakness and myalgias.  Gastrointestinal: Negative for abdominal pain, change in bowel habit, nausea and vomiting.  Neurological: Negative for difficulty with concentration, dizziness, focal weakness and headaches.  Psychiatric/Behavioral: Negative for altered mental status and suicidal ideas. Depression: and anxiety.  All other systems reviewed and are negative.     Objective:  Blood pressure (!) 182/104, pulse 72, temperature (!) 97.4 F (36.3 C), temperature source Temporal, resp. rate 16, height 5\' 1"  (1.549 m), weight 223 lb 4.8 oz (101.3 kg), SpO2 97 %. Body mass index is 42.19 kg/m.     Physical Exam  Constitutional: She appears well-developed and well-nourished. No distress.  HENT:  Head: Normocephalic and atraumatic.  Eyes: Conjunctivae are normal.  Neck: No JVD present.  Abdominal: Soft. Bowel sounds are normal.  Musculoskeletal:     Cervical back: Normal range of motion and neck supple.   Radiology: No results found.  Laboratory examination:    CMP Latest Ref Rng & Units 06/14/2019 06/13/2019 06/12/2019  Glucose 70 - 99 mg/dL 101(H) 83 103(H)  BUN 6 - 20 mg/dL 18 18 19   Creatinine 0.44 - 1.00 mg/dL 0.85 0.70 0.76  Sodium 135 - 145 mmol/L 137 142 140  Potassium 3.5 - 5.1 mmol/L 4.0 3.9 3.9  Chloride 98 -  111 mmol/L 104 108 105  CO2 22 - 32 mmol/L 23 26 25   Calcium 8.9 - 10.3 mg/dL 8.3(L) 7.9(L) 8.1(L)  Total Protein 6.5 - 8.1 g/dL 6.1(L) 6.3(L) 7.1  Total Bilirubin 0.3 - 1.2 mg/dL 0.3 0.4 0.2(L)  Alkaline Phos 38 - 126 U/L 43 39 45  AST 15 - 41 U/L 25 18 23   ALT 0 - 44 U/L 39 25 23   CBC Latest Ref Rng & Units 06/13/2019 06/12/2019 06/11/2019  WBC 4.0 - 10.5 K/uL 7.9 5.5 5.2  Hemoglobin 12.0 - 15.0 g/dL 12.2 12.2 11.7(L)  Hematocrit 36.0 - 46.0 % 38.9 39.4 37.5  Platelets 150 - 400 K/uL 383 349 276   Lipid Panel     Component Value Date/Time   CHOL 121 03/26/2019 1540   TRIG 202.0 (H) 03/26/2019 1540   HDL 37.30 (L) 03/26/2019 1540   CHOLHDL 3 03/26/2019 1540  VLDL 40.4 (H) 03/26/2019 1540   LDLCALC 50 09/26/2017 1456   LDLDIRECT 74.0 03/26/2019 1540   HEMOGLOBIN A1C Lab Results  Component Value Date   HGBA1C 6.5 03/26/2019   TSH Recent Labs    03/26/19 1540  TSH 2.17    PRN Meds:. There are no discontinued medications. No outpatient medications have been marked as taking for the 12/03/19 encounter (Office Visit) with Adrian Prows, MD.    Cardiac Studies:   Echocardiogram 02/17/2016: Left ventricle cavity is normal in size. Mild concentric hypertrophy of the left ventricle. Normal global wall motion. Normal diastolic filling pattern. Calculated EF 72%. Left atrial cavity is normal in size. Aneurysmal interatrial septum with probable small incidental PFO. Trace mitral regurgitation. Trace tricuspid regurgitation. Unable to estimate PA pressure due to absence/minimal TR signal.  Exercise sestamibi stress test 02/21/2016: 1. Resting EKG demonstrates normal sinus rhythm, normal axis, nonspecific inferior and lateral ST segment depression with T-wave inversion. Cannot exclude ischemia. Stress EKG was equivocal for ischemia with PVCs noted during peak exercise but no additional ST-T wave changes of ischemia. Patient exercised on a Bruce protocol for 4 minutes and 48 seconds  and achieved 6.74 METS. Stress terminated due to fatigue. Normal blood pressure response. Low exercise tolerance for age. 2. Raw images reveal prominent breast attenuation. The perfusion imaging study demonstrates the left ventricle to be normal in both rest and stress images. There is a small sized mild ischemia in the basal anterior and anteroseptal region and basal to mid inferior wall. Left ventricular systolic function calculated QGS was mildly depressed at 47%. This is an intermediate risk study, clinical correlation recommended.  Assessment:     ICD-10-CM   1. Essential hypertension  I10 EKG 12-Lead  2. Dyspnea on exertion  R06.00     EKG 11/27/2018: Normal sinus rhythm at 70 bpm, normal axis, nonspecific ST and T wave changes in inferior leads, cannot exclude ischemia. Compared to previous EKG 05/01/2018, ST and T wave changes are more prominent in leads V5 and V6.   EKG 12/03/2019: Normal sinus rhythm at 72 bpm. Nonspecific ST and T wave changes.   Recommendations:    Patient is here for 4 month follow up for hypertension. She notes she has increased anxiety and white coat hypertension that raises her blood pressures during visits. She has history of normal blood pressures at her PCP's office. Encouraged her to get a home BP cuff to start monitoring regularly. Patient to continue Valsartan 320mg  QD, Spironolactone 50mg  QD, and Propranolol 160mg  QD. Will add on PRN Hydralazine 25mg .   Her dyspnea on exertion has resolved. No further work up at this time.   Discussed weight loss and encouraged patient to get into contact with clinic in regards to bariatric surgery. She has lost 5lbs since Jan. 2021. Encouraged her to continue to monitor diet and start regular exercise. She voiced motivation to lose weight prior to pursuing surgery.  Will plan to follow up in 6 months for weight and hypertension.   Danna Hefty, [facility]. [place] Office: [phone] Fax:  [phone]

## 2019-12-04 ENCOUNTER — Other Ambulatory Visit: Payer: Self-pay | Admitting: Cardiology

## 2019-12-12 ENCOUNTER — Telehealth: Payer: Self-pay | Admitting: Internal Medicine

## 2019-12-12 DIAGNOSIS — F430 Acute stress reaction: Secondary | ICD-10-CM

## 2019-12-12 NOTE — Telephone Encounter (Signed)
Pt calling checking to see if pt cardiology  Dr Amanda Cockayne sent note to [facility] asking to put pt back on xanax.  Pt stated hydralazine  doesn't work for her  [employer] number [phone]  Please advise  Can leave message if pt doesn't answer

## 2019-12-16 NOTE — Telephone Encounter (Signed)
Patient called back Stated she has not heard from anyone about the request for Xanax  Patient is following up on the status of this

## 2019-12-16 NOTE — Telephone Encounter (Signed)
Spoke to pt and she is aware that Rollene Fare said that she will not be filling Xanax Rx... pt expressed understanding and stated that she will call Dr Einar Gip back

## 2019-12-25 ENCOUNTER — Other Ambulatory Visit: Payer: Self-pay | Admitting: Cardiology

## 2019-12-25 ENCOUNTER — Other Ambulatory Visit: Payer: Self-pay | Admitting: Internal Medicine

## 2019-12-29 ENCOUNTER — Telehealth: Payer: Self-pay

## 2019-12-29 NOTE — Telephone Encounter (Signed)
Patient called and would like to know if you can prescribe her xanax .25. Patient states her primary will no longer fill it. Patient also states that she does not take the medication daily, just prn. Please advise. Thanks!

## 2019-12-29 NOTE — Telephone Encounter (Signed)
I do not Rx this and it is not appropriate for me to Rx when one provider has denied it. Can she personally discuss with her PCP  please. JG

## 2019-12-30 NOTE — Telephone Encounter (Signed)
Relayed information to patient. Patient voiced understanding.  

## 2020-01-04 ENCOUNTER — Other Ambulatory Visit: Payer: Self-pay | Admitting: Internal Medicine

## 2020-02-25 DIAGNOSIS — I10 Essential (primary) hypertension: Secondary | ICD-10-CM | POA: Diagnosis not present

## 2020-02-25 DIAGNOSIS — E119 Type 2 diabetes mellitus without complications: Secondary | ICD-10-CM | POA: Diagnosis not present

## 2020-02-25 DIAGNOSIS — E669 Obesity, unspecified: Secondary | ICD-10-CM | POA: Diagnosis not present

## 2020-02-25 DIAGNOSIS — Z6835 Body mass index (BMI) 35.0-35.9, adult: Secondary | ICD-10-CM | POA: Diagnosis not present

## 2020-03-05 ENCOUNTER — Telehealth: Payer: Self-pay

## 2020-03-05 NOTE — Telephone Encounter (Signed)
FMLA PPW received that is for her anxiety PRN absences... filled out and placed in your inbox to sign

## 2020-03-11 ENCOUNTER — Telehealth: Payer: Self-pay | Admitting: Internal Medicine

## 2020-03-11 NOTE — Telephone Encounter (Signed)
Received ppw via fax to be filled out for Leave of Absence. Placed on cart.

## 2020-03-11 NOTE — Telephone Encounter (Signed)
FMLA ppw work faxed, copy sent to scan and 1 for pt

## 2020-03-19 ENCOUNTER — Other Ambulatory Visit: Payer: Self-pay | Admitting: Cardiology

## 2020-03-19 ENCOUNTER — Other Ambulatory Visit: Payer: Self-pay | Admitting: Internal Medicine

## 2020-03-24 ENCOUNTER — Other Ambulatory Visit: Payer: Self-pay | Admitting: Cardiology

## 2020-03-24 ENCOUNTER — Other Ambulatory Visit: Payer: Self-pay | Admitting: Internal Medicine

## 2020-03-25 NOTE — Telephone Encounter (Signed)
This was last filled 10/08/2019 with no refills... okay to refill? Please advise

## 2020-04-04 ENCOUNTER — Other Ambulatory Visit: Payer: Self-pay | Admitting: Internal Medicine

## 2020-04-07 ENCOUNTER — Encounter: Payer: Self-pay | Admitting: Internal Medicine

## 2020-04-07 ENCOUNTER — Ambulatory Visit (INDEPENDENT_AMBULATORY_CARE_PROVIDER_SITE_OTHER): Payer: Federal, State, Local not specified - PPO | Admitting: Internal Medicine

## 2020-04-07 ENCOUNTER — Other Ambulatory Visit: Payer: Self-pay

## 2020-04-07 ENCOUNTER — Telehealth: Payer: Self-pay

## 2020-04-07 VITALS — BP 128/82 | HR 74 | Temp 98.2°F | Ht 61.5 in | Wt 182.0 lb

## 2020-04-07 DIAGNOSIS — F329 Major depressive disorder, single episode, unspecified: Secondary | ICD-10-CM

## 2020-04-07 DIAGNOSIS — I10 Essential (primary) hypertension: Secondary | ICD-10-CM

## 2020-04-07 DIAGNOSIS — F32A Depression, unspecified: Secondary | ICD-10-CM

## 2020-04-07 DIAGNOSIS — E119 Type 2 diabetes mellitus without complications: Secondary | ICD-10-CM | POA: Diagnosis not present

## 2020-04-07 DIAGNOSIS — F419 Anxiety disorder, unspecified: Secondary | ICD-10-CM | POA: Diagnosis not present

## 2020-04-07 DIAGNOSIS — Z0000 Encounter for general adult medical examination without abnormal findings: Secondary | ICD-10-CM | POA: Diagnosis not present

## 2020-04-07 DIAGNOSIS — K219 Gastro-esophageal reflux disease without esophagitis: Secondary | ICD-10-CM

## 2020-04-07 DIAGNOSIS — E042 Nontoxic multinodular goiter: Secondary | ICD-10-CM | POA: Diagnosis not present

## 2020-04-07 DIAGNOSIS — E282 Polycystic ovarian syndrome: Secondary | ICD-10-CM | POA: Diagnosis not present

## 2020-04-07 DIAGNOSIS — E7800 Pure hypercholesterolemia, unspecified: Secondary | ICD-10-CM

## 2020-04-07 MED ORDER — ALPRAZOLAM 0.25 MG PO TABS
0.2500 mg | ORAL_TABLET | Freq: Every day | ORAL | 0 refills | Status: DC | PRN
Start: 2020-04-07 — End: 2021-02-17

## 2020-04-07 NOTE — Assessment & Plan Note (Signed)
CMET and lipid profile today Encouraged her to consume a low fat diet Continue Simvastatin 

## 2020-04-07 NOTE — Assessment & Plan Note (Signed)
Continue Fluoxetine and Buspar D/c Hydralazine RX for Xanax 0.25 mg daily prn #30 (to last a whole year) Support offered today CBC, CMET, TSH and Vit D today

## 2020-04-07 NOTE — Assessment & Plan Note (Signed)
TSH today She will continue to follow with endocrinology

## 2020-04-07 NOTE — Patient Instructions (Signed)
Health Maintenance, Female Adopting a healthy lifestyle and getting preventive care are important in promoting health and wellness. Ask your health care provider about:  The right schedule for you to have regular tests and exams.  Things you can do on your own to prevent diseases and keep yourself healthy. What should I know about diet, weight, and exercise? Eat a healthy diet   Eat a diet that includes plenty of vegetables, fruits, low-fat dairy products, and lean protein.  Do not eat a lot of foods that are high in solid fats, added sugars, or sodium. Maintain a healthy weight Body mass index (BMI) is used to identify weight problems. It estimates body fat based on height and weight. Your health care provider can help determine your BMI and help you achieve or maintain a healthy weight. Get regular exercise Get regular exercise. This is one of the most important things you can do for your health. Most adults should:  Exercise for at least 150 minutes each week. The exercise should increase your heart rate and make you sweat (moderate-intensity exercise).  Do strengthening exercises at least twice a week. This is in addition to the moderate-intensity exercise.  Spend less time sitting. Even light physical activity can be beneficial. Watch cholesterol and blood lipids Have your blood tested for lipids and cholesterol at 41 years of age, then have this test every 5 years. Have your cholesterol levels checked more often if:  Your lipid or cholesterol levels are high.  You are older than 40 years of age.  You are at high risk for heart disease. What should I know about cancer screening? Depending on your health history and family history, you may need to have cancer screening at various ages. This may include screening for:  Breast cancer.  Cervical cancer.  Colorectal cancer.  Skin cancer.  Lung cancer. What should I know about heart disease, diabetes, and high blood  pressure? Blood pressure and heart disease  High blood pressure causes heart disease and increases the risk of stroke. This is more likely to develop in people who have high blood pressure readings, are of African descent, or are overweight.  Have your blood pressure checked: ? Every 3-5 years if you are 18-39 years of age. ? Every year if you are 40 years old or older. Diabetes Have regular diabetes screenings. This checks your fasting blood sugar level. Have the screening done:  Once every three years after age 40 if you are at a normal weight and have a low risk for diabetes.  More often and at a younger age if you are overweight or have a high risk for diabetes. What should I know about preventing infection? Hepatitis B If you have a higher risk for hepatitis B, you should be screened for this virus. Talk with your health care provider to find out if you are at risk for hepatitis B infection. Hepatitis C Testing is recommended for:  Everyone born from 1945 through 1965.  Anyone with known risk factors for hepatitis C. Sexually transmitted infections (STIs)  Get screened for STIs, including gonorrhea and chlamydia, if: ? You are sexually active and are younger than 41 years of age. ? You are older than 41 years of age and your health care provider tells you that you are at risk for this type of infection. ? Your sexual activity has changed since you were last screened, and you are at increased risk for chlamydia or gonorrhea. Ask your health care provider if   you are at risk.  Ask your health care provider about whether you are at high risk for HIV. Your health care provider may recommend a prescription medicine to help prevent HIV infection. If you choose to take medicine to prevent HIV, you should first get tested for HIV. You should then be tested every 3 months for as long as you are taking the medicine. Pregnancy  If you are about to stop having your period (premenopausal) and  you may become pregnant, seek counseling before you get pregnant.  Take 400 to 800 micrograms (mcg) of folic acid every day if you become pregnant.  Ask for birth control (contraception) if you want to prevent pregnancy. Osteoporosis and menopause Osteoporosis is a disease in which the bones lose minerals and strength with aging. This can result in bone fractures. If you are 65 years old or older, or if you are at risk for osteoporosis and fractures, ask your health care provider if you should:  Be screened for bone loss.  Take a calcium or vitamin D supplement to lower your risk of fractures.  Be given hormone replacement therapy (HRT) to treat symptoms of menopause. Follow these instructions at home: Lifestyle  Do not use any products that contain nicotine or tobacco, such as cigarettes, e-cigarettes, and chewing tobacco. If you need help quitting, ask your health care provider.  Do not use street drugs.  Do not share needles.  Ask your health care provider for help if you need support or information about quitting drugs. Alcohol use  Do not drink alcohol if: ? Your health care provider tells you not to drink. ? You are pregnant, may be pregnant, or are planning to become pregnant.  If you drink alcohol: ? Limit how much you use to 0-1 drink a day. ? Limit intake if you are breastfeeding.  Be aware of how much alcohol is in your drink. In the [place] one drink equals one 12 oz bottle of beer (355 mL), one 5 oz glass of wine (148 mL), or one 1 oz glass of hard liquor (44 mL). General instructions  Schedule regular health, dental, and eye exams.  Stay current with your vaccines.  Tell your health care provider if: ? You often feel depressed. ? You have ever been abused or do not feel safe at home. Summary  Adopting a healthy lifestyle and getting preventive care are important in promoting health and wellness.  Follow your health care provider's instructions about healthy  diet, exercising, and getting tested or screened for diseases.  Follow your health care provider's instructions on monitoring your cholesterol and blood pressure. This information is not intended to replace advice given to you by your health care provider. Make sure you discuss any questions you have with your health care provider. Document Revised: 08/28/2018 Document Reviewed: 08/28/2018 Elsevier Patient Education  2020 [employer]  

## 2020-04-07 NOTE — Progress Notes (Signed)
Subjective:    Patient ID: Debbie Walter, female    DOB: 1980/10/23, 39 y.o.   MRN: [id number]  HPI  Pt presents to the clinic today for her annual exam. She is also due to follow up chronic conditions.  Anxiety and Depression: Chronic, stable on Fluoxetine and Buspar. She is taking Hydralazine as needed but does not feel like it is effective. She would like to get restarted on Xanax. She is not currently seeing a therapist or psychiatrist. She denies SI/HI.  HTN: Her BP today is 128/82. She is taking Valsartan, Spironolactone, Hydralazine (as needed) and Propranolol as prescribed. She is taking Potassium supplement as well. ECG from 11/2019 reviewed. She follows with cardiology.  GERD: Triggered by tomato based and greasy foods. She denies breakthrough on Famotidine. There is no upper GI on file.   PCOS: She is taking Metformin as prescribed. She follows with endocrinology.  DM 2: Her last A1C was 6.5%, 03/2019, She is taking Metformin as prescribed. She does not routinely check her sugars but she can tell she has been having hypoclycemic episodes. She checks her feet routinely.  Thyroid Nodules: Thyroid ultrasound from 09/2015 reviewed. She follows with endocrinology.  HLD: Her last LDL was 74, 03/2019. She denies myalgias on Simvastatin. She does not consume a low fat diet.  Flu: 05/2019 Tetanus: 02/2014 Covid: 11/2019 Pap Smear: 09/2019 Mammogram: 09/2019 Vision Screening: annually Dentist: biannually  Diet: She does eat lean meat. She consumes fruits and veggies daily. She rarely eats fried foods. She drinks mostly water, ginger ale. Exercise: walking 1.5 miles 5 days per week  Review of Systems      Past Medical History:  Diagnosis Date  . Fibroids   . GERD (gastroesophageal reflux disease)    takes protonix  . History of kidney stones   . Hypertension   . MRSA infection    under arm  . Panic attacks   . PCOS (polycystic ovarian syndrome)    takes metformin for this     Current Outpatient Medications  Medication Sig Dispense Refill  . Ascorbic Acid (VITAMIN C) 1000 MG tablet Take 1,000 mg by mouth daily.    Marland Kitchen aspirin EC 81 MG tablet Take 81 mg by mouth daily.    . busPIRone (BUSPAR) 5 MG tablet TAKE 1 TABLET BY MOUTH EVERY DAY 90 tablet 0  . Chlorhexidine Gluconate 4 % SOLN Apply 1 application topically 2 (two) times a week. 473 mL 5  . cholecalciferol (VITAMIN D) 1000 UNITS tablet Take 1,000 Units by mouth daily.     . cyanocobalamin 500 MCG tablet Take 1 tablet (500 mcg total) by mouth daily.    . famotidine (PEPCID) 20 MG tablet Take 20 mg by mouth daily.     Marland Kitchen FLUoxetine (PROZAC) 10 MG tablet TAKE 1 TABLET BY MOUTH EVERY DAY 90 tablet 0  . fluticasone (FLONASE) 50 MCG/ACT nasal spray as needed.    . hydrALAZINE (APRESOLINE) 25 MG tablet Take 1 tablet (25 mg total) by mouth 4 (four) times daily as needed (BP > 140/80 mm Hg). 90 tablet 3  . hydrOXYzine (ATARAX/VISTARIL) 10 MG tablet TAKE 1 TABLET BY MOUTH EVERY DAY AS NEEDED 20 tablet 0  . medroxyPROGESTERone (PROVERA) 10 MG tablet medroxyprogesterone 10 mg tablet  TAKE 1 TABLET(S) EVERY DAY BY ORAL ROUTE AT BEDTIME FOR 10 DAYS.    Marland Kitchen metFORMIN (GLUCOPHAGE) 500 MG tablet Take 2 tablets (1,000 mg total) by mouth 2 (two) times daily with a meal. 180  tablet 3  . nitroGLYCERIN (NITROSTAT) 0.4 MG SL tablet Place 0.4 mg under the tongue every 5 (five) minutes as needed for chest pain.   1  . Omega-3 Fatty Acids (FISH OIL) 1000 MG CAPS Take 1 capsule by mouth daily.    . ondansetron (ZOFRAN ODT) 4 MG disintegrating tablet Take 1 tablet (4 mg total) by mouth every 8 (eight) hours as needed for nausea or vomiting. 20 tablet 0  . propranolol ER (INDERAL LA) 160 MG SR capsule TAKE 1 CAPSULE (160 MG TOTAL) BY MOUTH DAILY. 90 capsule 0  . simvastatin (ZOCOR) 40 MG tablet Take 0.5 tablets (20 mg total) by mouth every evening. 90 tablet 1  . spironolactone (ALDACTONE) 50 MG tablet TAKE 1 TABLET BY MOUTH EVERY DAY 90  tablet 2  . valsartan (DIOVAN) 320 MG tablet TAKE 1 TABLET BY MOUTH EVERY DAY 90 tablet 1  . zinc gluconate 50 MG tablet Take 50 mg by mouth daily.     No current facility-administered medications for this visit.    Allergies  Allergen Reactions  . Procardia [Nifedipine] Other (See Comments)    headache  . Wellbutrin [Bupropion] Other (See Comments)    "Makes my blood pressure go up - I don't ever want to take it again"  . Labetalol Other (See Comments)    "makes me feel like things are crawling through my head"  . Hydromorphone Other (See Comments)    Makes pt feel out of this world. Patient states it needs to be pushed slow, but she can take it.  . Morphine And Related     Needs to be pushed slow or pt feels like she will code  . Sudafed [Pseudoephedrine Hcl]     Heart race / dries out mucus membranes  . Zithromax [Azithromycin] Other (See Comments)    Gi upset.  Diarrhea    Family History  Problem Relation Age of Onset  . Hyperlipidemia Mother   . Heart disease Mother   . Diabetes Mother   . Hypertension Mother   . Hyperlipidemia Father   . Hypertension Father   . Cancer Father        rectal  . Hypertension Sister   . Anesthesia problems Neg Hx   . Hypotension Neg Hx   . Malignant hyperthermia Neg Hx   . Pseudochol deficiency Neg Hx     Social History   Socioeconomic History  . Marital status: Married    Spouse name: Semiyah Newgent  . Number of children: 2  . Years of education: Not on file  . Highest education level: Not on file  Occupational History  . Not on file  Tobacco Use  . Smoking status: Never Smoker  . Smokeless tobacco: Never Used  Vaping Use  . Vaping Use: Never used  Substance and Sexual Activity  . Alcohol use: No    Alcohol/week: 0.0 standard drinks  . Drug use: No  . Sexual activity: Yes    Birth control/protection: None  Other Topics Concern  . Not on file  Social History Narrative  . Not on file   Social Determinants of Health    Financial Resource Strain:   . Difficulty of Paying Living Expenses:   Food Insecurity:   . Worried About Charity fundraiser in the Last Year:   . Arboriculturist in the Last Year:   Transportation Needs:   . Film/video editor (Medical):   Marland Kitchen Lack of Transportation (Non-Medical):   Physical  Activity:   . Days of Exercise per Week:   . Minutes of Exercise per Session:   Stress:   . Feeling of Stress :   Social Connections:   . Frequency of Communication with Friends and Family:   . Frequency of Social Gatherings with Friends and Family:   . Attends Religious Services:   . Active Member of Clubs or Organizations:   . Attends Archivist Meetings:   Marland Kitchen Marital Status:   Intimate Partner Violence:   . Fear of Current or Ex-Partner:   . Emotionally Abused:   Marland Kitchen Physically Abused:   . Sexually Abused:      Constitutional: Pt reports fatigue. Denies fever, malaise, headache or abrupt weight changes.  HEENT: Denies eye pain, eye redness, ear pain, ringing in the ears, wax buildup, runny nose, nasal congestion, bloody nose, or sore throat. Respiratory: Denies difficulty breathing, shortness of breath, cough or sputum production.   Cardiovascular: Denies chest pain, chest tightness, palpitations or swelling in the hands or feet.  Gastrointestinal: Denies abdominal pain, bloating, constipation, diarrhea or blood in the stool.  GU: Denies urgency, frequency, pain with urination, burning sensation, blood in urine, odor or discharge. Musculoskeletal: Denies decrease in range of motion, difficulty with gait, muscle pain or joint pain and swelling.  Skin: Denies redness, rashes, lesions or ulcercations.  Neurological: Denies dizziness, difficulty with memory, difficulty with speech or problems with balance and coordination.  Psych: Pt has a history of anxiety. Denies depression, SI/HI.  No other specific complaints in a complete review of systems (except as listed in HPI  above).  Objective:   Physical Exam  BP 128/82   Pulse 74   Temp 98.2 F (36.8 C) (Temporal)   Ht 5' 1.5" (1.562 m)   Wt 182 lb (82.6 kg)   SpO2 98%   BMI 33.83 kg/m   Wt Readings from Last 3 Encounters:  12/03/19 223 lb 4.8 oz (101.3 kg)  11/20/19 228 lb (103.4 kg)  10/08/19 228 lb (103.4 kg)    General: Appears her stated age, obese, in NAD. Skin: Warm, dry and intact. No ulcerations noted. HEENT: Head: normal shape and size; Eyes: sclera white, no icterus, conjunctiva pink, PERRLA and EOMs intact;  Neck:  Neck supple, trachea midline. No masses, lumps or thyromegaly present.  Cardiovascular: Normal rate and rhythm. S1,S2 noted.  No murmur, rubs or gallops noted. No JVD or BLE edema.  Pulmonary/Chest: Normal effort and positive vesicular breath sounds. No respiratory distress. No wheezes, rales or ronchi noted.  Abdomen: Soft and nontender. Normal bowel sounds. No distention or masses noted. Liver, spleen and kidneys non palpable. Musculoskeletal: Strength 5/5 BUE/BLE. No difficulty with gait.  Neurological: Alert and oriented. Cranial nerves II-XII grossly intact. Coordination normal.  Psychiatric: Mood and affect normal. Behavior is normal. Judgment and thought content normal.     BMET    Component Value Date/Time   NA 137 06/14/2019 1610   K 4.0 06/14/2019 1610   CL 104 06/14/2019 1610   CO2 23 06/14/2019 1610   GLUCOSE 101 (H) 06/14/2019 1610   BUN 18 06/14/2019 1610   CREATININE 0.85 06/14/2019 1610   CREATININE 0.76 02/08/2017 1705   CALCIUM 8.3 (L) 06/14/2019 1610   GFRNONAA >60 06/14/2019 1610   GFRNONAA >89 02/08/2017 1705   GFRAA >60 06/14/2019 1610   GFRAA >89 02/08/2017 1705    Lipid Panel     Component Value Date/Time   CHOL 121 03/26/2019 1540   TRIG 202.0 (  H) 03/26/2019 1540   HDL 37.30 (L) 03/26/2019 1540   CHOLHDL 3 03/26/2019 1540   VLDL 40.4 (H) 03/26/2019 1540   LDLCALC 50 09/26/2017 1456    CBC    Component Value Date/Time   WBC  7.9 06/13/2019 0322   RBC 4.63 06/13/2019 0322   HGB 12.2 06/13/2019 0322   HCT 38.9 06/13/2019 0322   PLT 383 06/13/2019 0322   MCV 84.0 06/13/2019 0322   MCH 26.3 06/13/2019 0322   MCHC 31.4 06/13/2019 0322   RDW 14.3 06/13/2019 0322   LYMPHSABS 1.7 06/13/2019 0322   MONOABS 0.8 06/13/2019 0322   EOSABS 0.0 06/13/2019 0322   BASOSABS 0.1 06/13/2019 0322    Hgb A1C Lab Results  Component Value Date   HGBA1C 6.5 03/26/2019           Assessment & Plan:   Preventative Health Maintenance:  Encouraged her to get a flu shot in the fall Tetanus UTD Covid UTD Pap smear UTD Mammogram UTD Encouraged her to consume a balanced diet and exercise regimen Advised her to see an eye doctor and dentist annually Will check CBC, CMET, Lipid, TSH, A1C and Vit D today  RTC in 1 year, sooner if needed Webb Silversmith, NP This visit occurred during the SARS-CoV-2 public health emergency.  Safety protocols were in place, including screening questions prior to the visit, additional usage of staff PPE, and extensive cleaning of exam room while observing appropriate contact time as indicated for disinfecting solutions.

## 2020-04-07 NOTE — Assessment & Plan Note (Signed)
CMET, Lipid and A1C today Will adjust Metformin if needed based on labs Encouraged low carb diet and exercise for weight loss Encouraged routine eye exams Encouraged routine foot exams Flu and Covid UTD

## 2020-04-07 NOTE — Assessment & Plan Note (Signed)
A1C today Will adjust Metformin if needed based on labs

## 2020-04-07 NOTE — Assessment & Plan Note (Signed)
Continue Valsartan, Spironolactone and Hydralazine as prescribed Congratulated her on her weight loss CMET today

## 2020-04-07 NOTE — Assessment & Plan Note (Signed)
Continue Famotidine CBC and CMET today Encouraged continued weight loss

## 2020-04-07 NOTE — Telephone Encounter (Signed)
Records released faxed for Pap and Mamm

## 2020-04-08 ENCOUNTER — Telehealth: Payer: Self-pay

## 2020-04-08 LAB — LIPID PANEL
Cholesterol: 117 mg/dL (ref 0–200)
HDL: 36.8 mg/dL — ABNORMAL LOW (ref >39.00)
LDL Cholesterol: 50 mg/dL (ref 0–99)
NonHDL: 80.13
Total CHOL/HDL Ratio: 3
Triglycerides: 149 mg/dL (ref 0.0–149.0)
VLDL: 29.8 mg/dL (ref 0.0–40.0)

## 2020-04-08 LAB — CBC
HCT: 37.9 % (ref 36.0–46.0)
Hemoglobin: 12.6 g/dL (ref 12.0–15.0)
MCHC: 33.1 g/dL (ref 30.0–36.0)
MCV: 79.4 fl (ref 78.0–100.0)
Platelets: 348 10*3/uL (ref 150.0–400.0)
RBC: 4.77 Mil/uL (ref 3.87–5.11)
RDW: 14.4 % (ref 11.5–15.5)
WBC: 9 10*3/uL (ref 4.0–10.5)

## 2020-04-08 LAB — COMPREHENSIVE METABOLIC PANEL
ALT: 17 U/L (ref 0–35)
AST: 13 U/L (ref 0–37)
Albumin: 4.4 g/dL (ref 3.5–5.2)
Alkaline Phosphatase: 65 U/L (ref 39–117)
BUN: 16 mg/dL (ref 6–23)
CO2: 29 mEq/L (ref 19–32)
Calcium: 10.4 mg/dL (ref 8.4–10.5)
Chloride: 103 mEq/L (ref 96–112)
Creatinine, Ser: 0.84 mg/dL (ref 0.40–1.20)
GFR: 90.45 mL/min (ref >60.00)
Glucose, Bld: 99 mg/dL (ref 70–99)
Potassium: 4.6 mEq/L (ref 3.5–5.1)
Sodium: 139 mEq/L (ref 135–145)
Total Bilirubin: 0.4 mg/dL (ref 0.2–1.2)
Total Protein: 7 g/dL (ref 6.0–8.3)

## 2020-04-08 LAB — TSH: TSH: 1.14 u[IU]/mL (ref 0.35–4.50)

## 2020-04-08 LAB — VITAMIN D 25 HYDROXY (VIT D DEFICIENCY, FRACTURES): VITD: 57.99 ng/mL (ref 30.00–100.00)

## 2020-04-08 LAB — HEMOGLOBIN A1C: Hgb A1c MFr Bld: 5.7 % (ref 4.6–6.5)

## 2020-04-08 NOTE — Telephone Encounter (Signed)
Pt called for lab results because she is unable to get in her mychart. Advised pt of results and PCP note. Pt asked for results to be mailed. Mailed results. Pt verbalized understanding.

## 2020-04-28 ENCOUNTER — Telehealth: Payer: Self-pay

## 2020-04-28 DIAGNOSIS — I10 Essential (primary) hypertension: Secondary | ICD-10-CM | POA: Diagnosis not present

## 2020-04-28 DIAGNOSIS — Z6832 Body mass index (BMI) 32.0-32.9, adult: Secondary | ICD-10-CM | POA: Diagnosis not present

## 2020-04-28 DIAGNOSIS — E119 Type 2 diabetes mellitus without complications: Secondary | ICD-10-CM | POA: Diagnosis not present

## 2020-04-28 DIAGNOSIS — E669 Obesity, unspecified: Secondary | ICD-10-CM | POA: Diagnosis not present

## 2020-05-06 NOTE — Telephone Encounter (Signed)
Error

## 2020-05-19 DIAGNOSIS — Z6832 Body mass index (BMI) 32.0-32.9, adult: Secondary | ICD-10-CM | POA: Diagnosis not present

## 2020-05-19 DIAGNOSIS — Z713 Dietary counseling and surveillance: Secondary | ICD-10-CM | POA: Diagnosis not present

## 2020-05-19 DIAGNOSIS — E6609 Other obesity due to excess calories: Secondary | ICD-10-CM | POA: Diagnosis not present

## 2020-06-09 ENCOUNTER — Ambulatory Visit: Payer: Federal, State, Local not specified - PPO | Admitting: Cardiology

## 2020-06-10 ENCOUNTER — Telehealth: Payer: Self-pay

## 2020-06-10 NOTE — Telephone Encounter (Signed)
Patient called and said that she has lost 50 pounds since her last visit. She also said that she has stopped her valsartan due to dizziness. She also is having low BP readings 116/56 heart rate between 56-60. She wanted to know if she can cut her spironolactone in half . She does have an appointment with Celeste next week

## 2020-06-10 NOTE — Telephone Encounter (Signed)
Yes she can cut the rx

## 2020-06-10 NOTE — Telephone Encounter (Signed)
Pt returned call. Advised that she can cut in half. She asked about the propranolol which I do not see a mention of but she said she discussed it with you. Please call her back.

## 2020-06-10 NOTE — Telephone Encounter (Signed)
Yes

## 2020-06-11 ENCOUNTER — Telehealth: Payer: Self-pay

## 2020-06-11 NOTE — Telephone Encounter (Signed)
Sure

## 2020-06-11 NOTE — Telephone Encounter (Signed)
Relayed information to patient. Patient voiced understanding.  

## 2020-06-11 NOTE — Telephone Encounter (Signed)
Spoke to patient and she will take 50 mg of spironolactone and stop Propanolol, per patient Debbie Walter agrees to this. I updated her med list.

## 2020-06-11 NOTE — Telephone Encounter (Signed)
Patient is requesting to discontinue propranolol and continue on Spironolactone 50mg . Her propranolol is a capsule and she cannot half it.

## 2020-06-15 ENCOUNTER — Other Ambulatory Visit: Payer: Self-pay | Admitting: Cardiology

## 2020-06-15 NOTE — Progress Notes (Signed)
Primary Physician/Referring:  [name], NP  Patient ID: Debbie Walter, female    DOB: [birth year], 39 y.o.   MRN: [id number]  Chief Complaint  Patient presents with  . Hypertension  . Follow-up    6 month    HPI:    Debbie Walter  is a 39 y.o. AA female with hypertension, morbid obesity, insulin resistance, polycystic ovarian syndrome, and severe anxiety.   Presents for 6 month follow up for hypertension.  At last visit added hydralazine 25 mg as needed, but she has not taken it.  Patient called on 06/10/2020 to report she has lost 50 pounds and has been experiencing dizziness associated with blood pressure readings of about 116/56.  As result she has stopped her valsartan and taking propranolol every other day, taking 50 mg of spironolactone once daily. Her dizziness has resolved since stopping valsartan.   Patient's blood pressure elevated in office today, however she monitors her BP regularly at home and reports readings 115-120/60s regularly. Patient speed walks 30 minutes 4-5 days per week. Denies shortness of breath, chest pain, palpitations. She continues to focus on weight loss. Of note patient prefers to come off medications if she is able.  Past Medical History:  Diagnosis Date  . Fibroids   . GERD (gastroesophageal reflux disease)    takes protonix  . History of kidney stones   . Hypertension   . MRSA infection    under arm  . Panic attacks   . PCOS (polycystic ovarian syndrome)    takes metformin for this   Past Surgical History:  Procedure Laterality Date  . CESAREAN SECTION    . CESAREAN SECTION WITH BILATERAL TUBAL LIGATION Bilateral 05/15/2014   Procedure: CESAREAN SECTION WITH BILATERAL TUBAL LIGATION;  Surgeon: Cyril Mourning, MD;  Location: [facility];  Service: Obstetrics;  Laterality: Bilateral;  repeat  edc 06/10/14  . CHOLECYSTECTOMY N/A 10/31/2017   Procedure: LAPAROSCOPIC CHOLECYSTECTOMY;  Surgeon: Coralie Keens, MD;  Location: [facility];  Service:  General;  Laterality: N/A;  . HYSTEROSCOPY    . PARATHYROIDECTOMY     REMOVED PART OF THYROID   . PARATHYROIDECTOMY     Family History  Problem Relation Age of Onset  . Hyperlipidemia Mother   . Heart disease Mother   . Diabetes Mother   . Hypertension Mother   . Hyperlipidemia Father   . Hypertension Father   . Cancer Father        rectal  . Hypertension Sister   . Anesthesia problems Neg Hx   . Hypotension Neg Hx   . Malignant hyperthermia Neg Hx   . Pseudochol deficiency Neg Hx     Social History   Tobacco Use  . Smoking status: Never Smoker  . Smokeless tobacco: Never Used  Substance Use Topics  . Alcohol use: No    Alcohol/week: 0.0 standard drinks   Marital Status: Married   ROS  Review of Systems  Cardiovascular: Negative for chest pain, dyspnea on exertion, leg swelling, orthopnea, palpitations and syncope.  Respiratory: Negative for shortness of breath.   Neurological: Negative for dizziness.    Objective  Blood pressure 139/76, pulse 68, resp. rate 16, height 5' 1.5" (1.562 m), weight 174 lb (78.9 kg), SpO2 97 %.  Vitals with BMI 06/16/2020 04/07/2020 12/03/2019  Height 5' 1.5" 5' 1.5" -  Weight 174 lbs 182 lbs -  BMI 40.98 11.91 -  Systolic 478 295 621  Diastolic 76 82 308  Pulse 68 74  75     Physical Exam Vitals reviewed.  HENT:     Head: Normocephalic and atraumatic.  Cardiovascular:     Rate and Rhythm: Normal rate and regular rhythm.     Pulses: Intact distal pulses.     Heart sounds: S1 normal and S2 normal. No murmur heard.  No gallop.   Pulmonary:     Effort: Pulmonary effort is normal. No respiratory distress.     Breath sounds: No wheezing, rhonchi or rales.  Musculoskeletal:     Right lower leg: No edema.     Left lower leg: No edema.  Neurological:     Mental Status: She is alert.    Laboratory examination:   Recent Labs    04/07/20 1543  NA 139  K 4.6  CL 103  CO2 29  GLUCOSE 99  BUN 16  CREATININE 0.84  CALCIUM 10.4    CrCl cannot be calculated (Patient's most recent lab result is older than the maximum 21 days allowed.).  CMP Latest Ref Rng & Units 04/07/2020 06/14/2019 06/13/2019  Glucose 70 - 99 mg/dL 99 101(H) 83  BUN 6 - 23 mg/dL 16 18 18   Creatinine 0.40 - 1.20 mg/dL 0.84 0.85 0.70  Sodium 135 - 145 mEq/L 139 137 142  Potassium 3.5 - 5.1 mEq/L 4.6 4.0 3.9  Chloride 96 - 112 mEq/L 103 104 108  CO2 19 - 32 mEq/L 29 23 26   Calcium 8.4 - 10.5 mg/dL 10.4 8.3(L) 7.9(L)  Total Protein 6.0 - 8.3 g/dL 7.0 6.1(L) 6.3(L)  Total Bilirubin 0.2 - 1.2 mg/dL 0.4 0.3 0.4  Alkaline Phos 39 - 117 U/L 65 43 39  AST 0 - 37 U/L 13 25 18   ALT 0 - 35 U/L 17 39 25   CBC Latest Ref Rng & Units 04/07/2020 06/13/2019 06/12/2019  WBC 4.0 - 10.5 K/uL 9.0 7.9 5.5  Hemoglobin 12.0 - 15.0 g/dL 12.6 12.2 12.2  Hematocrit 36 - 46 % 37.9 38.9 39.4  Platelets 150 - 400 K/uL 348.0 383 349    Lipid Panel Recent Labs    04/07/20 1543  CHOL 117  TRIG 149.0  LDLCALC 50  VLDL 29.8  HDL 36.80*  CHOLHDL 3    HEMOGLOBIN A1C Lab Results  Component Value Date   HGBA1C 5.7 04/07/2020   TSH Recent Labs    04/07/20 1543  TSH 1.14    Medications and allergies   Allergies  Allergen Reactions  . Procardia [Nifedipine] Other (See Comments)    headache  . Wellbutrin [Bupropion] Other (See Comments)    "Makes my blood pressure go up - I don't ever want to take it again"  . Labetalol Other (See Comments)    "makes me feel like things are crawling through my head"  . Hydromorphone Other (See Comments)    Makes pt feel out of this world. Patient states it needs to be pushed slow, but she can take it.  . Morphine And Related     Needs to be pushed slow or pt feels like she will code  . Sudafed [Pseudoephedrine Hcl]     Heart race / dries out mucus membranes  . Zithromax [Azithromycin] Other (See Comments)    Gi upset.  Diarrhea     Outpatient Medications Prior to Visit  Medication Sig Dispense Refill  . ALPRAZolam  (XANAX) 0.25 MG tablet Take 1 tablet (0.25 mg total) by mouth daily as needed for anxiety. 30 tablet 0  . aspirin EC 81 MG  tablet Take 81 mg by mouth daily.    . busPIRone (BUSPAR) 5 MG tablet TAKE 1 TABLET BY MOUTH EVERY DAY 90 tablet 0  . cholecalciferol (VITAMIN D) 1000 UNITS tablet Take 1,000 Units by mouth daily.     . famotidine (PEPCID) 10 MG tablet Take 10 mg by mouth daily.    Marland Kitchen FLUoxetine (PROZAC) 10 MG tablet TAKE 1 TABLET BY MOUTH EVERY DAY 90 tablet 0  . nitroGLYCERIN (NITROSTAT) 0.4 MG SL tablet Place 0.4 mg under the tongue every 5 (five) minutes as needed for chest pain.   1  . Omega-3 Fatty Acids (FISH OIL) 1000 MG CAPS Take 1 capsule by mouth daily.    . simvastatin (ZOCOR) 40 MG tablet Take 0.5 tablets (20 mg total) by mouth every evening. 90 tablet 1  . spironolactone (ALDACTONE) 50 MG tablet TAKE 1 TABLET BY MOUTH EVERY DAY (Patient taking differently: Take 50 mg by mouth daily. ) 90 tablet 2  . Chlorhexidine Gluconate 4 % SOLN Apply 1 application topically 2 (two) times a week. 473 mL 5  . metFORMIN (GLUCOPHAGE) 500 MG tablet Take 2 tablets (1,000 mg total) by mouth 2 (two) times daily with a meal. (Patient taking differently: Take 500 mg by mouth daily. ) 180 tablet 3  . ondansetron (ZOFRAN ODT) 4 MG disintegrating tablet Take 1 tablet (4 mg total) by mouth every 8 (eight) hours as needed for nausea or vomiting. 20 tablet 0   No facility-administered medications prior to visit.     Radiology:   No results found.  Cardiac Studies:  Echocardiogram 02/17/2016: Left ventricle cavity is normal in size. Mild concentric hypertrophy of the left ventricle. Normal global wall motion. Normal diastolic filling pattern. Calculated EF 72%. Left atrial cavity is normal in size. Aneurysmal interatrial septum with probable small incidental PFO. Trace mitral regurgitation. Trace tricuspid regurgitation. Unable to estimate PA pressure due to absence/minimal TR signal.  Exercise  sestamibi stress test 02/21/2016: 1. Resting EKG demonstrates normal sinus rhythm, normal axis, nonspecific inferior and lateral ST segment depression with T-wave inversion. Cannot exclude ischemia. Stress EKG was equivocal for ischemia with PVCs noted during peak exercise but no additional ST-T wave changes of ischemia. Patient exercised on a Bruce protocol for 4 minutes and 48 seconds and achieved 6.74 METS. Stress terminated due to fatigue. Normal blood pressure response. Low exercise tolerance for age. 2. Raw images reveal prominent breast attenuation. The perfusion imaging study demonstrates the left ventricle to be normal in both rest and stress images. There is a small sized mild ischemia in the basal anterior and anteroseptal region and basal to mid inferior wall. Left ventricular systolic function calculated QGS was mildly depressed at 47%. This is an intermediate risk study, clinical correlation recommended.  EKG EKG 06/16/2020: Normal sinus rhythm at a rate of 65 bpm, normal axis. IVCD. Nonspecific T wave abnormalities, cannot exclude ischemia. Low voltage complexes anterolateral leads. Compared to EKG 12/03/2019, new low voltage complexes.   EKG 12/03/2019: Normal sinus rhythm with rate of 72 bpm, normal axis, IVCD.  No evidence of ischemia.  Otherwise normal EKG.    Assessment     ICD-10-CM   1. Essential hypertension  I10 EKG 12-Lead    propranolol ER (INDERAL LA) 160 MG SR capsule  2. Morbid obesity ([place])  E66.01   3. Hypercholesteremia  E78.00      Medications Discontinued During This Encounter  Medication Reason  . metFORMIN (GLUCOPHAGE) 500 MG tablet No longer needed (for PRN  medications)  . ondansetron (ZOFRAN ODT) 4 MG disintegrating tablet Patient Preference  . Chlorhexidine Gluconate 4 % SOLN Patient Preference    Meds ordered this encounter  Medications  . propranolol ER (INDERAL LA) 160 MG SR capsule    Sig: Take 1 capsule (160 mg total) by mouth every other  day.    Dispense:  30 capsule    Refill:  3    Recommendations:   Debbie Walter is a 39 y.o. AA female with hypertension, morbid obesity, insulin resistance, polycystic ovarian syndrome, and severe anxiety.  Patient presents for 32-month follow-up of hypertension.  She states she is doing well and continues to lose weight.  Since her last office visit in March 2021 she has lost 50 pounds.  Her blood pressure is now well controlled, she is currently taking Propranolol ER 160 mg every other day, spironolactone 50 mg daily.  Patient would prefer to stop taking propranolol altogether, however would like to see more extensive blood pressure log from home readings prior to stopping medication.  Discussed this with patient, she agrees to monitor her blood pressure daily for the next 2 weeks, making note of which day she takes propranolol and which days she does not.  She will send or bring this information to our office in about 2 weeks.  At this time will consider stopping propranolol if blood pressure remains well controlled on spironolactone alone.    Lipids are well controlled, and last A1c in July 2021 was 5.7.  Patient notes her diet has significantly improved and she continues to work on weight loss.  Discussed at length the importance of continued weight loss, healthy diet, regular exercise.  Encouraged her To continue daily exercise.  Will continue simvastatin 20 mg daily, she is tolerating this well.  Of note patient is currently taking aspirin 81 mg daily although it is unclear why, as she does not have history of coronary artery disease, peripheral artery disease, CVA/TIA.  Patient currently prefers to be on the aspirin, however could consider stopping this in the future. EKG today was unchanged with the exception of low voltage complexes, which in stable clinical context is likely due to obesity. Will repeat EKG at next visit.   Follow up in 3 months for hypertension and weight management. Patient  will report blood pressure readings in 2 weeks.   During this visit I reviewed and updated: Tobacco history  allergies medication reconciliation  medical history  surgical history  family history  social history.  This note was created using a voice recognition software as a result there may be grammatical errors inadvertently enclosed that do not reflect the nature of this encounter. Every attempt is made to correct such errors.   Alethia Berthold, PA-C 06/16/2020, 5:17 PM Office: [phone]

## 2020-06-16 ENCOUNTER — Other Ambulatory Visit: Payer: Self-pay

## 2020-06-16 ENCOUNTER — Encounter: Payer: Self-pay | Admitting: Student

## 2020-06-16 ENCOUNTER — Ambulatory Visit: Payer: Federal, State, Local not specified - PPO | Admitting: Student

## 2020-06-16 VITALS — BP 139/76 | HR 68 | Resp 16 | Ht 61.5 in | Wt 174.0 lb

## 2020-06-16 DIAGNOSIS — I10 Essential (primary) hypertension: Secondary | ICD-10-CM

## 2020-06-16 DIAGNOSIS — E7800 Pure hypercholesterolemia, unspecified: Secondary | ICD-10-CM | POA: Diagnosis not present

## 2020-06-16 MED ORDER — PROPRANOLOL HCL ER 160 MG PO CP24: 160.0000 mg | ORAL_CAPSULE | ORAL | 3 refills | Status: DC

## 2020-06-16 NOTE — Patient Instructions (Addendum)
Send BP log to office in 1 week. [facility] diet

## 2020-06-29 ENCOUNTER — Other Ambulatory Visit: Payer: Self-pay | Admitting: Internal Medicine

## 2020-06-29 NOTE — Telephone Encounter (Signed)
Patient called in checking on status of this and asking for multiple refills. Please advise.

## 2020-07-03 ENCOUNTER — Other Ambulatory Visit: Payer: Self-pay | Admitting: Cardiology

## 2020-07-15 ENCOUNTER — Other Ambulatory Visit: Payer: Self-pay | Admitting: Cardiology

## 2020-09-08 ENCOUNTER — Ambulatory Visit: Payer: Federal, State, Local not specified - PPO | Admitting: Student

## 2020-09-16 ENCOUNTER — Telehealth: Payer: Self-pay | Admitting: Internal Medicine

## 2020-09-16 NOTE — Telephone Encounter (Signed)
Done, given back to Alice 

## 2020-09-16 NOTE — Telephone Encounter (Signed)
Called patient to discuss FMLA paperwork received. Stated will be repeat of last year. Filled and given to PCP to sign and review.

## 2020-09-16 NOTE — Telephone Encounter (Signed)
Paperwork has been faxed over. Notified patient ready for pick up.   Copy for scan Copy for patient

## 2020-09-22 ENCOUNTER — Ambulatory Visit: Payer: Federal, State, Local not specified - PPO | Admitting: Student

## 2020-09-29 ENCOUNTER — Ambulatory Visit: Payer: Federal, State, Local not specified - PPO | Admitting: Student

## 2020-10-08 ENCOUNTER — Other Ambulatory Visit: Payer: Self-pay | Admitting: Internal Medicine

## 2020-10-08 ENCOUNTER — Other Ambulatory Visit: Payer: Self-pay | Admitting: Student

## 2020-10-08 DIAGNOSIS — I10 Essential (primary) hypertension: Secondary | ICD-10-CM

## 2020-10-12 NOTE — Progress Notes (Signed)
Primary Physician/Referring:  [name], NP  Patient ID: Debbie Walter, female    DOB: Oct 04, 1980, 40 y.o.   MRN: [id number]  Chief Complaint  Patient presents with   Hypertension   Follow-up   HPI:    Debbie Walter  is a 40 y.o. AA female with hypertension, morbid obesity, insulin resistance, polycystic ovarian syndrome, and severe anxiety.   Patient was previously on hydralazine and valsartan, but with weight loss her blood pressure control improved and she has since stopped taking both hydralazine and valsartan.   Patient presents for 4 month follow up of hypertension and weight management. Patient has continued with strict diet and lifestyle modifications and has lost additional 14 lb since last visit. He blood pressure is under excellent control per written log of hoe readings and her resting heart rate at home is 50-65 bpm. She is presently working toward goal weight of 150 lb. Patient denies chest pain, palpitations, leg swelling, dizziness, dyspnea, syncope, near-syncope.   Past Medical History:  Diagnosis Date   Fibroids    GERD (gastroesophageal reflux disease)    takes protonix   History of kidney stones    Hypertension    MRSA infection    under arm   Panic attacks    PCOS (polycystic ovarian syndrome)    takes metformin for this   Past Surgical History:  Procedure Laterality Date   CESAREAN SECTION     CESAREAN SECTION WITH BILATERAL TUBAL LIGATION Bilateral 05/15/2014   Procedure: CESAREAN SECTION WITH BILATERAL TUBAL LIGATION;  Surgeon: Cyril Mourning, MD;  Location: [facility];  Service: Obstetrics;  Laterality: Bilateral;  repeat  edc 06/10/14   CHOLECYSTECTOMY N/A 10/31/2017   Procedure: LAPAROSCOPIC CHOLECYSTECTOMY;  Surgeon: Coralie Keens, MD;  Location: [facility];  Service: General;  Laterality: N/A;   HYSTEROSCOPY     PARATHYROIDECTOMY     REMOVED PART OF THYROID    PARATHYROIDECTOMY     Family History  Problem Relation Age of  Onset   Hyperlipidemia Mother    Heart disease Mother    Diabetes Mother    Hypertension Mother    Hyperlipidemia Father    Hypertension Father    Cancer Father        rectal   Hypertension Sister    Anesthesia problems Neg Hx    Hypotension Neg Hx    Malignant hyperthermia Neg Hx    Pseudochol deficiency Neg Hx     Social History   Tobacco Use   Smoking status: Never Smoker   Smokeless tobacco: Never Used  Substance Use Topics   Alcohol use: No    Alcohol/week: 0.0 standard drinks   Marital Status: Married   ROS  Review of Systems  Constitutional: Negative for malaise/fatigue and weight gain.  Cardiovascular: Negative for chest pain, claudication, dyspnea on exertion, leg swelling, near-syncope, orthopnea, palpitations, paroxysmal nocturnal dyspnea and syncope.  Respiratory: Negative for shortness of breath.   Hematologic/Lymphatic: Does not bruise/bleed easily.  Gastrointestinal: Negative for melena.  Neurological: Negative for dizziness and weakness.    Objective  Blood pressure 126/84, pulse 63, temperature 97.7 F (36.5 C), temperature source Temporal, resp. rate 16, height 5\' 1"  (1.549 m), weight 157 lb 12.8 oz (71.6 kg), SpO2 100 %.  Vitals with BMI 10/13/2020 06/16/2020 04/07/2020  Height 5\' 1"  5' 1.5" 5' 1.5"  Weight 157 lbs 13 oz 174 lbs 182 lbs  BMI 29.83 [id number]  Systolic [id number]  Diastolic 84 76 82  Pulse 63 68 74     Physical Exam Vitals reviewed.  HENT:     Head: Normocephalic and atraumatic.  Cardiovascular:     Rate and Rhythm: Normal rate and regular rhythm.     Pulses: Intact distal pulses.     Heart sounds: S1 normal and S2 normal. No murmur heard. No gallop.   Pulmonary:     Effort: Pulmonary effort is normal. No respiratory distress.     Breath sounds: No wheezing, rhonchi or rales.  Musculoskeletal:     Right lower leg: No edema.     Left lower leg: No edema.  Skin:    General: Skin is warm and dry.      Capillary Refill: Capillary refill takes less than 2 seconds.  Neurological:     Mental Status: She is alert.    Laboratory examination:   Recent Labs    04/07/20 1543  NA 139  K 4.6  CL 103  CO2 29  GLUCOSE 99  BUN 16  CREATININE 0.84  CALCIUM 10.4   CrCl cannot be calculated (Patient's most recent lab result is older than the maximum 21 days allowed.).  CMP Latest Ref Rng & Units 04/07/2020 06/14/2019 06/13/2019  Glucose 70 - 99 mg/dL 99 101(H) 83  BUN 6 - 23 mg/dL 16 18 18   Creatinine 0.40 - 1.20 mg/dL 0.84 0.85 0.70  Sodium 135 - 145 mEq/L 139 137 142  Potassium 3.5 - 5.1 mEq/L 4.6 4.0 3.9  Chloride 96 - 112 mEq/L 103 104 108  CO2 19 - 32 mEq/L 29 23 26   Calcium 8.4 - 10.5 mg/dL 10.4 8.3(L) 7.9(L)  Total Protein 6.0 - 8.3 g/dL 7.0 6.1(L) 6.3(L)  Total Bilirubin 0.2 - 1.2 mg/dL 0.4 0.3 0.4  Alkaline Phos 39 - 117 U/L 65 43 39  AST 0 - 37 U/L 13 25 18   ALT 0 - 35 U/L 17 39 25   CBC Latest Ref Rng & Units 04/07/2020 06/13/2019 06/12/2019  WBC 4.0 - 10.5 K/uL 9.0 7.9 5.5  Hemoglobin 12.0 - 15.0 g/dL 12.6 12.2 12.2  Hematocrit 36.0 - 46.0 % 37.9 38.9 39.4  Platelets 150.0 - 400.0 K/uL 348.0 383 349    Lipid Panel Recent Labs    04/07/20 1543  CHOL 117  TRIG 149.0  LDLCALC 50  VLDL 29.8  HDL 36.80*  CHOLHDL 3    HEMOGLOBIN A1C Lab Results  Component Value Date   HGBA1C 5.7 04/07/2020   TSH Recent Labs    04/07/20 1543  TSH 1.14    Medications and allergies   Allergies  Allergen Reactions   Procardia [Nifedipine] Other (See Comments)    headache   Wellbutrin [Bupropion] Other (See Comments)    "Makes my blood pressure go up - I don't ever want to take it again"   Labetalol Other (See Comments)    "makes me feel like things are crawling through my head"   Hydromorphone Other (See Comments)    Makes pt feel out of this world. Patient states it needs to be pushed slow, but she can take it.   Morphine And Related     Needs to be pushed slow or pt  feels like she will code   Sudafed [Pseudoephedrine Hcl]     Heart race / dries out mucus membranes   Zithromax [Azithromycin] Other (See Comments)    Gi upset.  Diarrhea     Outpatient Medications Prior to Visit  Medication Sig Dispense Refill   ALPRAZolam (  XANAX) 0.25 MG tablet Take 1 tablet (0.25 mg total) by mouth daily as needed for anxiety. 30 tablet 0   APPLE CIDER VINEGAR PO Take by mouth.     ascorbic acid (VITAMIN C) 100 MG tablet Take by mouth.     aspirin EC 81 MG tablet Take 81 mg by mouth daily.     Biotin 2500 MCG CAPS Take by mouth.     busPIRone (BUSPAR) 5 MG tablet TAKE 1 TABLET BY MOUTH EVERY DAY 90 tablet 1   cholecalciferol (VITAMIN D) 1000 UNITS tablet Take 1,000 Units by mouth daily.      docusate sodium (COLACE) 100 MG capsule Take 100 mg by mouth 2 (two) times daily.     famotidine (PEPCID) 10 MG tablet Take 10 mg by mouth daily.     FLUoxetine (PROZAC) 10 MG tablet TAKE 1 TABLET BY MOUTH EVERY DAY 90 tablet 1   nitroGLYCERIN (NITROSTAT) 0.4 MG SL tablet Place 0.4 mg under the tongue every 5 (five) minutes as needed for chest pain.   1   Omega-3 Fatty Acids (FISH OIL) 1000 MG CAPS Take 1 capsule by mouth daily.     propranolol ER (INDERAL LA) 160 MG SR capsule TAKE 1 CAPSULE BY MOUTH EVERY OTHER DAY 45 capsule 2   simvastatin (ZOCOR) 40 MG tablet TAKE 1/2 TABLET BY MOUTH EVERY EVENING. 45 tablet 3   spironolactone (ALDACTONE) 50 MG tablet TAKE 1 TABLET BY MOUTH EVERY DAY 90 tablet 2   Zinc 50 MG TABS Take by mouth.     No facility-administered medications prior to visit.     Radiology:   No results found.  Cardiac Studies:   Echocardiogram 02/17/2016: Left ventricle cavity is normal in size. Mild concentric hypertrophy of the left ventricle. Normal global wall motion. Normal diastolic filling pattern. Calculated EF 72%. Left atrial cavity is normal in size. Aneurysmal interatrial septum with probable small incidental PFO. Trace mitral  regurgitation. Trace tricuspid regurgitation. Unable to estimate PA pressure due to absence/minimal TR signal.  Exercise sestamibi stress test 02/21/2016: 1. Resting EKG demonstrates normal sinus rhythm, normal axis, nonspecific inferior and lateral ST segment depression with T-wave inversion. Cannot exclude ischemia. Stress EKG was equivocal for ischemia with PVCs noted during peak exercise but no additional ST-T wave changes of ischemia. Patient exercised on a Bruce protocol for 4 minutes and 48 seconds and achieved 6.74 METS. Stress terminated due to fatigue. Normal blood pressure response. Low exercise tolerance for age. 2. Raw images reveal prominent breast attenuation. The perfusion imaging study demonstrates the left ventricle to be normal in both rest and stress images. There is a small sized mild ischemia in the basal anterior and anteroseptal region and basal to mid inferior wall. Left ventricular systolic function calculated QGS was mildly depressed at 47%. This is an intermediate risk study, clinical correlation recommended.   EKG   EKG 10/13/2020: Sinus rhythm at a rate of 62 bpm.  Normal axis.  Poor R wave progression, cannot exclude anteroseptal infarct old.  Nonspecific T wave abnormality.  Low voltage complexes. Compared to EKG 06/16/2020, no significant change.   EKG 12/03/2019: Normal sinus rhythm with rate of 72 bpm, normal axis, IVCD.  No evidence of ischemia.  Otherwise normal EKG.    Assessment     ICD-10-CM   1. Essential hypertension  I10   2. Morbid obesity ([place])  E66.01   3. Abnormal EKG  R94.31 EKG 12-Lead     There are no discontinued medications.  No orders of the defined types were placed in this encounter.   Recommendations:   Debbie Walter is a 39 y.o. AA female with hypertension, morbid obesity, insulin resistance, polycystic ovarian syndrome, and severe anxiety.  Patient presents for 68-month follow-up of hypertension and weight management.  She  has lost an additional 14 pounds since her last visit, patient is congratulated and encouraged to continue weight loss with diet and lifestyle modifications.  Blood pressure remains under excellent control. .  Presently patient is taking propranolol every other day, however blood pressure remains well controlled on days when she is just taking spironolactone alone.  Therefore patient will discontinue propranolol and continue to monitor her blood pressure daily.  Patient will notify the office if blood pressure increases to >130/80 mmHg with discontinuation of propranolol.  If blood pressure rises, can consider restarting propranolol if needed.  In regard to hyperlipidemia, patient's lipids are now well controlled, will continue simvastatin.  Discussed with patient regarding aspirin 81 mg daily, she does have a history of abnormal stress test with nonspecific inferior and lateral ST segment depression and T wave inversion as well as small size mild ischemia on perfusion imaging.  Shared decision was for patient to continue aspirin 81 mg daily. Of note EKG today was unchanged from previous, suspect low voltage complexes to be related to obesity.   Follow up in 1 year, sooner if needed, for hypertension, weight management.    Alethia Berthold, PA-C 10/14/2020, 8:07 PM Office: [phone]

## 2020-10-13 ENCOUNTER — Ambulatory Visit: Payer: Federal, State, Local not specified - PPO | Admitting: Student

## 2020-10-13 ENCOUNTER — Encounter: Payer: Self-pay | Admitting: Student

## 2020-10-13 ENCOUNTER — Other Ambulatory Visit: Payer: Self-pay

## 2020-10-13 VITALS — BP 126/84 | HR 63 | Temp 97.7°F | Resp 16 | Ht 61.0 in | Wt 157.8 lb

## 2020-10-13 DIAGNOSIS — R9431 Abnormal electrocardiogram [ECG] [EKG]: Secondary | ICD-10-CM

## 2020-10-13 DIAGNOSIS — I10 Essential (primary) hypertension: Secondary | ICD-10-CM | POA: Diagnosis not present

## 2020-11-03 ENCOUNTER — Telehealth: Payer: Self-pay | Admitting: Student

## 2020-11-03 NOTE — Telephone Encounter (Signed)
Spoke to patient, when she came off propranolol she began having mild tremors in her hands bilaterally.  Therefore she went back on propranolol 160 mg daily.  Blood pressure remained stable and she denies episodes of dizziness or fatigue.  Patient will notify our office if she experiences symptomatic hypotension.  At this time we will continue current medications.  If patient develops symptoms of hypotension could consider reducing spironolactone as patient has lost significant amount of weight over the last 1 year.

## 2020-11-03 NOTE — Telephone Encounter (Signed)
-----   Message from Alethia Berthold, [place] sent at 10/13/2020  4:31 PM EST ----- Call about BP since coming off propranolol.

## 2020-12-01 NOTE — Telephone Encounter (Signed)
Pt stated that the phone number [phone]

## 2020-12-01 NOTE — Telephone Encounter (Signed)
Pt called in wanted to know about getting her FMLA sent to her job when they receive the fax its blank, she was told that they have it and now they dont, and she has been going back and forth 5 times already, and she stated that she will be here around 2-5 and if they have it can it be mailed to her and faxed to them and she is going to pick up a copy. And she wanted to get her to call when its done.

## 2020-12-02 NOTE — Telephone Encounter (Signed)
I called and got clarification from patient. I refaxed the paperwork. I attempted to reach [facility] to verify they received the paperwork and they would not give me information without the patient present. Patient came to pick up paperwork at 4:40 yesterday afternoon and she called while she was at office to verify they received the paperwork. They confirmed they did receive this and it was not blank.

## 2020-12-14 ENCOUNTER — Telehealth: Payer: Self-pay | Admitting: Internal Medicine

## 2020-12-14 NOTE — Telephone Encounter (Signed)
Debbie Walter CALLED IN AND STATED THAT HER FLMA PAPERWORK WAS NEVER RECEIVED AFTER BEING TOLD THAT THEY RECEIVED IT, SHE STATED THAT THEY ARE TRYING TO FIRE HER.  YOU ALL FAXED IT ON 3/16 AND THE GUY TOLD YOU ALL THEY HAD IT. THE NUMBER  [phone] [id number](EMPLOYEE ID)

## 2020-12-14 NOTE — Telephone Encounter (Signed)
She states that her employer got the paperwork. EM

## 2020-12-14 NOTE — Telephone Encounter (Signed)
SHE GETS OFF AT 12 SHE EMILY NEEDS TO CALL HER.

## 2020-12-14 NOTE — Telephone Encounter (Signed)
Lvm asking pt to call office

## 2020-12-20 ENCOUNTER — Telehealth: Payer: Self-pay | Admitting: Internal Medicine

## 2020-12-20 NOTE — Telephone Encounter (Signed)
Patient called back  With the fmla people. They are resending the original paperwork we need to correct the dates to the following: 09/25/20 start date 03/25/21 end date  Return date 03/26/21

## 2020-12-20 NOTE — Telephone Encounter (Signed)
Error

## 2020-12-21 ENCOUNTER — Telehealth: Payer: Self-pay

## 2020-12-21 NOTE — Telephone Encounter (Signed)
Notified pt FMLA paperwork has been completed and faxed  Copy sent to pt in the mail  Copy for scan  Copy retained by me

## 2020-12-21 NOTE — Telephone Encounter (Signed)
Spoke w pt and FMLA case rep  Updated dates on paperwork and completed forms w employee ID # and case #  Forms filled out and ready for review, sign and date  Placed in PCP's inbox

## 2020-12-21 NOTE — Telephone Encounter (Signed)
Done, given back to [facility]

## 2020-12-24 ENCOUNTER — Other Ambulatory Visit: Payer: Self-pay | Admitting: Internal Medicine

## 2021-01-07 DIAGNOSIS — Z7689 Persons encountering health services in other specified circumstances: Secondary | ICD-10-CM | POA: Diagnosis not present

## 2021-01-07 DIAGNOSIS — Z1231 Encounter for screening mammogram for malignant neoplasm of breast: Secondary | ICD-10-CM | POA: Diagnosis not present

## 2021-02-15 NOTE — Telephone Encounter (Addendum)
Debbie Walter called in and stated that she is having problems with her FMLA and the forms needed and need to make a copy they are needing it from Sep 26 2020- March 26, 2021 , July9th 2022- 09/25/2021. And they are trying to deny her.  And call after 2pm

## 2021-02-16 NOTE — Telephone Encounter (Signed)
Pt called back.  She stated new paperwork is being sent.  I got the dates from her that she would like used, there will be 2 forms sent, for 2 time periods  I told pt Webb Silversmith is no longer here and I would have my office manager handle the paperwork   She mentioned getting a lawyer and I spoke to Donzetta Matters about this  Raquel Sarna will be taking over this pts paperwork

## 2021-02-16 NOTE — Telephone Encounter (Signed)
Pt called in. She stated she was very frustrated that she was having so much difficulty getting her paperwork. I apologized for her frustration. I did explain to her that we will need to make an appointment in order for one of the providers to sign off on her FMLA as she has not been seen in our office since July 2021. She stated she did not understand why she needed an appointment since this is paperwork from when Webb Silversmith was still in our office. She asked if Rollene Fare couldn't still sign it. I advised her that unfortunately, since Bea[place]ieu transferred to a different office and the patient is not transferring with her, she is unable to sign off on it. I scheduled her to see Dr. Lorelei Pont on 6/2 at 3:40 as she needs this completed and turned in by 6/6. I advised her that we have not received the new forms to be filled out and that she will need to have them fax these to the office. Patient verbalized understanding.

## 2021-02-16 NOTE — Telephone Encounter (Signed)
LVM for pt to call me  Will need new forms sent to office fill out

## 2021-02-17 ENCOUNTER — Encounter: Payer: Self-pay | Admitting: Family Medicine

## 2021-02-17 ENCOUNTER — Other Ambulatory Visit: Payer: Self-pay

## 2021-02-17 ENCOUNTER — Ambulatory Visit: Payer: Federal, State, Local not specified - PPO | Admitting: Family Medicine

## 2021-02-17 VITALS — BP 132/86 | HR 78 | Temp 97.4°F | Ht 61.0 in | Wt 168.0 lb

## 2021-02-17 DIAGNOSIS — F411 Generalized anxiety disorder: Secondary | ICD-10-CM | POA: Diagnosis not present

## 2021-02-17 DIAGNOSIS — F410 Panic disorder [episodic paroxysmal anxiety] without agoraphobia: Secondary | ICD-10-CM | POA: Diagnosis not present

## 2021-02-17 MED ORDER — FLUOXETINE HCL 20 MG PO CAPS: 20.0000 mg | ORAL_CAPSULE | Freq: Every day | ORAL | 3 refills | Status: DC

## 2021-02-17 MED ORDER — ALPRAZOLAM 0.25 MG PO TABS: 0.2500 mg | ORAL_TABLET | Freq: Every day | ORAL | 0 refills | Status: DC | PRN

## 2021-02-17 NOTE — Patient Instructions (Addendum)
Increase your Prozac dose:  Take every other day, 20 mg then 10 mg the next.  Do that for 2 weeks, then stay with the 20 mg tablets every day.

## 2021-02-17 NOTE — Progress Notes (Signed)
Debbie Walter T. Debbie Tobler, MD, [place] at [facility] [name], [address]  Phone: [phone]  FAX: [place] - 40 y.o. female  MRN [id number]  Date of Birth: May 20, 1981  Date: 02/17/2021  PCP: [name], NP  Referral: [name], NP  Chief Complaint  Patient presents with  . FLMA paperwork    This visit occurred during the SARS-CoV-2 public health emergency.  Safety protocols were in place, including screening questions prior to the visit, additional usage of staff PPE, and extensive cleaning of exam room while observing appropriate contact time as indicated for disinfecting solutions.   Subjective:   Debbie Walter is a 40 y.o. very pleasant female patient with Body mass index is 31.74 kg/m. who presents with the following:  Panic attacks and general anxiety:  Started when she was [age].  Had a parathyroidectomy. Started have a hyperventilated, and she started to feel some anxiety and panic attacks around this time.   At some level, she feels like she has some anxiety level basically all of the time  Will still have some panic attacks.   Sometimes will feel like she was drowning.   For her anxiety and panic attacks: Vistaril did not help. Xanax: 30 d for a year.   Prozac 10 mg and buspar 5 mg.  She takes her BuSpar only once a day. She uses about 30 of Xanax 0.25 mg tablets in a year, usually much less than this.  When she feels a panic attack, she will take 1, and if this does not resolve then she will take 2.   Non-cancer tumor on the side of her face in the past.  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  Small children. Mom has breast cancer.  In the fall, she also started some dialysis. Hosp with Covid for a week.  Approximately 9 family members have died in recent times in the patient's life, and this has caused some more panic  attacks.  Has been on zoloft and wellbutrin in the past.   She has had indefinite intermittent FMLA for a number years, and she sometimes when she has a severe panic attack has to leave work. There was some difficulty in completing FMLA and also with some minor date issues that delayed this process for her this year  09/25/2020 - 03/25/2021. Needs for past 6 months.  Needs 2 different forms.  03/26/2021 - 09/24/2021.  She already gave [place] some specific dates and a phone call previously.  Review of Systems is noted in the HPI, as appropriate  Objective:   BP 132/86   Pulse 78   Temp (!) 97.4 F (36.3 C) (Temporal)   Ht 5\' 1"  (1.549 m)   Wt 168 lb (76.2 kg)   SpO2 99%   BMI 31.74 kg/m   GEN: No acute distress; alert,appropriate. PULM: Breathing comfortably in no respiratory distress PSYCH: Normally interactive.  She appears neither anxious nor depressed.  Laboratory and Imaging Data:  Assessment and Plan:     ICD-10-CM   1. Generalized anxiety disorder with panic attacks  F41.1    F41.0    For both anxiety and panic attacks, I think that she would do better with a higher dose of Prozac.  Changed to 20 mg capsule with slow titration.  At this point, I think doing this prior to altering any kind of  BuSpar dosing is most appropriate.  Her Xanax use is appropriate in my opinion and she uses it infrequently.  FMLA paperwork was entirely completed, sent to the patient's company, and I asked for her to manually pick up a paper copy as well.  Patient Instructions  Increase your Prozac dose:  Take every other day, 20 mg then 10 mg the next.  Do that for 2 weeks, then stay with the 20 mg tablets every day.    Meds ordered this encounter  Medications  . FLUoxetine (PROZAC) 20 MG capsule    Sig: Take 1 capsule (20 mg total) by mouth daily.    Dispense:  90 capsule    Refill:  3  . ALPRAZolam (XANAX) 0.25 MG tablet    Sig: Take 1 tablet (0.25 mg total) by mouth daily as needed  for anxiety.    Dispense:  30 tablet    Refill:  0   Medications Discontinued During This Encounter  Medication Reason  . ALPRAZolam (XANAX) 0.25 MG tablet Reorder  . FLUoxetine (PROZAC) 10 MG tablet    Follow-up: With her new primary care doctor  Signed,  Maud Deed. Thressa Shiffer, MD   Outpatient Encounter Medications as of 02/17/2021  Medication Sig  . FLUoxetine (PROZAC) 20 MG capsule Take 1 capsule (20 mg total) by mouth daily.  Marland Kitchen ALPRAZolam (XANAX) 0.25 MG tablet Take 1 tablet (0.25 mg total) by mouth daily as needed for anxiety.  . APPLE CIDER VINEGAR PO Take by mouth.  Marland Kitchen ascorbic acid (VITAMIN C) 100 MG tablet Take by mouth.  Marland Kitchen aspirin EC 81 MG tablet Take 81 mg by mouth daily.  . Biotin 2500 MCG CAPS Take by mouth.  . cholecalciferol (VITAMIN D) 1000 UNITS tablet Take 1,000 Units by mouth daily.   Marland Kitchen docusate sodium (COLACE) 100 MG capsule Take 100 mg by mouth 2 (two) times daily.  . famotidine (PEPCID) 10 MG tablet Take 10 mg by mouth daily.  . hydrocortisone 2.5 % cream hydrocortisone 2.5 % topical cream  APPLY TO AFFECTED AREA TWICE A DAY FOR 2 WEEKS  . nitroGLYCERIN (NITROSTAT) 0.4 MG SL tablet Place 0.4 mg under the tongue every 5 (five) minutes as needed for chest pain.   . Omega-3 Fatty Acids (FISH OIL) 1000 MG CAPS Take 1 capsule by mouth daily.  . propranolol ER (INDERAL LA) 160 MG SR capsule TAKE 1 CAPSULE BY MOUTH EVERY OTHER DAY  . simvastatin (ZOCOR) 40 MG tablet TAKE 1/2 TABLET BY MOUTH EVERY EVENING.  Marland Kitchen Zinc 50 MG TABS Take by mouth.  . [DISCONTINUED] ALPRAZolam (XANAX) 0.25 MG tablet Take 1 tablet (0.25 mg total) by mouth daily as needed for anxiety.  . [DISCONTINUED] busPIRone (BUSPAR) 5 MG tablet TAKE 1 TABLET BY MOUTH EVERY DAY  . [DISCONTINUED] FLUoxetine (PROZAC) 10 MG tablet TAKE 1 TABLET BY MOUTH EVERY DAY  . [DISCONTINUED] spironolactone (ALDACTONE) 50 MG tablet TAKE 1 TABLET BY MOUTH EVERY DAY   No facility-administered encounter medications on file as  of 02/17/2021.

## 2021-02-18 NOTE — Telephone Encounter (Signed)
Semi completed FMLA paperwork X2 placed in PCP's inbox for review, completion, sign and date

## 2021-02-21 ENCOUNTER — Other Ambulatory Visit: Payer: Self-pay | Admitting: Internal Medicine

## 2021-02-21 ENCOUNTER — Other Ambulatory Visit: Payer: Self-pay | Admitting: Cardiology

## 2021-02-21 NOTE — Telephone Encounter (Signed)
This has been done.

## 2021-02-21 NOTE — Telephone Encounter (Signed)
Called pt to inform her that the FMLA paperwork was completed and faxed  Copy for pt pick up  Copy for scan   Copy retained by me

## 2021-03-17 ENCOUNTER — Other Ambulatory Visit: Payer: Self-pay | Admitting: Internal Medicine

## 2021-03-22 ENCOUNTER — Telehealth: Payer: Self-pay | Admitting: Internal Medicine

## 2021-03-22 NOTE — Telephone Encounter (Signed)
Pt called in to request a prescription to be sent in as she believes she has a yeast infection. I advised her that she would need an appointment to be assessed prior to a prescription being sent in. Pt declined. She stated she couldn't get in with her OBGYN for a month, but that she doesn't "Know these doctors like that" and does not feel comfortable making appointment for this. She asked if she still needs an appointment as she has had this before and knows what it is. I reassured her that yes, we will need to schedule her for an appointment as a prescription cannot be sent in without assessment first. She said never mind and hung up.

## 2021-03-23 NOTE — Telephone Encounter (Signed)
I spoke with pt; pt plans to reestablish TOC at [facility] when have appt with female provider available. Pt said [facility] is to far for her to go and pt does not want to have pelvic exam by someone she does not know and GYN is months out to schedule appt. I explained that before TOC a female provider at [facility] could see pt and eval. Pt said she is going to try OTC med and if that does not help she will cb to schedule appt with female provider.

## 2021-04-06 DIAGNOSIS — N760 Acute vaginitis: Secondary | ICD-10-CM | POA: Diagnosis not present

## 2021-04-06 DIAGNOSIS — Z01419 Encounter for gynecological examination (general) (routine) without abnormal findings: Secondary | ICD-10-CM | POA: Diagnosis not present

## 2021-04-28 DIAGNOSIS — J329 Chronic sinusitis, unspecified: Secondary | ICD-10-CM | POA: Diagnosis not present

## 2021-06-23 ENCOUNTER — Other Ambulatory Visit: Payer: Self-pay | Admitting: Cardiology

## 2021-06-23 ENCOUNTER — Other Ambulatory Visit: Payer: Self-pay | Admitting: Family

## 2021-06-29 ENCOUNTER — Other Ambulatory Visit: Payer: Self-pay | Admitting: Internal Medicine

## 2021-06-29 NOTE — Telephone Encounter (Signed)
Name of Medication: Buspar 5 mg [facility] of [facility] [place] or Written Date and Quantity: # 90 on 02/21/2021 Last Office Visit and Type: 02/17/21 FMLA/anxiety with Dr Lorelei Pont and  04/07/20 annual exam Next Office Visit and Type: none scheduled  Per note pt has 2 pills left. Sending note to [place].

## 2021-06-29 NOTE — Telephone Encounter (Signed)
  Encourage patient to contact the pharmacy for refills or they can request refills through [place]:  Please schedule appointment if longer than 1 year  NEXT APPOINTMENT DATE:  MEDICATION:busPIRone (BUSPAR) 5 MG tablet  Is the patient out of medication? 2 PILLS LEFT  [id number] [name],  - [address]  Let patient know to contact pharmacy at the end of the day to make sure medication is ready.  Please notify patient to allow 48-72 hours to process  CLINICAL FILLS OUT ALL BELOW:   LAST REFILL:  QTY:  REFILL DATE:    OTHER COMMENTS:    Okay for refill?  Please advise

## 2021-06-29 NOTE — Addendum Note (Signed)
Addended by: Helene Shoe on: 06/29/2021 02:58 PM   Modules accepted: Orders

## 2021-06-30 MED ORDER — BUSPIRONE HCL 5 MG PO TABS: 5.0000 mg | ORAL_TABLET | Freq: Every day | ORAL | 0 refills | Status: DC

## 2021-06-30 NOTE — Telephone Encounter (Signed)
Patient called to check status on refill as it has been a week since she requested and she is completely out.

## 2021-06-30 NOTE — Telephone Encounter (Signed)
Last OV 02/17/21 Next OV not scheduled Last refill 02/17/21 #90/0

## 2021-07-27 DIAGNOSIS — M65331 Trigger finger, right middle finger: Secondary | ICD-10-CM | POA: Diagnosis not present

## 2021-07-27 DIAGNOSIS — M6530 Trigger finger, unspecified finger: Secondary | ICD-10-CM | POA: Diagnosis not present

## 2021-07-27 DIAGNOSIS — G5602 Carpal tunnel syndrome, left upper limb: Secondary | ICD-10-CM | POA: Diagnosis not present

## 2021-07-27 DIAGNOSIS — G5601 Carpal tunnel syndrome, right upper limb: Secondary | ICD-10-CM | POA: Diagnosis not present

## 2021-07-27 DIAGNOSIS — G5603 Carpal tunnel syndrome, bilateral upper limbs: Secondary | ICD-10-CM | POA: Diagnosis not present

## 2021-08-15 NOTE — Telephone Encounter (Signed)
Pt wanted Tabitha but she wanted wednesay afternoon. Tabitha didn't have an afternoon appt on Wednesday only morning. Pt stated that it would be ok to schedule with [facility].

## 2021-08-15 NOTE — Telephone Encounter (Signed)
Debbie Walter, her new patient appointment is with [facility].  If she wants to see Lawerance Bach, can you make sure that happens and let her know appointment time.  I am happy to do them until her new PCP appointment.  Butch Penny, can you make sure that I have them on my desk.

## 2021-08-15 NOTE — Telephone Encounter (Signed)
Spoke with Debbie Walter and ask her to have her office refax her forms directly to me at [phone].  Patient is appreciative of Dr. Lorelei Pont completing her paperwork for her until she can get established with [facility].  She also wanted to let Dr. Lorelei Pont know that the higher dose of Prozac is working much better.

## 2021-08-15 NOTE — Telephone Encounter (Signed)
Pt called stating she was a [employer] pt. Pt states that she will be doing a TOC with Eugenia Pancoast when we return to [facility]. Pt is asking if Dr Lorelei Pont would fill out her FMLA papers again.Pt states that she faxed her FMLA papers today. Pt states that she would need the paper by 09/26/2021.

## 2021-08-17 NOTE — Telephone Encounter (Signed)
Completed form faxed back to [place] at [phone].

## 2021-08-17 NOTE — Telephone Encounter (Signed)
This has been done.

## 2021-08-24 DIAGNOSIS — G5603 Carpal tunnel syndrome, bilateral upper limbs: Secondary | ICD-10-CM | POA: Diagnosis not present

## 2021-08-25 ENCOUNTER — Telehealth: Payer: Self-pay | Admitting: Internal Medicine

## 2021-08-25 NOTE — Telephone Encounter (Signed)
Pt called checking on status of her FMLA papers. Pt stated that she would also like a copy mailed to her for her records. Please advise.

## 2021-08-25 NOTE — Telephone Encounter (Signed)
Left message for Debbie Walter that FMLA paperwork was faxed on 08/17/2021 and I will drop her a copy in the mail today.

## 2021-08-31 DIAGNOSIS — G5601 Carpal tunnel syndrome, right upper limb: Secondary | ICD-10-CM | POA: Insufficient documentation

## 2021-08-31 DIAGNOSIS — M65331 Trigger finger, right middle finger: Secondary | ICD-10-CM | POA: Diagnosis not present

## 2021-09-18 ENCOUNTER — Other Ambulatory Visit: Payer: Self-pay | Admitting: Student

## 2021-09-18 DIAGNOSIS — I10 Essential (primary) hypertension: Secondary | ICD-10-CM

## 2021-09-20 ENCOUNTER — Other Ambulatory Visit: Payer: Self-pay | Admitting: Internal Medicine

## 2021-09-20 NOTE — Telephone Encounter (Signed)
Name of Medication: alprazolam 0.25 mg  Name of [facility] [place] or Written Date and Quantity: # 30 on 02/17/21 Last Office Visit and Type: 02/17/21 for general anxiety and panic attacks Next Office Visit and Type: 10/05/2021 TOC to [facility] NP   Sending request to [place].

## 2021-09-20 NOTE — Telephone Encounter (Signed)
°  Encourage patient to contact the pharmacy for refills or they can request refills through [place]:  Please schedule appointment if longer than 1 year  NEXT APPOINTMENT DATE:10/05/21  MEDICATION:ALPRAZolam (XANAX) 0.25 MG tablet  Is the patient out of medication?   [name], [place] - [address]  Let patient know to contact pharmacy at the end of the day to make sure medication is ready.  Please notify patient to allow 48-72 hours to process  CLINICAL FILLS OUT ALL BELOW:   LAST REFILL:  QTY:  REFILL DATE:    OTHER COMMENTS:    Okay for refill?  Please advise

## 2021-09-21 ENCOUNTER — Encounter: Payer: Federal, State, Local not specified - PPO | Admitting: Nurse Practitioner

## 2021-09-21 MED ORDER — ALPRAZOLAM 0.25 MG PO TABS: 0.2500 mg | ORAL_TABLET | Freq: Every day | ORAL | 0 refills | Status: DC | PRN

## 2021-09-21 NOTE — Telephone Encounter (Signed)
Pt notified alprazolam 0.25 mg refill done and pt voiced understanding and appreciative due to her father being in hospice. Pt has other refills and has let the pharmacy know. Nothing further needed at this time.

## 2021-09-23 ENCOUNTER — Other Ambulatory Visit: Payer: Self-pay | Admitting: Family

## 2021-09-26 ENCOUNTER — Other Ambulatory Visit: Payer: Self-pay | Admitting: Internal Medicine

## 2021-09-26 MED ORDER — BUSPIRONE HCL 5 MG PO TABS: 5.0000 mg | ORAL_TABLET | Freq: Every day | ORAL | 0 refills | Status: DC

## 2021-09-26 NOTE — Telephone Encounter (Signed)
°  Encourage patient to contact the pharmacy for refills or they can request refills through [place]:  Please schedule appointment if longer than 1 year  NEXT APPOINTMENT DATE: 10/05/21  MEDICATION: busPIRone (BUSPAR) 5 MG tablet  Is the patient out of medication? Yes- she is out   [facility] [address]  Let patient know to contact pharmacy at the end of the day to make sure medication is ready.  Please notify patient to allow 48-72 hours to process  CLINICAL FILLS OUT ALL BELOW:   LAST REFILL:  QTY:  REFILL DATE:    OTHER COMMENTS:    Okay for refill?  Please advise

## 2021-09-26 NOTE — Telephone Encounter (Signed)
Name of Medication: buspirone 5 mg [facility] of [facility] [place] [facility] [place] or Written Date and Quantity: # 67  on 06/30/21 Last Office Visit and Type: 02/17/21 general anxiety and panic attacks Next Office Visit and Type: 10/05/21 TOC Romilda Garret NP   See 09/23/21 refill request that was denied by Jerrel Ivory CMA. Sending note to provider Romilda Garret NP as TOC appt on 10/05/21.Please advise. Denisha noted that pt is out of medication.

## 2021-09-26 NOTE — Telephone Encounter (Signed)
Pt notified that Romilda Garret NP did refill the buspirone 5 mg and pt plans on keeping her TOC appt. Pt appreciative that med refilled. Nothing further needed at this time.

## 2021-09-26 NOTE — Addendum Note (Signed)
Addended by: Helene Shoe on: 09/26/2021 11:05 AM   Modules accepted: Orders

## 2021-10-05 ENCOUNTER — Encounter: Payer: Federal, State, Local not specified - PPO | Admitting: Nurse Practitioner

## 2021-10-12 ENCOUNTER — Ambulatory Visit: Payer: Federal, State, Local not specified - PPO | Admitting: Student

## 2021-11-09 ENCOUNTER — Encounter: Payer: Self-pay | Admitting: Nurse Practitioner

## 2021-11-09 ENCOUNTER — Ambulatory Visit: Payer: Federal, State, Local not specified - PPO | Admitting: Nurse Practitioner

## 2021-11-09 ENCOUNTER — Other Ambulatory Visit: Payer: Self-pay

## 2021-11-09 VITALS — BP 128/76 | HR 73 | Temp 97.6°F | Resp 12 | Ht 61.0 in | Wt 210.4 lb

## 2021-11-09 DIAGNOSIS — R21 Rash and other nonspecific skin eruption: Secondary | ICD-10-CM

## 2021-11-09 DIAGNOSIS — R7303 Prediabetes: Secondary | ICD-10-CM

## 2021-11-09 DIAGNOSIS — R635 Abnormal weight gain: Secondary | ICD-10-CM

## 2021-11-09 DIAGNOSIS — E7800 Pure hypercholesterolemia, unspecified: Secondary | ICD-10-CM

## 2021-11-09 DIAGNOSIS — E282 Polycystic ovarian syndrome: Secondary | ICD-10-CM

## 2021-11-09 DIAGNOSIS — Z760 Encounter for issue of repeat prescription: Secondary | ICD-10-CM

## 2021-11-09 DIAGNOSIS — F32A Depression, unspecified: Secondary | ICD-10-CM

## 2021-11-09 DIAGNOSIS — K219 Gastro-esophageal reflux disease without esophagitis: Secondary | ICD-10-CM | POA: Diagnosis not present

## 2021-11-09 DIAGNOSIS — F4321 Adjustment disorder with depressed mood: Secondary | ICD-10-CM | POA: Insufficient documentation

## 2021-11-09 DIAGNOSIS — F411 Generalized anxiety disorder: Secondary | ICD-10-CM

## 2021-11-09 DIAGNOSIS — I10 Essential (primary) hypertension: Secondary | ICD-10-CM

## 2021-11-09 DIAGNOSIS — E042 Nontoxic multinodular goiter: Secondary | ICD-10-CM | POA: Diagnosis not present

## 2021-11-09 DIAGNOSIS — F419 Anxiety disorder, unspecified: Secondary | ICD-10-CM

## 2021-11-09 DIAGNOSIS — F410 Panic disorder [episodic paroxysmal anxiety] without agoraphobia: Secondary | ICD-10-CM

## 2021-11-09 MED ORDER — NYSTATIN 100000 UNIT/GM EX POWD: 1.0000 "application " | Freq: Three times a day (TID) | CUTANEOUS | 0 refills | Status: DC

## 2021-11-09 MED ORDER — NITROGLYCERIN 0.4 MG SL SUBL: 0.4000 mg | SUBLINGUAL_TABLET | SUBLINGUAL | 1 refills | Status: DC | PRN

## 2021-11-09 NOTE — Assessment & Plan Note (Signed)
Patient has gained weight as of late.  Does have historical diagnosis of DM type II in chart review of last 6 years with A1c's all were 6.3 or below.  Pending lab results today

## 2021-11-09 NOTE — Assessment & Plan Note (Signed)
Patient has had a 42 pound weight gain in under a year.  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  Encouraged healthy lifestyle modifications pending lab results

## 2021-11-09 NOTE — Assessment & Plan Note (Signed)
Maintained on famotidine 10 mg daily.  Patient states when she lost weight that also helped.  As of late has gained some weight back continue taking famotidine 10 mg daily

## 2021-11-09 NOTE — Patient Instructions (Signed)
Nice to see you today Sent in powder and nitro tablets I want to see you in 6 months, sooner if you need me

## 2021-11-09 NOTE — Assessment & Plan Note (Signed)
Thinks the patient is currently going through some grief given that her father passed away approximately 1 month ago.  Offered to refer patient for grief counseling.  Patient states that her father was in hospice and they have offered her grief counseling to their service.  Also encouraged her to check through her employer to see if they offer any type of therapy or counseling.

## 2021-11-09 NOTE — Progress Notes (Signed)
Established Patient Office Visit  Subjective:  Patient ID: Debbie Walter, female    DOB: 1980-09-26  Age: 41 y.o. MRN: [id number]  CC:  Chief Complaint  Patient presents with   Transfer of Care   Urticaria    Noticed break out in folding places of her body-like under breasts, near arm pits, etc. She lost her father on 10/07/21 and had Covid close to that time also and noticed hives break out about a week and a half after loosing her dad. Stress vs contact dermatitis. Off and on come and go now-especially when she is hot/sweating more.    HPI Debbie Walter presents for TOC  HTN: propanolol and spironolactone. Sees Cardiology, [facility] cardiology. Dr Moshe Salisbury. States sees them yearly. States she does not check her blood pressure regularly. She has the ability to check bp at home   GERD: currently on pepcid daily. Well controlled when she lost weight that really helped.  Thyroid nodules: partial thryoidectomy last [place] was 10/01/2015. Was followed by Dr De Blanch but has not seen in a long period of time.   DM2: Last A1C was 5.7 patinet is not currently on glycemic lowering medications. Past 6 years A1C were below dx of DM, highest was 6.3%. Did take metformin in the past to help with PCOS  Anxiety and depression: currenlty maintained on prozac, buspar and alprazolam prn. Does not want to do grief counselling currently. No HI/SI/AVH  HLD: simvastatin and fish oils. No regular exercise since she has been having family stuff and recently last her father.  Also has had a increase in her weight  Pruritis: States that she started itching approx 1.5 weeks after his death. States she thought it was shingles. But did not hurt and had some discharge. States she thinks   Patient is followed by GYN and they handle her PAP smears along with her mammograms  Colonoscopy: never had one. Patient is concerned because her father had rectal cancer dx in the early 21s. Told her that we would start screening at  13, sooner if she has changes in bowel habits or other concerning symptoms   EYE: Dr Delia Heady within a year. Looking for new eye care professional    PHQ9 SCORE ONLY 04/07/2020 03/26/2019 06/13/2018  PHQ-9 Total Score 3 3 0    GAD 7 : Generalized Anxiety Score 04/07/2020  Nervous, Anxious, on Edge 1  Control/stop worrying 1  Worry too much - different things 1  Trouble relaxing 1  Restless 1  Easily annoyed or irritable 1  Afraid - awful might happen 1  Total GAD 7 Score 7  Anxiety Difficulty Somewhat difficult     Past Medical History:  Diagnosis Date   Fibroids    GERD (gastroesophageal reflux disease)    takes protonix   History of kidney stones    Hypertension    MRSA infection    under arm   Panic attacks    PCOS (polycystic ovarian syndrome)    takes metformin for this    Past Surgical History:  Procedure Laterality Date   CESAREAN SECTION     CESAREAN SECTION WITH BILATERAL TUBAL LIGATION Bilateral 05/15/2014   Procedure: CESAREAN SECTION WITH BILATERAL TUBAL LIGATION;  Surgeon: Cyril Mourning, MD;  Location: [facility];  Service: Obstetrics;  Laterality: Bilateral;  repeat  edc 06/10/14   CHOLECYSTECTOMY N/A 10/31/2017   Procedure: LAPAROSCOPIC CHOLECYSTECTOMY;  Surgeon: Coralie Keens, MD;  Location: [facility];  Service: General;  Laterality: N/A;  HYSTEROSCOPY     PARATHYROIDECTOMY     REMOVED PART OF THYROID    PARATHYROIDECTOMY      Family History  Problem Relation Age of Onset   Hyperlipidemia Mother    Heart disease Mother    Diabetes Mother    Hypertension Mother    Breast cancer Mother    Kidney failure Mother    Hyperlipidemia Father    Hypertension Father    Cancer Father        rectal   Hypertension Sister    Anesthesia problems Neg Hx    Hypotension Neg Hx    Malignant hyperthermia Neg Hx    Pseudochol deficiency Neg Hx     Social History   Socioeconomic History   Marital status: Married    Spouse name: Lesleigh Hughson   Number of children:  2   Years of education: Not on file   Highest education level: Not on file  Occupational History   Not on file  Tobacco Use   Smoking status: Never   Smokeless tobacco: Never  Vaping Use   Vaping Use: Never used  Substance and Sexual Activity   Alcohol use: No    Alcohol/week: 0.0 standard drinks   Drug use: No   Sexual activity: Yes    Birth control/protection: None  Other Topics Concern   Not on file  Social History Narrative   Not on file   Social Determinants of Health   Financial Resource Strain: Not on file  Food Insecurity: Not on file  Transportation Needs: Not on file  Physical Activity: Not on file  Stress: Not on file  Social Connections: Not on file  Intimate Partner Violence: Not on file    Outpatient Medications Prior to Visit  Medication Sig Dispense Refill   ALPRAZolam (XANAX) 0.25 MG tablet Take 1 tablet (0.25 mg total) by mouth daily as needed for anxiety. 30 tablet 0   APPLE CIDER VINEGAR PO Take by mouth.     ascorbic acid (VITAMIN C) 100 MG tablet Take by mouth.     aspirin EC 81 MG tablet Take 81 mg by mouth daily.     busPIRone (BUSPAR) 5 MG tablet Take 1 tablet (5 mg total) by mouth daily. 90 tablet 0   cholecalciferol (VITAMIN D) 1000 UNITS tablet Take 1,000 Units by mouth daily.      docusate sodium (COLACE) 100 MG capsule Take 100 mg by mouth 2 (two) times daily.     famotidine (PEPCID) 10 MG tablet Take 10 mg by mouth daily.     FLUoxetine (PROZAC) 20 MG capsule Take 1 capsule (20 mg total) by mouth daily. 90 capsule 3   Multiple Vitamin (MULTIVITAMIN) tablet Take 1 tablet by mouth daily.     Omega-3 Fatty Acids (FISH OIL) 1000 MG CAPS Take 1 capsule by mouth daily.     Probiotic Product (PROBIOTIC-10 PO) Take by mouth.     propranolol ER (INDERAL LA) 160 MG SR capsule TAKE 1 CAPSULE BY MOUTH EVERY OTHER DAY 45 capsule 2   simvastatin (ZOCOR) 40 MG tablet TAKE 1/2 TABLET BY MOUTH EVERY EVENING 45 tablet 3   spironolactone (ALDACTONE) 50 MG  tablet TAKE 1 TABLET BY MOUTH EVERY DAY 90 tablet 2   Zinc 50 MG TABS Take by mouth.     hydrocortisone 2.5 % cream hydrocortisone 2.5 % topical cream  APPLY TO AFFECTED AREA TWICE A DAY FOR 2 WEEKS (Patient not taking: Reported on 11/09/2021)  nitroGLYCERIN (NITROSTAT) 0.4 MG SL tablet Place 0.4 mg under the tongue every 5 (five) minutes as needed for chest pain.  (Patient not taking: Reported on 11/09/2021)  1   Biotin 2500 MCG CAPS Take by mouth.     FLUoxetine (PROZAC) 10 MG tablet TAKE 1 TABLET BY MOUTH EVERY DAY 90 tablet 0   No facility-administered medications prior to visit.    Allergies  Allergen Reactions   Procardia [Nifedipine] Other (See Comments)    headache   Wellbutrin [Bupropion] Other (See Comments)    "Makes my blood pressure go up - I don't ever want to take it again"   Labetalol Other (See Comments)    "makes me feel like things are crawling through my head"   Hydromorphone Other (See Comments)    Makes pt feel out of this world. Patient states it needs to be pushed slow, but she can take it.   Morphine And Related     Needs to be pushed slow or pt feels like she will code   Sudafed [Pseudoephedrine Hcl]     Heart race / dries out mucus membranes   Zithromax [Azithromycin] Other (See Comments)    Gi upset.  Diarrhea    ROS Review of Systems  Constitutional:  Positive for fatigue (improving). Negative for chills and fever.  Eyes:  Negative for visual disturbance.  Respiratory:  Negative for cough and shortness of breath.   Cardiovascular:  Negative for chest pain and leg swelling.  Gastrointestinal:  Negative for blood in stool, diarrhea, nausea and vomiting.       BM daily to every 2 days   Genitourinary:  Negative for difficulty urinating, dysuria, frequency, hematuria, vaginal bleeding, vaginal discharge and vaginal pain.  Skin:  Positive for rash.       Pruritis   Neurological:  Negative for dizziness, light-headedness, numbness and headaches.   Psychiatric/Behavioral:  Negative for hallucinations and suicidal ideas.      Objective:    Physical Exam Vitals and nursing note reviewed. Exam conducted with a chaperone present [facility], RMA).  Constitutional:      Appearance: She is obese.  HENT:     Right Ear: Tympanic membrane, ear canal and external ear normal.     Left Ear: Tympanic membrane, ear canal and external ear normal.     Mouth/Throat:     Mouth: Mucous membranes are moist.     Pharynx: Oropharynx is clear.  Eyes:     Extraocular Movements: Extraocular movements intact.     Pupils: Pupils are equal, round, and reactive to light.  Neck:     Thyroid: No thyroid mass, thyromegaly or thyroid tenderness.  Cardiovascular:     Rate and Rhythm: Normal rate and regular rhythm.     Pulses: Normal pulses.     Heart sounds: Normal heart sounds.  Pulmonary:     Effort: Pulmonary effort is normal.     Breath sounds: Normal breath sounds.  Musculoskeletal:     Right lower leg: No edema.     Left lower leg: No edema.  Lymphadenopathy:     Cervical: No cervical adenopathy.  Skin:    General: Skin is warm.  Neurological:     General: No focal deficit present.     Mental Status: She is alert.     Deep Tendon Reflexes:     Reflex Scores:      Bicep reflexes are 2+ on the right side and 2+ on the left side.  Patellar reflexes are 2+ on the right side and 2+ on the left side.    Comments: Bilateral upper and lower extremity strength 5/5  Psychiatric:        Mood and Affect: Mood normal.        Behavior: Behavior normal.        Thought Content: Thought content normal.        Judgment: Judgment normal.    BP 128/76    Pulse 73    Temp 97.6 F (36.4 C)    Resp 12    Ht 5\' 1"  (1.549 m)    Wt 210 lb 7 oz (95.5 kg)    SpO2 99%    BMI 39.76 kg/m  Wt Readings from Last 3 Encounters:  11/09/21 210 lb 7 oz (95.5 kg)  02/17/21 168 lb (76.2 kg)  10/13/20 157 lb 12.8 oz (71.6 kg)     Health Maintenance Due   Topic Date Due   OPHTHALMOLOGY EXAM  Never done   URINE MICROALBUMIN  Never done   Hepatitis C Screening  Never done   COVID-19 Vaccine (3 - Booster for Pfizer series) 02/02/2020   HEMOGLOBIN A1C  10/08/2020   FOOT EXAM  04/07/2021   PAP SMEAR-Modifier  07/10/2021    There are no preventive care reminders to display for this patient.  Lab Results  Component Value Date   TSH 1.14 04/07/2020   Lab Results  Component Value Date   WBC 9.0 04/07/2020   HGB 12.6 04/07/2020   HCT 37.9 04/07/2020   MCV 79.4 04/07/2020   PLT 348.0 04/07/2020   Lab Results  Component Value Date   NA 139 04/07/2020   K 4.6 04/07/2020   CO2 29 04/07/2020   GLUCOSE 99 04/07/2020   BUN 16 04/07/2020   CREATININE 0.84 04/07/2020   BILITOT 0.4 04/07/2020   ALKPHOS 65 04/07/2020   AST 13 04/07/2020   ALT 17 04/07/2020   PROT 7.0 04/07/2020   ALBUMIN 4.4 04/07/2020   CALCIUM 10.4 04/07/2020   ANIONGAP 10 06/14/2019   GFR 90.45 04/07/2020   Lab Results  Component Value Date   CHOL 117 04/07/2020   Lab Results  Component Value Date   HDL 36.80 (L) 04/07/2020   Lab Results  Component Value Date   LDLCALC 50 04/07/2020   Lab Results  Component Value Date   TRIG 149.0 04/07/2020   Lab Results  Component Value Date   CHOLHDL 3 04/07/2020   Lab Results  Component Value Date   HGBA1C 5.7 04/07/2020      Assessment & Plan:   Problem List Items Addressed This Visit       Cardiovascular and Mediastinum   HTN (hypertension) - Primary    Patient is currently maintained on propanolol spironolactone.  Patient has the ability to check blood pressures but does not infrequently.  Do encourage patient check blood pressures 3 times weekly continue take medication as prescribed follow-up with cardiology as recommended      Relevant Medications   nitroGLYCERIN (NITROSTAT) 0.4 MG SL tablet   Other Relevant Orders   CBC   Comprehensive metabolic panel     Digestive   GERD  (gastroesophageal reflux disease)    Maintained on famotidine 10 mg daily.  Patient states when she lost weight that also helped.  As of late has gained some weight back continue taking famotidine 10 mg daily      Relevant Medications   Probiotic Product (PROBIOTIC-10 PO)  Endocrine   PCOS (polycystic ovarian syndrome)    Is managed by GYN.  Patient states she took metformin in the past to help with her fertility.  She has 2 children currently.  Is followed by GYN follow-up as scheduled      Multiple thyroid nodules    Patient had a partial thyroidectomy last ultrasound 10/01/2015 was recently followed by Dr. De Blanch but has not seen her an extended period of time.  Labs in office        Musculoskeletal and Integument   Rash    Patient complained of rashes presenting under bilateral breast folds in her pannicular fold.  States she is using some hydrocortisone 2.5% cream that did help.  On exam nothing apparent but possible yeast infection of skin given location and moisture content of skin we will send in some nystatin powder she can use as needed if this continues or does not resolve she can follow-up in office.  Also recommend using over-the-counter second-generation antihistamine such as Zyrtec        Other   Generalized anxiety disorder with panic attacks (Chronic)   Anxiety and depression    PHQ-9 and GAD-7 administered in office.  Patient currently maintained on Prozac, BuSpar, and Xanax as needed.  Patient denies HI/SI/AVH.  To take medication as prescribed.  Did also offer therapy or counseling the patient.  She is undecided at this time but will hold off      HLD (hyperlipidemia)    Currently maintained on simvastatin.  Patient also takes omega-3 fatty fish oils.  Her exercise has decreased as of late with her father being sick and recently passing and then having to help out with her mother.  She is also gained some weight.  Patient is going to work on healthy lifestyle  modifications pending lab results      Relevant Medications   nitroGLYCERIN (NITROSTAT) 0.4 MG SL tablet   Other Relevant Orders   Lipid panel   Prediabetes    Patient has gained weight as of late.  Does have historical diagnosis of DM type II in chart review of last 6 years with A1c's all were 6.3 or below.  Pending lab results today      Relevant Orders   Hemoglobin A1c   TSH   Weight gain    Patient has had a 42 pound weight gain in under a year.  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  Encouraged healthy lifestyle modifications pending lab results      Relevant Orders   Hemoglobin A1c   TSH   Lipid panel   Grief    Thinks the patient is currently going through some grief given that her father passed away approximately 1 month ago.  Offered to refer patient for grief counseling.  Patient states that her father was in hospice and they have offered her grief counseling to their service.  Also encouraged her to check through her employer to see if they offer any type of therapy or counseling.      Other Visit Diagnoses     Medication refill       Relevant Medications   nitroGLYCERIN (NITROSTAT) 0.4 MG SL tablet       Meds ordered this encounter  Medications   nitroGLYCERIN (NITROSTAT) 0.4 MG SL tablet    Sig: Place 1 tablet (0.4 mg total) under the tongue every 5 (five) minutes as needed for  chest pain.    Dispense:  28 tablet    Refill:  1    QS on quantity   nystatin (MYCOSTATIN/NYSTOP) powder    Sig: Apply 1 application topically 3 (three) times daily. As needed    Dispense:  15 g    Refill:  0    Order Specific Question:   Supervising Provider    Answer:   Loura Pardon A [[id number]]    Follow-up: Return in about 6 months (around 05/09/2022) for CPE.  This visit occurred during the SARS-CoV-2 public health emergency.  Safety protocols were in place, including screening questions prior to the visit,  additional usage of staff PPE, and extensive cleaning of exam room while observing appropriate contact time as indicated for disinfecting solutions.     Romilda Garret, NP

## 2021-11-09 NOTE — Assessment & Plan Note (Signed)
PHQ-9 and GAD-7 administered in office.  Patient currently maintained on Prozac, BuSpar, and Xanax as needed.  Patient denies HI/SI/AVH.  To take medication as prescribed.  Did also offer therapy or counseling the patient.  She is undecided at this time but will hold off

## 2021-11-09 NOTE — Assessment & Plan Note (Signed)
Patient complained of rashes presenting under bilateral breast folds in her pannicular fold.  States she is using some hydrocortisone 2.5% cream that did help.  On exam nothing apparent but possible yeast infection of skin given location and moisture content of skin we will send in some nystatin powder she can use as needed if this continues or does not resolve she can follow-up in office.  Also recommend using over-the-counter second-generation antihistamine such as Zyrtec

## 2021-11-09 NOTE — Assessment & Plan Note (Signed)
Patient is currently maintained on propanolol spironolactone.  Patient has the ability to check blood pressures but does not infrequently.  Do encourage patient check blood pressures 3 times weekly continue take medication as prescribed follow-up with cardiology as recommended

## 2021-11-09 NOTE — Assessment & Plan Note (Signed)
Patient had a partial thyroidectomy last ultrasound 10/01/2015 was recently followed by Dr. De Blanch but has not seen her an extended period of time.  Labs in office

## 2021-11-09 NOTE — Assessment & Plan Note (Signed)
Is managed by GYN.  Patient states she took metformin in the past to help with her fertility.  She has 2 children currently.  Is followed by GYN follow-up as scheduled

## 2021-11-09 NOTE — Assessment & Plan Note (Signed)
Currently maintained on simvastatin.  Patient also takes omega-3 fatty fish oils.  Her exercise has decreased as of late with her father being sick and recently passing and then having to help out with her mother.  She is also gained some weight.  Patient is going to work on healthy lifestyle modifications pending lab results

## 2021-11-10 LAB — COMPREHENSIVE METABOLIC PANEL
ALT: 19 U/L (ref 0–35)
AST: 14 U/L (ref 0–37)
Albumin: 4.4 g/dL (ref 3.5–5.2)
Alkaline Phosphatase: 64 U/L (ref 39–117)
BUN: 15 mg/dL (ref 6–23)
CO2: 32 mEq/L (ref 19–32)
Calcium: 9.7 mg/dL (ref 8.4–10.5)
Chloride: 104 mEq/L (ref 96–112)
Creatinine, Ser: 0.8 mg/dL (ref 0.40–1.20)
GFR: 90.83 mL/min (ref >60.00)
Glucose, Bld: 97 mg/dL (ref 70–99)
Potassium: 4.2 mEq/L (ref 3.5–5.1)
Sodium: 140 mEq/L (ref 135–145)
Total Bilirubin: 0.3 mg/dL (ref 0.2–1.2)
Total Protein: 6.6 g/dL (ref 6.0–8.3)

## 2021-11-10 LAB — CBC
HCT: 38.9 % (ref 36.0–46.0)
Hemoglobin: 13 g/dL (ref 12.0–15.0)
MCHC: 33.5 g/dL (ref 30.0–36.0)
MCV: 84.6 fl (ref 78.0–100.0)
Platelets: 286 10*3/uL (ref 150.0–400.0)
RBC: 4.6 Mil/uL (ref 3.87–5.11)
RDW: 14.5 % (ref 11.5–15.5)
WBC: 9.1 10*3/uL (ref 4.0–10.5)

## 2021-11-10 LAB — LDL CHOLESTEROL, DIRECT: Direct LDL: 61 mg/dL

## 2021-11-10 LAB — LIPID PANEL
Cholesterol: 120 mg/dL (ref 0–200)
HDL: 35.4 mg/dL — ABNORMAL LOW (ref >39.00)
NonHDL: 84.75
Total CHOL/HDL Ratio: 3
Triglycerides: 204 mg/dL — ABNORMAL HIGH (ref 0.0–149.0)
VLDL: 40.8 mg/dL — ABNORMAL HIGH (ref 0.0–40.0)

## 2021-11-10 LAB — TSH: TSH: 1.77 u[IU]/mL (ref 0.35–5.50)

## 2021-11-10 LAB — HEMOGLOBIN A1C: Hgb A1c MFr Bld: 6.3 % (ref 4.6–6.5)

## 2021-11-17 ENCOUNTER — Other Ambulatory Visit: Payer: Self-pay | Admitting: Student

## 2021-11-18 ENCOUNTER — Telehealth: Payer: Self-pay | Admitting: Nurse Practitioner

## 2021-11-18 NOTE — Telephone Encounter (Signed)
-----   Message from [place] sent at 11/17/2021  9:24 AM EST ----- ?Regarding: update on patient diagnoses ?Is this patient still diabetic? I requested eye exam on her but they did not have her as diabetic so no DM eye exam has been done. Before I call eye office to tell them to add this diagnoses and do the exam I wanted to verify with you first.  ? ?

## 2021-11-18 NOTE — Telephone Encounter (Signed)
noted 

## 2021-11-18 NOTE — Telephone Encounter (Signed)
Patient last A1C was below the threshold of diabetic. She had one A1C 2 years ago that was 6.5 that qualified her to be diabetic. Currently lab is in the range of prediabetes ?

## 2021-11-18 NOTE — Telephone Encounter (Signed)
-----   Message from [place] sent at 11/17/2021  9:24 AM EST ----- ?Regarding: update on patient diagnoses ?Is this patient still diabetic? I requested eye exam on her but they did not have her as diabetic so no DM eye exam has been done. Before I call eye office to tell them to add this diagnoses and do the exam I wanted to verify with you first.  ? ?

## 2021-11-25 DIAGNOSIS — H9202 Otalgia, left ear: Secondary | ICD-10-CM | POA: Diagnosis not present

## 2021-11-28 DIAGNOSIS — H6692 Otitis media, unspecified, left ear: Secondary | ICD-10-CM | POA: Diagnosis not present

## 2021-11-28 DIAGNOSIS — H60502 Unspecified acute noninfective otitis externa, left ear: Secondary | ICD-10-CM | POA: Diagnosis not present

## 2021-12-23 ENCOUNTER — Other Ambulatory Visit: Payer: Self-pay | Admitting: Nurse Practitioner

## 2021-12-26 NOTE — Telephone Encounter (Signed)
Patient called to follow up  only one tab left.  ?

## 2021-12-27 NOTE — Telephone Encounter (Signed)
Routed to [employer] ?

## 2022-02-16 ENCOUNTER — Telehealth: Payer: Self-pay

## 2022-02-16 NOTE — Telephone Encounter (Signed)
Received forms for patient, leave of absence. Placed in [facility] folder under [employer] paperwork. Debbie Walter states that she is needing these completed by Monday if possible.

## 2022-02-17 NOTE — Telephone Encounter (Signed)
Form completed and handed back to Debbie Walter for faxing and scanning into medical record

## 2022-02-20 NOTE — Telephone Encounter (Signed)
Called patient to notify her that FMLA forms have been completed and faxed to fax# [phone].  Copy made for myself, scanning, and patient.  Copy for patient is being mailed out today.  Verified mailing address.

## 2022-02-22 DIAGNOSIS — G5601 Carpal tunnel syndrome, right upper limb: Secondary | ICD-10-CM | POA: Diagnosis not present

## 2022-02-22 DIAGNOSIS — M65331 Trigger finger, right middle finger: Secondary | ICD-10-CM | POA: Diagnosis not present

## 2022-02-22 DIAGNOSIS — G5602 Carpal tunnel syndrome, left upper limb: Secondary | ICD-10-CM | POA: Diagnosis not present

## 2022-03-01 DIAGNOSIS — M65331 Trigger finger, right middle finger: Secondary | ICD-10-CM | POA: Diagnosis not present

## 2022-03-06 DIAGNOSIS — M25641 Stiffness of right hand, not elsewhere classified: Secondary | ICD-10-CM | POA: Diagnosis not present

## 2022-03-15 DIAGNOSIS — M25641 Stiffness of right hand, not elsewhere classified: Secondary | ICD-10-CM | POA: Diagnosis not present

## 2022-03-22 DIAGNOSIS — M25641 Stiffness of right hand, not elsewhere classified: Secondary | ICD-10-CM | POA: Diagnosis not present

## 2022-03-23 ENCOUNTER — Other Ambulatory Visit: Payer: Self-pay | Admitting: Family Medicine

## 2022-03-27 ENCOUNTER — Other Ambulatory Visit: Payer: Self-pay | Admitting: Student

## 2022-03-27 ENCOUNTER — Other Ambulatory Visit: Payer: Self-pay | Admitting: Nurse Practitioner

## 2022-03-29 DIAGNOSIS — M25641 Stiffness of right hand, not elsewhere classified: Secondary | ICD-10-CM | POA: Diagnosis not present

## 2022-03-29 DIAGNOSIS — Z1231 Encounter for screening mammogram for malignant neoplasm of breast: Secondary | ICD-10-CM | POA: Diagnosis not present

## 2022-03-30 ENCOUNTER — Other Ambulatory Visit: Payer: Self-pay | Admitting: Student

## 2022-03-30 DIAGNOSIS — I10 Essential (primary) hypertension: Secondary | ICD-10-CM

## 2022-04-05 DIAGNOSIS — M25641 Stiffness of right hand, not elsewhere classified: Secondary | ICD-10-CM | POA: Diagnosis not present

## 2022-04-19 DIAGNOSIS — I10 Essential (primary) hypertension: Secondary | ICD-10-CM | POA: Diagnosis not present

## 2022-04-19 DIAGNOSIS — N926 Irregular menstruation, unspecified: Secondary | ICD-10-CM | POA: Diagnosis not present

## 2022-04-19 DIAGNOSIS — Z01419 Encounter for gynecological examination (general) (routine) without abnormal findings: Secondary | ICD-10-CM | POA: Diagnosis not present

## 2022-05-09 ENCOUNTER — Telehealth: Payer: Self-pay | Admitting: Nurse Practitioner

## 2022-05-09 NOTE — Telephone Encounter (Signed)
Patient advised that refills were sent in on 03/23/22 for a year worth. Also patient had an appointment for CPE on 05/10/22 and she last minute went out of town so had to cancel this. She will call back to reschedule

## 2022-05-09 NOTE — Telephone Encounter (Signed)
  Encourage patient to contact the pharmacy for refills or they can request refills through [facility]  Did the patient contact the pharmacy:  n   LAST APPOINTMENT DATE: 11/09/21  NEXT APPOINTMENT DATE:  MEDICATION:FLUoxetine (PROZAC) 20 MG capsule  Is the patient out of medication? n  If not, how much is left?1 weeks worth  Is this a 90 day supply: yes  [id number] [place] [address] [place]:  [phone] Fax:  [phone]     Let patient know to contact pharmacy at the end of the day to make sure medication is ready.  Please notify patient to allow 48-72 hours to process

## 2022-05-10 ENCOUNTER — Encounter: Payer: Federal, State, Local not specified - PPO | Admitting: Nurse Practitioner

## 2022-06-16 DIAGNOSIS — R051 Acute cough: Secondary | ICD-10-CM | POA: Diagnosis not present

## 2022-06-16 DIAGNOSIS — J029 Acute pharyngitis, unspecified: Secondary | ICD-10-CM | POA: Diagnosis not present

## 2022-06-23 ENCOUNTER — Other Ambulatory Visit: Payer: Self-pay

## 2022-06-23 DIAGNOSIS — I10 Essential (primary) hypertension: Secondary | ICD-10-CM

## 2022-06-23 MED ORDER — PROPRANOLOL HCL ER 160 MG PO CP24: ORAL_CAPSULE | ORAL | 0 refills | Status: DC

## 2022-07-12 ENCOUNTER — Ambulatory Visit: Payer: Federal, State, Local not specified - PPO | Admitting: Internal Medicine

## 2022-07-25 ENCOUNTER — Other Ambulatory Visit: Payer: Self-pay | Admitting: Cardiology

## 2022-07-25 ENCOUNTER — Other Ambulatory Visit: Payer: Self-pay | Admitting: Nurse Practitioner

## 2022-07-25 DIAGNOSIS — I10 Essential (primary) hypertension: Secondary | ICD-10-CM

## 2022-07-26 NOTE — Telephone Encounter (Signed)
Mychart message sent to the patient .

## 2022-07-28 NOTE — Telephone Encounter (Signed)
Left message to call back also

## 2022-08-09 ENCOUNTER — Ambulatory Visit: Payer: Federal, State, Local not specified - PPO | Admitting: Internal Medicine

## 2022-08-30 ENCOUNTER — Ambulatory Visit: Payer: Federal, State, Local not specified - PPO | Admitting: Internal Medicine

## 2022-08-31 ENCOUNTER — Telehealth: Payer: Self-pay

## 2022-08-31 NOTE — Telephone Encounter (Signed)
Form signed and placed in outbox for copying and to be faxed

## 2022-08-31 NOTE — Telephone Encounter (Signed)
We received an FMLA form for patient.  She is requesting that this one be filled out like her previous forms, with a six month time period from 07/23/22-01/21/23.  Debbie Walter wanted me to let you know she has an appt sch with you on 10/18/22.  When this is complete, she wants a copy mailed out.

## 2022-08-31 NOTE — Telephone Encounter (Signed)
Form faxed to fax#[phone].  Received fax confirmation.  Copies made for patient, scanning, and myself.  Patient's copy has been placed in outgoing mail per request.  Patient notified via phone call that this is complete.

## 2022-09-06 NOTE — Telephone Encounter (Signed)
Patient left # of [facility].  They still did not receive my fax.  I spoke to [facility], who gave me the email address of Ms Bracher's caseworker:  [email or web address].  I sent a copy of this paperwork to that email address.  I called patient to notify her.

## 2022-09-06 NOTE — Telephone Encounter (Signed)
Patient called and stated her job has not received the fax as of yet and can it be faxed again.

## 2022-09-06 NOTE — Telephone Encounter (Signed)
I spoke to patient to verify fax #.  This was originally faxed to the correct fax # and we received fax confirmation.  Per Ms Falor, every six months when we send this fax, [facility] has difficulty receiving it.  I resent the fax on a different fax machine and received a fax confirmation.  Ms Samudio will call her employer to verify they received it.  If they have not, she will call back to have me fax it again.

## 2022-09-20 ENCOUNTER — Encounter: Payer: Self-pay | Admitting: Internal Medicine

## 2022-09-20 ENCOUNTER — Ambulatory Visit: Payer: Federal, State, Local not specified - PPO | Admitting: Internal Medicine

## 2022-09-20 VITALS — BP 133/86 | HR 81 | Ht 61.0 in | Wt 221.0 lb

## 2022-09-20 DIAGNOSIS — I10 Essential (primary) hypertension: Secondary | ICD-10-CM

## 2022-09-20 DIAGNOSIS — R9431 Abnormal electrocardiogram [ECG] [EKG]: Secondary | ICD-10-CM | POA: Diagnosis not present

## 2022-09-20 MED ORDER — SPIRONOLACTONE 50 MG PO TABS: 50.0000 mg | ORAL_TABLET | Freq: Every day | ORAL | 3 refills | Status: DC

## 2022-09-20 MED ORDER — PROPRANOLOL HCL ER 160 MG PO CP24: 160.0000 mg | ORAL_CAPSULE | Freq: Every day | ORAL | 3 refills | Status: DC

## 2022-09-20 MED ORDER — SEMAGLUTIDE (1 MG/DOSE) 4 MG/3ML ~~LOC~~ SOPN: 1.0000 mg | PEN_INJECTOR | SUBCUTANEOUS | 6 refills | Status: DC

## 2022-09-20 NOTE — Progress Notes (Signed)
Primary Physician/Referring:  Michela Pitcher, NP  Patient ID: Debbie Walter, female    DOB: [birth year], 42 y.o.   MRN: [id number]  Chief Complaint  Patient presents with   Hypertension   Follow-up   HPI:    Debbie Walter  is a 42 y.o. AA female with hypertension, morbid obesity, insulin resistance, polycystic ovarian syndrome, and severe anxiety.   Patient is here for a follow-up visit. She has not been here in a very long time but she has been dealing with a lot of personal things lately. She lost her father and had to deal with other close deaths in the family around the same time. She has been very stressed and has not been taking care of herself as she should. Patient wants to restart her weight loss journey again. She is compliant with her medications and has not had any issues or side effects. Patient denies chest pain, palpitations, leg swelling, dizziness, dyspnea, syncope, near-syncope.   Past Medical History:  Diagnosis Date   Fibroids    GERD (gastroesophageal reflux disease)    takes protonix   History of kidney stones    Hypertension    MRSA infection    under arm   Panic attacks    PCOS (polycystic ovarian syndrome)    takes metformin for this   Past Surgical History:  Procedure Laterality Date   CESAREAN SECTION     CESAREAN SECTION WITH BILATERAL TUBAL LIGATION Bilateral 05/15/2014   Procedure: CESAREAN SECTION WITH BILATERAL TUBAL LIGATION;  Surgeon: Cyril Mourning, MD;  Location: [facility];  Service: Obstetrics;  Laterality: Bilateral;  repeat  edc 06/10/14   CHOLECYSTECTOMY N/A 10/31/2017   Procedure: LAPAROSCOPIC CHOLECYSTECTOMY;  Surgeon: Coralie Keens, MD;  Location: [facility];  Service: General;  Laterality: N/A;   HYSTEROSCOPY     PARATHYROIDECTOMY     REMOVED PART OF THYROID    PARATHYROIDECTOMY     Family History  Problem Relation Age of Onset   Hyperlipidemia Mother    Heart disease Mother    Diabetes Mother    Hypertension Mother    Breast  cancer Mother    Kidney failure Mother    Hyperlipidemia Father    Hypertension Father    Cancer Father        rectal   Dementia Father    Hypertension Sister    Endometriosis Sister    Anesthesia problems Neg Hx    Hypotension Neg Hx    Malignant hyperthermia Neg Hx    Pseudochol deficiency Neg Hx     Social History   Tobacco Use   Smoking status: Never   Smokeless tobacco: Never  Substance Use Topics   Alcohol use: No    Comment: Wine cooler 1 every 2 months   Marital Status: Married   ROS  Review of Systems  Constitutional: Negative for malaise/fatigue and weight gain.  Cardiovascular:  Negative for chest pain, claudication, dyspnea on exertion, leg swelling, near-syncope, orthopnea, palpitations, paroxysmal nocturnal dyspnea and syncope.  Respiratory:  Negative for shortness of breath.   Hematologic/Lymphatic: Does not bruise/bleed easily.  Gastrointestinal:  Negative for melena.  Neurological:  Negative for dizziness and weakness.    Objective  Blood pressure 133/86, pulse 81, height '5\' 1"'$  (1.549 m), weight 221 lb (100.2 kg), SpO2 99 %.     09/20/2022    2:09 PM 11/09/2021    3:16 PM 02/17/2021    4:21 PM  Vitals with BMI  Height '5\' 1"'$   $'5\' 1"'F$  '5\' 1"'$   Weight 221 lbs 210 lbs 7 oz 168 lbs  BMI 41.78 30.16 01.09  Systolic 323 557 322  Diastolic 86 76 86  Pulse 81 73 78     Physical Exam Vitals reviewed.  HENT:     Head: Normocephalic and atraumatic.  Cardiovascular:     Rate and Rhythm: Normal rate and regular rhythm.     Pulses: Intact distal pulses.     Heart sounds: S1 normal and S2 normal. No murmur heard.    No gallop.  Pulmonary:     Effort: Pulmonary effort is normal. No respiratory distress.     Breath sounds: No wheezing, rhonchi or rales.  Musculoskeletal:     Right lower leg: No edema.     Left lower leg: No edema.  Skin:    General: Skin is warm and dry.     Capillary Refill: Capillary refill takes less than 2 seconds.  Neurological:      Mental Status: She is alert.    Laboratory examination:   Recent Labs    11/09/21 1608  NA 140  K 4.2  CL 104  CO2 32  GLUCOSE 97  BUN 15  CREATININE 0.80  CALCIUM 9.7   CrCl cannot be calculated (Patient's most recent lab result is older than the maximum 21 days allowed.).     Latest Ref Rng & Units 11/09/2021    4:08 PM 04/07/2020    3:43 PM 06/14/2019    4:10 PM  CMP  Glucose 70 - 99 mg/dL 97  99  101   BUN 6 - 23 mg/dL '15  16  18   '$ Creatinine 0.40 - 1.20 mg/dL 0.80  0.84  0.85   Sodium 135 - 145 mEq/L 140  139  137   Potassium 3.5 - 5.1 mEq/L 4.2  4.6  4.0   Chloride 96 - 112 mEq/L 104  103  104   CO2 19 - 32 mEq/L 32  29  23   Calcium 8.4 - 10.5 mg/dL 9.7  10.4  8.3   Total Protein 6.0 - 8.3 g/dL 6.6  7.0  6.1   Total Bilirubin 0.2 - 1.2 mg/dL 0.3  0.4  0.3   Alkaline Phos 39 - 117 U/L 64  65  43   AST 0 - 37 U/L '14  13  25   '$ ALT 0 - 35 U/L 19  17  39       Latest Ref Rng & Units 11/09/2021    4:08 PM 04/07/2020    3:43 PM 06/13/2019    3:22 AM  CBC  WBC 4.0 - 10.5 K/uL 9.1  9.0  7.9   Hemoglobin 12.0 - 15.0 g/dL 13.0  12.6  12.2   Hematocrit 36.0 - 46.0 % 38.9  37.9  38.9   Platelets 150.0 - 400.0 K/uL 286.0  348.0  383     Lipid Panel Recent Labs    11/09/21 1608  CHOL 120  TRIG 204.0*  VLDL 40.8*  HDL 35.40*  CHOLHDL 3  LDLDIRECT 61.0    HEMOGLOBIN A1C Lab Results  Component Value Date   HGBA1C 6.3 11/09/2021   TSH Recent Labs    11/09/21 1608  TSH 1.77    Medications and allergies   Allergies  Allergen Reactions   Procardia [Nifedipine] Other (See Comments)    headache   Wellbutrin [Bupropion] Other (See Comments)    "Makes my blood pressure go up - I don't ever want  to take it again"   Labetalol Other (See Comments)    "makes me feel like things are crawling through my head"   Hydromorphone Other (See Comments)    Makes pt feel out of this world. Patient states it needs to be pushed slow, but she can take it.   Lorazepam Other  (See Comments)   Morphine And Related     Needs to be pushed slow or pt feels like she will code   Sudafed [Pseudoephedrine Hcl]     Heart race / dries out mucus membranes   Zithromax [Azithromycin] Other (See Comments)    Gi upset.  Diarrhea     Outpatient Medications Prior to Visit  Medication Sig Dispense Refill   ALPRAZolam (XANAX) 0.25 MG tablet Take 1 tablet (0.25 mg total) by mouth daily as needed for anxiety. 30 tablet 0   APPLE CIDER VINEGAR PO Take by mouth.     ascorbic acid (VITAMIN C) 100 MG tablet Take by mouth.     aspirin EC 81 MG tablet Take 81 mg by mouth daily.     busPIRone (BUSPAR) 5 MG tablet TAKE 1 TABLET (5 MG TOTAL) BY MOUTH DAILY. 90 tablet 1   cholecalciferol (VITAMIN D) 1000 UNITS tablet Take 1,000 Units by mouth daily.      docusate sodium (COLACE) 100 MG capsule Take 100 mg by mouth 2 (two) times daily.     famotidine (PEPCID) 10 MG tablet Take 10 mg by mouth daily.     FLUoxetine (PROZAC) 20 MG capsule TAKE 1 CAPSULE BY MOUTH EVERY DAY 90 capsule 3   hydrocortisone 2.5 % cream      Multiple Vitamin (MULTIVITAMIN) tablet Take 1 tablet by mouth daily.     nitroGLYCERIN (NITROSTAT) 0.4 MG SL tablet Place 1 tablet (0.4 mg total) under the tongue every 5 (five) minutes as needed for chest pain. 28 tablet 1   nystatin (MYCOSTATIN/NYSTOP) powder Apply 1 application topically 3 (three) times daily. As needed 15 g 0   Omega-3 Fatty Acids (FISH OIL) 1000 MG CAPS Take 1 capsule by mouth daily.     Probiotic Product (PROBIOTIC-10 PO) Take by mouth.     simvastatin (ZOCOR) 40 MG tablet TAKE 1/2 TABLET BY MOUTH EVERY EVENING 45 tablet 3   Zinc 50 MG TABS Take by mouth.     propranolol ER (INDERAL LA) 160 MG SR capsule TAKE 1 CAPSULE BY MOUTH EVERY OTHER DAY 45 capsule 1   spironolactone (ALDACTONE) 50 MG tablet TAKE 1 TABLET BY MOUTH EVERY DAY 90 tablet 3   No facility-administered medications prior to visit.     Radiology:   No results found.  Cardiac Studies:    Echocardiogram 02/17/2016: Left ventricle cavity is normal in size. Mild concentric hypertrophy of the left ventricle. Normal global wall motion. Normal diastolic filling pattern. Calculated EF 72%. Left atrial cavity is normal in size. Aneurysmal interatrial septum with probable small incidental PFO. Trace mitral regurgitation. Trace tricuspid regurgitation. Unable to estimate PA pressure due to absence/minimal TR signal.   Exercise sestamibi stress test 02/21/2016: 1. Resting EKG demonstrates normal sinus rhythm, normal axis, nonspecific inferior and lateral ST segment depression with T-wave inversion.  Cannot exclude ischemia.  Stress EKG was equivocal for ischemia with PVCs noted during peak exercise but no additional ST-T wave changes of ischemia.  Patient exercised on a Bruce protocol for 4 minutes and 48 seconds and achieved 6.74 METS.  Stress terminated due to fatigue.  Normal blood pressure response.  Low exercise tolerance for age. 2. Raw images reveal prominent breast attenuation.  The perfusion imaging study demonstrates the left ventricle to be normal in both rest and stress images.  There is a small sized mild ischemia in the basal anterior and anteroseptal region and basal to mid inferior wall.  Left ventricular systolic function calculated QGS was mildly depressed at 47%. This is an intermediate risk study, clinical correlation recommended.   EKG   EKG 10/13/2020: Sinus rhythm at a rate of 62 bpm.  Normal axis.  Poor R wave progression, cannot exclude anteroseptal infarct old.  Nonspecific T wave abnormality.  Low voltage complexes. Compared to EKG 06/16/2020, no significant change.   EKG 12/03/2019: Normal sinus rhythm with rate of 72 bpm, normal axis, IVCD.  No evidence of ischemia.  Otherwise normal EKG.    09/20/2022: normal sinus rhythm, normal axis. No evidence of ischemia. No significant change  Assessment     ICD-10-CM   1. Essential hypertension  I10 EKG 12-Lead    2.  Abnormal EKG  R94.31 EKG 12-Lead    3. Morbid obesity (HCC)  E66.01        Medications Discontinued During This Encounter  Medication Reason   propranolol ER (INDERAL LA) 160 MG SR capsule    spironolactone (ALDACTONE) 50 MG tablet Reorder    Meds ordered this encounter  Medications   Semaglutide, 1 MG/DOSE, 4 MG/3ML SOPN    Sig: Inject 1 mg into the skin once a week.    Dispense:  3 mL    Refill:  6   spironolactone (ALDACTONE) 50 MG tablet    Sig: Take 1 tablet (50 mg total) by mouth daily.    Dispense:  90 tablet    Refill:  3   propranolol ER (INDERAL LA) 160 MG SR capsule    Sig: Take 1 capsule (160 mg total) by mouth daily.    Dispense:  90 capsule    Refill:  3    Recommendations:   Debbie Walter is a 42 y.o. AA female with hypertension, morbid obesity, insulin resistance, polycystic ovarian syndrome, and severe anxiety.  Essential hypertension Continue current cardiac medications. Refills sent to patient's pharmacy Encourage low-sodium diet, less than 2000 mg daily.    Morbid obesity ([place]) Patient wanting to start on Ozempic Prescription sent to pharmacy   Follow up in 6 months, sooner if needed, for hypertension, weight management.    Debbie Flock, DO, [facility] 09/25/2022, 12:46 PM Office: [phone]

## 2022-10-06 DIAGNOSIS — H16223 Keratoconjunctivitis sicca, not specified as Sjogren's, bilateral: Secondary | ICD-10-CM | POA: Diagnosis not present

## 2022-10-06 DIAGNOSIS — D3131 Benign neoplasm of right choroid: Secondary | ICD-10-CM | POA: Diagnosis not present

## 2022-10-18 ENCOUNTER — Ambulatory Visit (INDEPENDENT_AMBULATORY_CARE_PROVIDER_SITE_OTHER): Payer: Federal, State, Local not specified - PPO | Admitting: Nurse Practitioner

## 2022-10-18 ENCOUNTER — Encounter: Payer: Self-pay | Admitting: Nurse Practitioner

## 2022-10-18 VITALS — BP 132/88 | HR 67 | Ht 61.0 in | Wt 221.0 lb

## 2022-10-18 DIAGNOSIS — F419 Anxiety disorder, unspecified: Secondary | ICD-10-CM | POA: Diagnosis not present

## 2022-10-18 DIAGNOSIS — K219 Gastro-esophageal reflux disease without esophagitis: Secondary | ICD-10-CM

## 2022-10-18 DIAGNOSIS — E559 Vitamin D deficiency, unspecified: Secondary | ICD-10-CM | POA: Diagnosis not present

## 2022-10-18 DIAGNOSIS — F32A Depression, unspecified: Secondary | ICD-10-CM

## 2022-10-18 DIAGNOSIS — R7303 Prediabetes: Secondary | ICD-10-CM | POA: Diagnosis not present

## 2022-10-18 DIAGNOSIS — Z0000 Encounter for general adult medical examination without abnormal findings: Secondary | ICD-10-CM | POA: Insufficient documentation

## 2022-10-18 DIAGNOSIS — I10 Essential (primary) hypertension: Secondary | ICD-10-CM | POA: Diagnosis not present

## 2022-10-18 DIAGNOSIS — E7800 Pure hypercholesterolemia, unspecified: Secondary | ICD-10-CM

## 2022-10-18 DIAGNOSIS — R21 Rash and other nonspecific skin eruption: Secondary | ICD-10-CM

## 2022-10-18 DIAGNOSIS — E042 Nontoxic multinodular goiter: Secondary | ICD-10-CM

## 2022-10-18 LAB — POCT GLYCOSYLATED HEMOGLOBIN (HGB A1C): Hemoglobin A1C: 6.2 % — AB (ref 4.0–5.6)

## 2022-10-18 MED ORDER — ALPRAZOLAM 0.25 MG PO TABS: 0.2500 mg | ORAL_TABLET | Freq: Every day | ORAL | 0 refills | Status: DC | PRN

## 2022-10-18 MED ORDER — HYDROCORTISONE 2.5 % EX CREA: TOPICAL_CREAM | Freq: Two times a day (BID) | CUTANEOUS | 0 refills | Status: DC

## 2022-10-18 NOTE — Assessment & Plan Note (Signed)
Discussed age-appropriate immunizations and screening exams.  Patient is up-to-date on Tdap, flu.  She is up-to-date on Pap smears and mammograms.  She is too young for CRC will start screening at age 42.  Patient was given information in regards to preventative healthcare maintenance and with disparate guidance for her age range.

## 2022-10-18 NOTE — Assessment & Plan Note (Signed)
Pending vitamin D level in office

## 2022-10-18 NOTE — Patient Instructions (Signed)
Nice to see you today Follow up with me in 1 year, sooner if you need me I will be in touch with the labs once I have them Work on exercise the goal is 30 mins a day 5 days a week

## 2022-10-18 NOTE — Assessment & Plan Note (Signed)
Patient currently followed by cardiology and is maintained on spironolactone and propranolol.  Blood pressure within normal limits today.  Continue medication as prescribed follow-up with cardiology as recommended

## 2022-10-18 NOTE — Assessment & Plan Note (Signed)
Pending lipids continue working on healthy lifestyle modifications

## 2022-10-18 NOTE — Assessment & Plan Note (Signed)
Currently maintained on Pepcid.  Tolerating well.  No dietary modifications needed.  Continue

## 2022-10-18 NOTE — Progress Notes (Signed)
Established Patient Office Visit  Subjective   Patient ID: Debbie Walter, female    DOB: [birth year]  Age: 42 y.o. MRN: [id number]  Chief Complaint  Patient presents with   Annual Exam    HPI  for complete physical and follow up of chronic conditions.   HTN: followed by Cardiology . Dr. Collene Mares Custovic. Last office visit was 09/20/2022. Currently maintained on  spironolactone, propranolol. Has a machine but does not check it  GERD: states that she is on pepcid.  This manages her symptoms well.  She does not have to moderate fiber diet while on Pepcid  DM2: Last A1c was 6.3 and she was place on ozempic by her cardiologist.  Has not gotten Ozempic approved yet.  Patient has gained some weight since last office visit with me  Thyroid nodule: last [place] was 2017 that showed sub CM nodule and right thyroidectomy   GAD: currently on fluoxetine, alprazolam and Buspar once a day.  Patient's last alprazolam prescription was about 1 year ago Immunizations: -Tetanus: Completed in 2015 -Influenza: UTD -Shingles: Too young -Pneumonia: Too young   -HPV: Followed by GYN  Diet: Fair diet. States that she is eating 2 meals a day. That si not very healthy meals. States that she has increased her water. Ginger ale and once in a while a pepsi Exercise: No regular exercise. Up and down the stairs in her house. States 10 times a day   Eye exam: Completes annually. Last Wednesday. States they found a spot on retina.  They are referring her to a retina specialist Dental exam: Completes semi-annually   Pap Smear: [facility] 2019 Mammogram: [facility]  Colonoscopy: Too young  Lung Cancer Screening: NA  Dexa: NA  Sleep: states that she goes to bed around 1030 and gets up around 630. Feels rested most of the time. States that she does not snore    Review of Systems  Constitutional:  Negative for chills and fever.  Respiratory:   Negative for shortness of breath.   Cardiovascular:  Negative for chest pain and leg swelling.  Gastrointestinal:  Negative for abdominal pain, blood in stool, constipation, diarrhea, nausea and vomiting.       BM every other day   Genitourinary:  Negative for dysuria and hematuria.  Neurological:  Negative for tingling and headaches.  Psychiatric/Behavioral:  Negative for hallucinations and suicidal ideas.       Objective:     BP 132/88   Pulse 67   Ht '5\' 1"'$  (1.549 m)   Wt 221 lb (100.2 kg)   SpO2 96%   BMI 41.76 kg/m  BP Readings from Last 3 Encounters:  10/18/22 132/88  09/20/22 133/86  11/09/21 128/76   Wt Readings from Last 3 Encounters:  10/18/22 221 lb (100.2 kg)  09/20/22 221 lb (100.2 kg)  11/09/21 210 lb 7 oz (95.5 kg)      Physical Exam Vitals and nursing note reviewed.  Constitutional:      Appearance: Normal appearance. She is obese.  HENT:     Right Ear: Tympanic membrane, ear canal and external ear normal.     Left Ear: Tympanic membrane, ear canal and external ear normal.     Mouth/Throat:     Mouth: Mucous membranes are moist.     Pharynx: Oropharynx is clear.  Eyes:     Extraocular Movements: Extraocular movements intact.     Pupils: Pupils are equal,  round, and reactive to light.  Cardiovascular:     Rate and Rhythm: Normal rate and regular rhythm.     Pulses: Normal pulses.     Heart sounds: Normal heart sounds.  Pulmonary:     Effort: Pulmonary effort is normal.     Breath sounds: Normal breath sounds.  Abdominal:     General: Bowel sounds are normal. There is no distension.     Palpations: There is no mass.     Tenderness: There is no abdominal tenderness.     Hernia: No hernia is present.  Musculoskeletal:     Right lower leg: No edema.     Left lower leg: No edema.  Lymphadenopathy:     Cervical: No cervical adenopathy.  Skin:    General: Skin is warm.     Findings: Rash present.          Comments: Papular rash    Neurological:     General: No focal deficit present.     Mental Status: She is alert.     Deep Tendon Reflexes:     Reflex Scores:      Bicep reflexes are 2+ on the right side and 2+ on the left side.      Patellar reflexes are 2+ on the right side and 2+ on the left side.    Comments: Bilateral upper and lower extremity strength 5/5  Psychiatric:        Mood and Affect: Mood normal.        Behavior: Behavior normal.        Thought Content: Thought content normal.        Judgment: Judgment normal.      Results for orders placed or performed in visit on 10/18/22  HgB A1c  Result Value Ref Range   Hemoglobin A1C 6.2 (A) 4.0 - 5.6 %   HbA1c POC (<> result, manual entry)     HbA1c, POC (prediabetic range)     HbA1c, POC (controlled diabetic range)        The ASCVD Risk score (Arnett DK, et al., 2019) failed to calculate for the following reasons:   The valid total cholesterol range is 130 to 320 mg/dL    Assessment & Plan:   Problem List Items Addressed This Visit       Cardiovascular and Mediastinum   HTN (hypertension)    Patient currently followed by cardiology and is maintained on spironolactone and propranolol.  Blood pressure within normal limits today.  Continue medication as prescribed follow-up with cardiology as recommended      Relevant Orders   CBC   Comprehensive metabolic panel   TSH     Digestive   GERD (gastroesophageal reflux disease)    Currently maintained on Pepcid.  Tolerating well.  No dietary modifications needed.  Continue        Endocrine   Multiple thyroid nodules     Musculoskeletal and Integument   Rash    Rash to the right lower posterior distal calf.  Has responded well to hydrocortisone 2.5% cream in the past.  Did discuss topical steroid precautions in regards to thinning of the skin and lightening of the skin color.  Patient is using it more than 10 days in a row.  Also encourage patient to use Vaseline once rash is healed       Relevant Medications   hydrocortisone 2.5 % cream     Other   Anxiety and depression    Patient currently  maintained on fluoxetine 20 mg BuSpar 5 mg daily and alprazolam as needed.  She is doing well but as of late she wake up in a panic that took several hours for her to calm down.  Patient is requesting alprazolam refill.  30 tablets last patient 1 year we will oblige refill sent in      Relevant Medications   ALPRAZolam (XANAX) 0.25 MG tablet   HLD (hyperlipidemia)    Pending lipids continue working on healthy lifestyle modifications      Prediabetes    Patient's A1c went from 6.3-6.2.  She is to work on healthy lifestyle modifications.  Pending insurance approval for Ozempic through her cardiologist      Relevant Orders   HgB A1c (Completed)   Preventative health care - Primary    Discussed age-appropriate immunizations and screening exams.  Patient is up-to-date on Tdap, flu.  She is up-to-date on Pap smears and mammograms.  She is too young for CRC will start screening at age 88.  Patient was given information in regards to preventative healthcare maintenance and with disparate guidance for her age range.      Relevant Orders   CBC   Comprehensive metabolic panel   TSH   Lipid panel   Vitamin D deficiency    Pending vitamin D level in office      Relevant Orders   VITAMIN D 25 Hydroxy (Vit-D Deficiency, Fractures)    Return in about 1 year (around 10/19/2023) for CPE and Labs.    Romilda Garret, NP

## 2022-10-18 NOTE — Assessment & Plan Note (Signed)
Patient currently maintained on fluoxetine 20 mg BuSpar 5 mg daily and alprazolam as needed.  She is doing well but as of late she wake up in a panic that took several hours for her to calm down.  Patient is requesting alprazolam refill.  30 tablets last patient 1 year we will oblige refill sent in

## 2022-10-18 NOTE — Assessment & Plan Note (Signed)
Patient's A1c went from 6.3-6.2.  She is to work on healthy lifestyle modifications.  Pending insurance approval for Ozempic through her cardiologist

## 2022-10-18 NOTE — Assessment & Plan Note (Signed)
Rash to the right lower posterior distal calf.  Has responded well to hydrocortisone 2.5% cream in the past.  Did discuss topical steroid precautions in regards to thinning of the skin and lightening of the skin color.  Patient is using it more than 10 days in a row.  Also encourage patient to use Vaseline once rash is healed

## 2022-10-19 LAB — COMPREHENSIVE METABOLIC PANEL
ALT: 15 U/L (ref 0–35)
AST: 12 U/L (ref 0–37)
Albumin: 4.3 g/dL (ref 3.5–5.2)
Alkaline Phosphatase: 79 U/L (ref 39–117)
BUN: 11 mg/dL (ref 6–23)
CO2: 30 mEq/L (ref 19–32)
Calcium: 9.7 mg/dL (ref 8.4–10.5)
Chloride: 103 mEq/L (ref 96–112)
Creatinine, Ser: 0.73 mg/dL (ref 0.40–1.20)
GFR: 100.71 mL/min (ref >60.00)
Glucose, Bld: 105 mg/dL — ABNORMAL HIGH (ref 70–99)
Potassium: 4.1 mEq/L (ref 3.5–5.1)
Sodium: 140 mEq/L (ref 135–145)
Total Bilirubin: 0.3 mg/dL (ref 0.2–1.2)
Total Protein: 6.8 g/dL (ref 6.0–8.3)

## 2022-10-19 LAB — CBC
HCT: 37.8 % (ref 36.0–46.0)
Hemoglobin: 12.7 g/dL (ref 12.0–15.0)
MCHC: 33.5 g/dL (ref 30.0–36.0)
MCV: 83.9 fl (ref 78.0–100.0)
Platelets: 317 10*3/uL (ref 150.0–400.0)
RBC: 4.51 Mil/uL (ref 3.87–5.11)
RDW: 14.8 % (ref 11.5–15.5)
WBC: 9.2 10*3/uL (ref 4.0–10.5)

## 2022-10-19 LAB — LDL CHOLESTEROL, DIRECT: Direct LDL: 72 mg/dL

## 2022-10-19 LAB — LIPID PANEL
Cholesterol: 130 mg/dL (ref 0–200)
HDL: 35.5 mg/dL — ABNORMAL LOW
NonHDL: 94.24
Total CHOL/HDL Ratio: 4
Triglycerides: 227 mg/dL — ABNORMAL HIGH (ref 0.0–149.0)
VLDL: 45.4 mg/dL — ABNORMAL HIGH (ref 0.0–40.0)

## 2022-10-19 LAB — TSH: TSH: 1.77 u[IU]/mL (ref 0.35–5.50)

## 2022-10-19 LAB — VITAMIN D 25 HYDROXY (VIT D DEFICIENCY, FRACTURES): VITD: 44.79 ng/mL (ref 30.00–100.00)

## 2022-10-27 DIAGNOSIS — H43822 Vitreomacular adhesion, left eye: Secondary | ICD-10-CM | POA: Diagnosis not present

## 2022-10-27 DIAGNOSIS — D3121 Benign neoplasm of right retina: Secondary | ICD-10-CM | POA: Diagnosis not present

## 2022-10-30 ENCOUNTER — Other Ambulatory Visit: Payer: Self-pay | Admitting: Nurse Practitioner

## 2022-10-30 DIAGNOSIS — H16223 Keratoconjunctivitis sicca, not specified as Sjogren's, bilateral: Secondary | ICD-10-CM | POA: Diagnosis not present

## 2022-10-30 DIAGNOSIS — H0289 Other specified disorders of eyelid: Secondary | ICD-10-CM | POA: Diagnosis not present

## 2022-10-30 DIAGNOSIS — H0288B Meibomian gland dysfunction left eye, upper and lower eyelids: Secondary | ICD-10-CM | POA: Diagnosis not present

## 2022-10-30 DIAGNOSIS — H0288A Meibomian gland dysfunction right eye, upper and lower eyelids: Secondary | ICD-10-CM | POA: Diagnosis not present

## 2022-11-06 ENCOUNTER — Encounter: Payer: Self-pay | Admitting: Nurse Practitioner

## 2022-11-06 ENCOUNTER — Telehealth (INDEPENDENT_AMBULATORY_CARE_PROVIDER_SITE_OTHER): Payer: Federal, State, Local not specified - PPO | Admitting: Nurse Practitioner

## 2022-11-06 VITALS — BP 132/82 | Ht 61.0 in | Wt 221.0 lb

## 2022-11-06 DIAGNOSIS — L0291 Cutaneous abscess, unspecified: Secondary | ICD-10-CM | POA: Insufficient documentation

## 2022-11-06 MED ORDER — SULFAMETHOXAZOLE-TRIMETHOPRIM 800-160 MG PO TABS: 1.0000 | ORAL_TABLET | Freq: Two times a day (BID) | ORAL | 0 refills | Status: AC

## 2022-11-06 NOTE — Progress Notes (Signed)
Patient ID: Debbie Walter, female    DOB: [birth year], 42 y.o.   MRN: [id number]  Virtual visit completed through [employer], a video enabled telemedicine application. Due to national recommendations of social distancing due to COVID-19, a virtual visit is felt to be most appropriate for this patient at this time. Reviewed limitations, risks, security and privacy concerns of performing a virtual visit and the availability of in person appointments. I also reviewed that there may be a patient responsible charge related to this service. The patient agreed to proceed.   Patient location: home Provider location: [place] at [facility], office Persons participating in this virtual visit: patient, provider   If any vitals were documented, they were collected by patient at home unless specified below.    BP 132/82   Ht 5' 1"$  (1.549 m)   Wt 221 lb (100.2 kg)   BMI 41.76 kg/m    CC: Abscess Subjective:   HPI: Debbie Walter is a 42 y.o. female presenting on 11/06/2022 for Recurrent Skin Infections (Right butt cheek on the bottom. Size of silver dollar, and has heat in it.)   States that she noticed it on Thursday History of boils in the past States the right side of the bottom States there is heat and size of a silver dollar. No drainage or fevers Has done warm and hot bath soaks States that she does use some baking soda in her bath water   Relevant past medical, surgical, family and social history reviewed and updated as indicated. Interim medical history since our last visit reviewed. Allergies and medications reviewed and updated. Outpatient Medications Prior to Visit  Medication Sig Dispense Refill   ALPRAZolam (XANAX) 0.25 MG tablet Take 1 tablet (0.25 mg total) by mouth daily as needed for anxiety. 30 tablet 0   APPLE CIDER VINEGAR PO Take by mouth.     ascorbic acid (VITAMIN C) 100 MG tablet Take by mouth.     aspirin EC 81 MG tablet Take 81 mg by mouth daily.     busPIRone  (BUSPAR) 5 MG tablet TAKE 1 TABLET (5 MG TOTAL) BY MOUTH DAILY. 90 tablet 1   cholecalciferol (VITAMIN D) 1000 UNITS tablet Take 1,000 Units by mouth daily.      docusate sodium (COLACE) 100 MG capsule Take 100 mg by mouth 2 (two) times daily.     famotidine (PEPCID) 10 MG tablet Take 10 mg by mouth daily.     FLUoxetine (PROZAC) 20 MG capsule TAKE 1 CAPSULE BY MOUTH EVERY DAY 90 capsule 3   hydrocortisone 2.5 % cream Apply topically 2 (two) times daily. No more than 10 days of continued use 20 g 0   Loteprednol Etabonate 0.5 % GEL Apply to eye.     Multiple Vitamin (MULTIVITAMIN) tablet Take 1 tablet by mouth daily.     nitroGLYCERIN (NITROSTAT) 0.4 MG SL tablet Place 1 tablet (0.4 mg total) under the tongue every 5 (five) minutes as needed for chest pain. 28 tablet 1   nystatin (MYCOSTATIN/NYSTOP) powder APPLY 1 APPLICATION TOPICALLY 3 (THREE) TIMES DAILY. AS NEEDED 15 g 0   Omega-3 Fatty Acids (FISH OIL) 1000 MG CAPS Take 1 capsule by mouth daily.     Probiotic Product (PROBIOTIC-10 PO) Take by mouth.     progesterone (PROMETRIUM) 200 MG capsule Take 200 mg by mouth daily.     propranolol ER (INDERAL LA) 160 MG SR capsule Take 1 capsule (160 mg total) by mouth daily. 90 capsule  3   Semaglutide, 1 MG/DOSE, 4 MG/3ML SOPN Inject 1 mg into the skin once a week. 3 mL 6   simvastatin (ZOCOR) 40 MG tablet TAKE 1/2 TABLET BY MOUTH EVERY EVENING 45 tablet 3   spironolactone (ALDACTONE) 50 MG tablet Take 1 tablet (50 mg total) by mouth daily. 90 tablet 3   Zinc 50 MG TABS Take by mouth.     No facility-administered medications prior to visit.     Per HPI unless specifically indicated in ROS section below Review of Systems  Constitutional:  Negative for chills and fever.   Objective:  BP 132/82   Ht 5' 1"$  (1.549 m)   Wt 221 lb (100.2 kg)   BMI 41.76 kg/m   Wt Readings from Last 3 Encounters:  11/06/22 221 lb (100.2 kg)  10/18/22 221 lb (100.2 kg)  09/20/22 221 lb (100.2 kg)        Physical exam: Gen: alert, NAD, not ill appearing Pulm: speaks in complete sentences without increased work of breathing Psych: normal mood, normal thought content  Skin: area of reddened skin with opening in th center on the right inferior buttock.     Results for orders placed or performed in visit on 10/18/22  CBC  Result Value Ref Range   WBC 9.2 4.0 - 10.5 K/uL   RBC 4.51 3.87 - 5.11 Mil/uL   Platelets 317.0 150.0 - 400.0 K/uL   Hemoglobin 12.7 12.0 - 15.0 g/dL   HCT 37.8 36.0 - 46.0 %   MCV 83.9 78.0 - 100.0 fl   MCHC 33.5 30.0 - 36.0 g/dL   RDW 14.8 11.5 - 15.5 %  Comprehensive metabolic panel  Result Value Ref Range   Sodium 140 135 - 145 mEq/L   Potassium 4.1 3.5 - 5.1 mEq/L   Chloride 103 96 - 112 mEq/L   CO2 30 19 - 32 mEq/L   Glucose, Bld 105 (H) 70 - 99 mg/dL   BUN 11 6 - 23 mg/dL   Creatinine, Ser 0.73 0.40 - 1.20 mg/dL   Total Bilirubin 0.3 0.2 - 1.2 mg/dL   Alkaline Phosphatase 79 39 - 117 U/L   AST 12 0 - 37 U/L   ALT 15 0 - 35 U/L   Total Protein 6.8 6.0 - 8.3 g/dL   Albumin 4.3 3.5 - 5.2 g/dL   GFR 100.71 >60.00 mL/min   Calcium 9.7 8.4 - 10.5 mg/dL  TSH  Result Value Ref Range   TSH 1.77 0.35 - 5.50 uIU/mL  Lipid panel  Result Value Ref Range   Cholesterol 130 0 - 200 mg/dL   Triglycerides 227.0 (H) 0.0 - 149.0 mg/dL   HDL 35.50 (L) >39.00 mg/dL   VLDL 45.4 (H) 0.0 - 40.0 mg/dL   Total CHOL/HDL Ratio 4    NonHDL 94.24   VITAMIN D 25 Hydroxy (Vit-D Deficiency, Fractures)  Result Value Ref Range   VITD 44.79 30.00 - 100.00 ng/mL  LDL cholesterol, direct  Result Value Ref Range   Direct LDL 72.0 mg/dL  HgB A1c  Result Value Ref Range   Hemoglobin A1C 6.2 (A) 4.0 - 5.6 %   HbA1c POC (<> result, manual entry)     HbA1c, POC (prediabetic range)     HbA1c, POC (controlled diabetic range)     Assessment & Plan:   Abscess Assessment & Plan: Virtually does not like an abscess.  Patient is warm compresses as needed over-the-counter  analgesics for discomfort will put patient on Bactrim DS  1 tab twice daily for 7 days.  Did inform patient this may drain.  She will let me know if she has fevers or does not improve.  If either of those she will need an in person office visit  Orders: -     Sulfamethoxazole-Trimethoprim; Take 1 tablet by mouth 2 (two) times daily for 7 days.  Dispense: 14 tablet; Refill: 0     I discussed the assessment and treatment plan with the patient. The patient was provided an opportunity to ask questions and all were answered. The patient agreed with the plan and demonstrated an understanding of the instructions. The patient was advised to call back or seek an in-person evaluation if the symptoms worsen or if the condition fails to improve as anticipated.  Follow up plan: Return if symptoms worsen or fail to improve.  Romilda Garret, NP

## 2022-11-06 NOTE — Patient Instructions (Signed)
Nice to see you today I have sent in an antibiotic to the pharmacy for you to take for 7 days You can use warm compresses to help with the discomfort. If you do not improve I will need to see you in office

## 2022-11-06 NOTE — Assessment & Plan Note (Signed)
Virtually does not like an abscess.  Patient is warm compresses as needed over-the-counter analgesics for discomfort will put patient on Bactrim DS 1 tab twice daily for 7 days.  Did inform patient this may drain.  She will let me know if she has fevers or does not improve.  If either of those she will need an in person office visit

## 2022-11-13 ENCOUNTER — Telehealth: Payer: Self-pay | Admitting: Nurse Practitioner

## 2022-11-13 NOTE — Telephone Encounter (Signed)
Scheduled ov for 11/15/22

## 2022-11-13 NOTE — Telephone Encounter (Signed)
Since I have seen her virtually and she is still having trouble I would like to see her in person

## 2022-11-13 NOTE — Telephone Encounter (Signed)
Pt called stating she saw Cable via mychart visit on 11/06/22 for her boil issues. Pt states the boil was swollen but started leaking. Pt states although its no longer swollen, it is now just a open wound that won't create a scab. Pt states she has been applying MediHoney & gauze on the boil. Pt is asking should a creme be prescribed or does she need another appt? Pt states she prefers virtual but if it needs to be in person, she'll come in. Call back # [phone]

## 2022-11-14 NOTE — Progress Notes (Unsigned)
    Debbie Vayda T. Josceline Chenard, MD, [place] at [facility] [name], [address]  Phone: [phone]  FAX: [place] - 42 y.o. female  MRN [id number]  Date of Birth: 1981/04/02  Date: 11/15/2022  PCP: Michela Pitcher, NP  Referral: Michela Pitcher, NP  No chief complaint on file.  Subjective:   Debbie Walter is a 42 y.o. very pleasant female patient with There is no height or weight on file to calculate BMI. who presents with the following:  She was diagnosed with an abscess through video visit on November 06, 2022, and she is here to follow-up after this in the office.  She did take Septra DS p.o. twice daily for 7 days.    Review of Systems is noted in the HPI, as appropriate  Objective:   There were no vitals taken for this visit.  GEN: No acute distress; alert,appropriate. PULM: Breathing comfortably in no respiratory distress PSYCH: Normally interactive.   Laboratory and Imaging Data:  Assessment and Plan:   ***

## 2022-11-15 ENCOUNTER — Ambulatory Visit: Payer: Federal, State, Local not specified - PPO | Admitting: Family Medicine

## 2022-11-15 ENCOUNTER — Encounter: Payer: Self-pay | Admitting: Family Medicine

## 2022-11-15 ENCOUNTER — Ambulatory Visit: Payer: Federal, State, Local not specified - PPO | Admitting: Nurse Practitioner

## 2022-11-15 VITALS — BP 112/60 | HR 91 | Temp 97.5°F | Ht 61.0 in | Wt 220.2 lb

## 2022-11-15 DIAGNOSIS — L0231 Cutaneous abscess of buttock: Secondary | ICD-10-CM | POA: Diagnosis not present

## 2022-11-15 DIAGNOSIS — L03317 Cellulitis of buttock: Secondary | ICD-10-CM | POA: Diagnosis not present

## 2022-12-24 DIAGNOSIS — B9689 Other specified bacterial agents as the cause of diseases classified elsewhere: Secondary | ICD-10-CM | POA: Diagnosis not present

## 2022-12-24 DIAGNOSIS — J019 Acute sinusitis, unspecified: Secondary | ICD-10-CM | POA: Diagnosis not present

## 2023-01-06 ENCOUNTER — Other Ambulatory Visit: Payer: Self-pay | Admitting: Nurse Practitioner

## 2023-01-18 ENCOUNTER — Telehealth: Payer: Self-pay | Admitting: Nurse Practitioner

## 2023-01-18 NOTE — Telephone Encounter (Signed)
Pt called in to make PCP aware that her FMLA paperwork has been fax to the office

## 2023-01-22 NOTE — Telephone Encounter (Signed)
Formed filled out, faxed, and copy made for pt in front office.

## 2023-01-22 NOTE — Telephone Encounter (Signed)
Form has been completed. Please review and fill in the office information. Then let the patient know that it has been completed

## 2023-01-25 ENCOUNTER — Telehealth (INDEPENDENT_AMBULATORY_CARE_PROVIDER_SITE_OTHER): Payer: Federal, State, Local not specified - PPO | Admitting: Nurse Practitioner

## 2023-01-25 ENCOUNTER — Encounter: Payer: Self-pay | Admitting: Nurse Practitioner

## 2023-01-25 VITALS — Ht 61.0 in | Wt 220.2 lb

## 2023-01-25 DIAGNOSIS — H00012 Hordeolum externum right lower eyelid: Secondary | ICD-10-CM | POA: Diagnosis not present

## 2023-01-25 MED ORDER — MOXIFLOXACIN HCL 0.5 % OP SOLN: 1.0000 [drp] | Freq: Three times a day (TID) | OPHTHALMIC | 0 refills | Status: DC

## 2023-01-25 NOTE — Progress Notes (Signed)
Ph: [phone]       Fax: [phone]   Patient ID: Debbie Walter, female    DOB: March 13, 1981, 42 y.o.   MRN: [id number]  Virtual visit completed through [employer], a video enabled telemedicine application. Due to national recommendations of social distancing due to COVID-19, a virtual visit is felt to be most appropriate for this patient at this time. Reviewed limitations, risks, security and privacy concerns of performing a virtual visit and the availability of in person appointments. I also reviewed that there may be a patient responsible charge related to this service. The patient agreed to proceed.   Patient location: home Provider location: [place] at [facility], office Persons participating in this virtual visit: patient, provider   If any vitals were documented, they were collected by patient at home unless specified below.    Ht 5\' 1"  (1.549 m)   Wt 220 lb 4 oz (99.9 kg)   BMI 41.62 kg/m    CC: Eye problems Subjective:   HPI: Debbie Walter is a 42 y.o. female presenting on 01/25/2023 for Stye (Right eye and left eye is itchy as well X 2 days)    Eye complaints with a history of HTN, GERD, DM2, Thryoid nodules  Symptoms started 2-3 days ago. States that she does have a history of styes. States that she started using a new lens cleaner [place]?  States that she has been using warm compresses migraine relief.  States she is very careful about touching her eyes.  Has a history of the same.  States she does have an eye doctor but no eye insurance currently and was unable to get in with them.  Has used steroid eyedrops in the past with good relief.  Patient does state that it feels gritty and is having discharge throughout the day.  States she wakes up with crustiness around the eye and there is swelling   Relevant past medical, surgical, family and social history reviewed and updated as indicated. Interim medical history since our last visit reviewed. Allergies and  medications reviewed and updated. Outpatient Medications Prior to Visit  Medication Sig Dispense Refill   ALPRAZolam (XANAX) 0.25 MG tablet Take 1 tablet (0.25 mg total) by mouth daily as needed for anxiety. 30 tablet 0   APPLE CIDER VINEGAR PO Take by mouth.     ascorbic acid (VITAMIN C) 100 MG tablet Take by mouth.     aspirin EC 81 MG tablet Take 81 mg by mouth daily.     busPIRone (BUSPAR) 5 MG tablet TAKE 1 TABLET (5 MG TOTAL) BY MOUTH DAILY. 90 tablet 1   cholecalciferol (VITAMIN D) 1000 UNITS tablet Take 1,000 Units by mouth daily.      docusate sodium (COLACE) 100 MG capsule Take 100 mg by mouth 2 (two) times daily.     famotidine (PEPCID) 10 MG tablet Take 10 mg by mouth daily.     FLUoxetine (PROZAC) 20 MG capsule TAKE 1 CAPSULE BY MOUTH EVERY DAY 90 capsule 2   hydrocortisone 2.5 % cream Apply topically 2 (two) times daily. No more than 10 days of continued use 20 g 0   Loteprednol Etabonate 0.5 % GEL Apply to eye.     Multiple Vitamin (MULTIVITAMIN) tablet Take 1 tablet by mouth daily.     nitroGLYCERIN (NITROSTAT) 0.4 MG SL tablet Place 1 tablet (0.4 mg total) under the tongue every 5 (five) minutes as needed for chest pain. 28 tablet 1   nystatin (MYCOSTATIN/NYSTOP)  powder APPLY 1 APPLICATION TOPICALLY 3 (THREE) TIMES DAILY. AS NEEDED 15 g 0   Omega-3 Fatty Acids (FISH OIL) 1000 MG CAPS Take 1 capsule by mouth daily.     Probiotic Product (PROBIOTIC-10 PO) Take by mouth.     progesterone (PROMETRIUM) 200 MG capsule Take 200 mg by mouth daily.     propranolol ER (INDERAL LA) 160 MG SR capsule Take 1 capsule (160 mg total) by mouth daily. (Patient taking differently: Take 160 mg by mouth every other day.) 90 capsule 3   Semaglutide, 1 MG/DOSE, 4 MG/3ML SOPN Inject 1 mg into the skin once a week. 3 mL 6   simvastatin (ZOCOR) 40 MG tablet TAKE 1/2 TABLET BY MOUTH EVERY EVENING 45 tablet 3   Zinc 50 MG TABS Take by mouth.     spironolactone (ALDACTONE) 50 MG tablet Take 1 tablet (50  mg total) by mouth daily. 90 tablet 3   No facility-administered medications prior to visit.     Per HPI unless specifically indicated in ROS section below Review of Systems  Constitutional:  Negative for chills and fever.  Eyes:  Positive for pain and discharge. Negative for redness and itching.       Edema   Objective:  Ht 5\' 1"  (1.549 m)   Wt 220 lb 4 oz (99.9 kg)   BMI 41.62 kg/m   Wt Readings from Last 3 Encounters:  01/25/23 220 lb 4 oz (99.9 kg)  11/15/22 220 lb 4 oz (99.9 kg)  11/06/22 221 lb (100.2 kg)       Physical exam: Gen: alert, NAD, not ill appearing Pulm: speaks in complete sentences without increased work of breathing Psych: normal mood, normal thought content      Results for orders placed or performed in visit on 10/18/22  CBC  Result Value Ref Range   WBC 9.2 4.0 - 10.5 K/uL   RBC 4.51 3.87 - 5.11 Mil/uL   Platelets 317.0 150.0 - 400.0 K/uL   Hemoglobin 12.7 12.0 - 15.0 g/dL   HCT 16.1 09.6 - 04.5 %   MCV 83.9 78.0 - 100.0 fl   MCHC 33.5 30.0 - 36.0 g/dL   RDW 40.9 81.1 - 91.4 %  Comprehensive metabolic panel  Result Value Ref Range   Sodium 140 135 - 145 mEq/L   Potassium 4.1 3.5 - 5.1 mEq/L   Chloride 103 96 - 112 mEq/L   CO2 30 19 - 32 mEq/L   Glucose, Bld 105 (H) 70 - 99 mg/dL   BUN 11 6 - 23 mg/dL   Creatinine, Ser 7.82 0.40 - 1.20 mg/dL   Total Bilirubin 0.3 0.2 - 1.2 mg/dL   Alkaline Phosphatase 79 39 - 117 U/L   AST 12 0 - 37 U/L   ALT 15 0 - 35 U/L   Total Protein 6.8 6.0 - 8.3 g/dL   Albumin 4.3 3.5 - 5.2 g/dL   GFR 956.21 >30.86 mL/min   Calcium 9.7 8.4 - 10.5 mg/dL  TSH  Result Value Ref Range   TSH 1.77 0.35 - 5.50 uIU/mL  Lipid panel  Result Value Ref Range   Cholesterol 130 0 - 200 mg/dL   Triglycerides 578.4 (H) 0.0 - 149.0 mg/dL   HDL 69.62 (L) >95.28 mg/dL   VLDL 41.3 (H) 0.0 - 24.4 mg/dL   Total CHOL/HDL Ratio 4    NonHDL 94.24   VITAMIN D 25 Hydroxy (Vit-D Deficiency, Fractures)  Result Value Ref Range    VITD 44.79 30.00 -  100.00 ng/mL  LDL cholesterol, direct  Result Value Ref Range   Direct LDL 72.0 mg/dL  HgB Z6X  Result Value Ref Range   Hemoglobin A1C 6.2 (A) 4.0 - 5.6 %   HbA1c POC (<> result, manual entry)     HbA1c, POC (prediabetic range)     HbA1c, POC (controlled diabetic range)     Assessment & Plan:   Hordeolum externum of right lower eyelid Assessment & Plan: Will treat patient with Vigamox eyedrops for the next week.  Signs and symptoms reviewed when to seek urgent emergent healthcare.  She will follow-up if no improvement this will also cover the possibility of a bacterial conjunctivitis.  Patient to continue using warm compresses to the eye wash hands prior to touching her face.  Orders: -     Moxifloxacin HCl; Place 1 drop into the right eye 3 (three) times daily.  Dispense: 3 mL; Refill: 0     I discussed the assessment and treatment plan with the patient. The patient was provided an opportunity to ask questions and all were answered. The patient agreed with the plan and demonstrated an understanding of the instructions. The patient was advised to call back or seek an in-person evaluation if the symptoms worsen or if the condition fails to improve as anticipated.  Follow up plan: Return if symptoms worsen or fail to improve.  Audria Nine, NP

## 2023-01-25 NOTE — Assessment & Plan Note (Signed)
Will treat patient with Vigamox eyedrops for the next week.  Signs and symptoms reviewed when to seek urgent emergent healthcare.  She will follow-up if no improvement this will also cover the possibility of a bacterial conjunctivitis.  Patient to continue using warm compresses to the eye wash hands prior to touching her face.

## 2023-01-25 NOTE — Patient Instructions (Signed)
Nice to see you today. I have sent antibiotic drops to your pharmacy. If you do not improve follow-up in person.

## 2023-02-14 ENCOUNTER — Other Ambulatory Visit: Payer: Self-pay | Admitting: Nurse Practitioner

## 2023-02-14 DIAGNOSIS — H00012 Hordeolum externum right lower eyelid: Secondary | ICD-10-CM

## 2023-02-21 DIAGNOSIS — M65342 Trigger finger, left ring finger: Secondary | ICD-10-CM | POA: Diagnosis not present

## 2023-03-15 ENCOUNTER — Other Ambulatory Visit: Payer: Self-pay

## 2023-03-15 MED ORDER — SEMAGLUTIDE (1 MG/DOSE) 4 MG/3ML ~~LOC~~ SOPN: 1.0000 mg | PEN_INJECTOR | SUBCUTANEOUS | 6 refills | Status: DC

## 2023-03-16 ENCOUNTER — Other Ambulatory Visit: Payer: Self-pay

## 2023-03-16 MED ORDER — SEMAGLUTIDE (1 MG/DOSE) 4 MG/3ML ~~LOC~~ SOPN: 1.0000 mg | PEN_INJECTOR | SUBCUTANEOUS | 6 refills | Status: DC

## 2023-03-21 ENCOUNTER — Ambulatory Visit: Payer: Federal, State, Local not specified - PPO | Admitting: Cardiology

## 2023-03-29 ENCOUNTER — Other Ambulatory Visit: Payer: Self-pay | Admitting: Nurse Practitioner

## 2023-03-29 DIAGNOSIS — H00012 Hordeolum externum right lower eyelid: Secondary | ICD-10-CM

## 2023-04-02 ENCOUNTER — Other Ambulatory Visit: Payer: Self-pay

## 2023-04-04 ENCOUNTER — Ambulatory Visit: Payer: Federal, State, Local not specified - PPO | Admitting: Cardiology

## 2023-04-05 ENCOUNTER — Other Ambulatory Visit: Payer: Self-pay | Admitting: Nurse Practitioner

## 2023-06-04 ENCOUNTER — Telehealth: Payer: Federal, State, Local not specified - PPO | Admitting: Nurse Practitioner

## 2023-06-29 ENCOUNTER — Telehealth: Payer: Self-pay | Admitting: Nurse Practitioner

## 2023-06-29 NOTE — Telephone Encounter (Signed)
Patient called in to see if we received her [employer] paperwork that was sent over a week ago. I

## 2023-07-02 NOTE — Telephone Encounter (Signed)
Patient called in to follow up on this. She was trying to see if [facility] received her FMLA paperwork. She will have it faxed over again.

## 2023-07-03 NOTE — Telephone Encounter (Signed)
I looked through my paper inbox and did not see any forms for the patient

## 2023-07-04 NOTE — Telephone Encounter (Signed)
Left message to return call to our office.  Sending my chart message as well.

## 2023-07-04 NOTE — Telephone Encounter (Signed)
Patient called in returning a call she received. Relayed message below to patient. Gave her [place] email to have forms emailed if possible.

## 2023-07-05 NOTE — Telephone Encounter (Signed)
Ppw received. Dropped in PCP folder for review/sign.

## 2023-07-05 NOTE — Telephone Encounter (Signed)
PPW was emailed over, printed and placed in Matt's box up front.

## 2023-07-09 NOTE — Telephone Encounter (Signed)
Left voicemail for patient to call the office back.  Form has been completed and faxed.

## 2023-07-10 ENCOUNTER — Telehealth: Payer: Self-pay | Admitting: Nurse Practitioner

## 2023-07-10 NOTE — Telephone Encounter (Signed)
Pt called in and stated that she emailed in her FMLA paperwork and and it wasn't completed the start date and the end date was wrong section 5,6 wasn't filled out. pt would like a call back as soon as possible

## 2023-07-11 ENCOUNTER — Telehealth: Payer: Self-pay | Admitting: Cardiology

## 2023-07-11 ENCOUNTER — Other Ambulatory Visit: Payer: Self-pay

## 2023-07-11 DIAGNOSIS — Z01419 Encounter for gynecological examination (general) (routine) without abnormal findings: Secondary | ICD-10-CM | POA: Diagnosis not present

## 2023-07-11 DIAGNOSIS — Z124 Encounter for screening for malignant neoplasm of cervix: Secondary | ICD-10-CM | POA: Diagnosis not present

## 2023-07-11 DIAGNOSIS — Z1231 Encounter for screening mammogram for malignant neoplasm of breast: Secondary | ICD-10-CM | POA: Diagnosis not present

## 2023-07-11 MED ORDER — SIMVASTATIN 40 MG PO TABS: 20.0000 mg | ORAL_TABLET | Freq: Every evening | ORAL | 3 refills | Status: DC

## 2023-07-11 NOTE — Telephone Encounter (Signed)
*  STAT* If patient is at the pharmacy, call can be transferred to refill team.   1. Which medications need to be refilled? (please list name of each medication and dose if known)   simvastatin (ZOCOR) 40 MG tablet    2. Which pharmacy/location (including street and city if local pharmacy) is medication to be sent to? [id number] [place], [place] - [address]   3. Do they need a 30 day or 90 day supply? 90 pt is taking 1/2 day and is completely out

## 2023-07-11 NOTE — Telephone Encounter (Signed)
[facility] [address]. CGM

## 2023-07-11 NOTE — Telephone Encounter (Signed)
Contacted pt in regards to [facility] form. Pt stated that she needs section 5 and 6; (start and end date) to be completed. Once sections are complete. Form will be faxed.

## 2023-07-12 NOTE — Telephone Encounter (Signed)
Form has been completed.

## 2023-09-30 ENCOUNTER — Other Ambulatory Visit: Payer: Self-pay | Admitting: Internal Medicine

## 2023-10-12 DIAGNOSIS — M545 Low back pain, unspecified: Secondary | ICD-10-CM | POA: Diagnosis not present

## 2023-10-12 DIAGNOSIS — M533 Sacrococcygeal disorders, not elsewhere classified: Secondary | ICD-10-CM | POA: Diagnosis not present

## 2023-10-22 ENCOUNTER — Encounter: Payer: Federal, State, Local not specified - PPO | Admitting: Nurse Practitioner

## 2023-10-23 ENCOUNTER — Telehealth: Payer: Self-pay | Admitting: Nurse Practitioner

## 2023-10-23 ENCOUNTER — Other Ambulatory Visit: Payer: Self-pay | Admitting: Cardiology

## 2023-10-23 DIAGNOSIS — F32A Depression, unspecified: Secondary | ICD-10-CM

## 2023-10-23 DIAGNOSIS — E119 Type 2 diabetes mellitus without complications: Secondary | ICD-10-CM

## 2023-10-23 MED ORDER — SEMAGLUTIDE (1 MG/DOSE) 4 MG/3ML ~~LOC~~ SOPN: 1.0000 mg | PEN_INJECTOR | SUBCUTANEOUS | 6 refills | Status: DC

## 2023-10-23 MED ORDER — ALPRAZOLAM 0.25 MG PO TABS: 0.2500 mg | ORAL_TABLET | Freq: Every day | ORAL | 0 refills | Status: DC | PRN

## 2023-10-23 MED ORDER — SPIRONOLACTONE 50 MG PO TABS: 50.0000 mg | ORAL_TABLET | Freq: Every day | ORAL | 0 refills | Status: DC

## 2023-10-23 NOTE — Telephone Encounter (Signed)
I will provide the refill. She needs to keep her appointment as scheduled with me

## 2023-10-23 NOTE — Telephone Encounter (Signed)
*  STAT* If patient is at the pharmacy, call can be transferred to refill team.   1. Which medications need to be refilled? (please list name of each medication and dose if known)  spironolactone  (ALDACTONE ) 50 MG tablet (Expired)  Semaglutide , 1 MG/DOSE, 4 MG/3ML SOPN  2. Which pharmacy/location (including street and city if local pharmacy) is medication to be sent to? [id number] [place], [place] - [address] Phone: [phone]  Fax: [phone]     3. Do they need a 30 day or 90 day supply? 90

## 2023-10-23 NOTE — Telephone Encounter (Signed)
 Copied from CRM [id number]. Topic: Clinical - Prescription Issue >> Oct 23, 2023  8:16 AM [place] ORN wrote: Reason for CRM: ALPRAZolam  (XANAX ) 0.25 MG tablet  patients appt had to be rescheduled to 12/26/23 (patient is on the wait list) however, she usually has her xanax  refilled at her physicals 1 per year, she is almost out of her medication and is wanting to know if Dr. Wendee will giver her a courtesy refill.  Patient callback [phone]

## 2023-10-23 NOTE — Telephone Encounter (Signed)
Patient has been advised that her rx has been sent to the pharmacy and reiterated the importance of keeping her appointment

## 2023-10-23 NOTE — Addendum Note (Signed)
Addended by: Eden Emms on: 10/23/2023 12:46 PM   Modules accepted: Orders

## 2023-10-23 NOTE — Telephone Encounter (Signed)
Spoke to patient, would like to stay at 1 mg dose as she is tolerating that does well. BG under controlled and at goal weight.

## 2023-10-24 ENCOUNTER — Encounter: Payer: Federal, State, Local not specified - PPO | Admitting: Nurse Practitioner

## 2023-11-23 ENCOUNTER — Ambulatory Visit: Payer: Federal, State, Local not specified - PPO | Admitting: Cardiology

## 2023-12-10 ENCOUNTER — Other Ambulatory Visit: Payer: Self-pay | Admitting: Cardiology

## 2023-12-10 ENCOUNTER — Other Ambulatory Visit: Payer: Self-pay | Admitting: Nurse Practitioner

## 2023-12-13 ENCOUNTER — Emergency Department (HOSPITAL_BASED_OUTPATIENT_CLINIC_OR_DEPARTMENT_OTHER)
Admission: EM | Admit: 2023-12-13 | Discharge: 2023-12-13 | Disposition: A | Discharge status: Home / Self Care | Attending: Emergency Medicine | Admitting: Emergency Medicine

## 2023-12-13 ENCOUNTER — Emergency Department (HOSPITAL_BASED_OUTPATIENT_CLINIC_OR_DEPARTMENT_OTHER): Admitting: Radiology

## 2023-12-13 ENCOUNTER — Encounter (HOSPITAL_BASED_OUTPATIENT_CLINIC_OR_DEPARTMENT_OTHER): Payer: Self-pay | Admitting: Emergency Medicine

## 2023-12-13 ENCOUNTER — Emergency Department (HOSPITAL_BASED_OUTPATIENT_CLINIC_OR_DEPARTMENT_OTHER)

## 2023-12-13 ENCOUNTER — Other Ambulatory Visit: Payer: Self-pay

## 2023-12-13 DIAGNOSIS — Z7982 Long term (current) use of aspirin: Secondary | ICD-10-CM | POA: Insufficient documentation

## 2023-12-13 DIAGNOSIS — R1013 Epigastric pain: Secondary | ICD-10-CM | POA: Diagnosis not present

## 2023-12-13 DIAGNOSIS — Z79899 Other long term (current) drug therapy: Secondary | ICD-10-CM | POA: Insufficient documentation

## 2023-12-13 DIAGNOSIS — I10 Essential (primary) hypertension: Secondary | ICD-10-CM | POA: Insufficient documentation

## 2023-12-13 DIAGNOSIS — R932 Abnormal findings on diagnostic imaging of liver and biliary tract: Secondary | ICD-10-CM | POA: Diagnosis not present

## 2023-12-13 DIAGNOSIS — N281 Cyst of kidney, acquired: Secondary | ICD-10-CM | POA: Diagnosis not present

## 2023-12-13 DIAGNOSIS — R935 Abnormal findings on diagnostic imaging of other abdominal regions, including retroperitoneum: Secondary | ICD-10-CM | POA: Diagnosis not present

## 2023-12-13 DIAGNOSIS — R0600 Dyspnea, unspecified: Secondary | ICD-10-CM | POA: Diagnosis not present

## 2023-12-13 LAB — CBC
HCT: 42.4 % (ref 36.0–46.0)
Hemoglobin: 14.1 g/dL (ref 12.0–15.0)
MCH: 26.6 pg (ref 26.0–34.0)
MCHC: 33.3 g/dL (ref 30.0–36.0)
MCV: 79.8 fL — ABNORMAL LOW (ref 80.0–100.0)
Platelets: 296 10*3/uL (ref 150–400)
RBC: 5.31 MIL/uL — ABNORMAL HIGH (ref 3.87–5.11)
RDW: 14.3 % (ref 11.5–15.5)
WBC: 8.7 10*3/uL (ref 4.0–10.5)
nRBC: 0 % (ref 0.0–0.2)

## 2023-12-13 LAB — COMPREHENSIVE METABOLIC PANEL WITH GFR
ALT: 23 U/L (ref 0–44)
AST: 26 U/L (ref 15–41)
Albumin: 4.5 g/dL (ref 3.5–5.0)
Alkaline Phosphatase: 59 U/L (ref 38–126)
Anion gap: 9 (ref 5–15)
BUN: 12 mg/dL (ref 6–20)
CO2: 26 mmol/L (ref 22–32)
Calcium: 9.4 mg/dL (ref 8.9–10.3)
Chloride: 106 mmol/L (ref 98–111)
Creatinine, Ser: 0.79 mg/dL (ref 0.44–1.00)
GFR, Estimated: >60 mL/min (ref >60)
Glucose, Bld: 115 mg/dL — ABNORMAL HIGH (ref 70–99)
Potassium: 3.7 mmol/L (ref 3.5–5.1)
Sodium: 141 mmol/L (ref 135–145)
Total Bilirubin: 0.5 mg/dL (ref 0.0–1.2)
Total Protein: 7 g/dL (ref 6.5–8.1)

## 2023-12-13 LAB — URINALYSIS, ROUTINE W REFLEX MICROSCOPIC
Bilirubin Urine: NEGATIVE
Glucose, UA: NEGATIVE mg/dL
Ketones, ur: NEGATIVE mg/dL
Leukocytes,Ua: NEGATIVE
Nitrite: NEGATIVE
Protein, ur: NEGATIVE mg/dL
Specific Gravity, Urine: <1.005 — ABNORMAL LOW (ref 1.005–1.030)
pH: 5.5 (ref 5.0–8.0)

## 2023-12-13 LAB — LIPASE, BLOOD: Lipase: 131 U/L — ABNORMAL HIGH (ref 11–51)

## 2023-12-13 LAB — PREGNANCY, URINE: Preg Test, Ur: NEGATIVE

## 2023-12-13 LAB — TROPONIN I (HIGH SENSITIVITY)
Troponin I (High Sensitivity): <2 ng/L (ref <18)
Troponin I (High Sensitivity): <2 ng/L

## 2023-12-13 MED ORDER — ONDANSETRON HCL 4 MG/2ML IJ SOLN
4.0000 mg | Freq: Once | INTRAMUSCULAR | Status: AC
  Administered 2023-12-13: 4 mg via INTRAVENOUS
  Filled 2023-12-13: qty 2

## 2023-12-13 MED ORDER — FENTANYL CITRATE PF 50 MCG/ML IJ SOSY
50.0000 ug | PREFILLED_SYRINGE | Freq: Once | INTRAMUSCULAR | Status: AC
  Administered 2023-12-13: 50 ug via INTRAVENOUS
  Filled 2023-12-13: qty 1

## 2023-12-13 MED ORDER — FAMOTIDINE 20 MG PO TABS: 20.0000 mg | ORAL_TABLET | Freq: Every day | ORAL | 1 refills | Status: AC

## 2023-12-13 MED ORDER — IOHEXOL 300 MG/ML  SOLN
100.0000 mL | Freq: Once | INTRAMUSCULAR | Status: AC | PRN
  Administered 2023-12-13: 100 mL via INTRAVENOUS

## 2023-12-13 MED ORDER — LACTATED RINGERS IV BOLUS
2000.0000 mL | Freq: Once | INTRAVENOUS | Status: AC
  Administered 2023-12-13: 2000 mL via INTRAVENOUS

## 2023-12-13 MED ORDER — ONDANSETRON 4 MG PO TBDP: 4.0000 mg | ORAL_TABLET | Freq: Three times a day (TID) | ORAL | 0 refills | Status: DC | PRN

## 2023-12-13 MED ORDER — DICYCLOMINE HCL 20 MG PO TABS: 20.0000 mg | ORAL_TABLET | Freq: Two times a day (BID) | ORAL | 0 refills | Status: DC

## 2023-12-13 NOTE — ED Notes (Signed)
 Patient transported to CT

## 2023-12-13 NOTE — Discharge Instructions (Signed)
 Your workup was overall reassuring.  No concerning signs of your abdominal pain.  We did increase your Pepcid dose to 20 mg daily.  Follow-up with your primary care provider.  Return to the emergency department for any concerning symptoms.  Otherwise follow-up with your primary care provider.

## 2023-12-13 NOTE — ED Notes (Signed)
 RN reviewed discharge instructions with pt. Pt verbalized understanding and had no further questions. VSS upon discharge.

## 2023-12-13 NOTE — ED Triage Notes (Signed)
 Pt via pvo from home with epigastric pain this morning. She reports 1 epidsode of very sharp pain accompanied by diaphoresis, sob. It passed after 5 mins and she had a couple of other episodes that were not as bad. Pt went to UC and was sent to ED to be evaluated. Pt is on ozempic (put on by cardiologist) and is concerned for pancreas issues. Pt alert & oriented, nad noted.

## 2023-12-13 NOTE — ED Provider Notes (Signed)
 [place] EMERGENCY DEPARTMENT AT [facility] Provider Note   CSN: [id number] Arrival date & time: 12/13/23  1140     History  Chief Complaint  Patient presents with   Abdominal Pain    Debbie Walter is a 43 y.o. female.  43 year old female presents today for concern of epigastric abdominal pain.  This started this morning.  No prior history of pancreatitis.  No prior history of CAD.  She states that she is on Ozempic but no recent dose change.  She states she initially became diaphoretic along with this as well.  She states the subsequent episodes were not as bad.  The history is provided by the patient. No language interpreter was used.       Home Medications Prior to Admission medications   Medication Sig Start Date End Date Taking? Authorizing Provider  ALPRAZolam (XANAX) 0.25 MG tablet Take 1 tablet (0.25 mg total) by mouth daily as needed for anxiety. 10/23/23   Eden Emms, NP  APPLE CIDER VINEGAR PO Take by mouth.    [provider]  ascorbic acid (VITAMIN C) 100 MG tablet Take by mouth.    [provider]  aspirin EC 81 MG tablet Take 81 mg by mouth daily.    [provider]  busPIRone (BUSPAR) 5 MG tablet TAKE 1 TABLET (5 MG TOTAL) BY MOUTH DAILY. 12/10/23   Eden Emms, NP  cholecalciferol (VITAMIN D) 1000 UNITS tablet Take 1,000 Units by mouth daily.     [provider]  docusate sodium (COLACE) 100 MG capsule Take 100 mg by mouth 2 (two) times daily.    [provider]  famotidine (PEPCID) 10 MG tablet Take 10 mg by mouth daily.    [provider]  FLUoxetine (PROZAC) 20 MG capsule TAKE 1 CAPSULE BY MOUTH EVERY DAY 12/10/23   Eden Emms, NP  hydrocortisone 2.5 % cream Apply topically 2 (two) times daily. No more than 10 days of continued use 10/18/22   Eden Emms, NP  Loteprednol Etabonate 0.5 % GEL Apply to eye. 10/30/22   [provider]  moxifloxacin (VIGAMOX) 0.5 % ophthalmic  solution Place 1 drop into the right eye 3 (three) times daily. 01/25/23   Eden Emms, NP  Multiple Vitamin (MULTIVITAMIN) tablet Take 1 tablet by mouth daily.    [provider]  nitroGLYCERIN (NITROSTAT) 0.4 MG SL tablet Place 1 tablet (0.4 mg total) under the tongue every 5 (five) minutes as needed for chest pain. 11/09/21   Eden Emms, NP  nystatin (MYCOSTATIN/NYSTOP) powder APPLY 1 APPLICATION TOPICALLY 3 (THREE) TIMES DAILY. AS NEEDED 10/31/22   Eden Emms, NP  Omega-3 Fatty Acids (FISH OIL) 1000 MG CAPS Take 1 capsule by mouth daily.    [provider]  Probiotic Product (PROBIOTIC-10 PO) Take by mouth.    [provider]  progesterone (PROMETRIUM) 200 MG capsule Take 200 mg by mouth daily. 10/30/22   [provider]  propranolol ER (INDERAL LA) 160 MG SR capsule Take 1 capsule (160 mg total) by mouth daily. Patient taking differently: Take 160 mg by mouth every other day. 09/20/22   Custovic, Rozell Searing, DO  Semaglutide, 1 MG/DOSE, 4 MG/3ML SOPN Inject 1 mg into the skin once a week. 10/23/23   Yates Decamp, MD  simvastatin (ZOCOR) 40 MG tablet Take 0.5 tablets (20 mg total) by mouth every evening. 07/11/23   Yates Decamp, MD  spironolactone (ALDACTONE) 50 MG tablet Take 1  tablet (50 mg total) by mouth daily. Pt needs to keep upcoming appt in April to avoid missed dosage 12/10/23   Yates Decamp, MD  Zinc 50 MG TABS Take by mouth.    [provider]      Allergies    Procardia [nifedipine], Wellbutrin [bupropion], Labetalol, Hydromorphone, Lorazepam, Morphine and codeine, Sudafed [pseudoephedrine hcl], and Zithromax [azithromycin]    Review of Systems   Review of Systems  Constitutional:  Positive for diaphoresis. Negative for chills and fever.  Respiratory:  Negative for shortness of breath.   Gastrointestinal:  Positive for abdominal pain. Negative for nausea and vomiting.  Neurological:  Negative for speech difficulty and light-headedness.  All  other systems reviewed and are negative.   Physical Exam Updated Vital Signs BP 139/81 (BP Location: Right Arm)   Pulse 92   Temp 98.5 F (36.9 C)   Resp 20   Ht 5\' 1"  (1.549 m)   Wt 63 kg   LMP 11/13/2023 (Approximate)   SpO2 98%   BMI 26.26 kg/m  Physical Exam Vitals and nursing note reviewed.  Constitutional:      General: She is not in acute distress.    Appearance: Normal appearance. She is not ill-appearing.  HENT:     Head: Normocephalic and atraumatic.     Nose: Nose normal.  Eyes:     General: No scleral icterus.    Extraocular Movements: Extraocular movements intact.     Conjunctiva/sclera: Conjunctivae normal.  Cardiovascular:     Rate and Rhythm: Normal rate and regular rhythm.  Pulmonary:     Effort: Pulmonary effort is normal. No respiratory distress.     Breath sounds: Normal breath sounds. No wheezing or rales.  Abdominal:     General: There is no distension.     Tenderness: There is no abdominal tenderness. There is no guarding.  Musculoskeletal:        General: Normal range of motion.     Cervical back: Normal range of motion.  Skin:    General: Skin is warm and dry.  Neurological:     General: No focal deficit present.     Mental Status: She is alert. Mental status is at baseline.     ED Results / Procedures / Treatments   Labs (all labs ordered are listed, but only abnormal results are displayed) Labs Reviewed  LIPASE, BLOOD - Abnormal; Notable for the following components:      Result Value   Lipase 131 (*)    All other components within normal limits  COMPREHENSIVE METABOLIC PANEL WITH GFR - Abnormal; Notable for the following components:   Glucose, Bld 115 (*)    All other components within normal limits  CBC - Abnormal; Notable for the following components:   RBC 5.31 (*)    MCV 79.8 (*)    All other components within normal limits  URINALYSIS, ROUTINE W REFLEX MICROSCOPIC - Abnormal; Notable for the following components:   Color,  Urine COLORLESS (*)    Specific Gravity, Urine <1.005 (*)    Hgb urine dipstick SMALL (*)    Bacteria, UA RARE (*)    All other components within normal limits  PREGNANCY, URINE  TROPONIN I (HIGH SENSITIVITY)  TROPONIN I (HIGH SENSITIVITY)    EKG EKG Interpretation Date/Time:  Thursday December 13 2023 11:48:54 EDT Ventricular Rate:  90 PR Interval:  100 QRS Duration:  90 QT Interval:  340 QTC Calculation: 415 R Axis:   86  Text Interpretation: Sinus  rhythm with short PR T wave abnormality, consider inferolateral ischemia Abnormal ECG When compared with ECG of 10-Jun-2019 02:30, PREVIOUS ECG IS PRESENT Confirmed by Edwin Dada ([id number]) on 12/13/2023 2:22:20 PM  Radiology DG Chest 2 View Result Date: 12/13/2023 CLINICAL DATA:  Epigastric pain. EXAM: CHEST - 2 VIEW COMPARISON:  Chest radiograph dated 06/10/2019. FINDINGS: The heart size and mediastinal contours are within normal limits. No focal consolidation, pleural effusion, or pneumothorax. Surgical clips at the thoracic inlet. No acute osseous abnormality. IMPRESSION: No acute cardiopulmonary findings. Electronically Signed   By: Hart Robinsons M.D.   On: 12/13/2023 13:11    Procedures Procedures    Medications Ordered in ED Medications  ondansetron (ZOFRAN) injection 4 mg (4 mg Intravenous Given 12/13/23 1349)  fentaNYL (SUBLIMAZE) injection 50 mcg (50 mcg Intravenous Given 12/13/23 1350)  lactated ringers bolus 2,000 mL (2,000 mLs Intravenous New Bag/Given 12/13/23 1347)  iohexol (OMNIPAQUE) 300 MG/ML solution 100 mL (100 mLs Intravenous Contrast Given 12/13/23 1356)    ED Course/ Medical Decision Making/ A&P                                 Medical Decision Making Amount and/or Complexity of Data Reviewed Labs: ordered. Radiology: ordered.  Risk Prescription drug management.   Medical Decision Making / ED Course   This patient presents to the ED for concern of epigastric abdominal pain, this involves an extensive  number of treatment options, and is a complaint that carries with it a high risk of complications and morbidity.  The differential diagnosis includes pancreatitis, atypical ACS, colitis, diverticulitis, UTI, pyelonephritis  MDM: 43 year old female presents today for concern of epigastric abdominal pain.  Overall well-appearing.  Abdominal exam without tenderness palpation or distention.   Admission considered but will reevaluate after labs and imaging.  CBC without leukocytosis or anemia.  CMP with glucose of 115 otherwise without acute concern.  UA shows some hemoglobin otherwise without acute findings and no UTI.  Troponin undetectable.  Lipase 131.  No prior history of pancreatitis.  Has history of cholecystectomy.  Low heart score.  Low suspicion for ACS.  EKG without acute ischemic change.  Chest x-ray without acute cardiopulmonary process.  CT abdomen pelvis with contrast without acute intra-abdominal process.  Symptoms improved on reevaluation.  She does have history of acid reflux.  Will uptitrate Pepcid.  She will follow-up with her PCP.  She is otherwise stable for discharge.  Discharged in stable condition.  Return precaution discussed.  Patient voices understanding and is in agreement with plan.  Lab Tests: -I ordered, reviewed, and interpreted labs.   The pertinent results include:   Labs Reviewed  LIPASE, BLOOD - Abnormal; Notable for the following components:      Result Value   Lipase 131 (*)    All other components within normal limits  COMPREHENSIVE METABOLIC PANEL WITH GFR - Abnormal; Notable for the following components:   Glucose, Bld 115 (*)    All other components within normal limits  CBC - Abnormal; Notable for the following components:   RBC 5.31 (*)    MCV 79.8 (*)    All other components within normal limits  URINALYSIS, ROUTINE W REFLEX MICROSCOPIC - Abnormal; Notable for the following components:   Color, Urine COLORLESS (*)    Specific Gravity, Urine <1.005  (*)    Hgb urine dipstick SMALL (*)    Bacteria, UA RARE (*)  All other components within normal limits  PREGNANCY, URINE  TROPONIN I (HIGH SENSITIVITY)  TROPONIN I (HIGH SENSITIVITY)      EKG  EKG Interpretation Date/Time:  Thursday December 13 2023 11:48:54 EDT Ventricular Rate:  90 PR Interval:  100 QRS Duration:  90 QT Interval:  340 QTC Calculation: 415 R Axis:   86  Text Interpretation: Sinus rhythm with short PR T wave abnormality, consider inferolateral ischemia Abnormal ECG When compared with ECG of 10-Jun-2019 02:30, PREVIOUS ECG IS PRESENT Confirmed by Edwin Dada ([id number]) on 12/13/2023 2:22:20 PM         Imaging Studies ordered: I ordered imaging studies including chest x-ray, CT abdomen pelvis with contrast I independently visualized and interpreted imaging. I agree with the radiologist interpretation   Medicines ordered and prescription drug management: Meds ordered this encounter  Medications   ondansetron (ZOFRAN) injection 4 mg   fentaNYL (SUBLIMAZE) injection 50 mcg   lactated ringers bolus 2,000 mL   iohexol (OMNIPAQUE) 300 MG/ML solution 100 mL    -I have reviewed the patients home medicines and have made adjustments as needed  Reevaluation: After the interventions noted above, I reevaluated the patient and found that they have :improved  Co morbidities that complicate the patient evaluation  Past Medical History:  Diagnosis Date   Fibroids    GERD (gastroesophageal reflux disease)    takes protonix   History of kidney stones    Hypertension    MRSA infection    under arm   Panic attacks    PCOS (polycystic ovarian syndrome)    takes metformin for this      Dispostion: Discharged in stable condition.  Return precaution discussed.  Patient voices understanding and is in agreement with plan.  Final Clinical Impression(s) / ED Diagnoses Final diagnoses:  Epigastric pain    Rx / DC Orders ED Discharge Orders          Ordered     ondansetron (ZOFRAN-ODT) 4 MG disintegrating tablet  Every 8 hours PRN        12/13/23 1757    dicyclomine (BENTYL) 20 MG tablet  2 times daily        12/13/23 1757    famotidine (PEPCID) 20 MG tablet  Daily        12/13/23 1757              Marita Kansas, PA-C 12/13/23 1758    Franne Forts, DO 12/14/23 1544

## 2023-12-18 NOTE — Progress Notes (Unsigned)
 Cardiology Office Note    Patient Name: Debbie Walter Date of Encounter: 12/19/2023  Primary Care Provider:  Eden Emms, NP Primary Cardiologist:  None Primary Electrophysiologist: None   Past Medical History    Past Medical History:  Diagnosis Date   Fibroids    GERD (gastroesophageal reflux disease)    takes protonix   History of kidney stones    Hypertension    MRSA infection    under arm   Panic attacks    PCOS (polycystic ovarian syndrome)    takes metformin for this    History of Present Illness  Debbie Walter is a 43 y.o. female with a PMH of HTN, DM type II, HLD, PCOS, GERD, obesity, severe anxiety who was today for follow-up.  Debbie Walter was seen initially by Dr. Jacinto Halim in 2020 for evaluation of HTN.  She underwent an exercise stress test in 2017 that was equivocal for ischemia with PVCs noted.  2D echo was completed showing mild concentric LVH and EF of 72% with trace MR and trace TR.  She had significant weight loss and developed hypotension with hydralazine and valsartan being discontinued.  She was last seen on 07/11/2023 for follow-up.  She was continued on her current HTN regimen and was restarted on Ozempic.  She was seen in the urgent care on 12/13/2023 with complaint of severe epigastric pain and abdominal pain. CT of the abdomen was normal and patient was provided Pepcid and discharged in stable condition.  She had an EKG completed that showed no signs of acute schema changes.  Debbie Walter presents today for posthospital follow-up. She experienced an episode of epigastric pain last Thursday while getting ready for work. The pain lasted about five minutes and was not accompanied by sweating or shortness of breath. She had a couple more episodes later that morning, which were less severe. She took one Tums and then went to urgent care, where she was advised to go to the hospital due to concerns about her heart.  During today's visit she reports no chest pain or  equivalent.  Her blood pressure was stable at 127/80 and heart rate was 75 bpm. She is currently on Ozempic, which she credits for significant weight loss from 222.8 pounds to 137 pounds since starting it in July. She reports eating differently and working out, contributing to her weight loss. She is currently taking 1 mg of Ozempic every other week, as her husband administers the injections. She is currently on spironolactone and no longer takes propranolol. She also has a history of an equivocal stress test and an echocardiogram showing left ventricular hypertrophy but no significant valve problems. She experiences chronic anxiety and is on Prozac, receiving 30 tablets a year. She occasionally takes Xanax for panic attacks but has been feeling more nervous recently, possibly due to dicyclomine or Ozempic. Patient denies chest pain, palpitations, dyspnea, PND, orthopnea, nausea, vomiting, dizziness, syncope, edema, weight gain, or early satiety.  Discussed the use of AI scribe software for clinical note transcription with the patient, who gave verbal consent to proceed.  History of Present Illness   Review of Systems  Please see the history of present illness.    All other systems reviewed and are otherwise negative except as noted above.  Physical Exam    Wt Readings from Last 3 Encounters:  12/13/23 139 lb (63 kg)  01/25/23 220 lb 4 oz (99.9 kg)  11/15/22 220 lb 4 oz (99.9 kg)   ZO:XWRUE were  no vitals filed for this visit.,There is no height or weight on file to calculate BMI. GEN: Well nourished, well developed in no acute distress Neck: No JVD; No carotid bruits Pulmonary: Clear to auscultation without rales, wheezing or rhonchi  Cardiovascular: Normal rate. Regular rhythm. Normal S1. Normal S2.   Murmurs: There is no murmur.  ABDOMEN: Soft, non-tender, non-distended EXTREMITIES:  No edema; No deformity   EKG/LABS/ Recent Cardiac Studies   ECG personally reviewed by me today -sinus  rhythm with rate of 75 bpm and no acute changes with previous T wave inversion no longer evident in inferior leads  Risk Assessment/Calculations:          Lab Results  Component Value Date   WBC 8.7 12/13/2023   HGB 14.1 12/13/2023   HCT 42.4 12/13/2023   MCV 79.8 (L) 12/13/2023   PLT 296 12/13/2023   Lab Results  Component Value Date   CREATININE 0.79 12/13/2023   BUN 12 12/13/2023   NA 141 12/13/2023   K 3.7 12/13/2023   CL 106 12/13/2023   CO2 26 12/13/2023   Lab Results  Component Value Date   CHOL 130 10/18/2022   HDL 35.50 (L) 10/18/2022   LDLCALC 50 04/07/2020   LDLDIRECT 72.0 10/18/2022   TRIG 227.0 (H) 10/18/2022   CHOLHDL 4 10/18/2022    Lab Results  Component Value Date   HGBA1C 6.2 (A) 10/18/2022   Assessment & Plan    1.  Essential hypertension: -Patient's blood pressure today was stable at 127/80 -Patient recent discontinued propranolol due to dizziness -Continue spironolactone 50 mg daily  2.  Hyperlipidemia: -Patient's last LDL cholesterol was 72 at goal -Continue Zocor 40 mg daily  3.  DM type II: -Patient's hemoglobin A1c is currently 6.2 -Continue Ozempic as prescribed Significant weight loss achieved with lifestyle changes and Ozempic. BMI still high but managed effectively with diet and exercise. - Refill Ozempic 1 mg for one year to continue weight management.  4.  Constipation -Constipation reported as a side effect of Ozempic. Enemas and Linzess not recommended due to dehydration risk. - Recommend Metamucil or Benefiber to manage constipation. - Increase water intake to aid in bowel movements.  5. Chest Pain: Intermittent epigastric pain initially concerning for cardiac origin.  -Coronary CTA recommended due to previous equivocal stress test. - Order coronary CTA to rule out cardiac causes of chest pain. - Check lipase levels to monitor for any changes. - Continue current medications, including Ozempic, for weight management and  PCOS symptoms. -Check lipase today  Disposition: Follow-up with None or APP in 12 months   Signed, Napoleon Form, Leodis Rains, NP 12/19/2023, 1:48 PM [facility]

## 2023-12-19 ENCOUNTER — Encounter: Payer: Self-pay | Admitting: Nurse Practitioner

## 2023-12-19 ENCOUNTER — Ambulatory Visit: Payer: Federal, State, Local not specified - PPO | Attending: Nurse Practitioner | Admitting: Nurse Practitioner

## 2023-12-19 VITALS — BP 127/80 | HR 75 | Ht 61.0 in | Wt 137.6 lb

## 2023-12-19 DIAGNOSIS — E119 Type 2 diabetes mellitus without complications: Secondary | ICD-10-CM | POA: Diagnosis not present

## 2023-12-19 DIAGNOSIS — K219 Gastro-esophageal reflux disease without esophagitis: Secondary | ICD-10-CM | POA: Diagnosis not present

## 2023-12-19 DIAGNOSIS — I10 Essential (primary) hypertension: Secondary | ICD-10-CM

## 2023-12-19 DIAGNOSIS — Z79899 Other long term (current) drug therapy: Secondary | ICD-10-CM | POA: Diagnosis not present

## 2023-12-19 DIAGNOSIS — E785 Hyperlipidemia, unspecified: Secondary | ICD-10-CM | POA: Diagnosis not present

## 2023-12-19 DIAGNOSIS — R072 Precordial pain: Secondary | ICD-10-CM

## 2023-12-19 MED ORDER — METOPROLOL TARTRATE 50 MG PO TABS: ORAL_TABLET | ORAL | 0 refills | Status: DC

## 2023-12-19 MED ORDER — SEMAGLUTIDE (1 MG/DOSE) 4 MG/3ML ~~LOC~~ SOPN: 1.0000 mg | PEN_INJECTOR | SUBCUTANEOUS | 6 refills | Status: AC

## 2023-12-19 NOTE — Patient Instructions (Addendum)
 Medication Instructions:  Take one time dose of Metoprolol 50mg  2 hours prior to test Your physician recommends that you continue on your current medications as directed. Please refer to the Current Medication list given to you today.  *If you need a refill on your cardiac medications before your next appointment, please call your pharmacy*  Lab Work: TODAY-LIPASE If you have labs (blood work) drawn today and your tests are completely normal, you will receive your results only by: MyChart Message (if you have MyChart) OR A paper copy in the mail If you have any lab test that is abnormal or we need to change your treatment, we will call you to review the results.  Testing/Procedures: CORONARY CTA  Follow-Up: At [facility], you and your health needs are our priority.  As part of our continuing mission to provide you with exceptional heart care, our providers are all part of one team.  This team includes your primary Cardiologist (physician) and Advanced Practice Providers or APPs (Physician Assistants and Nurse Practitioners) who all work together to provide you with the care you need, when you need it.  Your next appointment:   12 month(s)  Provider:   Yates Decamp, MD  We recommend signing up for the patient portal called "MyChart".  Sign up information is provided on this After Visit Summary.  MyChart is used to connect with patients for Virtual Visits (Telemedicine).  Patients are able to view lab/test results, encounter notes, upcoming appointments, etc.  Non-urgent messages can be sent to your provider as well.   To learn more about what you can do with MyChart, go to [email or web address].   Other Instructions   Your cardiac CT will be scheduled at one of the below locations:   [facility] [address] [place], [place] [address] [phone]   If scheduled at [facility], please arrive at the [facility] and Children's Entrance (Entrance C2)  of [facility] 30 minutes prior to test start time. You can use the FREE valet parking offered at entrance C (encouraged to control the heart rate for the test)  Proceed to the [facility] Radiology Department (first floor) to check-in and test prep.  All radiology patients and guests should use entrance C2 at [facility], accessed from [facility], even though the hospital's physical address listed is [address].        Please follow these instructions carefully (unless otherwise directed):  An IV will be required for this test and Nitroglycerin will be given.  Hold all erectile dysfunction medications at least 3 days (72 hrs) prior to test. (Ie viagra, cialis, sildenafil, tadalafil, etc)   On the Night Before the Test: Be sure to Drink plenty of water. Do not consume any caffeinated/decaffeinated beverages or chocolate 12 hours prior to your test. Do not take any antihistamines 12 hours prior to your test.   On the Day of the Test: Drink plenty of water until 1 hour prior to the test. Do not eat any food 1 hour prior to test. You may take your regular medications prior to the test.  Take metoprolol (Lopressor) two hours prior to test. If you take Furosemide/Hydrochlorothiazide/Spironolactone/Chlorthalidone, please HOLD on the morning of the test. Patients who wear a continuous glucose monitor MUST remove the device prior to scanning. FEMALES- please wear underwire-free bra if available, avoid dresses & tight clothing       After the Test: Drink plenty of water. After receiving  IV contrast, you may experience a mild flushed feeling. This is normal. On occasion, you may experience a mild rash up to 24 hours after the test. This is not dangerous. If this occurs, you can take Benadryl 25 mg, Zyrtec, Claritin, or Allegra and increase your fluid intake. (Patients taking Tikosyn should avoid Benadryl, and may take Zyrtec, Claritin, or Allegra) If  you experience trouble breathing, this can be serious. If it is severe call [phone] IMMEDIATELY. If it is mild, please call our office.  We will call to schedule your test 2-4 weeks out understanding that some insurance companies will need an authorization prior to the service being performed.   For more information and frequently asked questions, please visit our website : [email or web address]  For non-scheduling related questions, please contact the cardiac imaging nurse navigator should you have any questions/concerns: Cardiac Imaging Nurse Navigators Direct Office Dial: [phone]   For scheduling needs, including cancellations and rescheduling, please call [place], [phone].       1st Floor: - Lobby - Registration  - Pharmacy  - Lab - Cafe  2nd Floor: - PV Lab - Diagnostic Testing (echo, CT, nuclear med)  3rd Floor: - Vacant  4th Floor: - TCTS (cardiothoracic surgery) - AFib Clinic - Structural Heart Clinic - Vascular Surgery  - Vascular Ultrasound  5th Floor: - HeartCare Cardiology (general and EP) - Clinical Pharmacy for coumadin, hypertension, lipid, weight-loss medications, and med management appointments    Valet parking services will be available as well.

## 2023-12-20 LAB — LIPASE: Lipase: 37 U/L (ref 14–72)

## 2023-12-26 ENCOUNTER — Ambulatory Visit (INDEPENDENT_AMBULATORY_CARE_PROVIDER_SITE_OTHER): Payer: Federal, State, Local not specified - PPO | Admitting: Nurse Practitioner

## 2023-12-26 ENCOUNTER — Encounter: Payer: Self-pay | Admitting: Nurse Practitioner

## 2023-12-26 VITALS — BP 110/62 | HR 67 | Temp 98.1°F | Ht 61.0 in | Wt 136.4 lb

## 2023-12-26 DIAGNOSIS — F419 Anxiety disorder, unspecified: Secondary | ICD-10-CM

## 2023-12-26 DIAGNOSIS — E559 Vitamin D deficiency, unspecified: Secondary | ICD-10-CM

## 2023-12-26 DIAGNOSIS — K5901 Slow transit constipation: Secondary | ICD-10-CM | POA: Diagnosis not present

## 2023-12-26 DIAGNOSIS — F411 Generalized anxiety disorder: Secondary | ICD-10-CM

## 2023-12-26 DIAGNOSIS — K219 Gastro-esophageal reflux disease without esophagitis: Secondary | ICD-10-CM | POA: Diagnosis not present

## 2023-12-26 DIAGNOSIS — Z0000 Encounter for general adult medical examination without abnormal findings: Secondary | ICD-10-CM | POA: Diagnosis not present

## 2023-12-26 DIAGNOSIS — F32A Depression, unspecified: Secondary | ICD-10-CM

## 2023-12-26 DIAGNOSIS — Z23 Encounter for immunization: Secondary | ICD-10-CM | POA: Diagnosis not present

## 2023-12-26 DIAGNOSIS — Z7985 Long-term (current) use of injectable non-insulin antidiabetic drugs: Secondary | ICD-10-CM | POA: Diagnosis not present

## 2023-12-26 DIAGNOSIS — E119 Type 2 diabetes mellitus without complications: Secondary | ICD-10-CM | POA: Diagnosis not present

## 2023-12-26 DIAGNOSIS — E785 Hyperlipidemia, unspecified: Secondary | ICD-10-CM

## 2023-12-26 DIAGNOSIS — I10 Essential (primary) hypertension: Secondary | ICD-10-CM | POA: Diagnosis not present

## 2023-12-26 DIAGNOSIS — F410 Panic disorder [episodic paroxysmal anxiety] without agoraphobia: Secondary | ICD-10-CM

## 2023-12-26 LAB — POCT GLYCOSYLATED HEMOGLOBIN (HGB A1C): Hemoglobin A1C: 5.2 % (ref 4.0–5.6)

## 2023-12-26 MED ORDER — ALPRAZOLAM 0.25 MG PO TABS: 0.2500 mg | ORAL_TABLET | Freq: Every day | ORAL | 0 refills | Status: DC | PRN

## 2023-12-26 NOTE — Assessment & Plan Note (Signed)
 History of the same.  Patient currently maintained on simvastatin 20 mg daily.  Pending lipid panel

## 2023-12-26 NOTE — Assessment & Plan Note (Signed)
 Age-appropriate immunizations and screening exams.  Did review patient's personal, surgical, social, family histories.  Patient is up-to-date on all age-appropriate vaccinations she would like.  Update tetanus vaccine today.  Patient declined pneumonia vaccine today.  Patient still too young for CRC screening but will be due towards of the year.  She will reach out to me to advise which clinic or provider she would like to see.  Patient is up-to-date on mammogram and cervical cancer screening through GYN.  Patient is too young for osteoporosis screening.  Patient was given information at discharge about preventative healthcare maintenance with anticipatory guidance

## 2023-12-26 NOTE — Assessment & Plan Note (Signed)
 History of the same.  Patient currently maintained on Pepcid 20 mg daily stable continue Pepcid 20 mg daily

## 2023-12-26 NOTE — Assessment & Plan Note (Signed)
 History of the same.  Patient's A1c has been below diabetic range for many years.  Patient currently on Ozempic 1 mg once weekly.  Patient has had dramatic weight loss with lifestyle modifications and medication use.  A1c well-controlled today.  Patient currently injecting Ozempic 1 mg every other week for maintenance continue.  This is managed by cardiology

## 2023-12-26 NOTE — Patient Instructions (Signed)
 Nice to see you today I will be in touch with the labs once I have them Follow up with me in 1 year, sooner if you need me

## 2023-12-26 NOTE — Progress Notes (Signed)
 Established Patient Office Visit  Subjective   Patient ID: Debbie Walter, female    DOB: 01-28-1981  Age: 43 y.o. MRN: [id number]  Chief Complaint  Patient presents with   Annual Exam    HPI  HTN:  spirolactone 50mg  daily and followed by cardiology.  Has ability to check blood pressure at home but does not  GERD: Pepcid 20mg  daily. She increased it to 20mg  and doing well on it.   DM2: hx of the same she was maintained on diet and exercise. Cardiology did want her to put her on ozempic but was not approved at the time. She is currently on ozempic every other week and has lost almost 100 pound    Anxiety/depression: on prozac and alprazolam and buspar daily as needed?  for complete physical and follow up of chronic conditions.  Immunizations: -Tetanus: Completed in 2015, update today -Influenza:  up to date -Shingles: too young  -Pneumonia: deferred  Diet: Fair diet. She is eating 1 meal a day total and she will snack that is healthier options. Water  Exercise: No regular exercise. She is doing 3-4 times a week. walking a day   Eye exam: . Needs updating. Lasix   Dental exam: Completes semi-annually    Colonoscopy: Due this year. She would like to have it done where her spouse got it done. She will mychart me Lung Cancer Screening: NA  Pap smear: [facility] last pap smear. Pelvic every year. States that she just had it this year or last year  Mammogram: up to date and normal   Dexa: too young  Sleep: goes to bed around 11 and will gets up around 6. Feels rested most times. He does not snore   Has tried metamucil, ducollax chewables. She has tried her family members linzess and fleets suppository.     Review of Systems  Constitutional:  Negative for chills and fever.  Respiratory:  Negative for shortness of breath.   Cardiovascular:  Positive for chest pain. Negative for leg swelling.  Gastrointestinal:  Negative for  abdominal pain, blood in stool, constipation, diarrhea, nausea and vomiting.       Bm once a week.  Genitourinary:  Negative for dysuria and hematuria.  Neurological:  Negative for tingling and headaches.  Psychiatric/Behavioral:  Negative for hallucinations and suicidal ideas.       Objective:     BP 110/62   Pulse 67   Temp 98.1 F (36.7 C) (Oral)   Ht 5\' 1"  (1.549 m)   Wt 136 lb 6.4 oz (61.9 kg)   LMP 12/17/2023 (Exact Date)   SpO2 98%   BMI 25.77 kg/m  BP Readings from Last 3 Encounters:  12/26/23 110/62  12/19/23 127/80  12/13/23 127/80   Wt Readings from Last 3 Encounters:  12/26/23 136 lb 6.4 oz (61.9 kg)  12/19/23 137 lb 9.6 oz (62.4 kg)  12/13/23 139 lb (63 kg)   SpO2 Readings from Last 3 Encounters:  12/26/23 98%  12/19/23 98%  12/13/23 100%      Physical Exam Vitals and nursing note reviewed.  Constitutional:      Appearance: Normal appearance.  HENT:     Right Ear: Tympanic membrane, ear canal and external ear normal.     Left Ear: Tympanic membrane, ear canal and external ear normal.     Mouth/Throat:     Mouth: Mucous membranes are moist.     Pharynx: Oropharynx is clear.  Eyes:  Extraocular Movements: Extraocular movements intact.     Pupils: Pupils are equal, round, and reactive to light.  Cardiovascular:     Rate and Rhythm: Normal rate and regular rhythm.     Pulses: Normal pulses.     Heart sounds: Normal heart sounds.  Pulmonary:     Effort: Pulmonary effort is normal.     Breath sounds: Normal breath sounds.  Abdominal:     General: Bowel sounds are normal. There is no distension.     Palpations: There is no mass.     Tenderness: There is no abdominal tenderness.     Hernia: No hernia is present.  Musculoskeletal:     Right lower leg: No edema.     Left lower leg: No edema.  Lymphadenopathy:     Cervical: No cervical adenopathy.  Skin:    General: Skin is warm.  Neurological:     General: No focal deficit present.      Mental Status: She is alert.     Deep Tendon Reflexes:     Reflex Scores:      Bicep reflexes are 2+ on the right side and 2+ on the left side.      Patellar reflexes are 2+ on the right side and 2+ on the left side.    Comments: Bilateral upper and lower extremity strength 5/5  Psychiatric:        Mood and Affect: Mood normal.        Behavior: Behavior normal.        Thought Content: Thought content normal.        Judgment: Judgment normal.      Results for orders placed or performed in visit on 12/26/23  POCT glycosylated hemoglobin (Hb A1C)  Result Value Ref Range   Hemoglobin A1C 5.2 4.0 - 5.6 %   HbA1c POC (<> result, manual entry)     HbA1c, POC (prediabetic range)     HbA1c, POC (controlled diabetic range)        The 10-year ASCVD risk score (Arnett DK, et al., 2019) is: 3.8%    Assessment & Plan:   Problem List Items Addressed This Visit       Cardiovascular and Mediastinum   HTN (hypertension)   Patient currently maintained on spironolactone 50 mg.  Patient is experiencing intermittent orthostatic hypotension.  Encouraged adequate hydration if patient still experiences lightheadedness she can cut the spironolactone in half to take 25 mg.      Relevant Orders   CBC   Comprehensive metabolic panel with GFR   TSH     Digestive   GERD (gastroesophageal reflux disease)   History of the same.  Patient currently maintained on Pepcid 20 mg daily stable continue Pepcid 20 mg daily      Slow transit constipation   Likely secondary to Ozempic use.  Patient has been using suppositories and stool softeners.  States that she takes 4 capfuls of MiraLAX she has diarrhea.  She has been using her mother's Linzess that seems to produce a bowel movement with a suppository.  Encourage patient use MiraLAX 1-3 times a day until bowel movement and then back down to once daily.  If she still experiences diarrhea she can cut down to every other day use with the MiraLAX.         Endocrine   Controlled type 2 diabetes mellitus without complication, without long-term current use of insulin (HCC)   History of the same.  Patient's A1c has been  below diabetic range for many years.  Patient currently on Ozempic 1 mg once weekly.  Patient has had dramatic weight loss with lifestyle modifications and medication use.  A1c well-controlled today.  Patient currently injecting Ozempic 1 mg every other week for maintenance continue.  This is managed by cardiology      Relevant Orders   POCT glycosylated hemoglobin (Hb A1C) (Completed)   Microalbumin / creatinine urine ratio     Other   Generalized anxiety disorder with panic attacks (Chronic)   Patient currently maintained on alprazolam 0.25 mg as needed, BuSpar 5 mg daily, Prozac 20 mg daily.  Patient has HI/SI/AVH.  Refill of alprazolam given today.  Continue BuSpar and Prozac as prescribed      Relevant Medications   ALPRAZolam (XANAX) 0.25 MG tablet   Anxiety and depression   Relevant Medications   ALPRAZolam (XANAX) 0.25 MG tablet   HLD (hyperlipidemia)   History of the same.  Patient currently maintained on simvastatin 20 mg daily.  Pending lipid panel      Relevant Orders   Lipid panel   Preventative health care - Primary   Age-appropriate immunizations and screening exams.  Did review patient's personal, surgical, social, family histories.  Patient is up-to-date on all age-appropriate vaccinations she would like.  Update tetanus vaccine today.  Patient declined pneumonia vaccine today.  Patient still too young for CRC screening but will be due towards of the year.  She will reach out to me to advise which clinic or provider she would like to see.  Patient is up-to-date on mammogram and cervical cancer screening through GYN.  Patient is too young for osteoporosis screening.  Patient was given information at discharge about preventative healthcare maintenance with anticipatory guidance      Vitamin D deficiency    History of same.  Pending vitamin D level today      Relevant Orders   VITAMIN D 25 Hydroxy (Vit-D Deficiency, Fractures)    Return in about 1 year (around 12/25/2024) for CPE and Labs.    Audria Nine, NP

## 2023-12-26 NOTE — Assessment & Plan Note (Signed)
History of same.  Pending vitamin D level today

## 2023-12-26 NOTE — Assessment & Plan Note (Signed)
 Patient currently maintained on spironolactone 50 mg.  Patient is experiencing intermittent orthostatic hypotension.  Encouraged adequate hydration if patient still experiences lightheadedness she can cut the spironolactone in half to take 25 mg.

## 2023-12-26 NOTE — Addendum Note (Signed)
 Addended by: Melina Copa on: 12/26/2023 03:25 PM   Modules accepted: Orders

## 2023-12-26 NOTE — Assessment & Plan Note (Signed)
 Patient currently maintained on alprazolam 0.25 mg as needed, BuSpar 5 mg daily, Prozac 20 mg daily.  Patient has HI/SI/AVH.  Refill of alprazolam given today.  Continue BuSpar and Prozac as prescribed

## 2023-12-26 NOTE — Assessment & Plan Note (Signed)
 Likely secondary to Ozempic use.  Patient has been using suppositories and stool softeners.  States that she takes 4 capfuls of MiraLAX she has diarrhea.  She has been using her mother's Linzess that seems to produce a bowel movement with a suppository.  Encourage patient use MiraLAX 1-3 times a day until bowel movement and then back down to once daily.  If she still experiences diarrhea she can cut down to every other day use with the MiraLAX.

## 2023-12-27 ENCOUNTER — Telehealth: Payer: Self-pay

## 2023-12-27 LAB — LIPID PANEL
Cholesterol: 103 mg/dL (ref 0–200)
HDL: 42.8 mg/dL (ref >39.00)
LDL Cholesterol: 39 mg/dL (ref 0–99)
NonHDL: 60.44
Total CHOL/HDL Ratio: 2
Triglycerides: 108 mg/dL (ref 0.0–149.0)
VLDL: 21.6 mg/dL (ref 0.0–40.0)

## 2023-12-27 LAB — COMPREHENSIVE METABOLIC PANEL WITH GFR
ALT: 12 U/L (ref 0–35)
AST: 11 U/L (ref 0–37)
Albumin: 4.4 g/dL (ref 3.5–5.2)
Alkaline Phosphatase: 50 U/L (ref 39–117)
BUN: 14 mg/dL (ref 6–23)
CO2: 26 meq/L (ref 19–32)
Calcium: 9.5 mg/dL (ref 8.4–10.5)
Chloride: 104 meq/L (ref 96–112)
Creatinine, Ser: 0.8 mg/dL (ref 0.40–1.20)
GFR: 89.48 mL/min (ref >60.00)
Glucose, Bld: 81 mg/dL (ref 70–99)
Potassium: 4 meq/L (ref 3.5–5.1)
Sodium: 140 meq/L (ref 135–145)
Total Bilirubin: 0.5 mg/dL (ref 0.2–1.2)
Total Protein: 6.5 g/dL (ref 6.0–8.3)

## 2023-12-27 LAB — CBC
HCT: 40.3 % (ref 36.0–46.0)
Hemoglobin: 13.3 g/dL (ref 12.0–15.0)
MCHC: 33 g/dL (ref 30.0–36.0)
MCV: 81.8 fl (ref 78.0–100.0)
Platelets: 297 10*3/uL (ref 150.0–400.0)
RBC: 4.92 Mil/uL (ref 3.87–5.11)
RDW: 15.5 % (ref 11.5–15.5)
WBC: 7.7 10*3/uL (ref 4.0–10.5)

## 2023-12-27 LAB — TSH: TSH: 1.35 u[IU]/mL (ref 0.35–5.50)

## 2023-12-27 LAB — VITAMIN D 25 HYDROXY (VIT D DEFICIENCY, FRACTURES): VITD: 74.04 ng/mL (ref 30.00–100.00)

## 2023-12-27 LAB — MICROALBUMIN / CREATININE URINE RATIO
Creatinine,U: 31.3 mg/dL
Microalb Creat Ratio: UNDETERMINED mg/g (ref 0.0–30.0)
Microalb, Ur: <0.7 mg/dL

## 2023-12-27 NOTE — Telephone Encounter (Signed)
 Forms completed and handed to [employer]

## 2023-12-27 NOTE — Telephone Encounter (Signed)
 Received FMLA extension for patient. Have placed in PCP box for review. Please advise on number 6. And return to [facility] for completion.

## 2023-12-28 ENCOUNTER — Other Ambulatory Visit: Payer: Self-pay

## 2023-12-28 ENCOUNTER — Encounter: Payer: Self-pay | Admitting: Nurse Practitioner

## 2023-12-28 DIAGNOSIS — E119 Type 2 diabetes mellitus without complications: Secondary | ICD-10-CM

## 2023-12-28 DIAGNOSIS — E785 Hyperlipidemia, unspecified: Secondary | ICD-10-CM

## 2023-12-28 DIAGNOSIS — K219 Gastro-esophageal reflux disease without esophagitis: Secondary | ICD-10-CM

## 2023-12-28 DIAGNOSIS — R072 Precordial pain: Secondary | ICD-10-CM

## 2023-12-28 DIAGNOSIS — R9431 Abnormal electrocardiogram [ECG] [EKG]: Secondary | ICD-10-CM

## 2023-12-28 DIAGNOSIS — I10 Essential (primary) hypertension: Secondary | ICD-10-CM

## 2023-12-28 NOTE — Telephone Encounter (Signed)
 Completed FMLA forms have been faxed and a voicemail was left with the patient asking her to callback in regards to mailing her a copy or picking it up at the office.

## 2023-12-31 NOTE — Telephone Encounter (Signed)
 Patient did not return call to our office, will send FMLA paperwork to address on file. Copy sent to scan.

## 2024-01-03 ENCOUNTER — Telehealth: Payer: Self-pay | Admitting: Nurse Practitioner

## 2024-01-03 NOTE — Telephone Encounter (Signed)
 Is this something you can help with   Copied from CRM [id number]. Topic: General - Other >> Jan 03, 2024  8:26 AM Martinique E wrote: Reason for CRM: Patient called in wanting a status of her FMLA paperwork. Relayed to patient the notes left in chart that the office did try contacting her, but she stated she did not receive any calls. Verified phone number with patient and she said that it is correct. Patient would like these forms faxed to the [facility] department. Patient did not know the fax number, but stated it should be on the forms.

## 2024-01-03 NOTE — Telephone Encounter (Signed)
 Called patient let know forms have been faxed per message. She will reach out if any questions.

## 2024-01-07 ENCOUNTER — Other Ambulatory Visit: Payer: Self-pay | Admitting: Nurse Practitioner

## 2024-01-25 ENCOUNTER — Telehealth: Payer: Self-pay | Admitting: Pharmacy Technician

## 2024-01-25 ENCOUNTER — Other Ambulatory Visit (HOSPITAL_COMMUNITY): Payer: Self-pay

## 2024-01-25 ENCOUNTER — Telehealth: Payer: Self-pay | Admitting: Cardiology

## 2024-01-25 ENCOUNTER — Other Ambulatory Visit: Payer: Self-pay

## 2024-01-25 DIAGNOSIS — E119 Type 2 diabetes mellitus without complications: Secondary | ICD-10-CM

## 2024-01-25 DIAGNOSIS — R072 Precordial pain: Secondary | ICD-10-CM

## 2024-01-25 DIAGNOSIS — E785 Hyperlipidemia, unspecified: Secondary | ICD-10-CM

## 2024-01-25 DIAGNOSIS — R9431 Abnormal electrocardiogram [ECG] [EKG]: Secondary | ICD-10-CM

## 2024-01-25 DIAGNOSIS — I10 Essential (primary) hypertension: Secondary | ICD-10-CM

## 2024-01-25 DIAGNOSIS — K219 Gastro-esophageal reflux disease without esophagitis: Secondary | ICD-10-CM

## 2024-01-25 NOTE — Telephone Encounter (Signed)
    Pt said she got a letter from the insurance saying her insurance said her prior auth expires 03/15/24. I made me note to follow up on closer to time since it did not need pa now

## 2024-01-25 NOTE — Telephone Encounter (Signed)
 Pt said she got a letter from the insurance saying her insurance said her prior auth expires 03/15/24. I made me note to follow up on closer to time since it did not need pa now

## 2024-01-25 NOTE — Telephone Encounter (Signed)
*  STAT* If patient is at the pharmacy, call can be transferred to refill team.   1. Which medications need to be refilled? (please list name of each medication and dose if known)   Semaglutide , 1 MG/DOSE, 4 MG/3ML SOPN   2. Would you like to learn more about the convenience, safety, & potential cost savings by using the [facility]?    3. Are you open to using the [facility] (Type [facility]. ).   4. Which pharmacy/location (including street and city if local pharmacy) is medication to be sent to?  [id number], [place] - [address]   5. Do they need a 30 day or 90 day supply?   Patient stated her insurance company is now requiring a prior authorization for her to get this medication.

## 2024-01-28 NOTE — Telephone Encounter (Signed)
 Pt said she got a letter from the insurance saying her insurance said her prior auth expires 03/15/24. I made me note to follow up on closer to time since it did not need pa now

## 2024-02-12 ENCOUNTER — Telehealth: Payer: Self-pay

## 2024-02-12 NOTE — Telephone Encounter (Signed)
 Contacted the patient and advised her to have a bmet lab drawn before her coronary cta on Friday 02/15/24.  Patient voiced understanding and states she will have labs completed tomorrow.

## 2024-02-13 ENCOUNTER — Other Ambulatory Visit: Payer: Self-pay | Admitting: Nurse Practitioner

## 2024-02-13 ENCOUNTER — Other Ambulatory Visit: Payer: Self-pay

## 2024-02-13 MED ORDER — SPIRONOLACTONE 50 MG PO TABS: 50.0000 mg | ORAL_TABLET | Freq: Every day | ORAL | 3 refills | Status: AC

## 2024-02-13 NOTE — Telephone Encounter (Signed)
 Returned call to pt, reviewed CT instructions and sent via mychart.  Aware they will reach out to her today/tomorrow to review again. Advised to call pre service center about copay. Patient verbalized understanding and agreeable to plan.

## 2024-02-13 NOTE — Telephone Encounter (Signed)
 Pt requesting to discuss meds regarding CT and also what her copay will be

## 2024-02-15 ENCOUNTER — Ambulatory Visit (HOSPITAL_COMMUNITY)
Admission: RE | Admit: 2024-02-15 | Source: Ambulatory Visit | Attending: Nurse Practitioner | Admitting: Nurse Practitioner

## 2024-02-18 ENCOUNTER — Other Ambulatory Visit (HOSPITAL_COMMUNITY): Payer: Self-pay

## 2024-02-18 NOTE — Telephone Encounter (Signed)
 Tried to do pa, still saying no pa needed. Will try later

## 2024-02-28 ENCOUNTER — Other Ambulatory Visit (HOSPITAL_COMMUNITY): Payer: Self-pay

## 2024-02-28 ENCOUNTER — Telehealth: Payer: Self-pay

## 2024-02-28 NOTE — Telephone Encounter (Signed)
 Still not letting pa process. Not in promtpa either. Too soon until 03/08/24 per test claim. Only one form coming up in cmm.

## 2024-02-28 NOTE — Telephone Encounter (Signed)
 Copied from CRM [id number]. Topic: General - Other >> Feb 28, 2024  1:42 PM Turkey A wrote: Reason for CRM: Patient was returning call to Nurse Agent called [employer] and Nurse was with a Patient -please call pt

## 2024-02-28 NOTE — Telephone Encounter (Signed)
 Left VM for patient to call back for Lab results.

## 2024-03-10 ENCOUNTER — Other Ambulatory Visit (HOSPITAL_COMMUNITY): Payer: Self-pay

## 2024-03-10 NOTE — Telephone Encounter (Signed)
 Lf 02/26/24 on insurance.   Still not going through on cmm

## 2024-04-10 ENCOUNTER — Other Ambulatory Visit (HOSPITAL_COMMUNITY): Payer: Self-pay

## 2024-04-10 ENCOUNTER — Telehealth: Payer: Self-pay | Admitting: Pharmacy Technician

## 2024-04-10 ENCOUNTER — Telehealth: Payer: Self-pay | Admitting: Cardiology

## 2024-04-10 NOTE — Telephone Encounter (Signed)
 Prior auth approved -new encounter made

## 2024-04-10 NOTE — Telephone Encounter (Signed)
*  STAT* If patient is at the pharmacy, call can be transferred to refill team.   1. Which medications need to be refilled? (please list name of each medication and dose if known)   Semaglutide , 1 MG/DOSE, 4 MG/3ML SOPN    2. Would you like to learn more about the convenience, safety, & potential cost savings by using the [facility]?   3. Are you open to using the [facility] (Type [facility]. ).  4. Which pharmacy/location (including street and city if local pharmacy) is medication to be sent to?  [id number], [place] - [address]    5. Do they need a 30 day or 90 day supply?   Patient stated she is almost out of this medication and will need prior authorization to get this medication refilled.

## 2024-04-10 NOTE — Telephone Encounter (Signed)
 Pharmacy Patient Advocate Encounter  Received notification from [facility] that Prior Authorization for ozempic  1mg  has been APPROVED from 04/10/24 to 04/10/25   PA #/Case ID/Reference #: [id number]

## 2024-05-05 ENCOUNTER — Telehealth: Payer: Self-pay

## 2024-05-05 NOTE — Telephone Encounter (Unsigned)
 Copied from CRM #[id number]. Topic: Clinical - Medical Advice >> May 05, 2024 11:35 AM Charolett L wrote: Reason for CRM: patient stated that she already has a mammo scheduled with her obgyn. She stated what she needs a referral for a Gastro doctor

## 2024-05-05 NOTE — Telephone Encounter (Signed)
 Called pt to schedule mammogram. Pt hung up the call.

## 2024-05-21 ENCOUNTER — Other Ambulatory Visit: Payer: Self-pay | Admitting: Nurse Practitioner

## 2024-05-21 ENCOUNTER — Other Ambulatory Visit: Payer: Self-pay | Admitting: Cardiology

## 2024-06-02 ENCOUNTER — Other Ambulatory Visit: Payer: Self-pay | Admitting: Nurse Practitioner

## 2024-06-05 ENCOUNTER — Telehealth: Payer: Self-pay | Admitting: Nurse Practitioner

## 2024-06-05 ENCOUNTER — Telehealth: Payer: Self-pay

## 2024-06-05 DIAGNOSIS — Z1211 Encounter for screening for malignant neoplasm of colon: Secondary | ICD-10-CM

## 2024-06-05 NOTE — Telephone Encounter (Signed)
 Copied from CRM [id number]. Topic: Clinical - Medication Question >> Jun 05, 2024  2:12 PM Debbie Walter wrote: Reason for CRM: Patient is requesting eye drops, she said she has a sty and she has had it before. Patient declined appt and is requesting just the eye drops. Patient can be reached at [phone].

## 2024-06-05 NOTE — Telephone Encounter (Signed)
 Copied from CRM [id number]. Topic: Referral - Request for Referral >> Jun 05, 2024  2:10 PM Mercedes MATSU wrote: Did the patient discuss referral with their provider in the last year? No (If No - schedule appointment) (If Yes - send message)  Appointment offered? No  Type of order/referral and detailed reason for visit: Colonoscopy referral  Preference of office, provider, location: In [place] (patient prefers female)  If referral order, have you been seen by this specialty before? No (If Yes, this issue or another issue? When? Where?  Can we respond through MyChart? No, [phone] please leave a message secure line patient works from home.

## 2024-06-05 NOTE — Telephone Encounter (Signed)
 Needs office visit for medications to be dispensed

## 2024-06-05 NOTE — Telephone Encounter (Signed)
 Error

## 2024-06-11 NOTE — Telephone Encounter (Signed)
 A referral needs to be placed - there is no referral currently in the system for this patient.   Thanks!

## 2024-06-16 NOTE — Addendum Note (Signed)
 Addended by: WENDEE LYNWOOD HERO on: 06/16/2024 07:15 AM   Modules accepted: Orders

## 2024-06-18 ENCOUNTER — Other Ambulatory Visit: Payer: Self-pay | Admitting: Nurse Practitioner

## 2024-07-08 ENCOUNTER — Telehealth: Payer: Self-pay | Admitting: Nurse Practitioner

## 2024-07-08 NOTE — Telephone Encounter (Signed)
 Called pt to let her know that we did not receive the fax for FMLA.  Pt states she will have it refaxed again tomorrow.

## 2024-07-08 NOTE — Telephone Encounter (Signed)
 Copied from CRM [id number]. Topic: General - Other >> Jul 08, 2024  8:05 AM Franky GRADE wrote: Reason for CRM: Patient is calling to verify that FMLA documents have been received by the office. Forms were faxed yesterday 07/07/2024.Please call patient to confirm.

## 2024-07-11 NOTE — Telephone Encounter (Signed)
 Checked in the office and do not see that we have received FMLA paperwork.  Mychart message sent to patient to be sure that employer is using the correct fax number.

## 2024-07-11 NOTE — Telephone Encounter (Unsigned)
 Copied from CRM [id number]. Topic: Clinical - Medical Advice >> Jul 10, 2024  4:25 PM Anairis L wrote: Reason for CRM: FMLA paperwork from Mrs[employer] jobs has been sent in twice, can you confirm if we have received them.    Mychart msg or phone call.

## 2024-07-15 NOTE — Telephone Encounter (Addendum)
 FMLA for Patient forms received for completion for patient. Patient has been informed that process may take up to 5 business days.  Employer Name [employer] Reason for being out: Anxiety  Any inpatient care:N/A Any Planned appointment? 4.10.26 Patient is requesting start date of 10.30.25 Patient is requesting end date of 4.30.26  Patient is requesting 2x per month for 1-2 days per episode  Fax number form should be sent to is [phone]  Blank copy of forms sent to scan as requested per note  Forms placed in providers box for review

## 2024-07-15 NOTE — Telephone Encounter (Unsigned)
 Copied from CRM [id number]. Topic: General - Other >> Jul 15, 2024  8:52 AM Thersia BROCKS wrote: Reason for CRM: Patient called back in wanting to know if the [facility] paperwork has been received, patient stated she has been faxing it over . Would like for someone to give her a callback on the confirmation if it has been received >> Jul 15, 2024 11:54 AM Corin V wrote: Patient is bringing a copy of her FMLA paperwork to be filled out. Please upload a blank copy of the paperwork to the chart so that there is a copy on file since there have been issues getting the paperwork faxed and that way if the FMLA needs to be extended again there is a copy readily available and no delay while waiting for the office to receive the paperwork via fax.

## 2024-07-15 NOTE — Telephone Encounter (Signed)
 Pt came by and dropped off her FMLA form, I handed it to [place]!

## 2024-07-17 DIAGNOSIS — Z0279 Encounter for issue of other medical certificate: Secondary | ICD-10-CM

## 2024-07-17 NOTE — Telephone Encounter (Unsigned)
 Copied from CRM [id number]. Topic: General - Other >> Jul 17, 2024 12:27 PM Pinkey ORN wrote: Reason for CRM: FMLA >> Jul 17, 2024 12:37 PM Pinkey ORN wrote: Patient called wanting to know if her FMLA documents have been faxed back over to employer.   CAL confirms that [employer] just received the documents today and that they will contact patient directly once its ready for pickup.  (Patient notified)   Patient is requesting that the office makes additional copies because there's been issues with having it faxed over. Patient is also requesting document is returned by faxed at the earliest possible to her employer, as it originally was supposed to be submitted today.

## 2024-07-17 NOTE — Telephone Encounter (Signed)
 Form has been signed and placed on [employer]

## 2024-07-17 NOTE — Telephone Encounter (Signed)
 Completed forms received and faxed to [employer] at [phone]  Copy sent to scan  Returned call to patient letting her know forms are complete and have been faxed  Copy for patient at the front desk, reached out via Mychart letting patient know they are ready.

## 2024-09-08 ENCOUNTER — Other Ambulatory Visit: Payer: Self-pay | Admitting: Nurse Practitioner

## 2024-09-08 DIAGNOSIS — K219 Gastro-esophageal reflux disease without esophagitis: Secondary | ICD-10-CM

## 2024-09-08 DIAGNOSIS — F419 Anxiety disorder, unspecified: Secondary | ICD-10-CM

## 2024-09-08 DIAGNOSIS — I10 Essential (primary) hypertension: Secondary | ICD-10-CM

## 2024-09-08 DIAGNOSIS — E119 Type 2 diabetes mellitus without complications: Secondary | ICD-10-CM

## 2024-09-08 DIAGNOSIS — R072 Precordial pain: Secondary | ICD-10-CM

## 2024-09-08 DIAGNOSIS — E785 Hyperlipidemia, unspecified: Secondary | ICD-10-CM

## 2024-09-23 ENCOUNTER — Encounter: Payer: Self-pay | Admitting: Internal Medicine

## 2024-10-03 ENCOUNTER — Ambulatory Visit

## 2024-10-03 VITALS — Ht 61.0 in | Wt 189.0 lb

## 2024-10-03 DIAGNOSIS — Z1211 Encounter for screening for malignant neoplasm of colon: Secondary | ICD-10-CM

## 2024-10-03 MED ORDER — NA SULFATE-K SULFATE-MG SULF 17.5-3.13-1.6 GM/177ML PO SOLN: 1.0000 | Freq: Once | ORAL | 0 refills | Status: AC

## 2024-10-03 NOTE — Progress Notes (Signed)

## 2024-10-17 ENCOUNTER — Ambulatory Visit: Admitting: Internal Medicine

## 2024-10-17 ENCOUNTER — Encounter: Payer: Self-pay | Admitting: Internal Medicine

## 2024-10-17 VITALS — BP 130/81 | HR 92 | Temp 97.7°F | Resp 12 | Ht 61.0 in | Wt 189.0 lb

## 2024-10-17 DIAGNOSIS — K648 Other hemorrhoids: Secondary | ICD-10-CM

## 2024-10-17 DIAGNOSIS — K635 Polyp of colon: Secondary | ICD-10-CM | POA: Diagnosis not present

## 2024-10-17 DIAGNOSIS — D122 Benign neoplasm of ascending colon: Secondary | ICD-10-CM

## 2024-10-17 DIAGNOSIS — Z800 Family history of malignant neoplasm of digestive organs: Secondary | ICD-10-CM | POA: Diagnosis not present

## 2024-10-17 DIAGNOSIS — Z1211 Encounter for screening for malignant neoplasm of colon: Secondary | ICD-10-CM

## 2024-10-17 DIAGNOSIS — D123 Benign neoplasm of transverse colon: Secondary | ICD-10-CM

## 2024-10-17 DIAGNOSIS — K6389 Other specified diseases of intestine: Secondary | ICD-10-CM | POA: Diagnosis not present

## 2024-10-17 MED ORDER — SODIUM CHLORIDE 0.9 % IV SOLN: 500.0000 mL | Freq: Once | INTRAVENOUS | Status: DC

## 2024-10-17 NOTE — Patient Instructions (Addendum)
 Resume previous diet Continue present medications Await pathology results Handouts/information given for polyps and hemorrhoids  YOU HAD AN ENDOSCOPIC PROCEDURE TODAY AT THE [facility]:   Refer to the procedure report that was given to you for any specific questions about what was found during the examination.  If the procedure report does not answer your questions, please call your gastroenterologist to clarify.  If you requested that your care partner not be given the details of your procedure findings, then the procedure report has been included in a sealed envelope for you to review at your convenience later.  YOU SHOULD EXPECT: Some feelings of bloating in the abdomen. Passage of more gas than usual.  Walking can help get rid of the air that was put into your GI tract during the procedure and reduce the bloating. If you had a lower endoscopy (such as a colonoscopy or flexible sigmoidoscopy) you may notice spotting of blood in your stool or on the toilet paper. If you underwent a bowel prep for your procedure, you may not have a normal bowel movement for a few days.  Please Note:  You might notice some irritation and congestion in your nose or some drainage.  This is from the oxygen used during your procedure.  There is no need for concern and it should clear up in a day or so.  SYMPTOMS TO REPORT IMMEDIATELY:  Following lower endoscopy (colonoscopy):  Excessive amounts of blood in the stool  Significant tenderness or worsening of abdominal pains  Swelling of the abdomen that is new, acute  Fever of 100F or higher  For urgent or emergent issues, a gastroenterologist can be reached at any hour by calling [phone]. Do not use MyChart messaging for urgent concerns.   DIET:  We do recommend a small meal at first, but then you may proceed to your regular diet.  Drink plenty of fluids but you should avoid alcoholic beverages for 24 hours.  ACTIVITY:  You should plan to take  it easy for the rest of today and you should NOT DRIVE or use heavy machinery until tomorrow (because of the sedation medicines used during the test).    FOLLOW UP: Our staff will call the number listed on your records the next business day following your procedure.  We will call around 7:15- 8:00 am to check on you and address any questions or concerns that you may have regarding the information given to you following your procedure. If we do not reach you, we will leave a message.     If any biopsies were taken you will be contacted by phone or by letter within the next 1-3 weeks.  Please call us  at [phone] if you have not heard about the biopsies in 3 weeks.    SIGNATURES/CONFIDENTIALITY: You and/or your care partner have signed paperwork which will be entered into your electronic medical record.  These signatures attest to the fact that that the information above on your After Visit Summary has been reviewed and is understood.  Full responsibility of the confidentiality of this discharge information lies with you and/or your care-partner.

## 2024-10-17 NOTE — Progress Notes (Signed)
 Pt's states no medical or surgical changes since previsit or office visit.

## 2024-10-17 NOTE — Op Note (Signed)
 [facility] Patient Name: Debbie Walter Procedure Date: 10/17/2024 7:57 AM MRN: [id number] Endoscopist: Rosario Estefana Kidney , , [id number] Age: 44 Referring MD:  Date of Birth: 04/16/1981 Gender: Female Account #: [id number] Procedure:                Colonoscopy Indications:              Screening patient at increased risk: Family history                            of 1st-degree relative with colorectal cancer at                            age 4 years (or older) Medicines:                Monitored Anesthesia Care Procedure:                Pre-Anesthesia Assessment:                           - Prior to the procedure, a History and Physical                            was performed, and patient medications and                            allergies were reviewed. The patient's tolerance of                            previous anesthesia was also reviewed. The risks                            and benefits of the procedure and the sedation                            options and risks were discussed with the patient.                            All questions were answered, and informed consent                            was obtained. Prior Anticoagulants: The patient has                            taken no anticoagulant or antiplatelet agents                            except for aspirin . ASA Grade Assessment: II - A                            patient with mild systemic disease. After reviewing                            the risks and benefits, the patient was deemed in  satisfactory condition to undergo the procedure.                           After obtaining informed consent, the colonoscope                            was passed under direct vision. Throughout the                            procedure, the patient's blood pressure, pulse, and                            oxygen saturations were monitored continuously. The                            CF [id number]  #[id number] was introduced through the anus                            and advanced to the the cecum, identified by                            appendiceal orifice and ileocecal valve. The                            colonoscopy was performed without difficulty. The                            patient tolerated the procedure well. The quality                            of the bowel preparation was excellent. The                            ileocecal valve, appendiceal orifice, and rectum                            were photographed. Scope In: 8:24:19 AM Scope Out: 8:44:13 AM Scope Withdrawal Time: 0 hours 18 minutes 2 seconds  Total Procedure Duration: 0 hours 19 minutes 54 seconds  Findings:                 Two sessile polyps were found in the transverse                            colon and ascending colon. The polyps were 3 to 5                            mm in size. These polyps were removed with a cold                            snare. Resection was complete, but the polyp tissue                            was only partially retrieved.  Non-bleeding internal hemorrhoids were found during                            retroflexion. Complications:            No immediate complications. Estimated Blood Loss:     Estimated blood loss was minimal. Impression:               - Two 3 to 5 mm polyps in the transverse colon and                            in the ascending colon, removed with a cold snare.                            Complete resection. Partial retrieval.                           - Non-bleeding internal hemorrhoids. Recommendation:           - Discharge patient to home (with escort).                           - Await pathology results.                           - The findings and recommendations were discussed                            with the patient. Dr Estefana Federico Rosario Estefana Federico,  10/17/2024 8:48:48 AM

## 2024-10-17 NOTE — Progress Notes (Signed)
 "   GASTROENTEROLOGY PROCEDURE H&P NOTE   Primary Care Physician: Wendee Lynwood HERO, NP    Reason for Procedure:   Colon cancer screening  Plan:    Colonoscopy  Patient is appropriate for endoscopic procedure(s) in the ambulatory ([facility]) setting.  The nature of the procedure, as well as the risks, benefits, and alternatives were carefully and thoroughly reviewed with the patient. Ample time for discussion and questions allowed. The patient understood, was satisfied, and agreed to proceed.     HPI: Debbie Walter is a 44 y.o. female who presents for colonoscopy for colon cancer screening. Denies blood in stools, changes in bowel habits, or unintentional weight loss. Father had rectal cancer in his 70s. This is her first colonoscopy.  Past Medical History:  Diagnosis Date   Depression    Fibroids    GERD (gastroesophageal reflux disease)    takes protonix    History of kidney stones    Hyperlipidemia    Hypertension    MRSA infection    under arm   Panic attacks    PCOS (polycystic ovarian syndrome)    takes metformin  for this   Thyroid  disease     Past Surgical History:  Procedure Laterality Date   CESAREAN SECTION     CESAREAN SECTION WITH BILATERAL TUBAL LIGATION Bilateral 05/15/2014   Procedure: CESAREAN SECTION WITH BILATERAL TUBAL LIGATION;  Surgeon: Rosaline LITTIE Cobble, MD;  Location: [facility];  Service: Obstetrics;  Laterality: Bilateral;  repeat  edc 06/10/14   CHOLECYSTECTOMY N/A 10/31/2017   Procedure: LAPAROSCOPIC CHOLECYSTECTOMY;  Surgeon: Vernetta Berg, MD;  Location: [facility] OR;  Service: General;  Laterality: N/A;   HYSTEROSCOPY     PARATHYROIDECTOMY     REMOVED PART OF THYROID     PARATHYROIDECTOMY      Prior to Admission medications  Medication Sig Start Date End Date Taking? Authorizing Provider  ALPRAZolam  (XANAX ) 0.25 MG tablet TAKE 1 TABLET BY MOUTH DAILY AS NEEDED FOR ANXIETY 09/08/24  Yes Wendee Lynwood HERO, NP  aspirin  EC 81 MG tablet Take 81 mg by mouth  daily.   Yes [provider]  busPIRone  (BUSPAR ) 5 MG tablet TAKE 1 TABLET (5 MG TOTAL) BY MOUTH DAILY. 06/19/24  Yes Wendee Lynwood HERO, NP  cholecalciferol  (VITAMIN D ) 1000 UNITS tablet Take 1,000 Units by mouth daily.    Yes [provider]  cyanocobalamin  (VITAMIN B12) 500 MCG tablet Take 1,000 mcg by mouth.   Yes [provider]  famotidine  (PEPCID ) 20 MG tablet Take 1 tablet (20 mg total) by mouth daily. 12/13/23  Yes Ali, Amjad, PA-C  FLUoxetine  (PROZAC ) 20 MG capsule TAKE 1 CAPSULE BY MOUTH EVERY DAY 02/14/24  Yes Wendee Lynwood HERO, NP  Lactobacillus (AZO VAGINAL HEALTH PROBIOTIC PO) Take by mouth.   Yes [provider]  Multiple Vitamin (MULTIVITAMIN) tablet Take 1 tablet by mouth daily.   Yes [provider]  Omega-3 Fatty Acids (FISH OIL) 1000 MG CAPS Take 1 capsule by mouth daily. Patient taking differently: Take 2,400 capsules by mouth daily.   Yes [provider]  Probiotic Product (PROBIOTIC-10 PO) Take by mouth.   Yes [provider]  simvastatin  (ZOCOR ) 40 MG tablet TAKE 0.5 TABLET (20 MG TOTAL) BY MOUTH EVERY EVENING 05/21/24  Yes Wyn Jackee VEAR Mickey., NP  spironolactone  (ALDACTONE ) 50 MG tablet Take 1 tablet (50 mg total) by mouth daily. 02/13/24  Yes Wyn Jackee VEAR Mickey., NP  dicyclomine  (BENTYL ) 20 MG tablet Take 1 tablet (20 mg total)  by mouth 2 (two) times daily. 12/13/23   Hildegard Loge, PA-C  ondansetron  (ZOFRAN -ODT) 4 MG disintegrating tablet Take 1 tablet (4 mg total) by mouth every 8 (eight) hours as needed for nausea or vomiting. 12/13/23   Hildegard, Amjad, PA-C  Semaglutide , 1 MG/DOSE, 4 MG/3ML SOPN Inject 1 mg into the skin once a week. 12/19/23   Wyn Jackee VEAR Mickey., NP    Current Outpatient Medications  Medication Sig Dispense Refill   ALPRAZolam  (XANAX ) 0.25 MG tablet TAKE 1 TABLET BY MOUTH DAILY AS NEEDED FOR ANXIETY 30 tablet 0   aspirin  EC 81 MG tablet Take 81 mg by mouth daily.     busPIRone  (BUSPAR ) 5 MG tablet TAKE 1  TABLET (5 MG TOTAL) BY MOUTH DAILY. 90 tablet 1   cholecalciferol  (VITAMIN D ) 1000 UNITS tablet Take 1,000 Units by mouth daily.      cyanocobalamin  (VITAMIN B12) 500 MCG tablet Take 1,000 mcg by mouth.     famotidine  (PEPCID ) 20 MG tablet Take 1 tablet (20 mg total) by mouth daily. 30 tablet 1   FLUoxetine  (PROZAC ) 20 MG capsule TAKE 1 CAPSULE BY MOUTH EVERY DAY 90 capsule 2   Lactobacillus (AZO VAGINAL HEALTH PROBIOTIC PO) Take by mouth.     Multiple Vitamin (MULTIVITAMIN) tablet Take 1 tablet by mouth daily.     Omega-3 Fatty Acids (FISH OIL) 1000 MG CAPS Take 1 capsule by mouth daily. (Patient taking differently: Take 2,400 capsules by mouth daily.)     Probiotic Product (PROBIOTIC-10 PO) Take by mouth.     simvastatin  (ZOCOR ) 40 MG tablet TAKE 0.5 TABLET (20 MG TOTAL) BY MOUTH EVERY EVENING 45 tablet 3   spironolactone  (ALDACTONE ) 50 MG tablet Take 1 tablet (50 mg total) by mouth daily. 90 tablet 3   dicyclomine  (BENTYL ) 20 MG tablet Take 1 tablet (20 mg total) by mouth 2 (two) times daily. 20 tablet 0   ondansetron  (ZOFRAN -ODT) 4 MG disintegrating tablet Take 1 tablet (4 mg total) by mouth every 8 (eight) hours as needed for nausea or vomiting. 20 tablet 0   Semaglutide , 1 MG/DOSE, 4 MG/3ML SOPN Inject 1 mg into the skin once a week. 3 mL 6   Current Facility-Administered Medications  Medication Dose Route Frequency Provider Last Rate Last Admin   0.9 %  sodium chloride  infusion  500 mL Intravenous Once Federico Rosario BROCKS, MD        Allergies as of 10/17/2024 - Review Complete 10/17/2024  Allergen Reaction Noted   Sudafed [pseudoephedrine hcl] Other (See Comments) 10/25/2017   Wellbutrin [bupropion] Other (See Comments) 02/10/2013   Hydromorphone  Other (See Comments) 10/25/2017   Labetalol Other (See Comments) 09/27/2011   Morphine  and codeine  Other (See Comments) 10/25/2017   Procardia [nifedipine] Other (See Comments) 09/27/2011   Zithromax [azithromycin] Other (See Comments)  09/27/2011   Lorazepam  Other (See Comments) 09/20/2022    Family History  Problem Relation Age of Onset   Hyperlipidemia Mother    Heart disease Mother    Diabetes Mother    Hypertension Mother    Breast cancer Mother    Kidney failure Mother    Rectal cancer Father    Hyperlipidemia Father    Hypertension Father    Cancer Father        rectal   Dementia Father    Hypertension Sister    Endometriosis Sister    Anesthesia problems Neg Hx    Hypotension Neg Hx    Malignant hyperthermia Neg Hx  Pseudochol deficiency Neg Hx    Colon cancer Neg Hx    Colon polyps Neg Hx    Esophageal cancer Neg Hx    Stomach cancer Neg Hx     Social History   Socioeconomic History   Marital status: Married    Spouse name: Adaira Centola   Number of children: 2   Years of education: Not on file   Highest education level: Not on file  Occupational History   Not on file  Tobacco Use   Smoking status: Never   Smokeless tobacco: Never  Vaping Use   Vaping status: Never Used  Substance and Sexual Activity   Alcohol use: Not Currently    Comment: Wine cooler 1 every 2 months   Drug use: No   Sexual activity: Yes    Birth control/protection: None  Other Topics Concern   Not on file  Social History Narrative   Chiquita JOHNICE Gills 5    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 [employer]   Social Drivers of Health   Tobacco Use: Low Risk (10/17/2024)   Patient History    Smoking Tobacco Use: Never    Smokeless Tobacco Use: Never    Passive Exposure: Not on file  Financial Resource Strain: Not on file  Food Insecurity: Not on file  Transportation Needs: Not on file  Physical Activity: Not on file  Stress: Not on file  Social Connections: Unknown (01/31/2022)   Received from [facility]   Social Network    Social Network: Not on file  Intimate Partner Violence: Unknown (12/23/2021)   Received from [facility]   HITS    Physically Hurt: Not on file    Insult or Talk Down To: Not on file     Threaten Physical Harm: Not on file    Scream or Curse: Not on file  Depression (PHQ2-9): Low Risk (12/26/2023)   Depression (PHQ2-9)    PHQ-2 Score: 3  Alcohol Screen: Not on file  Housing: Not on file  Utilities: Not on file  Health Literacy: Not on file    Physical Exam: Vital signs in last 24 hours: BP (!) 168/121   Pulse 94   Temp 97.7 F (36.5 C) (Temporal)   Ht 5' 1 (1.549 m)   Wt 189 lb (85.7 kg)   LMP 09/18/2024   SpO2 98%   BMI 35.71 kg/m  GEN: NAD EYE: Sclerae anicteric ENT: MMM CV: Non-tachycardic Pulm: No increased work of breathing GI: Soft, NT/ND NEURO:  Alert & Oriented   Estefana Kidney, MD [facility]  10/17/2024 8:10 AM  "

## 2024-10-17 NOTE — Progress Notes (Signed)
 Sedate, gd SR, tolerated procedure well, VSS, report to RN

## 2024-10-21 ENCOUNTER — Telehealth: Payer: Self-pay

## 2024-10-21 ENCOUNTER — Other Ambulatory Visit: Payer: Self-pay | Admitting: Obstetrics and Gynecology

## 2024-10-21 DIAGNOSIS — R928 Other abnormal and inconclusive findings on diagnostic imaging of breast: Secondary | ICD-10-CM

## 2024-10-21 NOTE — Telephone Encounter (Signed)
" °  Follow up Call-     10/17/2024    7:38 AM  Call back number  Post procedure Call Back phone  # [phone]  Permission to leave phone message Yes     Patient questions:  Do you have a fever, pain , or abdominal swelling? No. Pain Score  0 *  Have you tolerated food without any problems? Yes.    Have you been able to return to your normal activities? Yes.    Do you have any questions about your discharge instructions: Diet   No. Medications  No. Follow up visit  No.  Do you have questions or concerns about your Care? No.  Actions: * If pain score is 4 or above: No action needed, pain <4.   "

## 2024-10-22 ENCOUNTER — Inpatient Hospital Stay
Admission: RE | Admit: 2024-10-22 | Discharge: 2024-10-22 | Attending: Obstetrics and Gynecology | Admitting: Obstetrics and Gynecology

## 2024-10-22 ENCOUNTER — Ambulatory Visit: Payer: Self-pay | Admitting: Internal Medicine

## 2024-10-22 DIAGNOSIS — R928 Other abnormal and inconclusive findings on diagnostic imaging of breast: Secondary | ICD-10-CM

## 2024-10-22 LAB — SURGICAL PATHOLOGY

## 2024-12-26 ENCOUNTER — Encounter: Admitting: Nurse Practitioner
